# Patient Record
Sex: Male | Born: 1945 | Race: White | Hispanic: No | Marital: Married | Smoking: Former smoker
Health system: Southern US, Community
[De-identification: ages and names within clinical notes are randomized; demographics above are authoritative.]

## PROBLEM LIST (undated history)

## (undated) DIAGNOSIS — N433 Hydrocele, unspecified: Secondary | ICD-10-CM

## (undated) DIAGNOSIS — M1990 Unspecified osteoarthritis, unspecified site: Secondary | ICD-10-CM

## (undated) DIAGNOSIS — D469 Myelodysplastic syndrome, unspecified: Secondary | ICD-10-CM

## (undated) DIAGNOSIS — R011 Cardiac murmur, unspecified: Secondary | ICD-10-CM

## (undated) DIAGNOSIS — S3992XA Unspecified injury of lower back, initial encounter: Secondary | ICD-10-CM

## (undated) DIAGNOSIS — D649 Anemia, unspecified: Secondary | ICD-10-CM

## (undated) DIAGNOSIS — I872 Venous insufficiency (chronic) (peripheral): Secondary | ICD-10-CM

## (undated) HISTORY — PX: SINUS EXPLORATION: SHX5214

## (undated) HISTORY — DX: Venous insufficiency (chronic) (peripheral): I87.2

## (undated) HISTORY — DX: Hydrocele, unspecified: N43.3

## (undated) HISTORY — PX: HERNIA REPAIR: SHX51

---

## 2006-01-26 HISTORY — PX: SHOULDER ARTHROSCOPY: SHX128

## 2006-09-13 ENCOUNTER — Ambulatory Visit: Payer: Self-pay | Admitting: Family Medicine

## 2006-09-13 IMAGING — US US EXTREM LOW VENOUS*L*
1 series · 17 of 24 positions shown · non-contrast
Comparison: none

REASON FOR EXAM: Hard knot left calf, left leg swelling
                    Call report to: [[email or web address]]
COMMENTS:

[Series 1: us extrem low venous*left* · 17 of 35 slices shown]
[im 1/35]
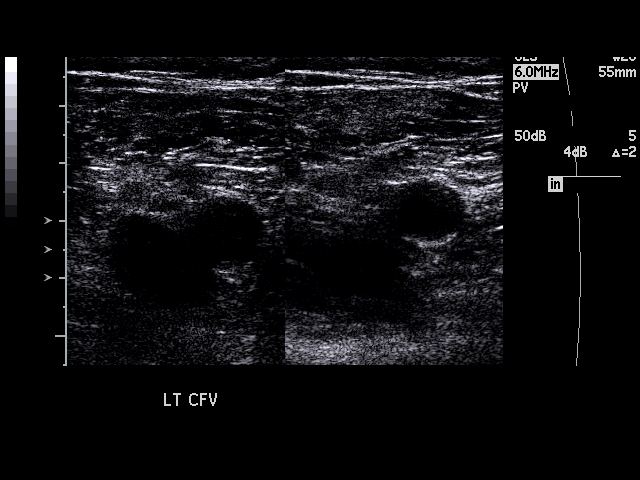
[im 3/35]
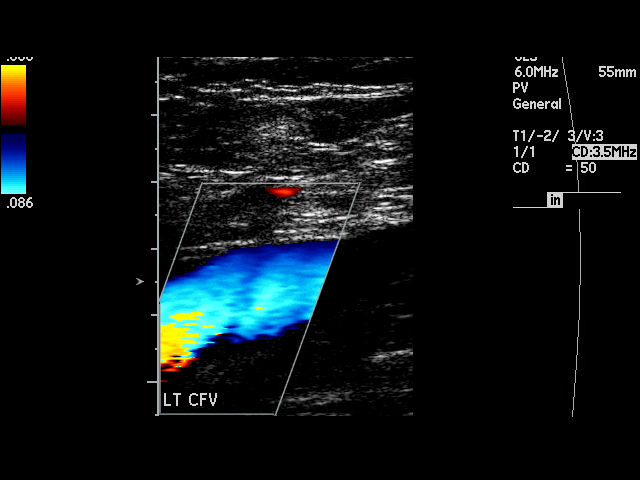
[im 5/35]
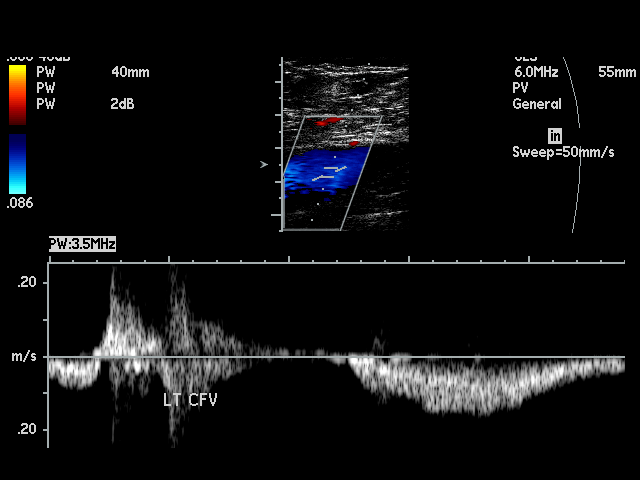
[im 6/35]
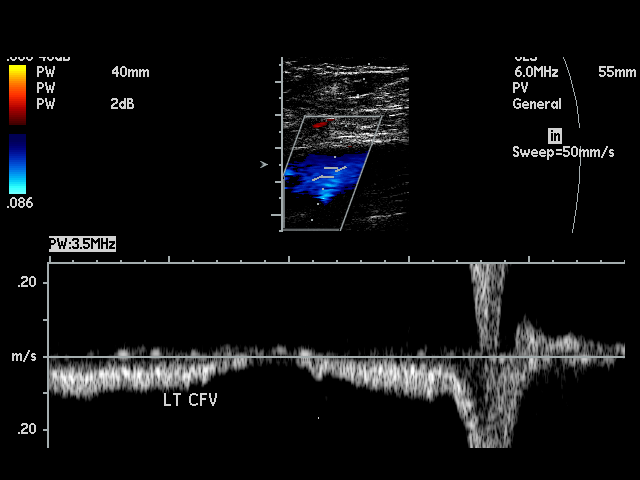
[im 9/35]
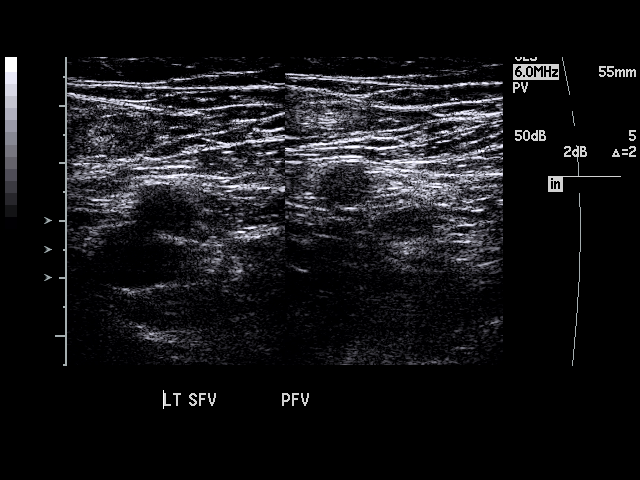
[im 11/35]
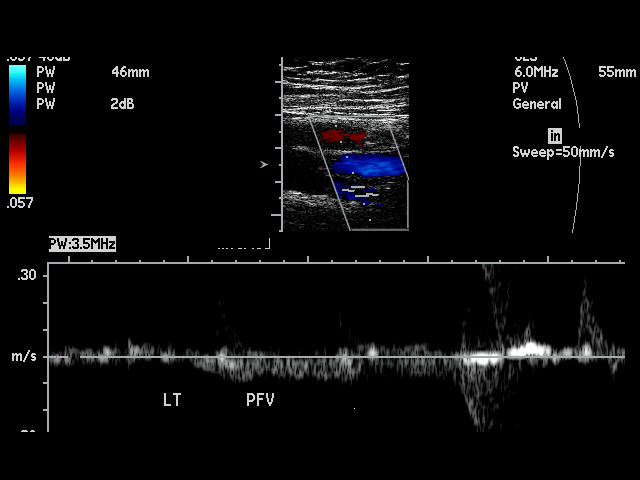
[im 14/35]
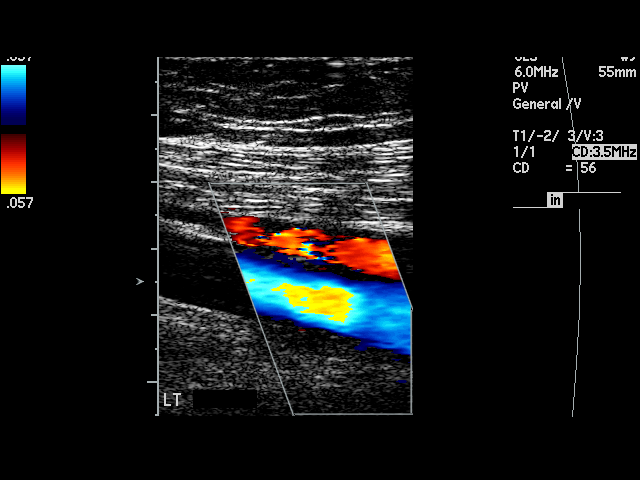
[im 15/35]
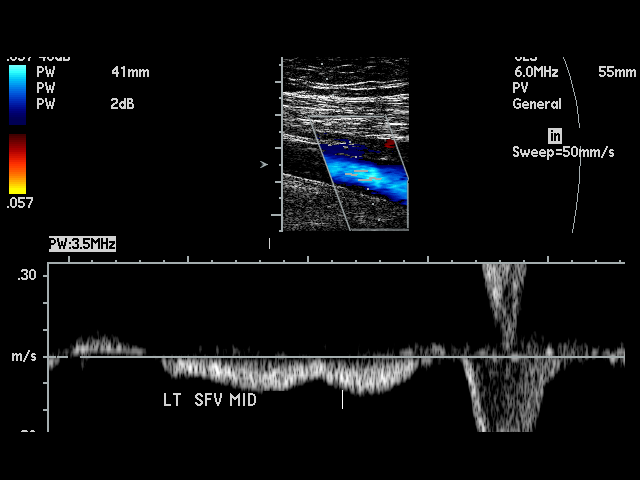
[im 18/35]
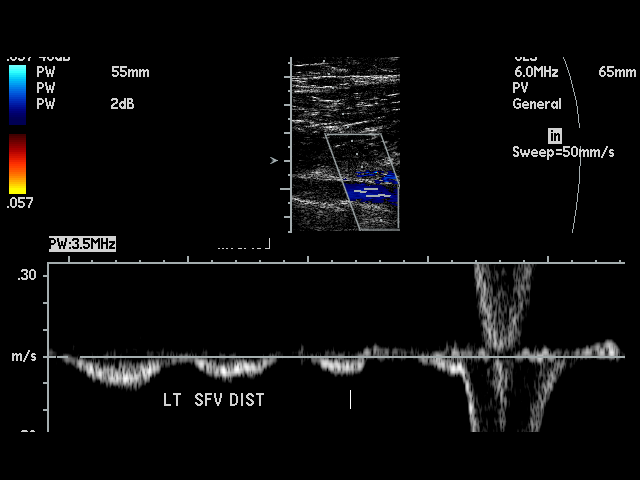
[im 20/35]
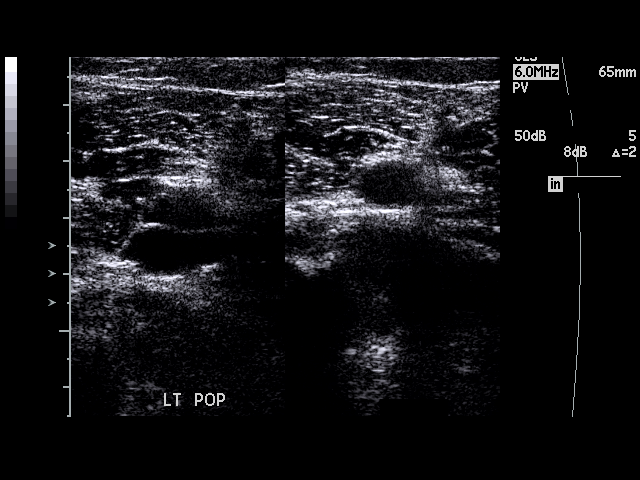
[im 21/35]
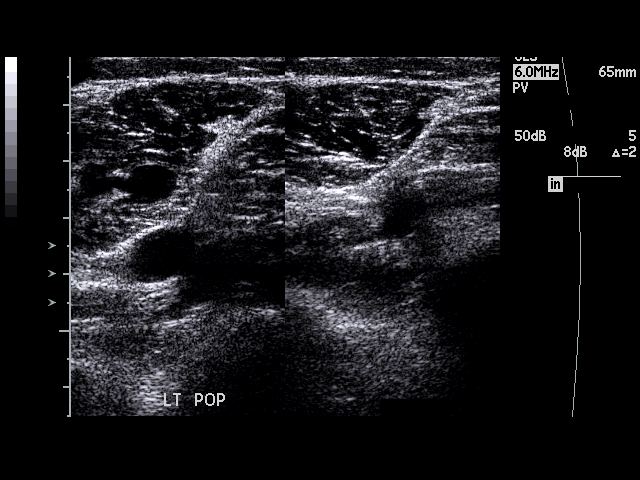
[im 24/35]
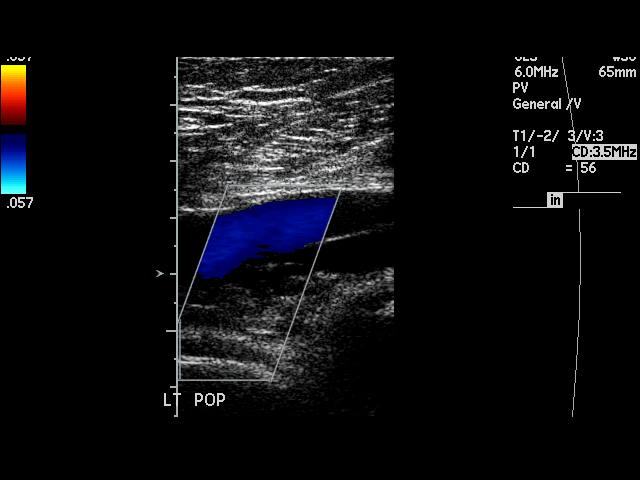
[im 26/35]
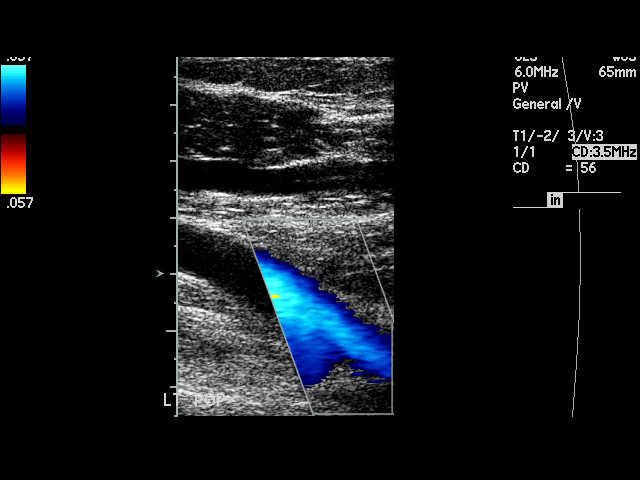
[im 29/35]
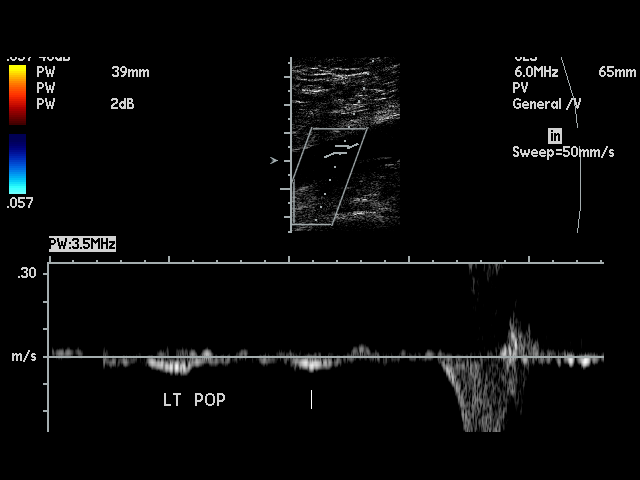
[im 30/35]
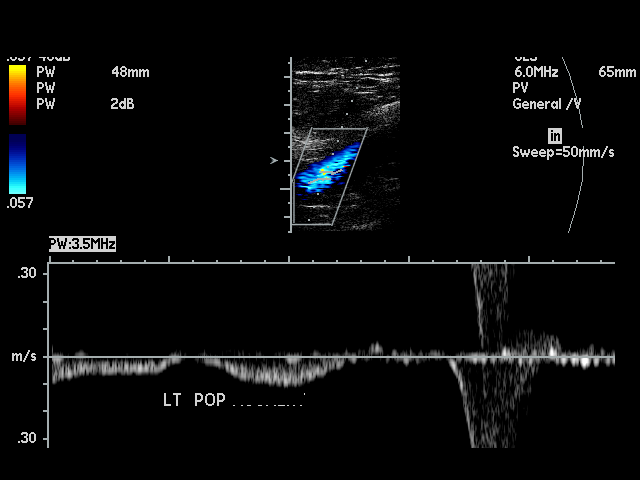
[im 32/35]
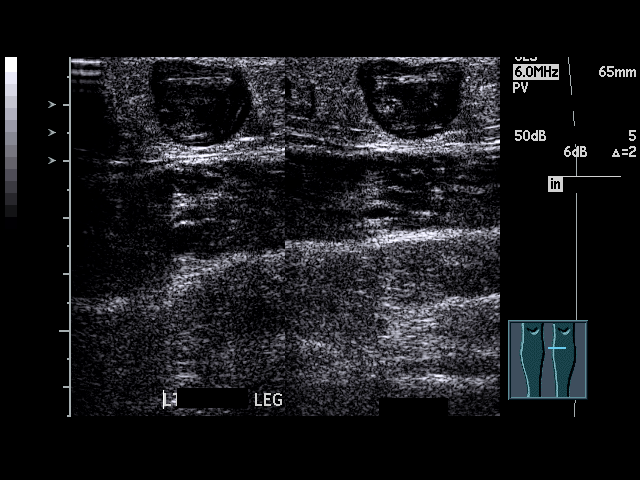
[im 35/35]
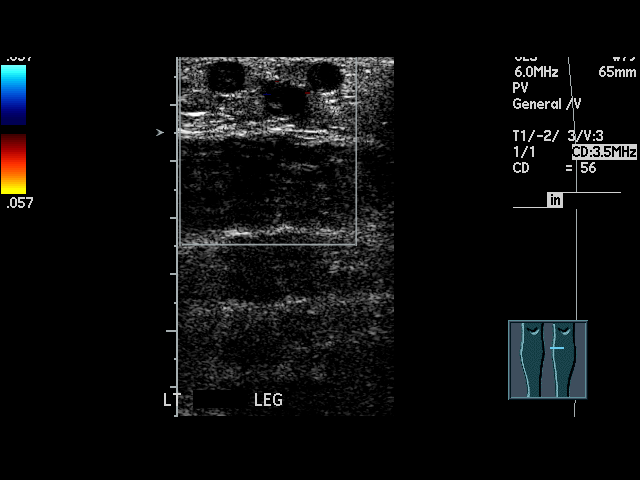

[17 of 24 positions shown; findings below may reference images not displayed]

PROCEDURE:     WILMER RICARDO - WILMER RICARDO DOPPLER LOW EXTR LEFT  - [DATE] [DATE]

RESULT:     Duplex Doppler interrogation of the deep venous system of the
LEFT leg, from the inguinal to the popliteal region, demonstrates the deep
venous structures are fully patent and compressible. The color and spectral
Doppler appearance is normal. There is increased flow with distal
augmentation.

Varicose veins are seen and contain clot in the area of the palpable knot
over the LEFT calf medially.
IMPRESSION: 1.  No evidence of DVT.
2.  Medial LEFT calf superficial varicose veins containing clot.

## 2007-07-18 ENCOUNTER — Ambulatory Visit: Payer: Self-pay | Admitting: Family Medicine

## 2007-07-22 ENCOUNTER — Ambulatory Visit: Payer: Self-pay | Admitting: Family Medicine

## 2007-08-15 ENCOUNTER — Ambulatory Visit: Payer: Self-pay | Admitting: Otolaryngology

## 2011-01-27 HISTORY — PX: COLONOSCOPY: SHX174

## 2014-01-24 DIAGNOSIS — J019 Acute sinusitis, unspecified: Secondary | ICD-10-CM | POA: Diagnosis not present

## 2014-06-18 DIAGNOSIS — K439 Ventral hernia without obstruction or gangrene: Secondary | ICD-10-CM | POA: Diagnosis not present

## 2014-06-27 DIAGNOSIS — M5134 Other intervertebral disc degeneration, thoracic region: Secondary | ICD-10-CM | POA: Diagnosis not present

## 2014-06-28 ENCOUNTER — Ambulatory Visit: Payer: Self-pay | Admitting: Family Medicine

## 2014-07-02 ENCOUNTER — Telehealth: Payer: Self-pay | Admitting: Surgery

## 2014-07-02 NOTE — Telephone Encounter (Signed)
Patient returned call - advised he began taking Diclofemac 75 mg twice daily - he hasn't taken it since 06/30/2014

## 2014-07-02 NOTE — Telephone Encounter (Signed)
Called patient back at this time. No answer. Left VM for return phone call to see what medication change he is speaking of prior to surgery date 07/06/14.

## 2014-07-02 NOTE — Telephone Encounter (Signed)
Pt is having surg and has a change of medication

## 2014-07-03 NOTE — Telephone Encounter (Signed)
Medication placed in patient's chart correctly at this time.

## 2014-07-05 ENCOUNTER — Encounter
Admission: RE | Admit: 2014-07-05 | Discharge: 2014-07-05 | Disposition: A | Discharge status: Home / Self Care | Payer: Medicare Other | Source: Ambulatory Visit | Attending: Surgery | Admitting: Surgery

## 2014-07-05 DIAGNOSIS — Z8249 Family history of ischemic heart disease and other diseases of the circulatory system: Secondary | ICD-10-CM | POA: Diagnosis not present

## 2014-07-05 DIAGNOSIS — K439 Ventral hernia without obstruction or gangrene: Secondary | ICD-10-CM | POA: Diagnosis not present

## 2014-07-05 DIAGNOSIS — Z880 Allergy status to penicillin: Secondary | ICD-10-CM | POA: Diagnosis not present

## 2014-07-05 DIAGNOSIS — Z9889 Other specified postprocedural states: Secondary | ICD-10-CM | POA: Diagnosis not present

## 2014-07-05 DIAGNOSIS — Z833 Family history of diabetes mellitus: Secondary | ICD-10-CM | POA: Diagnosis not present

## 2014-07-05 DIAGNOSIS — Z01818 Encounter for other preprocedural examination: Secondary | ICD-10-CM | POA: Diagnosis not present

## 2014-07-05 DIAGNOSIS — F17200 Nicotine dependence, unspecified, uncomplicated: Secondary | ICD-10-CM | POA: Diagnosis not present

## 2014-07-05 HISTORY — DX: Unspecified injury of lower back, initial encounter: S39.92XA

## 2014-07-05 NOTE — Patient Instructions (Addendum)
  Your procedure is scheduled on: 07/06/14 Report to Day Surgery. At 6:30am Remember: Instructions that are not followed completely may result in serious medical risk, up to and including death, or upon the discretion of your surgeon and anesthesiologist your surgery may need to be rescheduled.    __x__ 1. Do not eat food or drink liquids after midnight. No gum chewing or hard candies.     __x__ 2. No Alcohol for 24 hours before or after surgery.   ____ 3. Bring all medications with you on the day of surgery if instructed.    __x__ 4. Notify your doctor if there is any change in your medical condition     (cold, fever, infections).     Do not wear jewelry, make-up, hairpins, clips or nail polish.  Do not wear lotions, powders, or perfumes. You may wear deodorant.  Do not shave 48 hours prior to surgery. Men may shave face and neck.  Do not bring valuables to the hospital.    [facility] is not responsible for any belongings or valuables.               Contacts, dentures or bridgework may not be worn into surgery.  Leave your suitcase in the car. After surgery it may be brought to your room.  For patients admitted to the hospital, discharge time is determined by your                treatment team.   Patients discharged the day of surgery will not be allowed to drive home.    ____ Take these medicines the morning of surgery with A SIP OF WATER:    1.   2.   3.   4.  5.  6.  ____ Fleet Enema (as directed)   __x_ Use CHG Soap as directed  ____ Use inhalers on the day of surgery  ____ Stop metformin 2 days prior to surgery    ____ Take 1/2 of usual insulin dose the night before surgery and none on the morning of surgery.   ____ Stop Co*umadin/Plavix/aspirin on  _x___ Stop Anti-inflammatories on Voltaren   ____ Stop supplements until after surgery.    ____ Bring C-Pap to the hospital.

## 2014-07-06 ENCOUNTER — Ambulatory Visit: Payer: Medicare Other | Admitting: *Deleted

## 2014-07-06 ENCOUNTER — Ambulatory Visit
Admission: RE | Admit: 2014-07-06 | Discharge: 2014-07-06 | Disposition: A | Discharge status: Home / Self Care | Payer: Medicare Other | Source: Ambulatory Visit | Attending: Surgery | Admitting: Surgery

## 2014-07-06 ENCOUNTER — Encounter: Payer: Self-pay | Admitting: *Deleted

## 2014-07-06 ENCOUNTER — Encounter
Admission: RE | Disposition: A | Discharge status: Home / Self Care | Payer: Self-pay | Source: Ambulatory Visit | Attending: Surgery

## 2014-07-06 DIAGNOSIS — Z9889 Other specified postprocedural states: Secondary | ICD-10-CM | POA: Diagnosis not present

## 2014-07-06 DIAGNOSIS — Z880 Allergy status to penicillin: Secondary | ICD-10-CM | POA: Insufficient documentation

## 2014-07-06 DIAGNOSIS — Z8249 Family history of ischemic heart disease and other diseases of the circulatory system: Secondary | ICD-10-CM | POA: Diagnosis not present

## 2014-07-06 DIAGNOSIS — K439 Ventral hernia without obstruction or gangrene: Secondary | ICD-10-CM | POA: Insufficient documentation

## 2014-07-06 DIAGNOSIS — Z833 Family history of diabetes mellitus: Secondary | ICD-10-CM | POA: Diagnosis not present

## 2014-07-06 DIAGNOSIS — F17200 Nicotine dependence, unspecified, uncomplicated: Secondary | ICD-10-CM | POA: Diagnosis not present

## 2014-07-06 HISTORY — PX: INSERTION OF MESH: SHX5868

## 2014-07-06 HISTORY — PX: EPIGASTRIC HERNIA REPAIR: SHX404

## 2014-07-06 SURGERY — REPAIR, HERNIA, EPIGASTRIC, ADULT
Anesthesia: General | Wound class: Clean

## 2014-07-06 MED ORDER — HYDROCODONE-ACETAMINOPHEN 5-325 MG PO TABS
1.0000 | ORAL_TABLET | Freq: Four times a day (QID) | ORAL | Status: DC | PRN
Start: 2014-07-06 — End: 2014-07-18

## 2014-07-06 MED ORDER — MIDAZOLAM HCL 2 MG/2ML IJ SOLN
INTRAMUSCULAR | Status: DC | PRN
  Administered 2014-07-06: 2 mg via INTRAVENOUS

## 2014-07-06 MED ORDER — FENTANYL CITRATE (PF) 100 MCG/2ML IJ SOLN
INTRAMUSCULAR | Status: DC | PRN
  Administered 2014-07-06: 100 ug via INTRAVENOUS
  Administered 2014-07-06 (×3): 50 ug via INTRAVENOUS

## 2014-07-06 MED ORDER — HYDROCODONE-ACETAMINOPHEN 5-325 MG PO TABS
1.0000 | ORAL_TABLET | Freq: Four times a day (QID) | ORAL | Status: DC | PRN
  Administered 2014-07-06: 1 via ORAL

## 2014-07-06 MED ORDER — ALBUTEROL SULFATE HFA 108 (90 BASE) MCG/ACT IN AERS
INHALATION_SPRAY | RESPIRATORY_TRACT | Status: DC | PRN
  Administered 2014-07-06: 4 via RESPIRATORY_TRACT
  Administered 2014-07-06: 6 via RESPIRATORY_TRACT

## 2014-07-06 MED ORDER — LACTATED RINGERS IV SOLN
INTRAVENOUS | Status: DC
  Administered 2014-07-06 (×2): via INTRAVENOUS
  Administered 2014-07-06: 1000 mL via INTRAVENOUS

## 2014-07-06 MED ORDER — SUGAMMADEX SODIUM 200 MG/2ML IV SOLN
INTRAVENOUS | Status: DC | PRN
  Administered 2014-07-06: 226.8 mg via INTRAVENOUS

## 2014-07-06 MED ORDER — BUPIVACAINE HCL (PF) 0.5 % IJ SOLN
INTRAMUSCULAR | Status: AC
  Filled 2014-07-06: qty 30

## 2014-07-06 MED ORDER — SUCCINYLCHOLINE CHLORIDE 20 MG/ML IJ SOLN
INTRAMUSCULAR | Status: DC | PRN
  Administered 2014-07-06: 100 mg via INTRAVENOUS

## 2014-07-06 MED ORDER — PHENYLEPHRINE HCL 10 MG/ML IJ SOLN
INTRAMUSCULAR | Status: DC | PRN
  Administered 2014-07-06: 100 ug via INTRAVENOUS

## 2014-07-06 MED ORDER — DEXAMETHASONE SODIUM PHOSPHATE 4 MG/ML IJ SOLN
INTRAMUSCULAR | Status: DC | PRN
  Administered 2014-07-06: 10 mg via INTRAVENOUS

## 2014-07-06 MED ORDER — EPHEDRINE SULFATE 50 MG/ML IJ SOLN
INTRAMUSCULAR | Status: DC | PRN
  Administered 2014-07-06: 10 mg via INTRAVENOUS

## 2014-07-06 MED ORDER — HYDROMORPHONE HCL 1 MG/ML IJ SOLN
INTRAMUSCULAR | Status: AC
  Administered 2014-07-06: 0.25 mg via INTRAVENOUS
  Filled 2014-07-06: qty 1

## 2014-07-06 MED ORDER — FAMOTIDINE 20 MG PO TABS
ORAL_TABLET | ORAL | Status: AC
  Filled 2014-07-06: qty 1

## 2014-07-06 MED ORDER — FAMOTIDINE 20 MG PO TABS
20.0000 mg | ORAL_TABLET | Freq: Once | ORAL | Status: AC
  Administered 2014-07-06: 20 mg via ORAL

## 2014-07-06 MED ORDER — ONDANSETRON HCL 4 MG/2ML IJ SOLN
INTRAMUSCULAR | Status: DC | PRN
  Administered 2014-07-06: 4 mg via INTRAVENOUS

## 2014-07-06 MED ORDER — LIDOCAINE HCL (CARDIAC) 20 MG/ML IV SOLN
INTRAVENOUS | Status: DC | PRN
  Administered 2014-07-06: 100 mg via INTRAVENOUS

## 2014-07-06 MED ORDER — HYDROMORPHONE HCL 1 MG/ML IJ SOLN
0.2500 mg | INTRAMUSCULAR | Status: DC | PRN
  Administered 2014-07-06 (×2): 0.25 mg via INTRAVENOUS

## 2014-07-06 MED ORDER — ROCURONIUM BROMIDE 100 MG/10ML IV SOLN
INTRAVENOUS | Status: DC | PRN
  Administered 2014-07-06: 40 mg via INTRAVENOUS
  Administered 2014-07-06: 10 mg via INTRAVENOUS

## 2014-07-06 MED ORDER — HYDROCODONE-ACETAMINOPHEN 5-325 MG PO TABS
ORAL_TABLET | ORAL | Status: AC
  Filled 2014-07-06: qty 1

## 2014-07-06 MED ORDER — CIPROFLOXACIN IN D5W 400 MG/200ML IV SOLN
INTRAVENOUS | Status: AC
  Filled 2014-07-06: qty 200

## 2014-07-06 MED ORDER — DEXMEDETOMIDINE HCL IN NACL 200 MCG/50ML IV SOLN
INTRAVENOUS | Status: DC | PRN
  Administered 2014-07-06: 12 ug via INTRAVENOUS

## 2014-07-06 MED ORDER — LIDOCAINE HCL (PF) 1 % IJ SOLN
INTRAMUSCULAR | Status: AC
  Filled 2014-07-06: qty 30

## 2014-07-06 MED ORDER — PROPOFOL 10 MG/ML IV BOLUS
INTRAVENOUS | Status: DC | PRN
  Administered 2014-07-06: 150 mg via INTRAVENOUS

## 2014-07-06 MED ORDER — ONDANSETRON HCL 4 MG/2ML IJ SOLN: 4.0000 mg | Freq: Once | INTRAMUSCULAR | Status: DC | PRN

## 2014-07-06 MED ORDER — CIPROFLOXACIN IN D5W 400 MG/200ML IV SOLN
400.0000 mg | Freq: Once | INTRAVENOUS | Status: AC
  Administered 2014-07-06: 400 mg via INTRAVENOUS

## 2014-07-06 SURGICAL SUPPLY — 27 items
CANISTER SUCT 1200ML W/VALVE (MISCELLANEOUS) ×2 IMPLANT
CHLORAPREP W/TINT 26ML (MISCELLANEOUS) ×2 IMPLANT
DRAPE LAPAROTOMY 100X77 ABD (DRAPES) ×2 IMPLANT
ELECT CAUTERY BLADE 6.4 (BLADE) ×2 IMPLANT
GAUZE SPONGE NON-WVN 2X2 STRL (MISCELLANEOUS) ×1 IMPLANT
GLOVE BIO SURGEON STRL SZ7.5 (GLOVE) ×4 IMPLANT
GLOVE BIOGEL PI IND STRL 7.0 (GLOVE) ×1 IMPLANT
GLOVE BIOGEL PI INDICATOR 7.0 (GLOVE) ×1
GOWN STRL REUS W/ TWL LRG LVL3 (GOWN DISPOSABLE) ×2 IMPLANT
GOWN STRL REUS W/TWL LRG LVL3 (GOWN DISPOSABLE) ×2
LABEL OR SOLS (LABEL) IMPLANT
MESH VENTRALEX ST 2.5 CRC MED (Mesh General) ×2 IMPLANT
NDL SAFETY 25GX1.5 (NEEDLE) ×2 IMPLANT
NEEDLE HYPO 25X1 1.5 SAFETY (NEEDLE) IMPLANT
NS IRRIG 500ML POUR BTL (IV SOLUTION) ×2 IMPLANT
PACK BASIN MINOR ARMC (MISCELLANEOUS) ×2 IMPLANT
PAD GROUND ADULT SPLIT (MISCELLANEOUS) ×2 IMPLANT
SPONGE LAP 18X18 5 PK (GAUZE/BANDAGES/DRESSINGS) ×2 IMPLANT
SPONGE VERSALON 2X2 STRL (MISCELLANEOUS) ×1
STRAP SAFETY BODY (MISCELLANEOUS) ×2 IMPLANT
STRIP SUTURE WOUND CLOSURE 1/2 (SUTURE) IMPLANT
SUT MNCRL 3 0 RB1 (SUTURE) ×1 IMPLANT
SUT MON AB 5-0 P3 18 (SUTURE) ×2 IMPLANT
SUT MONOCRYL 3 0 RB1 (SUTURE) ×1
SUT PROLENE 0 CT 2 (SUTURE) ×2 IMPLANT
SUT STRIP PLUS 1X5 (SUTURE) ×2 IMPLANT
SYRINGE 10CC LL (SYRINGE) ×2 IMPLANT

## 2014-07-06 NOTE — Progress Notes (Signed)
H&P unchanged. Paper copy to be scanned into the system. The risks of surgery, including mesh, were again reviewed with the patient and family and they agree to proceed.

## 2014-07-06 NOTE — Transfer of Care (Signed)
Immediate Anesthesia Transfer of Care Note  Patient: Brett Boone  Procedure(s) Performed: Procedure(s): HERNIA REPAIR EPIGASTRIC ADULT (N/A) INSERTION OF MESH (N/A)  Patient Location: PACU  Anesthesia Type:General  Level of Consciousness: awake, alert  and oriented  Airway & Oxygen Therapy: Patient Spontanous Breathing and Patient connected to face mask oxygen  Post-op Assessment: Report given to RN and Post -op Vital signs reviewed and stable  Post vital signs: Reviewed and stable  Last Vitals:  Filed Vitals:   07/06/14 0611  BP: 138/86  Pulse: 74  Temp: 36.9 C  Resp: 74    Complications: No apparent anesthesia complications

## 2014-07-06 NOTE — Anesthesia Postprocedure Evaluation (Signed)
  Anesthesia Post-op Note  Patient: Brett Boone  Procedure(s) Performed: Procedure(s): HERNIA REPAIR EPIGASTRIC ADULT (N/A) INSERTION OF MESH (N/A)  Anesthesia type:General  Patient location: PACU  Post pain: Pain level controlled  Post assessment: Post-op Vital signs reviewed, Patient's Cardiovascular Status Stable, Respiratory Function Stable, Patent Airway and No signs of Nausea or vomiting  Post vital signs: Reviewed and stable  Last Vitals:  Filed Vitals:   07/06/14 0925  BP:   Pulse: 70  Temp: 36.4 C  Resp: 10    Level of consciousness: awake, alert  and patient cooperative  Complications: No apparent anesthesia complications

## 2014-07-06 NOTE — Op Note (Signed)
Operative Note  Preoperative Diagnosis: Epigastric hernia  Postoperative Diagnosis: Same (2)  Operation Performed: Epigastric hernia repair with mesh  Surgeon: Laverle Patter., M.D.   Assistant: None  Anesthesia: General Endotracheal  Date of Procedure: 07/06/2014   Procedure in Detail:  The risks (including the possibility of adjacent organ injury, the necessity of converting to an open procedure, and the risk of postoperative infection / abscess), potential benefits, non-surgical treatment options, and expected outcomes were reviewed with the patient. The patient concurred with the proposed plan and agreed to proceed, giving informed consent.   Prior to the induction of anesthesia, antibiotic prophylaxis was administered. The patient was placed supine on the OR table and prepped and draped in the usual sterile fashion.   A limited midline supraumbilical incision was made and carried down through the subcutaneous tissue to 2 separate hernia defects that were bridged with about 1-1/2 cm of fascia. Each hernia sac was dissected free of surrounding subcutaneous tissue and a preperitoneal dissection plane was obtained after they were dunked back within the abdominal wall. A medium ventral ex ST hernia patch was placed in the preperitoneal space and each hernia defect was closed over top of it taking care to take a few bites of the mesh to keep it from slipping. This was done with a #1 Prolene suture. The upper hernia defect was about 12 mm in diameter and the lower hernia defect was just a few millimeters in diameter. The mesh underlay both. The subcutaneous tissue was closed with a running 3-0 Monocryl suture interrupted 3-0 Monocryl suture placed in the subdermal area and the skin was reapproximated with a running subcuticular 5-0 Monocryl and suture strips.  Findings: 2 small epigastric hernia defects separated by 1.5 cm of fascia.          Specimens: None           Complications: None;  the patient tolerated the procedure well.   Consuela Mimes, MD 07/06/2014

## 2014-07-06 NOTE — Anesthesia Preprocedure Evaluation (Signed)
Anesthesia Evaluation  Patient identified by MRN, date of birth, ID band Patient awake    Reviewed: Allergy & Precautions, NPO status   Airway Mallampati: I  TM Distance: >3 FB Neck ROM: Full    Dental  (+) Teeth Intact   Pulmonary Current Smoker,  Pack per day smoker.  Exp. Wheezes.  + wheezing      Cardiovascular Exercise Tolerance: Good Rhythm:Regular     Neuro/Psych    GI/Hepatic   Endo/Other    Renal/GU      Musculoskeletal   Abdominal (+) + obese,   Peds  Hematology   Anesthesia Other Findings   Reproductive/Obstetrics                             Anesthesia Physical Anesthesia Plan  ASA: II  Anesthesia Plan: General   Post-op Pain Management:    Induction: Intravenous  Airway Management Planned: Oral ETT  Additional Equipment:   Intra-op Plan:   Post-operative Plan: Extubation in OR  Informed Consent: I have reviewed the patients History and Physical, chart, labs and discussed the procedure including the risks, benefits and alternatives for the proposed anesthesia with the patient or authorized representative who has indicated his/her understanding and acceptance.     Plan Discussed with: CRNA  Anesthesia Plan Comments:         Anesthesia Quick Evaluation

## 2014-07-06 NOTE — Discharge Instructions (Signed)
AMBULATORY SURGERY  DISCHARGE INSTRUCTIONS   1) The drugs that you were given will stay in your system until tomorrow so for the next 24 hours you should not:  A) Drive an automobile B) Make any legal decisions C) Drink any alcoholic beverage   2) You may resume regular meals tomorrow.  Today it is better to start with liquids and gradually work up to solid foods.  You may eat anything you prefer, but it is better to start with liquids, then soup and crackers, and gradually work up to solid foods.   3) Please notify your doctor immediately if you have any unusual bleeding, trouble breathing, redness and pain at the surgery site, drainage, fever, or pain not relieved by medication. 4)   5) Your post-operative visit with Dr.    George Ina                                 is: Date:                        Time:    Please call to schedule your post-operative visit.  6) Additional Instructions: AMBULATORY SURGERY  DISCHARGE INSTRUCTIONS   The drugs that you were given will stay in your system until tomorrow so for the next 24 hours you should not:  Drive an automobile Make any legal decisions Drink any alcoholic beverage   You may resume regular meals tomorrow.  Today it is better to start with liquids and gradually work up to solid foods.  You may eat anything you prefer, but it is better to start with liquids, then soup and crackers, and gradually work up to solid foods.   Please notify your doctor immediately if you have any unusual bleeding, trouble breathing, redness and pain at the surgery site, drainage, fever, or pain not relieved by medication.   Your post-operative visit with Dr.    George Ina                                 is: Date:                        Time:    Please call to schedule your post-operative visit.  Additional Instructions: Hernia Repair with Laparoscope A hernia occurs when an internal organ pushes out through a weak spot in the belly (abdominal)  wall muscles. Hernias most commonly occur in the groin and around the navel. Hernias can also occur through a cut by the surgeon (incision) after an abdominal operation. A hernia may be caused by: Lifting heavy objects. Prolonged coughing. Straining to move your bowels. Hernias can often be pushed back into place (reduced). Most hernias tend to get worse over time. Problems occur when abdominal contents get stuck in the opening and the blood supply is blocked or impaired (incarcerated hernia). Because of these risks, you require surgery to repair the hernia. Your hernia will be repaired using a laparoscope. Laparoscopic surgery is a type of minimally invasive surgery. It does not involve making a typical surgical cut (incision) in the skin. A laparoscope is a telescope-like rod and lens system. It is usually connected to a video camera and a light source so your caregiver can clearly see the operative area. The instruments are inserted through  to  inch (5 mm  or 10 mm) openings in the skin at specific locations. A working and viewing space is created by blowing a small amount of carbon dioxide gas into the abdominal cavity. The abdomen is essentially blown up like a balloon (insufflated). This elevates the abdominal wall above the internal organs like a dome. The carbon dioxide gas is common to the human body and can be absorbed by tissue and removed by the respiratory system. Once the repair is completed, the small incisions will be closed with either stitches (sutures) or staples (just like a paper stapler only this staple holds the skin together). LET YOUR CAREGIVERS KNOW ABOUT: Allergies. Medications taken including herbs, eye drops, over the counter medications, and creams. Use of steroids (by mouth or creams). Previous problems with anesthetics or Novocaine. Possibility of pregnancy, if this applies. History of blood clots (thrombophlebitis). History of bleeding or blood problems. Previous  surgery. Other health problems. BEFORE THE PROCEDURE  Laparoscopy can be done either in a hospital or out-patient clinic. You may be given a mild sedative to help you relax before the procedure. Once in the operating room, you will be given a general anesthesia to make you sleep (unless you and your caregiver choose a different anesthetic).  AFTER THE PROCEDURE  After the procedure you will be watched in a recovery area. Depending on what type of hernia was repaired, you might be admitted to the hospital or you might go home the same day. With this procedure you may have less pain and scarring. This usually results in a quicker recovery and less risk of infection. HOME CARE INSTRUCTIONS  Bed rest is not required. You may continue your normal activities but avoid heavy lifting (more than 10 pounds) or straining. Cough gently. If you are a smoker it is best to stop, as even the best hernia repair can break down with the continual strain of coughing. Avoid driving until given the OK by your surgeon. There are no dietary restrictions unless given otherwise. TAKE ALL MEDICATIONS AS DIRECTED. Only take over-the-counter or prescription medicines for pain, discomfort, or fever as directed by your caregiver. SEEK MEDICAL CARE IF:  There is increasing abdominal pain or pain in your incisions. There is more bleeding from incisions, other than minimal spotting. You feel light headed or faint. You develop an unexplained fever, chills, and/or an oral temperature above 102 F (38.9 C). You have redness, swelling, or increasing pain in the wound. Pus coming from wound. A foul smell coming from the wound or dressings. SEEK IMMEDIATE MEDICAL CARE IF:  You develop a rash. You have difficulty breathing. You have any allergic problems. MAKE SURE YOU:  Understand these instructions. Will watch your condition. Will get help right away if you are not doing well or get worse. Document Released: 01/12/2005  Document Revised: 04/06/2011 Document Reviewed: 12/12/2008 [facility] Patient Information 2015 [place], [place]. This information is not intended to replace advice given to you by your health care provider. Make sure you discuss any questions you have with your health care provider.

## 2014-07-18 ENCOUNTER — Ambulatory Visit (INDEPENDENT_AMBULATORY_CARE_PROVIDER_SITE_OTHER): Payer: Medicare Other | Admitting: Surgery

## 2014-07-18 ENCOUNTER — Encounter: Payer: Self-pay | Admitting: Surgery

## 2014-07-18 VITALS — BP 158/99 | HR 98 | Temp 97.8°F | Ht 71.0 in | Wt 258.6 lb

## 2014-07-18 DIAGNOSIS — K439 Ventral hernia without obstruction or gangrene: Secondary | ICD-10-CM | POA: Insufficient documentation

## 2014-07-18 NOTE — Progress Notes (Signed)
Subjective:     Patient ID: Brett Boone, male   DOB: 05-07-1945, 69 y.o.   MRN: [id number]  HPI 69 year old male status post epigastric hernia repair with mesh on June 10. He is doing well. His first postoperative visit. He is having no pain.  Review of Systems Negative    Objective:   Physical Exam    abdomen is soft and nontender incision above the umbilicus in the midline is well-healed. There is no signs of infection. Assessment:     Doing well nearly 2 weeks status post epigastric hernia repair.    Plan:     He may increase his activity and 1 more week. We will see him back in the office as needed.

## 2014-07-18 NOTE — Patient Instructions (Signed)
Follow-up as needed. Please call our office with any questions or concerns. 

## 2014-07-25 ENCOUNTER — Telehealth: Payer: Self-pay | Admitting: Surgery

## 2014-07-25 NOTE — Telephone Encounter (Signed)
Return patient call. Patient reports that a clear stitch is sticking out of his incision and is irritating the skin around the stitch. Patient also reported that the incision site was healing well with no pain or fever.  I informed patient that sometimes a suture will come to the surface and not to be concerned. Directed patient to cover with a band aid to reduce clothing irritation until suture breaks off by itself. Patient confirmed understanding of information.

## 2014-07-25 NOTE — Telephone Encounter (Signed)
Pt was a little concerned about a stitch that has no dissolved and becoming irritated please call pt

## 2014-08-29 ENCOUNTER — Encounter: Payer: Self-pay | Admitting: Family Medicine

## 2014-08-30 ENCOUNTER — Ambulatory Visit (INDEPENDENT_AMBULATORY_CARE_PROVIDER_SITE_OTHER): Payer: Medicare Other | Admitting: Family Medicine

## 2014-08-30 ENCOUNTER — Encounter: Payer: Self-pay | Admitting: Family Medicine

## 2014-08-30 VITALS — BP 130/80 | HR 80 | Ht 71.0 in | Wt 258.0 lb

## 2014-08-30 DIAGNOSIS — R739 Hyperglycemia, unspecified: Secondary | ICD-10-CM | POA: Diagnosis not present

## 2014-08-30 DIAGNOSIS — Z0000 Encounter for general adult medical examination without abnormal findings: Secondary | ICD-10-CM | POA: Diagnosis not present

## 2014-08-30 DIAGNOSIS — Z23 Encounter for immunization: Secondary | ICD-10-CM | POA: Diagnosis not present

## 2014-08-30 LAB — HEMOCCULT GUIAC POC 1CARD (OFFICE): Fecal Occult Blood, POC: NEGATIVE

## 2014-08-30 LAB — POCT URINALYSIS DIPSTICK
Bilirubin, UA: NEGATIVE
Blood, UA: NEGATIVE
Glucose, UA: NEGATIVE
Ketones, UA: NEGATIVE
Leukocytes, UA: NEGATIVE
Nitrite, UA: NEGATIVE
Protein, UA: NEGATIVE
Spec Grav, UA: 1.01
Urobilinogen, UA: 0.2
pH, UA: 5

## 2014-08-30 NOTE — Progress Notes (Signed)
Name: Brett Boone   MRN: [id number]    DOB: 04-Mar-1945   Date:08/30/2014       Progress Note  Subjective  Chief Complaint  Chief Complaint  Patient presents with  . Annual Exam  . Peripheral Neuropathy    a burning feeling in feet at night in bed    HPI Comments: Reviewed subjective concerns with no complaints.   No problem-specific assessment & plan notes found for this encounter.   Past Medical History  Diagnosis Date  . Back injury     Past Surgical History  Procedure Laterality Date  . Hernia repair Bilateral   . Sinus exploration    . Shoulder arthroscopy Left 2008  . Epigastric hernia repair N/A 07/06/2014    Procedure: HERNIA REPAIR EPIGASTRIC ADULT;  Surgeon: Molly Maduro, MD;  Location: [facility] ORS;  Service: General;  Laterality: N/A;  . Insertion of mesh N/A 07/06/2014    Procedure: INSERTION OF MESH;  Surgeon: Molly Maduro, MD;  Location: [facility] ORS;  Service: General;  Laterality: N/A;  . Colonoscopy  2013    normal- cleared for 5 yrs- Dr Beckey Downing    Family History  Problem Relation Age of Onset  . Diabetes Father   . Heart disease Father 72    History   Social History  . Marital Status: Married    Spouse Name: N/A  . Number of Children: N/A  . Years of Education: N/A   Occupational History  . Not on file.   Social History Main Topics  . Smoking status: Current Some Day Smoker -- 0.50 packs/day for 30 years  . Smokeless tobacco: Former Systems developer  . Alcohol Use: Yes     Comment: Occasional  . Drug Use: No  . Sexual Activity: Yes   Other Topics Concern  . Not on file   Social History Narrative    Allergies  Allergen Reactions  . Augmentin [Amoxicillin-Pot Clavulanate] Hives and Nausea And Vomiting     Review of Systems  Constitutional: Negative for fever, chills, weight loss and malaise/fatigue.  HENT: Negative for ear discharge, ear pain and sore throat.   Eyes: Negative for blurred vision.  Respiratory: Negative for  cough, sputum production, shortness of breath and wheezing.   Cardiovascular: Negative for chest pain, palpitations and leg swelling.  Gastrointestinal: Negative for heartburn, nausea, abdominal pain, diarrhea, constipation, blood in stool and melena.  Genitourinary: Negative for dysuria, urgency, frequency and hematuria.  Musculoskeletal: Negative for myalgias, back pain, joint pain and neck pain.  Skin: Negative for rash.  Neurological: Negative for dizziness, tingling, sensory change, focal weakness and headaches.  Endo/Heme/Allergies: Negative for environmental allergies and polydipsia. Does not bruise/bleed easily.  Psychiatric/Behavioral: Negative for depression and suicidal ideas. The patient is not nervous/anxious and does not have insomnia.      Objective  Filed Vitals:   08/30/14 0850  BP: 130/80  Pulse: 80  Height: 5\' 11"  (1.803 m)  Weight: 258 lb (117.028 kg)    Physical Exam  Constitutional: He is oriented to person, place, and time and well-developed, well-nourished, and in no distress.  HENT:  Head: Normocephalic.  Right Ear: External ear normal.  Left Ear: External ear normal.  Nose: Nose normal.  Mouth/Throat: Oropharynx is clear and moist.  Eyes: Conjunctivae and EOM are normal. Pupils are equal, round, and reactive to light. Right eye exhibits no discharge. Left eye exhibits no discharge. No scleral icterus.  Neck: Normal range of motion. Neck supple. No JVD present. No tracheal  deviation present. No thyromegaly present.  Cardiovascular: Normal rate, regular rhythm, S1 normal, S2 normal and intact distal pulses.  Exam reveals no gallop and no friction rub.   Murmur heard.  Systolic murmur is present with a grade of 1/6  Pulmonary/Chest: Breath sounds normal. No respiratory distress. He has no wheezes. He has no rales.  Abdominal: Soft. Bowel sounds are normal. He exhibits no mass. There is no hepatosplenomegaly. There is no tenderness. There is no rebound, no  guarding and no CVA tenderness.  Genitourinary: Rectum normal, prostate normal and penis normal. He exhibits abnormal scrotal mass.  Right hydrocoel  Musculoskeletal: Normal range of motion. He exhibits no edema or tenderness.  Lymphadenopathy:    He has no cervical adenopathy.  Neurological: He is alert and oriented to person, place, and time. He has normal sensation, normal strength, normal reflexes and intact cranial nerves. No cranial nerve deficit.  Skin: Skin is warm. No rash noted.  Psychiatric: Mood and affect normal.      Assessment & Plan  Problem List Items Addressed This Visit      Other   Annual physical exam - Primary        Dr. Otilio Miu [facility]  08/30/2014

## 2014-08-31 LAB — RENAL FUNCTION PANEL
Albumin: 4.5 g/dL (ref 3.6–4.8)
BUN/Creatinine Ratio: 14 (ref 10–22)
BUN: 14 mg/dL (ref 8–27)
CO2: 25 mmol/L (ref 18–29)
Calcium: 9.6 mg/dL (ref 8.6–10.2)
Chloride: 99 mmol/L (ref 97–108)
Creatinine, Ser: 1 mg/dL (ref 0.76–1.27)
GFR calc Af Amer: 90 mL/min/{1.73_m2} (ref >59)
GFR calc non Af Amer: 78 mL/min/{1.73_m2} (ref >59)
Glucose: 103 mg/dL — ABNORMAL HIGH (ref 65–99)
Phosphorus: 3.4 mg/dL (ref 2.5–4.5)
Potassium: 4.8 mmol/L (ref 3.5–5.2)
Sodium: 140 mmol/L (ref 134–144)

## 2014-08-31 LAB — LIPID PANEL
Chol/HDL Ratio: 1.9 ratio units (ref 0.0–5.0)
Cholesterol, Total: 81 mg/dL — ABNORMAL LOW (ref 100–199)
HDL: 42 mg/dL (ref >39)
LDL Calculated: 27 mg/dL (ref 0–99)
Triglycerides: 58 mg/dL (ref 0–149)
VLDL Cholesterol Cal: 12 mg/dL (ref 5–40)

## 2014-08-31 LAB — HEMOGLOBIN A1C
Est. average glucose Bld gHb Est-mCnc: 120 mg/dL
Hgb A1c MFr Bld: 5.8 % — ABNORMAL HIGH (ref 4.8–5.6)

## 2014-08-31 LAB — PSA: Prostate Specific Ag, Serum: 3.1 ng/mL (ref 0.0–4.0)

## 2014-09-03 DIAGNOSIS — R0602 Shortness of breath: Secondary | ICD-10-CM | POA: Diagnosis not present

## 2014-09-03 DIAGNOSIS — I350 Nonrheumatic aortic (valve) stenosis: Secondary | ICD-10-CM | POA: Diagnosis not present

## 2014-09-18 DIAGNOSIS — I119 Hypertensive heart disease without heart failure: Secondary | ICD-10-CM | POA: Diagnosis not present

## 2014-09-18 DIAGNOSIS — I38 Endocarditis, valve unspecified: Secondary | ICD-10-CM | POA: Diagnosis not present

## 2014-09-18 DIAGNOSIS — I350 Nonrheumatic aortic (valve) stenosis: Secondary | ICD-10-CM | POA: Diagnosis not present

## 2015-01-24 ENCOUNTER — Ambulatory Visit (INDEPENDENT_AMBULATORY_CARE_PROVIDER_SITE_OTHER): Payer: Medicare Other | Admitting: Family Medicine

## 2015-01-24 ENCOUNTER — Encounter: Payer: Self-pay | Admitting: Family Medicine

## 2015-01-24 VITALS — BP 120/80 | HR 64 | Ht 71.0 in | Wt 255.0 lb

## 2015-01-24 DIAGNOSIS — J0100 Acute maxillary sinusitis, unspecified: Secondary | ICD-10-CM | POA: Diagnosis not present

## 2015-01-24 DIAGNOSIS — J40 Bronchitis, not specified as acute or chronic: Secondary | ICD-10-CM | POA: Diagnosis not present

## 2015-01-24 MED ORDER — DOXYCYCLINE HYCLATE 100 MG PO TABS: 100.0000 mg | ORAL_TABLET | Freq: Two times a day (BID) | ORAL | Status: DC

## 2015-01-24 MED ORDER — GUAIFENESIN-CODEINE 100-10 MG/5ML PO SOLN: 5.0000 mL | Freq: Three times a day (TID) | ORAL | Status: DC | PRN

## 2015-01-24 NOTE — Patient Instructions (Signed)

## 2015-01-24 NOTE — Progress Notes (Signed)
Name: Brett Boone   MRN: [id number]    DOB: 02-21-1945   Date:01/24/2015       Progress Note  Subjective  Chief Complaint  Chief Complaint  Patient presents with  . Sinusitis    cough and cong- taking sudafed, no relief    Sinusitis This is a new problem. The current episode started in the past 7 days. The problem has been waxing and waning since onset. There has been no fever. The fever has been present for 1 to 2 days. The pain is mild. Associated symptoms include congestion, coughing, a hoarse voice, neck pain, sinus pressure and sneezing. Pertinent negatives include no chills, diaphoresis, ear pain, headaches, shortness of breath, sore throat or swollen glands. Past treatments include acetaminophen and oral decongestants. The treatment provided mild relief.  Cough This is a new problem. The current episode started in the past 7 days. The problem has been gradually worsening. The cough is productive of purulent sputum. Associated symptoms include nasal congestion. Pertinent negatives include no chest pain, chills, ear pain, fever, headaches, heartburn, myalgias, rash, sore throat, shortness of breath, weight loss or wheezing. He has tried nothing for the symptoms. There is no history of asthma, bronchitis, COPD or environmental allergies.    No problem-specific assessment & plan notes found for this encounter.   Past Medical History  Diagnosis Date  . Back injury     Past Surgical History  Procedure Laterality Date  . Hernia repair Bilateral   . Sinus exploration    . Shoulder arthroscopy Left 2008  . Epigastric hernia repair N/A 07/06/2014    Procedure: HERNIA REPAIR EPIGASTRIC ADULT;  Surgeon: Molly Maduro, MD;  Location: [facility] ORS;  Service: General;  Laterality: N/A;  . Insertion of mesh N/A 07/06/2014    Procedure: INSERTION OF MESH;  Surgeon: Molly Maduro, MD;  Location: [facility] ORS;  Service: General;  Laterality: N/A;  . Colonoscopy  2013    normal- cleared  for 5 yrs- Dr Beckey Downing    Family History  Problem Relation Age of Onset  . Diabetes Father   . Heart disease Father 53    Social History   Social History  . Marital Status: Married    Spouse Name: N/A  . Number of Children: N/A  . Years of Education: N/A   Occupational History  . Not on file.   Social History Main Topics  . Smoking status: Current Some Day Smoker -- 0.50 packs/day for 30 years  . Smokeless tobacco: Former Systems developer  . Alcohol Use: Yes     Comment: Occasional  . Drug Use: No  . Sexual Activity: Yes   Other Topics Concern  . Not on file   Social History Narrative    Allergies  Allergen Reactions  . Augmentin [Amoxicillin-Pot Clavulanate] Hives and Nausea And Vomiting     Review of Systems  Constitutional: Negative for fever, chills, weight loss, malaise/fatigue and diaphoresis.  HENT: Positive for congestion, hoarse voice, sinus pressure and sneezing. Negative for ear discharge, ear pain and sore throat.   Eyes: Negative for blurred vision.  Respiratory: Positive for cough. Negative for sputum production, shortness of breath and wheezing.   Cardiovascular: Negative for chest pain, palpitations and leg swelling.  Gastrointestinal: Negative for heartburn, nausea, abdominal pain, diarrhea, constipation, blood in stool and melena.  Genitourinary: Negative for dysuria, urgency, frequency and hematuria.  Musculoskeletal: Positive for neck pain. Negative for myalgias, back pain and joint pain.  Skin: Negative for rash.  Neurological:  Negative for dizziness, tingling, sensory change, focal weakness and headaches.  Endo/Heme/Allergies: Negative for environmental allergies and polydipsia. Does not bruise/bleed easily.  Psychiatric/Behavioral: Negative for depression and suicidal ideas. The patient is not nervous/anxious and does not have insomnia.      Objective  Filed Vitals:   01/24/15 0814  BP: 120/80  Pulse: 64  Height: 5\' 11"  (1.803 m)  Weight: 255  lb (115.667 kg)    Physical Exam  Constitutional: He is oriented to person, place, and time and well-developed, well-nourished, and in no distress.  HENT:  Head: Normocephalic.  Right Ear: External ear normal.  Left Ear: External ear normal.  Nose: Nose normal.  Mouth/Throat: Oropharynx is clear and moist.  Eyes: Conjunctivae and EOM are normal. Pupils are equal, round, and reactive to light. Right eye exhibits no discharge. Left eye exhibits no discharge. No scleral icterus.  Neck: Normal range of motion. Neck supple. No JVD present. No tracheal deviation present. No thyromegaly present.  Cardiovascular: Normal rate, regular rhythm, normal heart sounds and intact distal pulses.  Exam reveals no gallop and no friction rub.   No murmur heard. Pulmonary/Chest: Breath sounds normal. No respiratory distress. He has no wheezes. He has no rales.  Abdominal: Soft. Bowel sounds are normal. He exhibits no mass. There is no hepatosplenomegaly. There is no tenderness. There is no rebound, no guarding and no CVA tenderness.  Musculoskeletal: Normal range of motion. He exhibits no edema or tenderness.  Lymphadenopathy:    He has no cervical adenopathy.  Neurological: He is alert and oriented to person, place, and time. He has normal sensation, normal strength, normal reflexes and intact cranial nerves. No cranial nerve deficit.  Skin: Skin is warm. No rash noted.  Psychiatric: Mood and affect normal.      Assessment & Plan  Problem List Items Addressed This Visit    None    Visit Diagnoses    Acute maxillary sinusitis, recurrence not specified    -  Primary    Relevant Medications    doxycycline (VIBRA-TABS) 100 MG tablet    guaiFENesin-codeine 100-10 MG/5ML syrup    Bronchitis        Relevant Medications    doxycycline (VIBRA-TABS) 100 MG tablet    guaiFENesin-codeine 100-10 MG/5ML syrup         Dr. Macon [facility]  01/24/2015

## 2015-01-30 ENCOUNTER — Other Ambulatory Visit: Payer: Self-pay

## 2015-01-30 DIAGNOSIS — J40 Bronchitis, not specified as acute or chronic: Secondary | ICD-10-CM

## 2015-01-30 MED ORDER — LEVOFLOXACIN 500 MG PO TABS: 500.0000 mg | ORAL_TABLET | Freq: Every day | ORAL | Status: DC

## 2015-09-26 ENCOUNTER — Other Ambulatory Visit: Payer: Self-pay

## 2015-10-22 ENCOUNTER — Encounter: Payer: Self-pay | Admitting: Family Medicine

## 2015-10-22 ENCOUNTER — Ambulatory Visit (INDEPENDENT_AMBULATORY_CARE_PROVIDER_SITE_OTHER): Payer: Medicare Other | Admitting: Family Medicine

## 2015-10-22 VITALS — BP 120/68 | HR 72 | Ht 70.0 in | Wt 256.0 lb

## 2015-10-22 DIAGNOSIS — E669 Obesity, unspecified: Secondary | ICD-10-CM

## 2015-10-22 DIAGNOSIS — R351 Nocturia: Secondary | ICD-10-CM

## 2015-10-22 DIAGNOSIS — R3911 Hesitancy of micturition: Secondary | ICD-10-CM | POA: Diagnosis not present

## 2015-10-22 DIAGNOSIS — Z23 Encounter for immunization: Secondary | ICD-10-CM | POA: Diagnosis not present

## 2015-10-22 DIAGNOSIS — N433 Hydrocele, unspecified: Secondary | ICD-10-CM

## 2015-10-22 DIAGNOSIS — Z0000 Encounter for general adult medical examination without abnormal findings: Secondary | ICD-10-CM

## 2015-10-22 NOTE — Progress Notes (Signed)
Name: Brett Boone   MRN: [id number]    DOB: 19-Jun-1945   Date:10/22/2015       Progress Note  Subjective  Chief Complaint  Chief Complaint  Patient presents with  . Annual Exam    Dr Bernardo Heater needs a note on his hydrocele    Patient presents for annual physical exam.    No problem-specific Assessment & Plan notes found for this encounter.   Past Medical History:  Diagnosis Date  . Back injury     Past Surgical History:  Procedure Laterality Date  . COLONOSCOPY  2013   normal- cleared for 5 yrs- Dr Beckey Downing  . EPIGASTRIC HERNIA REPAIR N/A 07/06/2014   Procedure: HERNIA REPAIR EPIGASTRIC ADULT;  Surgeon: Molly Maduro, MD;  Location: [facility] ORS;  Service: General;  Laterality: N/A;  . HERNIA REPAIR Bilateral   . INSERTION OF MESH N/A 07/06/2014   Procedure: INSERTION OF MESH;  Surgeon: Molly Maduro, MD;  Location: [facility] ORS;  Service: General;  Laterality: N/A;  . SHOULDER ARTHROSCOPY Left 2008  . SINUS EXPLORATION      Family History  Problem Relation Age of Onset  . Diabetes Father   . Heart disease Father 74    Social History   Social History  . Marital status: Married    Spouse name: N/A  . Number of children: N/A  . Years of education: N/A   Occupational History  . Not on file.   Social History Main Topics  . Smoking status: Current Some Day Smoker    Packs/day: 0.50    Years: 30.00  . Smokeless tobacco: Former Systems developer  . Alcohol use Yes     Comment: Occasional  . Drug use: No  . Sexual activity: Yes   Other Topics Concern  . Not on file   Social History Narrative  . No narrative on file    Allergies  Allergen Reactions  . Augmentin [Amoxicillin-Pot Clavulanate] Hives and Nausea And Vomiting     Review of Systems  Constitutional: Negative for chills, fever, malaise/fatigue and weight loss.  HENT: Negative for ear discharge, ear pain and sore throat.   Eyes: Negative for blurred vision.  Respiratory: Negative for cough, sputum  production, shortness of breath and wheezing.   Cardiovascular: Negative for chest pain, palpitations and leg swelling.  Gastrointestinal: Negative for abdominal pain, blood in stool, constipation, diarrhea, heartburn, melena and nausea.  Genitourinary: Negative for dysuria, frequency, hematuria and urgency.       Patient has noted some hesitancy ,and has a history of hydrocoel.   Musculoskeletal: Negative for back pain, joint pain, myalgias and neck pain.  Skin: Negative for rash.  Neurological: Negative for dizziness, tingling, sensory change, focal weakness and headaches.  Endo/Heme/Allergies: Negative for environmental allergies and polydipsia. Does not bruise/bleed easily.  Psychiatric/Behavioral: Negative for depression and suicidal ideas. The patient is not nervous/anxious and does not have insomnia.      Objective  Vitals:   10/22/15 0833  BP: 120/68  Pulse: 72  Weight: 256 lb (116.1 kg)  Height: 5\' 10"  (1.778 m)    Physical Exam  Constitutional: He is oriented to person, place, and time and well-developed, well-nourished, and in no distress.  HENT:  Head: Normocephalic.  Right Ear: Tympanic membrane, external ear and ear canal normal.  Left Ear: Tympanic membrane, external ear and ear canal normal.  Nose: Nose normal.  Mouth/Throat: Oropharynx is clear and moist and mucous membranes are normal.  Eyes: Conjunctivae and EOM are normal.  Pupils are equal, round, and reactive to light. Right eye exhibits no discharge. Left eye exhibits no discharge. No scleral icterus.  Fundoscopic exam:      The right eye shows no arteriolar narrowing, no AV nicking and no papilledema.       The left eye shows no arteriolar narrowing, no AV nicking and no papilledema.  Neck: Trachea normal and normal range of motion. Neck supple. Normal carotid pulses, no hepatojugular reflux and no JVD present. Carotid bruit is not present. No tracheal deviation present. No thyromegaly present.   Cardiovascular: Normal rate, regular rhythm, S1 normal, S2 normal, normal heart sounds and intact distal pulses.  Exam reveals no gallop, no S3, no S4 and no friction rub.   No murmur heard. Pulmonary/Chest: Effort normal and breath sounds normal. No respiratory distress. He has no wheezes. He has no rales. Right breast exhibits no mass. Left breast exhibits no mass.  Abdominal: Soft. Normal aorta and bowel sounds are normal. He exhibits no mass. There is no hepatosplenomegaly. There is no tenderness. There is no rebound, no guarding and no CVA tenderness.  Genitourinary: Rectum normal, prostate normal and penis normal. He exhibits abnormal scrotal mass. He exhibits no abnormal testicular mass, no testicular tenderness and no scrotal tenderness.  Genitourinary Comments: Right hydrocoel/illuminates  Musculoskeletal: Normal range of motion. He exhibits no edema or tenderness.  Lymphadenopathy:       Head (right side): No submental and no submandibular adenopathy present.       Head (left side): No submental and no submandibular adenopathy present.    He has no cervical adenopathy.    He has no axillary adenopathy.  Neurological: He is alert and oriented to person, place, and time. He has normal sensation, normal strength, normal reflexes and intact cranial nerves. No cranial nerve deficit.  Skin: Skin is warm, dry and intact. No rash noted.  Psychiatric: Mood and affect normal.  Nursing note and vitals reviewed.     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 Otilio Miu [facility]  10/22/15

## 2015-10-23 LAB — LIPID PANEL
Chol/HDL Ratio: 2 ratio units (ref 0.0–5.0)
Cholesterol, Total: 74 mg/dL — ABNORMAL LOW (ref 100–199)
HDL: 37 mg/dL — ABNORMAL LOW (ref >39)
LDL Calculated: 25 mg/dL (ref 0–99)
Triglycerides: 58 mg/dL (ref 0–149)
VLDL Cholesterol Cal: 12 mg/dL (ref 5–40)

## 2015-10-23 LAB — RENAL FUNCTION PANEL
Albumin: 4 g/dL (ref 3.6–4.8)
BUN/Creatinine Ratio: 13 (ref 10–24)
BUN: 14 mg/dL (ref 8–27)
CO2: 26 mmol/L (ref 18–29)
Calcium: 8.8 mg/dL (ref 8.6–10.2)
Chloride: 101 mmol/L (ref 96–106)
Creatinine, Ser: 1.04 mg/dL (ref 0.76–1.27)
GFR calc Af Amer: 85 mL/min/{1.73_m2} (ref >59)
GFR calc non Af Amer: 73 mL/min/{1.73_m2} (ref >59)
Glucose: 97 mg/dL (ref 65–99)
Phosphorus: 3 mg/dL (ref 2.5–4.5)
Potassium: 4.7 mmol/L (ref 3.5–5.2)
Sodium: 140 mmol/L (ref 134–144)

## 2015-10-23 LAB — PSA: Prostate Specific Ag, Serum: 2.7 ng/mL (ref 0.0–4.0)

## 2016-12-14 ENCOUNTER — Ambulatory Visit (INDEPENDENT_AMBULATORY_CARE_PROVIDER_SITE_OTHER): Payer: Medicare Other

## 2016-12-14 VITALS — BP 120/60 | HR 72 | Temp 98.1°F | Resp 16 | Ht 70.0 in | Wt 258.4 lb

## 2016-12-14 DIAGNOSIS — Z1211 Encounter for screening for malignant neoplasm of colon: Secondary | ICD-10-CM

## 2016-12-14 DIAGNOSIS — Z0000 Encounter for general adult medical examination without abnormal findings: Secondary | ICD-10-CM | POA: Diagnosis not present

## 2016-12-14 DIAGNOSIS — Z1159 Encounter for screening for other viral diseases: Secondary | ICD-10-CM

## 2016-12-14 DIAGNOSIS — Z23 Encounter for immunization: Secondary | ICD-10-CM
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
## 2016-12-14 NOTE — Patient Instructions (Signed)
[name] , Thank you for taking time to come for your Medicare Wellness Visit. I appreciate your ongoing commitment to your health goals. Please review the following plan we discussed and let me know if I can assist you in the future.   Screening recommendations/referrals: Colonoscopy: Ordered today. Our office will call you with appt date/time Recommended yearly ophthalmology/optometry visit for glaucoma screening and checkup Recommended yearly dental visit for hygiene and checkup  Vaccinations: Influenza vaccine: Given today Pneumococcal vaccine: Given today Tdap vaccine: Up to date Shingles vaccine: Declined. Please call your insurance company to determine your out of pocket expense.  Advanced directives: Advance directive discussed with you today. I have provided a copy for you to complete at home and have notarized. Once this is complete please bring a copy in to our office so we can scan it into your chart.  Conditions/risks identified: Fall risk prevention discussed  Next appointment: Please schedule your annual wellness exam with your Nurse Health Advisor in one year.   Preventive Care 70 Years and Older, Male Preventive care refers to lifestyle choices and visits with your health care provider that can promote health and wellness. What does preventive care include?  A yearly physical exam. This is also called an annual well check.  Dental exams once or twice a year.  Routine eye exams. Ask your health care provider how often you should have your eyes checked.  Personal lifestyle choices, including:  Daily care of your teeth and gums.  Regular physical activity.  Eating a healthy diet.  Avoiding tobacco and drug use.  Limiting alcohol use.  Practicing safe sex.  Taking low doses of aspirin every day.  Taking vitamin and mineral supplements as recommended by your health care provider. What happens during an annual well check? The services and screenings done  by your health care provider during your annual well check will depend on your age, overall health, lifestyle risk factors, and family history of disease. Counseling  Your health care provider may ask you questions about your:  Alcohol use.  Tobacco use.  Drug use.  Emotional well-being.  Home and relationship well-being.  Sexual activity.  Eating habits.  History of falls.  Memory and ability to understand (cognition).  Work and work Statistician. Screening  You may have the following tests or measurements:  Height, weight, and BMI.  Blood pressure.  Lipid and cholesterol levels. These may be checked every 5 years, or more frequently if you are over 70 years old.  Skin check.  Lung cancer screening. You may have this screening every year starting at age 20 if you have a 30-pack-year history of smoking and currently smoke or have quit within the past 15 years.  Fecal occult blood test (FOBT) of the stool. You may have this test every year starting at age 70.  Flexible sigmoidoscopy or colonoscopy. You may have a sigmoidoscopy every 5 years or a colonoscopy every 10 years starting at age 70.  Prostate cancer screening. Recommendations will vary depending on your family history and other risks.  Hepatitis C blood test.  Hepatitis B blood test.  Sexually transmitted disease (STD) testing.  Diabetes screening. This is done by checking your blood sugar (glucose) after you have not eaten for a while (fasting). You may have this done every 1-3 years.  Abdominal aortic aneurysm (AAA) screening. You may need this if you are a current or former smoker.  Osteoporosis. You may be screened starting at age 62 if you are  at high risk. Talk with your health care provider about your test results, treatment options, and if necessary, the need for more tests. Vaccines  Your health care provider may recommend certain vaccines, such as:  Influenza vaccine. This is recommended every  year.  Tetanus, diphtheria, and acellular pertussis (Tdap, Td) vaccine. You may need a Td booster every 10 years.  Zoster vaccine. You may need this after age 70.  Pneumococcal 13-valent conjugate (PCV13) vaccine. One dose is recommended after age 70.  Pneumococcal polysaccharide (PPSV23) vaccine. One dose is recommended after age 70. Talk to your health care provider about which screenings and vaccines you need and how often you need them. This information is not intended to replace advice given to you by your health care provider. Make sure you discuss any questions you have with your health care provider. Document Released: 02/08/2015 Document Revised: 10/02/2015 Document Reviewed: 11/13/2014 Elsevier Interactive Patient Education  2017 [employer] Prevention in the Home Falls can cause injuries. They can happen to people of all ages. There are many things you can do to make your home safe and to help prevent falls. What can I do on the outside of my home?  Regularly fix the edges of walkways and driveways and fix any cracks.  Remove anything that might make you trip as you walk through a door, such as a raised step or threshold.  Trim any bushes or trees on the path to your home.  Use bright outdoor lighting.  Clear any walking paths of anything that might make someone trip, such as rocks or tools.  Regularly check to see if handrails are loose or broken. Make sure that both sides of any steps have handrails.  Any raised decks and porches should have guardrails on the edges.  Have any leaves, snow, or ice cleared regularly.  Use sand or salt on walking paths during winter.  Clean up any spills in your garage right away. This includes oil or grease spills. What can I do in the bathroom?  Use night lights.  Install grab bars by the toilet and in the tub and shower. Do not use towel bars as grab bars.  Use non-skid mats or decals in the tub or shower.  If you  need to sit down in the shower, use a plastic, non-slip stool.  Keep the floor dry. Clean up any water that spills on the floor as soon as it happens.  Remove soap buildup in the tub or shower regularly.  Attach bath mats securely with double-sided non-slip rug tape.  Do not have throw rugs and other things on the floor that can make you trip. What can I do in the bedroom?  Use night lights.  Make sure that you have a light by your bed that is easy to reach.  Do not use any sheets or blankets that are too big for your bed. They should not hang down onto the floor.  Have a firm chair that has side arms. You can use this for support while you get dressed.  Do not have throw rugs and other things on the floor that can make you trip. What can I do in the kitchen?  Clean up any spills right away.  Avoid walking on wet floors.  Keep items that you use a lot in easy-to-reach places.  If you need to reach something above you, use a strong step stool that has a grab bar.  Keep electrical cords out of  the way.  Do not use floor polish or wax that makes floors slippery. If you must use wax, use non-skid floor wax.  Do not have throw rugs and other things on the floor that can make you trip. What can I do with my stairs?  Do not leave any items on the stairs.  Make sure that there are handrails on both sides of the stairs and use them. Fix handrails that are broken or loose. Make sure that handrails are as long as the stairways.  Check any carpeting to make sure that it is firmly attached to the stairs. Fix any carpet that is loose or worn.  Avoid having throw rugs at the top or bottom of the stairs. If you do have throw rugs, attach them to the floor with carpet tape.  Make sure that you have a light switch at the top of the stairs and the bottom of the stairs. If you do not have them, ask someone to add them for you. What else can I do to help prevent falls?  Wear shoes  that:  Do not have high heels.  Have rubber bottoms.  Are comfortable and fit you well.  Are closed at the toe. Do not wear sandals.  If you use a stepladder:  Make sure that it is fully opened. Do not climb a closed stepladder.  Make sure that both sides of the stepladder are locked into place.  Ask someone to hold it for you, if possible.  Clearly mark and make sure that you can see:  Any grab bars or handrails.  First and last steps.  Where the edge of each step is.  Use tools that help you move around (mobility aids) if they are needed. These include:  Canes.  Walkers.  Scooters.  Crutches.  Turn on the lights when you go into a dark area. Replace any light bulbs as soon as they burn out.  Set up your furniture so you have a clear path. Avoid moving your furniture around.  If any of your floors are uneven, fix them.  If there are any pets around you, be aware of where they are.  Review your medicines with your doctor. Some medicines can make you feel dizzy. This can increase your chance of falling. Ask your doctor what other things that you can do to help prevent falls. This information is not intended to replace advice given to you by your health care provider. Make sure you discuss any questions you have with your health care provider. Document Released: 11/08/2008 Document Revised: 06/20/2015 Document Reviewed: 02/16/2014 Elsevier Interactive Patient Education  2017 [employer].

## 2016-12-31 ENCOUNTER — Ambulatory Visit
Admission: RE | Admit: 2016-12-31 | Discharge: 2016-12-31 | Disposition: A | Discharge status: Home / Self Care | Payer: Medicare Other | Source: Ambulatory Visit | Attending: Family Medicine | Admitting: Family Medicine

## 2016-12-31 ENCOUNTER — Ambulatory Visit (INDEPENDENT_AMBULATORY_CARE_PROVIDER_SITE_OTHER): Payer: Medicare Other | Admitting: Family Medicine

## 2016-12-31 ENCOUNTER — Encounter: Payer: Self-pay | Admitting: Family Medicine

## 2016-12-31 VITALS — BP 104/62 | HR 68 | Ht 70.0 in | Wt 264.0 lb

## 2016-12-31 DIAGNOSIS — R05 Cough: Secondary | ICD-10-CM

## 2016-12-31 DIAGNOSIS — J301 Allergic rhinitis due to pollen: Secondary | ICD-10-CM

## 2016-12-31 DIAGNOSIS — I872 Venous insufficiency (chronic) (peripheral): Secondary | ICD-10-CM

## 2016-12-31 DIAGNOSIS — F17210 Nicotine dependence, cigarettes, uncomplicated: Secondary | ICD-10-CM

## 2016-12-31 DIAGNOSIS — Z6837 Body mass index (BMI) 37.0-37.9, adult: Secondary | ICD-10-CM | POA: Diagnosis not present

## 2016-12-31 DIAGNOSIS — Z0000 Encounter for general adult medical examination without abnormal findings: Secondary | ICD-10-CM | POA: Diagnosis not present

## 2016-12-31 DIAGNOSIS — R351 Nocturia: Secondary | ICD-10-CM

## 2016-12-31 DIAGNOSIS — Z1211 Encounter for screening for malignant neoplasm of colon: Secondary | ICD-10-CM

## 2016-12-31 DIAGNOSIS — Z1159 Encounter for screening for other viral diseases: Secondary | ICD-10-CM

## 2016-12-31 DIAGNOSIS — E6609 Other obesity due to excess calories: Secondary | ICD-10-CM | POA: Diagnosis not present

## 2016-12-31 DIAGNOSIS — R053 Chronic cough: Secondary | ICD-10-CM

## 2016-12-31 LAB — HEMOCCULT GUIAC POC 1CARD (OFFICE): Fecal Occult Blood, POC: NEGATIVE

## 2016-12-31 IMAGING — CR DG CHEST 2V
4 series · 4 of 4 positions shown · non-contrast
Comparison: [DATE]

CLINICAL DATA: Cough.

EXAM:
CHEST  2 VIEW

[chest pa (1 of 2)]
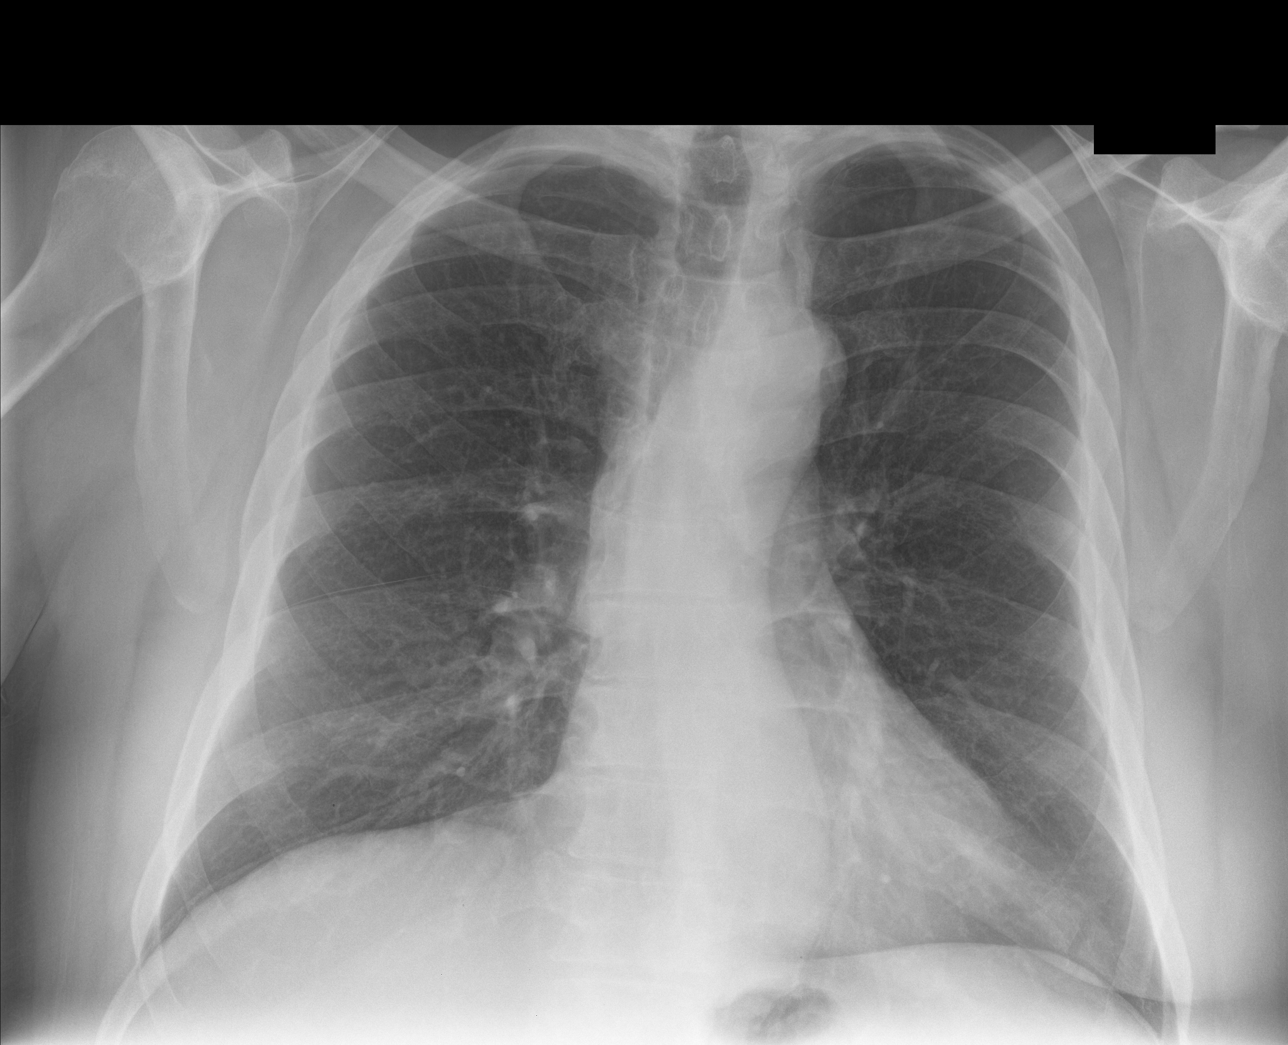

[chest lat (1 of 2)]
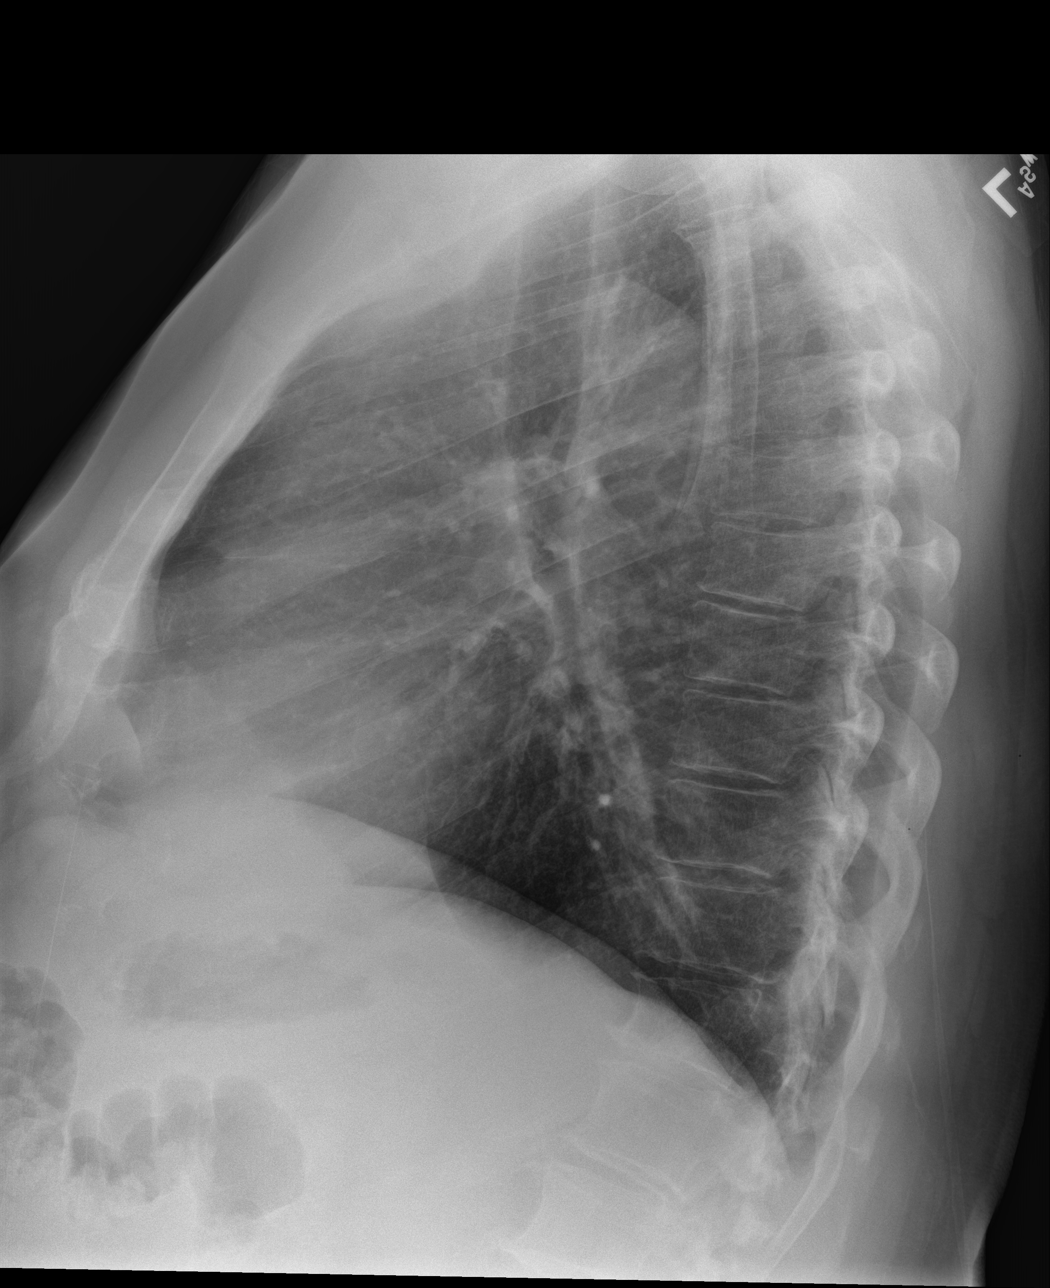

[chest pa (2 of 2)]
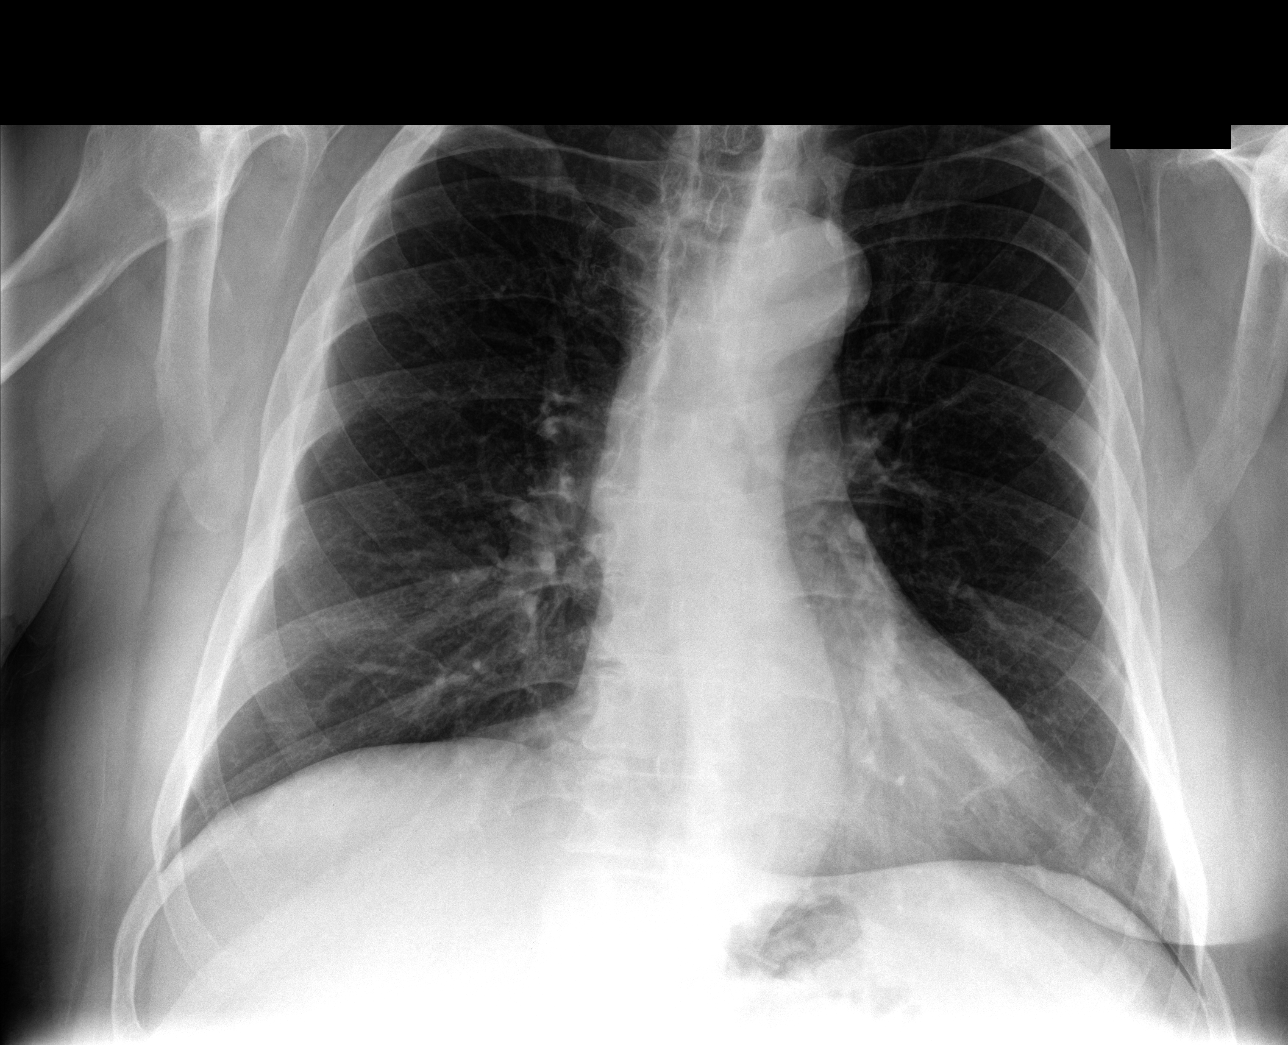

[chest lat (2 of 2)]
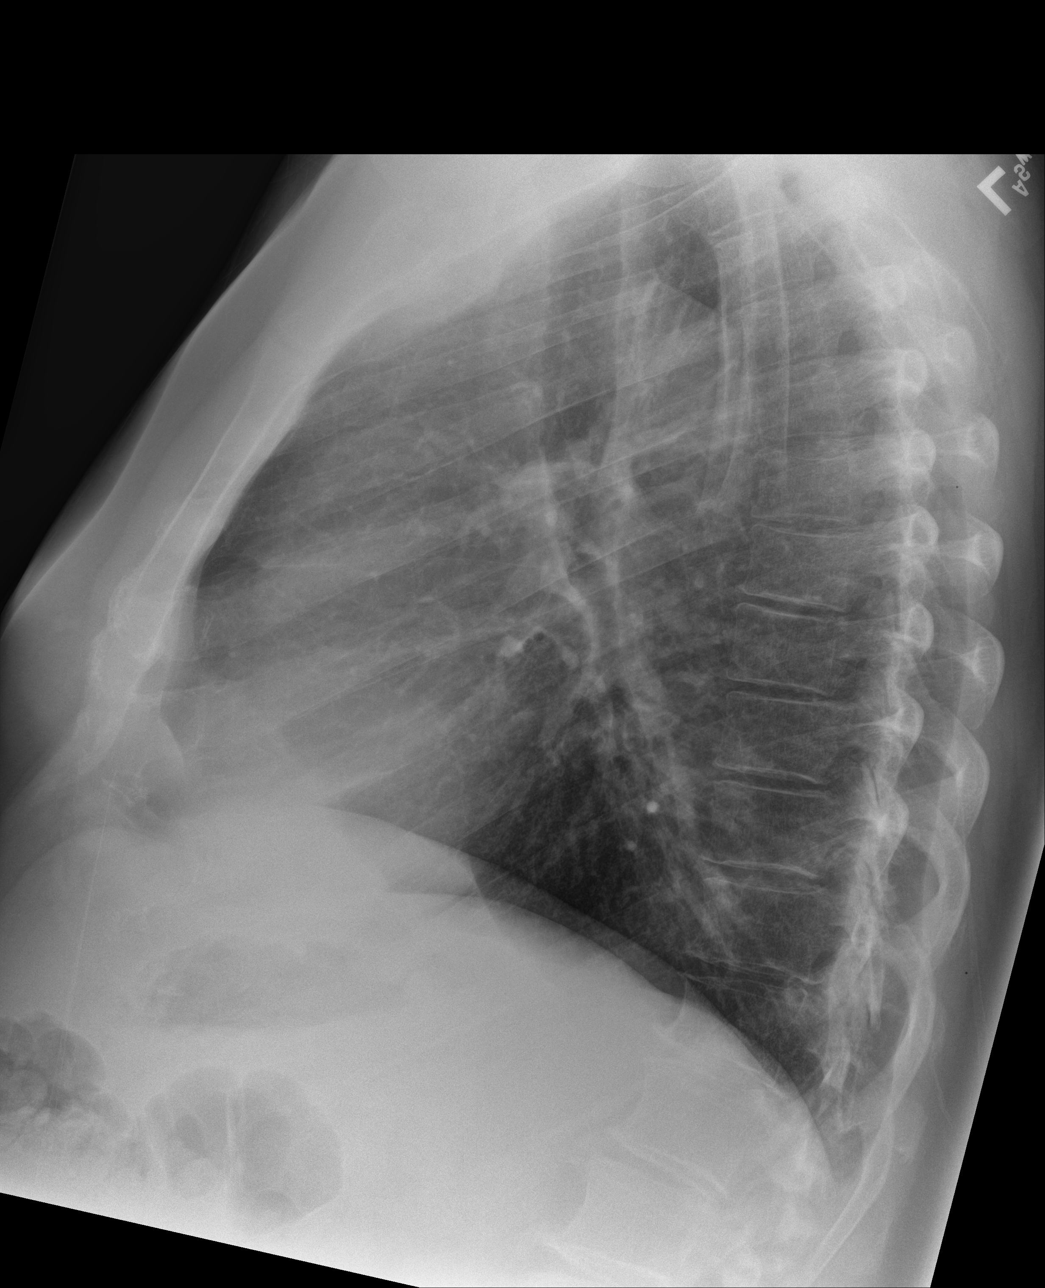

[4 of 4 positions shown; findings below may reference images not displayed]

FINDINGS: Mediastinum and hilar structures normal. Lungs are clear. Heart size
normal. No pleural effusion or pneumothorax. No acute bony
abnormality . Thoracic spine scoliosis.
IMPRESSION: No acute cardiopulmonary disease .

## 2016-12-31 NOTE — Progress Notes (Signed)
Name: Brett Boone   MRN: [id number]    DOB: [birth year]   Date:12/31/2016       Progress Note  Subjective  Chief Complaint  Chief Complaint  Patient presents with  . Annual Exam    discoloration is getting worse on lower extremities-/needs baseline chest film    Patient presents for annual physical exam.     Cough  This is a new problem. The current episode started more than 1 month ago (3 month). The problem has been waxing and waning. The cough is productive of sputum. Pertinent negatives include no chest pain, chills, ear pain, fever, headaches, heartburn, hemoptysis, myalgias, nasal congestion, postnasal drip, rash, rhinorrhea, sore throat, shortness of breath, sweats, weight loss or wheezing. Risk factors for lung disease include smoking/tobacco exposure. He has tried nothing for the symptoms. The treatment provided moderate relief. There is no history of environmental allergies.    No problem-specific Assessment & Plan notes found for this encounter.   Past Medical History:  Diagnosis Date  . Back injury     Past Surgical History:  Procedure Laterality Date  . COLONOSCOPY  2013   normal- cleared for 5 yrs- Dr Beckey Downing  . EPIGASTRIC HERNIA REPAIR N/A 07/06/2014   Procedure: HERNIA REPAIR EPIGASTRIC ADULT;  Surgeon: Molly Maduro, MD;  Location: [facility];  Service: General;  Laterality: N/A;  . HERNIA REPAIR Bilateral   . INSERTION OF MESH N/A 07/06/2014   Procedure: INSERTION OF MESH;  Surgeon: Molly Maduro, MD;  Location: [facility] OR[facility];  Service: General;  Laterality: N/A;  . SHOULDER ARTHROSCOPY Left 2008  . SINUS EXPLORATION      Family History  Problem Relation Age of Onset  . Diabetes Father   . Heart disease Father 51  . Stroke Mother     Social History   Socioeconomic History  . Marital status: Married    Spouse name: Not on file  . Number of children: 3  . Years of education: Not on file  . Highest education level: Not on file  Social Needs   . Financial resource strain: Not hard at all  . Food insecurity - worry: Not on file  . Food insecurity - inability: Not on file  . Transportation needs - medical: No  . Transportation needs - non-medical: No  Occupational History  . Occupation: Retired  Tobacco Use  . Smoking status: Current Some Day Smoker    Packs/day: 0.25    Years: 30.00    Pack years: 7.50    Types: Cigarettes  . Smokeless tobacco: Former Network engineer and Sexual Activity  . Alcohol use: Yes    Comment: Occasional  . Drug use: No  . Sexual activity: Yes  Other Topics Concern  . Not on file  Social History Narrative  . Not on file    Allergies  Allergen Reactions  . Augmentin [Amoxicillin-Pot Clavulanate] Hives and Nausea And Vomiting    Outpatient Medications Prior to Visit  Medication Sig Dispense Refill  . doxycycline (VIBRA-TABS) 100 MG tablet Take 1 tablet (100 mg total) by mouth 2 (two) times daily. (Patient not taking: Reported on 10/22/2015) 20 tablet 0  . guaiFENesin-codeine 100-10 MG/5ML syrup Take 5 mLs by mouth 3 (three) times daily as needed. (Patient not taking: Reported on 10/22/2015) 120 mL 0  . levofloxacin (LEVAQUIN) 500 MG tablet Take 1 tablet (500 mg total) by mouth daily. (Patient not taking: Reported on 10/22/2015) 7 tablet 0   No facility-administered medications prior to  visit.     Review of Systems  Constitutional: Negative for chills, fever, malaise/fatigue and weight loss.  HENT: Negative for ear discharge, ear pain, postnasal drip, rhinorrhea and sore throat.   Eyes: Negative for blurred vision.  Respiratory: Negative for cough, hemoptysis, sputum production, shortness of breath and wheezing.   Cardiovascular: Negative for chest pain, palpitations and leg swelling.  Gastrointestinal: Negative for abdominal pain, blood in stool, constipation, diarrhea, heartburn, melena and nausea.  Genitourinary: Negative for dysuria, frequency, hematuria and urgency.  Musculoskeletal:  Negative for back pain, joint pain, myalgias and neck pain.  Skin: Negative for rash.       Discoloration legs  Neurological: Negative for dizziness, tingling, sensory change, focal weakness and headaches.  Endo/Heme/Allergies: Negative for environmental allergies and polydipsia. Does not bruise/bleed easily.  Psychiatric/Behavioral: Negative for depression and suicidal ideas. The patient is not nervous/anxious and does not have insomnia.      Objective  Vitals:   12/31/16 0834  BP: 104/62  Pulse: 68  Weight: 264 lb (119.7 kg)  Height: 5\' 10"  (1.778 m)    Physical Exam  Constitutional: He is oriented to person, place, and time and well-developed, well-nourished, and in no distress.  HENT:  Head: Normocephalic.  Right Ear: Tympanic membrane, external ear and ear canal normal.  Left Ear: Tympanic membrane, external ear and ear canal normal.  Nose: Nose normal.  Mouth/Throat: Uvula is midline and oropharynx is clear and moist. No oropharyngeal exudate, posterior oropharyngeal edema or posterior oropharyngeal erythema.  Eyes: Conjunctivae, EOM and lids are normal. Pupils are equal, round, and reactive to light. Right eye exhibits no discharge. Left eye exhibits no discharge. No scleral icterus.  Fundoscopic exam:      The right eye shows no arteriolar narrowing.       The left eye shows no arteriolar narrowing.  Neck: Normal range of motion. Neck supple. Normal carotid pulses, no hepatojugular reflux and no JVD present. Carotid bruit is not present. No tracheal deviation present. No thyromegaly present.  Cardiovascular: Normal rate, regular rhythm, S1 normal, S2 normal, normal heart sounds, intact distal pulses and normal pulses. PMI is not displaced. Exam reveals no gallop, no S3, no S4 and no friction rub.  No murmur heard. Pulmonary/Chest: Breath sounds normal. No respiratory distress. He has no wheezes. He has no rales. Right breast exhibits no mass. Left breast exhibits no mass.   Abdominal: Soft. Bowel sounds are normal. He exhibits no mass. There is no hepatosplenomegaly. There is no tenderness. There is no rebound, no guarding and no CVA tenderness.  Genitourinary: Rectum normal and prostate normal. He exhibits abnormal scrotal mass. He exhibits no abnormal testicular mass, no testicular tenderness and no scrotal tenderness.  Genitourinary Comments: hydrocoel  Musculoskeletal: Normal range of motion. He exhibits no edema or tenderness.  Lymphadenopathy:       Head (right side): No submandibular adenopathy present.       Head (left side): No submandibular adenopathy present.    He has no cervical adenopathy.  Neurological: He is alert and oriented to person, place, and time. He has normal motor skills, normal sensation, normal strength, normal reflexes and intact cranial nerves. No cranial nerve deficit.  Skin: Skin is warm. No rash noted.  Psychiatric: Mood and affect normal.  Nursing note and vitals reviewed.     Assessment & Plan  Problem List Items Addressed This Visit      Other   Annual physical exam - Primary   Relevant Orders  Renal function panel   POCT occult blood stool (Completed)   Cigarette nicotine dependence without complication    Other Visit Diagnoses    Chronic cough       Relevant Orders   DG Chest 2 View (Completed)   Venous stasis dermatitis of both lower extremities       Allergic rhinitis due to pollen, unspecified seasonality       Nocturia       Relevant Orders   PSA   Class 2 obesity due to excess calories without serious comorbidity with body mass index (BMI) of 37.0 to 37.9 in adult       Relevant Orders   Lipid panel   Need for hepatitis C screening test       Relevant Orders   Hepatitis C antibody   Colon cancer screening       Relevant Orders   POCT occult blood stool (Completed)      No orders of the defined types were placed in this encounter.     Dr. [facility]  12/31/16

## 2016-12-31 NOTE — Patient Instructions (Signed)
Stasis Dermatitis Stasis dermatitis is a long-term (chronic) skin condition that happens when veins can no longer pump blood back to the heart (poor circulation). This condition causes a red or brown scaly rash or sores (ulcers) from the pooling of blood (stasis). This condition usually affects the lower legs. It may affect one leg or both legs. Without treatment, severe stasis dermatitis can lead to other skin conditions and infections. What are the causes? This condition is caused by poor circulation. What increases the risk? This condition is more likely to develop in people who:  Are not very active.  Stand for long periods of time.  Have veins that have become enlarged and twisted (varicose veins).  Have leg veins that are not strong enough to send blood back to the heart (venous insufficiency).  Have had a blood clot.  Have been pregnant many times.  Have had vein surgery.  Are obese.  Have heart or kidney failure.  Are 50 years of age or older.  What are the signs or symptoms? Common early symptoms of this condition include:  Swelling in your ankle or leg. This might get better overnight but be worse again in the day.  Skin that looks thin on your ankle and leg.  Brown marks that develop slowly.  Skin that is easily irritated or cracked.  Red, swollen skin.  An achy or heavy feeling after you walk or stand for long periods of time.  Pain.  Later and more severe symptoms of this condition include:  Skin that looks shiny.  Small, open sores (ulcers). These are often red or purple.  Dry, cracking skin.  Skin that feels hard.  Severe itching.  A change in the shape or color of your lower legs.  Severe pain.  Difficulty walking.  How is this diagnosed? Your health care provider may suspect this condition from your symptoms and medical history. Your health care provider will also do a physical exam. You may need to see a health care provider who  specializes in skin diseases (dermatologist). You may also have tests to confirm the diagnosis, including:  Blood tests.  Imaging studies to check blood flow (Doppler ultrasound).  Allergy tests.  How is this treated? Treatment for this condition may include medicine, such as:  Corticosteroid creams and ointments.  Non-corticosteroid medicines applied to the skin (topical).  Medicine to reduce swelling in the legs (diuretics).  Antibiotics.  Medicine to relieve itching (antihistamines).  You may also have to wear:  Compression stockings or an elastic wrap to improve circulation.  A bandage (dressing).  A wrap that contains zinc and gelatin (Unna boot).  Follow these instructions at home: Skin Care  Moisturize your skin as told by your health care provider. Do not use moisturizers with fragrance. This can irritate your skin.  Apply cool compresses to the affected areas.  Do not scratch your skin.  Do not rub your skin dry after a bath or shower. Gently pat your skin dry.  Do not use scented soaps, detergents, or perfumes. Medicines  Take or use over-the-counter and prescription medicines only as told by your health care provider.  If you were prescribed an antibiotic medicine, take or use it as told by your health care provider. Do not stop taking or using the antibiotic even if your condition starts to improve. Lifestyle  Do not stand or sit in one position for long periods of time.  Do not cross your legs when you sit.  Raise (elevate) your   legs above the level of your heart when you are sitting or lying down.  Walk as told by your health care provider. Walking increases blood flow.  Wear comfortable, loose-fitting clothing. Circulation in your legs will be worse if you wear tight pants, belts, and waistbands. General instructions  Change and remove any dressing as told by your health care provider, if this applies.  Wear compression stockings as told by  your health care provider, if this applies. These stockings help to prevent blood clots and reduce swelling in your legs.  Wear the Unna boot as told by your health care provider, if this applies.  Keep all follow-up visits as told by your health care provider. This is important. Contact a health care provider if:  Your condition does not improve with treatment.  Your condition gets worse.  You have signs of infection in the affected area. Watch for: ? Swelling. ? Tenderness. ? Redness. ? Soreness. ? Warmth.  You have a fever. Get help right away if:  You notice red streaks coming from the affected area.  Your bone or joint underneath the affected area becomes painful after the skin has healed.  The affected area turns darker.  You feel a deep pain in your leg or groin.  You are short of breath. This information is not intended to replace advice given to you by your health care provider. Make sure you discuss any questions you have with your health care provider. Document Released: 04/23/2005 Document Revised: 09/10/2015 Document Reviewed: 05/30/2014 Elsevier Interactive Patient Education  2018 [employer]  

## 2017-01-01 LAB — RENAL FUNCTION PANEL
Albumin: 4.4 g/dL (ref 3.5–4.8)
BUN/Creatinine Ratio: 14 (ref 10–24)
BUN: 15 mg/dL (ref 8–27)
CO2: 24 mmol/L (ref 20–29)
Calcium: 9.2 mg/dL (ref 8.6–10.2)
Chloride: 104 mmol/L (ref 96–106)
Creatinine, Ser: 1.1 mg/dL (ref 0.76–1.27)
GFR calc Af Amer: 78 mL/min/{1.73_m2} (ref >59)
GFR calc non Af Amer: 68 mL/min/{1.73_m2} (ref >59)
Glucose: 107 mg/dL — ABNORMAL HIGH (ref 65–99)
Phosphorus: 3.1 mg/dL (ref 2.5–4.5)
Potassium: 5.1 mmol/L (ref 3.5–5.2)
Sodium: 143 mmol/L (ref 134–144)

## 2017-01-01 LAB — PSA: Prostate Specific Ag, Serum: 3.3 ng/mL (ref 0.0–4.0)

## 2017-01-01 LAB — LIPID PANEL
Chol/HDL Ratio: 1.7 ratio (ref 0.0–5.0)
Cholesterol, Total: 74 mg/dL — ABNORMAL LOW (ref 100–199)
HDL: 43 mg/dL (ref >39)
LDL Calculated: 23 mg/dL (ref 0–99)
Triglycerides: 42 mg/dL (ref 0–149)
VLDL Cholesterol Cal: 8 mg/dL (ref 5–40)

## 2017-01-01 LAB — HEPATITIS C ANTIBODY: Hep C Virus Ab: <0.1 s/co ratio (ref 0.0–0.9)

## 2017-01-05 ENCOUNTER — Other Ambulatory Visit: Payer: Self-pay

## 2017-01-05 MED ORDER — AZITHROMYCIN 250 MG PO TABS: ORAL_TABLET | ORAL | 0 refills | Status: DC

## 2017-01-14 DIAGNOSIS — H2513 Age-related nuclear cataract, bilateral: Secondary | ICD-10-CM | POA: Diagnosis not present

## 2017-01-29 ENCOUNTER — Other Ambulatory Visit: Payer: Self-pay

## 2017-01-29 DIAGNOSIS — Z1211 Encounter for screening for malignant neoplasm of colon: Secondary | ICD-10-CM

## 2017-01-29 DIAGNOSIS — Z1212 Encounter for screening for malignant neoplasm of rectum: Principal | ICD-10-CM

## 2017-01-29 MED ORDER — NA SULFATE-K SULFATE-MG SULF 17.5-3.13-1.6 GM/177ML PO SOLN: 1.0000 | Freq: Once | ORAL | 0 refills | Status: AC

## 2017-01-29 NOTE — Progress Notes (Signed)
Gastroenterology Pre-Procedure Review  Request Date: 1/23 Requesting Physician: Dr. Marius Ditch  PATIENT REVIEW QUESTIONS: The patient responded to the following health history questions as indicated:    1. Are you having any GI issues? no 2. Do you have a personal history of Polyps? yes (2013) 3. Do you have a family history of Colon Cancer or Polyps? no 4. Diabetes Mellitus? no 5. Joint replacements in the past 12 months?no 6. Major health problems in the past 3 months?no 7. Any artificial heart valves, MVP, or defibrillator?no    MEDICATIONS & ALLERGIES:    Patient reports the following regarding taking any anticoagulation/antiplatelet therapy:   Plavix, Coumadin, Eliquis, Xarelto, Lovenox, Pradaxa, Brilinta, or Effient? no Aspirin? no  Patient confirms/reports the following medications:  Current Outpatient Medications  Medication Sig Dispense Refill  . sildenafil (REVATIO) 20 MG tablet 2-5 tablets as needed 1 hour prior to intercourse    . azithromycin (ZITHROMAX) 250 MG tablet Use as directed 6 tablet 0   No current facility-administered medications for this visit.     Patient confirms/reports the following allergies:  Allergies  Allergen Reactions  . Augmentin [Amoxicillin-Pot Clavulanate] Hives and Nausea And Vomiting    No orders of the defined types were placed in this encounter.   AUTHORIZATION INFORMATION Primary Insurance: 1D#: Group #:  Secondary Insurance: 1D#: Group #:  SCHEDULE INFORMATION: Date: 02/17/17 Time: Location: [place]

## 2017-01-29 NOTE — Addendum Note (Signed)
Addended by: Peggye Ley on: 01/29/2017 03:17 PM   Modules accepted: Orders

## 2017-02-01 ENCOUNTER — Telehealth: Payer: Self-pay | Admitting: Gastroenterology

## 2017-02-01 NOTE — Telephone Encounter (Signed)
Gastroenterology Pre-Procedure Review  Request Date Requesting Physician: Dr.    PATIENT REVIEW QUESTIONS: The patient responded to the following health history questions as indicated:    1. Are you having any GI issues? no 2. Do you have a personal history of Polyps? yes (5 years ago) 3. Do you have a family history of Colon Cancer or Polyps? no 4. Diabetes Mellitus? no 5. Joint replacements in the past 12 months?no 6. Major health problems in the past 3 months?no  7. Any artificial heart valves, MVP, or defibrillator?no    MEDICATIONS & ALLERGIES:    Patient reports the following regarding taking any anticoagulation/antiplatelet therapy:   Plavix, Coumadin, Eliquis, Xarelto, Lovenox, Pradaxa, Brilinta, or Effient? no Aspirin? no  Patient confirms/reports the following medications:  Current Outpatient Medications  Medication Sig Dispense Refill   azithromycin (ZITHROMAX) 250 MG tablet Use as directed 6 tablet 0   sildenafil (REVATIO) 20 MG tablet 2-5 tablets as needed 1 hour prior to intercourse     No current facility-administered medications for this visit.     Patient confirms/reports the following allergies:  Allergies  Allergen Reactions   Augmentin [Amoxicillin-Pot Clavulanate] Hives and Nausea And Vomiting    No orders of the defined types were placed in this encounter.   AUTHORIZATION INFORMATION Primary Insurance: 1D#: Group #:  Secondary Insurance: 1D#: Group #:  SCHEDULE INFORMATION: Date:  Time: Location:

## 2017-02-10 ENCOUNTER — Encounter: Payer: Self-pay | Admitting: *Deleted

## 2017-02-10 ENCOUNTER — Other Ambulatory Visit: Payer: Self-pay

## 2017-02-16 NOTE — Discharge Instructions (Signed)
General Anesthesia, Adult, Care After °These instructions provide you with information about caring for yourself after your procedure. Your health care provider may also give you more specific instructions. Your treatment has been planned according to current medical practices, but problems sometimes occur. Call your health care provider if you have any problems or questions after your procedure. °What can I expect after the procedure? °After the procedure, it is common to have: °· Vomiting. °· A sore throat. °· Mental slowness. ° °It is common to feel: °· Nauseous. °· Cold or shivery. °· Sleepy. °· Tired. °· Sore or achy, even in parts of your body where you did not have surgery. ° °Follow these instructions at home: °For at least 24 hours after the procedure: °· Do not: °? Participate in activities where you could fall or become injured. °? Drive. °? Use heavy machinery. °? Drink alcohol. °? Take sleeping pills or medicines that cause drowsiness. °? Make important decisions or sign legal documents. °? Take care of children on your own. °· Rest. °Eating and drinking °· If you vomit, drink water, juice, or soup when you can drink without vomiting. °· Drink enough fluid to keep your urine clear or pale yellow. °· Make sure you have little or no nausea before eating solid foods. °· Follow the diet recommended by your health care provider. °General instructions °· Have a responsible adult stay with you until you are awake and alert. °· Return to your normal activities as told by your health care provider. Ask your health care provider what activities are safe for you. °· Take over-the-counter and prescription medicines only as told by your health care provider. °· If you smoke, do not smoke without supervision. °· Keep all follow-up visits as told by your health care provider. This is important. °Contact a health care provider if: °· You continue to have nausea or vomiting at home, and medicines are not helpful. °· You  cannot drink fluids or start eating again. °· You cannot urinate after 8-12 hours. °· You develop a skin rash. °· You have fever. °· You have increasing redness at the site of your procedure. °Get help right away if: °· You have difficulty breathing. °· You have chest pain. °· You have unexpected bleeding. °· You feel that you are having a life-threatening or urgent problem. °This information is not intended to replace advice given to you by your health care provider. Make sure you discuss any questions you have with your health care provider. °Document Released: 04/20/2000 Document Revised: 06/17/2015 Document Reviewed: 12/27/2014 °Elsevier Interactive Patient Education © 2018 [employer] ° °

## 2017-02-17 ENCOUNTER — Ambulatory Visit
Admission: RE | Admit: 2017-02-17 | Discharge: 2017-02-17 | Disposition: A | Discharge status: Home / Self Care | Payer: Medicare Other | Source: Ambulatory Visit | Attending: Gastroenterology | Admitting: Gastroenterology

## 2017-02-17 ENCOUNTER — Encounter
Admission: RE | Disposition: A | Discharge status: Home / Self Care | Payer: Self-pay | Source: Ambulatory Visit | Attending: Gastroenterology

## 2017-02-17 ENCOUNTER — Ambulatory Visit: Payer: Medicare Other | Admitting: Anesthesiology

## 2017-02-17 DIAGNOSIS — K635 Polyp of colon: Secondary | ICD-10-CM | POA: Diagnosis not present

## 2017-02-17 DIAGNOSIS — Z823 Family history of stroke: Secondary | ICD-10-CM | POA: Diagnosis not present

## 2017-02-17 DIAGNOSIS — Z833 Family history of diabetes mellitus: Secondary | ICD-10-CM | POA: Diagnosis not present

## 2017-02-17 DIAGNOSIS — Z1211 Encounter for screening for malignant neoplasm of colon: Secondary | ICD-10-CM | POA: Diagnosis not present

## 2017-02-17 DIAGNOSIS — D120 Benign neoplasm of cecum: Secondary | ICD-10-CM | POA: Insufficient documentation

## 2017-02-17 DIAGNOSIS — Z86010 Personal history of colonic polyps: Secondary | ICD-10-CM | POA: Insufficient documentation

## 2017-02-17 DIAGNOSIS — K621 Rectal polyp: Secondary | ICD-10-CM | POA: Diagnosis not present

## 2017-02-17 DIAGNOSIS — D124 Benign neoplasm of descending colon: Secondary | ICD-10-CM | POA: Diagnosis not present

## 2017-02-17 DIAGNOSIS — Z881 Allergy status to other antibiotic agents status: Secondary | ICD-10-CM | POA: Insufficient documentation

## 2017-02-17 DIAGNOSIS — D123 Benign neoplasm of transverse colon: Secondary | ICD-10-CM | POA: Diagnosis not present

## 2017-02-17 DIAGNOSIS — F17210 Nicotine dependence, cigarettes, uncomplicated: Secondary | ICD-10-CM | POA: Diagnosis not present

## 2017-02-17 DIAGNOSIS — Z860101 Personal history of adenomatous and serrated colon polyps: Secondary | ICD-10-CM

## 2017-02-17 HISTORY — PX: POLYPECTOMY: SHX5525

## 2017-02-17 HISTORY — PX: COLONOSCOPY WITH PROPOFOL: SHX5780

## 2017-02-17 HISTORY — DX: Cardiac murmur, unspecified: R01.1

## 2017-02-17 LAB — HM COLONOSCOPY

## 2017-02-17 SURGERY — COLONOSCOPY WITH PROPOFOL
Anesthesia: General | Site: Rectum | Wound class: Contaminated

## 2017-02-17 MED ORDER — STERILE WATER FOR IRRIGATION IR SOLN
Status: DC | PRN
  Administered 2017-02-17: 08:00:00

## 2017-02-17 MED ORDER — PROPOFOL 10 MG/ML IV BOLUS
INTRAVENOUS | Status: DC | PRN
  Administered 2017-02-17: 20 mg via INTRAVENOUS
  Administered 2017-02-17: 100 mg via INTRAVENOUS
  Administered 2017-02-17 (×3): 30 mg via INTRAVENOUS
  Administered 2017-02-17: 50 mg via INTRAVENOUS
  Administered 2017-02-17 (×2): 30 mg via INTRAVENOUS
  Administered 2017-02-17: 40 mg via INTRAVENOUS
  Administered 2017-02-17: 20 mg via INTRAVENOUS
  Administered 2017-02-17 (×2): 50 mg via INTRAVENOUS
  Administered 2017-02-17: 20 mg via INTRAVENOUS

## 2017-02-17 MED ORDER — LACTATED RINGERS IV SOLN
10.0000 mL/h | INTRAVENOUS | Status: DC
  Administered 2017-02-17: 08:00:00 via INTRAVENOUS

## 2017-02-17 MED ORDER — LIDOCAINE HCL (CARDIAC) 20 MG/ML IV SOLN
INTRAVENOUS | Status: DC | PRN
  Administered 2017-02-17: 50 mg via INTRAVENOUS

## 2017-02-17 SURGICAL SUPPLY — 24 items
CANISTER SUCT 1200ML W/VALVE (MISCELLANEOUS) ×3 IMPLANT
CLIP HMST 235XBRD CATH ROT (MISCELLANEOUS) IMPLANT
CLIP RESOLUTION 360 11X235 (MISCELLANEOUS)
ELECT REM PT RETURN 9FT ADLT (ELECTROSURGICAL) ×3
ELECTRODE REM PT RTRN 9FT ADLT (ELECTROSURGICAL) ×2 IMPLANT
FCP ESCP3.2XJMB 240X2.8X (MISCELLANEOUS)
FORCEPS BIOP RAD 4 LRG CAP 4 (CUTTING FORCEPS) ×3 IMPLANT
FORCEPS BIOP RJ4 240 W/NDL (MISCELLANEOUS)
FORCEPS ESCP3.2XJMB 240X2.8X (MISCELLANEOUS) IMPLANT
GOWN CVR UNV OPN BCK APRN NK (MISCELLANEOUS) ×4 IMPLANT
GOWN ISOL THUMB LOOP REG UNIV (MISCELLANEOUS) ×2
INJECTOR VARIJECT VIN23 (MISCELLANEOUS) IMPLANT
KIT DEFENDO VALVE AND CONN (KITS) IMPLANT
KIT ENDO PROCEDURE OLY (KITS) ×3 IMPLANT
MARKER SPOT ENDO TATTOO 5ML (MISCELLANEOUS) IMPLANT
PROBE APC STR FIRE (PROBE) IMPLANT
RETRIEVER NET ROTH 2.5X230 LF (MISCELLANEOUS) IMPLANT
SNARE SHORT THROW 13M SML OVAL (MISCELLANEOUS) ×3 IMPLANT
SNARE SHORT THROW 30M LRG OVAL (MISCELLANEOUS) IMPLANT
SNARE SNG USE RND 15MM (INSTRUMENTS) IMPLANT
SPOT EX ENDOSCOPIC TATTOO (MISCELLANEOUS)
TRAP ETRAP POLY (MISCELLANEOUS) ×3 IMPLANT
VARIJECT INJECTOR VIN23 (MISCELLANEOUS)
WATER STERILE IRR 250ML POUR (IV SOLUTION) ×3 IMPLANT

## 2017-02-17 NOTE — Op Note (Signed)
[facility] Gastroenterology Patient Name: Brett Boone Procedure Date: 02/17/2017 7:32 AM MRN: [id number] Account #: [id number] Date of Birth: 19-Apr-1945 Admit Type: Outpatient Age: 72 Room: [facility] OR ROOM 01 Gender: Male Note Status: Finalized Procedure:            Colonoscopy Indications:          Surveillance: Personal history of colonic polyps                        (unknown histology) on last colonoscopy more than 5                        years ago, Surveillance: Personal history of                        adenomatous polyps on last colonoscopy > 5 years ago Providers:            Rohini Raeanne Gathers MD, MD Referring MD:         [name], MD (Referring MD) Medicines:            Monitored Anesthesia Care Complications:        No immediate complications. Estimated blood loss: None. Procedure:            Pre-Anesthesia Assessment:                       - Prior to the procedure, a History and Physical was                        performed, and patient medications and allergies were                        reviewed. The patient is competent. The risks and                        benefits of the procedure and the sedation options and                        risks were discussed with the patient. All questions                        were answered and informed consent was obtained.                        Patient identification and proposed procedure were                        verified by the physician, the nurse, the                        anesthesiologist, the anesthetist and the technician in                        the pre-procedure area in the procedure room. Mental                        Status Examination: alert and oriented. Airway                        Examination: normal oropharyngeal airway and neck  mobility. Respiratory Examination: clear to                        auscultation. CV Examination: normal. Prophylactic         Antibiotics: The patient does not require prophylactic                        antibiotics. Prior Anticoagulants: The patient has                        taken no previous anticoagulant or antiplatelet agents.                        ASA Grade Assessment: II - A patient with mild systemic                        disease. After reviewing the risks and benefits, the                        patient was deemed in satisfactory condition to undergo                        the procedure. The anesthesia plan was to use monitored                        anesthesia care (MAC). Immediately prior to                        administration of medications, the patient was                        re-assessed for adequacy to receive sedatives. The                        heart rate, respiratory rate, oxygen saturations, blood                        pressure, adequacy of pulmonary ventilation, and                        response to care were monitored throughout the                        procedure. The physical status of the patient was                        re-assessed after the procedure.                       After obtaining informed consent, the colonoscope was                        passed under direct vision. Throughout the procedure,                        the patient's blood pressure, pulse, and oxygen                        saturations were monitored continuously. The Olympus  CF-HQ190L Colonoscope (S#. [id number]) was introduced                        through the anus and advanced to the the cecum,                        identified by appendiceal orifice and ileocecal valve.                        The colonoscopy was performed without difficulty. The                        patient tolerated the procedure well. The quality of                        the bowel preparation was evaluated using the BBPS                        [facility] Bowel Preparation Scale) with scores of: Right                         Colon = 2 (minor amount of residual staining, small                        fragments of stool and/or opaque liquid, but mucosa                        seen well), Transverse Colon = 2 (minor amount of                        residual staining, small fragments of stool and/or                        opaque liquid, but mucosa seen well) and Left Colon = 2                        (minor amount of residual staining, small fragments of                        stool and/or opaque liquid, but mucosa seen well). The                        total BBPS score equals 6. The quality of the bowel                        preparation was fair. Findings:      The perianal and digital rectal examinations were normal. Pertinent       negatives include normal sphincter tone and no palpable rectal lesions.      A 4 mm polyp was found in the cecum. The polyp was sessile. The polyp       was removed with a cold biopsy forceps. Resection and retrieval were       complete.      A 12 mm polyp was found in the cecum. The polyp was semi-pedunculated.       The polyp was removed with a hot snare. Resection and retrieval were       complete.      A 5 mm polyp  was found in the cecum. The polyp was sessile. The polyp       was removed with a cold snare. Resection and retrieval were complete.      Two sessile polyps were found in the transverse colon. The polyps were 8       to 10 mm in size. These polyps were removed with a hot snare. Resection       and retrieval were complete.      A 5 mm polyp was found in the descending colon. The polyp was sessile.       The polyp was removed with a cold snare. Resection and retrieval were       complete.      A 8 mm polyp was found in the rectum. The polyp was semi-pedunculated.       The polyp was removed with a hot snare. Resection and retrieval were       complete.      The retroflexed view of the distal rectum and anal verge was normal and       showed no anal or  rectal abnormalities.      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 Impression:           - Preparation of the colon was fair.                       - One 4 mm polyp in the cecum, removed with a cold                        biopsy forceps. Resected and retrieved.                       - One 12 mm polyp in the cecum, removed with a hot                        snare. Resected and retrieved.                       - One 5 mm polyp in the cecum, removed with a cold                        snare. Resected and retrieved.                       - Two 8 to 10 mm polyps in the transverse colon,                        removed with a hot snare. Resected and retrieved.                       - One 5 mm polyp in the descending colon, removed with                        a cold snare. Resected and retrieved.                       - One 8 mm polyp in the rectum, removed with a hot  snare. Resected and retrieved.                       - The distal rectum and anal verge are normal on                        retroflexion view. Recommendation:       - Discharge patient to home.                       - Resume previous diet today.                       - Continue present medications.                       - Await pathology results.                       - Repeat colonoscopy in 1 year for surveillance. Procedure Code(s):    --- Professional ---                       [phone], Colonoscopy, flexible; with removal of tumor(s),                        polyp(s), or other lesion(s) by snare technique                       45380, 84, Colonoscopy, flexible; with biopsy, single                        or multiple Diagnosis Code(s):    --- Professional ---                       D12.0, Benign neoplasm of cecum                       D12.4, Benign neoplasm of descending colon                       K62.1, Rectal polyp                       D12.3, Benign neoplasm of  transverse colon (hepatic                        flexure or splenic flexure)                       Z86.010, Personal history of colonic polyps CPT copyright 2016 American Medical Association. All rights reserved. The codes documented in this report are preliminary and upon coder review may  be revised to meet current compliance requirements. Dr. Ulyess Mort Lin Landsman MD, MD 02/17/2017 8:59:14 AM This report has been signed electronically. Number of Addenda: 0 Note Initiated On: 02/17/2017 7:32 AM Scope Withdrawal Time: 0 hours 31 minutes 33 seconds  Total Procedure Duration: 0 hours 36 minutes 44 seconds       [facility]

## 2017-02-17 NOTE — H&P (Signed)
  Brett Darby, MD [address]  [address]  [place], [place] [address]  Main: [phone]  Fax: [phone] Pager: [phone]  Primary Care Physician:  [name], MD Primary Gastroenterologist:  Dr. Cephas Boone  Pre-Procedure History & Physical: HPI:  Brett Boone is a 72 y.o. male is here for an colonoscopy.   Past Medical History:  Diagnosis Date  . Back injury   . Heart murmur     Past Surgical History:  Procedure Laterality Date  . COLONOSCOPY  2013   normal- cleared for 5 yrs- Dr Beckey Downing  . EPIGASTRIC HERNIA REPAIR N/A 07/06/2014   Procedure: HERNIA REPAIR EPIGASTRIC ADULT;  Surgeon: Molly Maduro, MD;  Location: [facility] ORS;  Service: General;  Laterality: N/A;  . HERNIA REPAIR Bilateral   . INSERTION OF MESH N/A 07/06/2014   Procedure: INSERTION OF MESH;  Surgeon: Molly Maduro, MD;  Location: [facility] ORS;  Service: General;  Laterality: N/A;  . SHOULDER ARTHROSCOPY Left 2008  . SINUS EXPLORATION      Prior to Admission medications   Not on File    Allergies as of 01/29/2017 - Review Complete 12/31/2016  Allergen Reaction Noted  . Augmentin [amoxicillin-pot clavulanate] Hives and Nausea And Vomiting 06/21/2014    Family History  Problem Relation Age of Onset  . Diabetes Father   . Heart disease Father 40  . Stroke Mother     Social History   Socioeconomic History  . Marital status: Married    Spouse name: Not on file  . Number of children: 3  . Years of education: Not on file  . Highest education level: Not on file  Social Needs  . Financial resource strain: Not hard at all  . Food insecurity - worry: Not on file  . Food insecurity - inability: Not on file  . Transportation needs - medical: No  . Transportation needs - non-medical: No  Occupational History  . Occupation: Retired  Tobacco Use  . Smoking status: Current Some Day Smoker    Packs/day: 0.25    Years: 30.00    Pack years: 7.50    Types: Cigarettes  .  Smokeless tobacco: Former Network engineer and Sexual Activity  . Alcohol use: Yes    Comment: Occasional -1x/month  . Drug use: No  . Sexual activity: Yes  Other Topics Concern  . Not on file  Social History Narrative  . Not on file    Review of Systems: See HPI, otherwise negative ROS  Physical Exam: BP 117/69   Pulse 74   Temp 97.9 F (36.6 C) (Temporal)   Ht 5\' 10"  (1.778 m)   Wt 253 lb (114.8 kg)   SpO2 97%   BMI 36.30 kg/m  General:   Alert,  pleasant and cooperative in NAD Head:  Normocephalic and atraumatic. Neck:  Supple; no masses or thyromegaly. Lungs:  Clear throughout to auscultation.    Heart:  Regular rate and rhythm. Abdomen:  Soft, nontender and nondistended. Normal bowel sounds, without guarding, and without rebound.   Neurologic:  Alert and  oriented x4;  grossly normal neurologically.  Impression/Plan: Brett Boone is here for an colonoscopy to be performed for colon cancer screening, surveillance of polyps  Risks, benefits, limitations, and alternatives regarding  colonoscopy have been reviewed with the patient.  Questions have been answered.  All parties agreeable.   [name], MD  02/17/2017, 7:58 AM

## 2017-02-17 NOTE — Anesthesia Postprocedure Evaluation (Signed)
Anesthesia Post Note  Patient: Brett Boone  Procedure(s) Performed: COLONOSCOPY WITH PROPOFOL (N/A Rectum) POLYPECTOMY (Rectum)  Patient location during evaluation: PACU Anesthesia Type: General Level of consciousness: awake and alert Pain management: pain level controlled Vital Signs Assessment: post-procedure vital signs reviewed and stable Respiratory status: spontaneous breathing, nonlabored ventilation and respiratory function stable Cardiovascular status: blood pressure returned to baseline and stable Postop Assessment: no apparent nausea or vomiting Anesthetic complications: no    Mckensi Redinger D Vercie Pokorny

## 2017-02-17 NOTE — Anesthesia Preprocedure Evaluation (Addendum)
Anesthesia Evaluation  Patient identified by MRN, date of birth, ID band Patient awake    Reviewed: Allergy & Precautions, H&P , NPO status , Patient's Chart, lab work & pertinent test results, reviewed documented beta blocker date and time   Airway Mallampati: II  TM Distance: >3 FB Neck ROM: full    Dental no notable dental hx.    Pulmonary shortness of breath, Current Smoker,    Pulmonary exam normal breath sounds clear to auscultation       Cardiovascular Exercise Tolerance: Good +CHF  + Valvular Problems/Murmurs AI  Rhythm:regular Rate:Normal     Neuro/Psych negative neurological ROS  negative psych ROS   GI/Hepatic negative GI ROS, Neg liver ROS,   Endo/Other  negative endocrine ROS  Renal/GU negative Renal ROS  negative genitourinary   Musculoskeletal   Abdominal   Peds  Hematology negative hematology ROS (+)   Anesthesia Other Findings   Reproductive/Obstetrics negative OB ROS                            Anesthesia Physical Anesthesia Plan  ASA: II  Anesthesia Plan: General   Post-op Pain Management:    Induction:   PONV Risk Score and Plan: 1 and Propofol infusion  Airway Management Planned:   Additional Equipment:   Intra-op Plan:   Post-operative Plan:   Informed Consent: I have reviewed the patients History and Physical, chart, labs and discussed the procedure including the risks, benefits and alternatives for the proposed anesthesia with the patient or authorized representative who has indicated his/her understanding and acceptance.     Plan Discussed with: CRNA  Anesthesia Plan Comments:         Anesthesia Quick Evaluation

## 2017-02-17 NOTE — Transfer of Care (Signed)
Immediate Anesthesia Transfer of Care Note  Patient: Rollene Fare III  Procedure(s) Performed: COLONOSCOPY WITH PROPOFOL (N/A Rectum) POLYPECTOMY (Rectum)  Patient Location: PACU  Anesthesia Type: General  Level of Consciousness: awake, alert  and patient cooperative  Airway and Oxygen Therapy: Patient Spontanous Breathing and Patient connected to supplemental oxygen  Post-op Assessment: Post-op Vital signs reviewed, Patient's Cardiovascular Status Stable, Respiratory Function Stable, Patent Airway and No signs of Nausea or vomiting  Post-op Vital Signs: Reviewed and stable  Complications: No apparent anesthesia complications

## 2017-02-17 NOTE — Addendum Note (Signed)
Addendum  created 02/17/17 0906 by Esmeralda Links, MD   Review and Sign - Ready for Procedure

## 2017-02-17 NOTE — Anesthesia Procedure Notes (Signed)
Procedure Name: MAC Date/Time: 02/17/2017 8:06 AM Performed by: Janna Arch, CRNA Pre-anesthesia Checklist: Patient identified, Emergency Drugs available, Suction available and Patient being monitored Patient Re-evaluated:Patient Re-evaluated prior to induction Oxygen Delivery Method: Nasal cannula

## 2017-02-18 ENCOUNTER — Encounter: Payer: Self-pay | Admitting: Gastroenterology

## 2017-02-19 ENCOUNTER — Encounter: Payer: Self-pay | Admitting: Gastroenterology

## 2017-02-19 DIAGNOSIS — H2513 Age-related nuclear cataract, bilateral: Secondary | ICD-10-CM | POA: Diagnosis not present

## 2017-02-23 ENCOUNTER — Other Ambulatory Visit: Payer: Self-pay

## 2017-02-26 ENCOUNTER — Telehealth: Payer: Self-pay | Admitting: Gastroenterology

## 2017-02-26 NOTE — Telephone Encounter (Signed)
Has questions regarding when to come back for colonocopy. 1 yr or 3 yr? Please Call

## 2017-03-01 DIAGNOSIS — H2511 Age-related nuclear cataract, right eye: Secondary | ICD-10-CM | POA: Diagnosis not present

## 2017-03-02 NOTE — Telephone Encounter (Signed)
Left message on patients machine that he needs to have repeat colonoscopy in 3 years (2020)

## 2017-03-08 NOTE — Discharge Instructions (Signed)

## 2017-03-09 ENCOUNTER — Ambulatory Visit: Payer: Medicare Other | Admitting: Anesthesiology

## 2017-03-09 ENCOUNTER — Ambulatory Visit
Admission: RE | Admit: 2017-03-09 | Discharge: 2017-03-09 | Disposition: A | Discharge status: Home / Self Care | Payer: Medicare Other | Source: Ambulatory Visit | Attending: Ophthalmology | Admitting: Ophthalmology

## 2017-03-09 ENCOUNTER — Encounter
Admission: RE | Disposition: A | Discharge status: Home / Self Care | Payer: Self-pay | Source: Ambulatory Visit | Attending: Ophthalmology

## 2017-03-09 DIAGNOSIS — R0602 Shortness of breath: Secondary | ICD-10-CM | POA: Diagnosis not present

## 2017-03-09 DIAGNOSIS — Z8719 Personal history of other diseases of the digestive system: Secondary | ICD-10-CM | POA: Diagnosis not present

## 2017-03-09 DIAGNOSIS — H2511 Age-related nuclear cataract, right eye: Secondary | ICD-10-CM | POA: Diagnosis not present

## 2017-03-09 DIAGNOSIS — Z880 Allergy status to penicillin: Secondary | ICD-10-CM | POA: Insufficient documentation

## 2017-03-09 DIAGNOSIS — F17200 Nicotine dependence, unspecified, uncomplicated: Secondary | ICD-10-CM | POA: Diagnosis not present

## 2017-03-09 DIAGNOSIS — I509 Heart failure, unspecified: Secondary | ICD-10-CM | POA: Insufficient documentation

## 2017-03-09 HISTORY — PX: CATARACT EXTRACTION W/PHACO: SHX586

## 2017-03-09 SURGERY — PHACOEMULSIFICATION, CATARACT, WITH IOL INSERTION
Anesthesia: Monitor Anesthesia Care | Site: Eye | Laterality: Right | Wound class: Clean

## 2017-03-09 MED ORDER — MIDAZOLAM HCL 2 MG/2ML IJ SOLN
INTRAMUSCULAR | Status: DC | PRN
  Administered 2017-03-09 (×2): 1 mg via INTRAVENOUS

## 2017-03-09 MED ORDER — OXYCODONE HCL 5 MG PO TABS: 5.0000 mg | ORAL_TABLET | Freq: Once | ORAL | Status: DC | PRN

## 2017-03-09 MED ORDER — OXYCODONE HCL 5 MG/5ML PO SOLN: 5.0000 mg | Freq: Once | ORAL | Status: DC | PRN

## 2017-03-09 MED ORDER — MEPERIDINE HCL 25 MG/ML IJ SOLN: 6.2500 mg | INTRAMUSCULAR | Status: DC | PRN

## 2017-03-09 MED ORDER — EPINEPHRINE PF 1 MG/ML IJ SOLN
INTRAOCULAR | Status: DC | PRN
  Administered 2017-03-09: 95 mL via OPHTHALMIC

## 2017-03-09 MED ORDER — SODIUM HYALURONATE 23 MG/ML IO SOLN
INTRAOCULAR | Status: DC | PRN
  Administered 2017-03-09: 0.6 mL via INTRAOCULAR

## 2017-03-09 MED ORDER — LACTATED RINGERS IV SOLN: 1000.0000 mL | INTRAVENOUS | Status: DC

## 2017-03-09 MED ORDER — ARMC OPHTHALMIC DILATING DROPS
1.0000 "application " | OPHTHALMIC | Status: DC | PRN
  Administered 2017-03-09 (×3): 1 via OPHTHALMIC

## 2017-03-09 MED ORDER — PROMETHAZINE HCL 25 MG/ML IJ SOLN: 6.2500 mg | INTRAMUSCULAR | Status: DC | PRN

## 2017-03-09 MED ORDER — FENTANYL CITRATE (PF) 100 MCG/2ML IJ SOLN: 25.0000 ug | INTRAMUSCULAR | Status: DC | PRN

## 2017-03-09 MED ORDER — SODIUM HYALURONATE 10 MG/ML IO SOLN
INTRAOCULAR | Status: DC | PRN
  Administered 2017-03-09: 0.55 mL via INTRAOCULAR

## 2017-03-09 MED ORDER — FENTANYL CITRATE (PF) 100 MCG/2ML IJ SOLN
INTRAMUSCULAR | Status: DC | PRN
  Administered 2017-03-09 (×2): 50 ug via INTRAVENOUS

## 2017-03-09 MED ORDER — LIDOCAINE HCL (PF) 2 % IJ SOLN
INTRAOCULAR | Status: DC | PRN
  Administered 2017-03-09: 1 mL via INTRAOCULAR

## 2017-03-09 MED ORDER — MOXIFLOXACIN HCL 0.5 % OP SOLN
OPHTHALMIC | Status: DC | PRN
  Administered 2017-03-09: 0.2 mL via OPHTHALMIC

## 2017-03-09 SURGICAL SUPPLY — 16 items

## 2017-03-09 NOTE — Op Note (Signed)
OPERATIVE NOTE  BRAD LIEURANCE [id number] 03/09/2017   PREOPERATIVE DIAGNOSIS:  Nuclear sclerotic cataract right eye.  H25.11   POSTOPERATIVE DIAGNOSIS:    Nuclear sclerotic cataract right eye.     PROCEDURE:  Phacoemusification with posterior chamber intraocular lens placement of the right eye   LENS:   Implant Name Type Inv. Item Serial No. Manufacturer Lot No. LRB No. Used  LENS IOL DIOP 22.5 - [id number] Intraocular Lens LENS IOL DIOP 22.5 [id number]  Right 1       PCB00 +22.5   ULTRASOUND TIME: 0 minutes 49.6 seconds.  CDE 11.14   SURGEON:  Benay Pillow, MD, MPH  ANESTHESIOLOGIST: Anesthesiologist: Marice Potter, MD CRNA: Cameron Ali, CRNA   ANESTHESIA:  Topical with tetracaine drops augmented with 1% preservative-free intracameral lidocaine.  ESTIMATED BLOOD LOSS: less than 1 mL.   COMPLICATIONS:  None.   DESCRIPTION OF PROCEDURE:  The patient was identified in the holding room and transported to the operating room and placed in the supine position under the operating microscope.  The right eye was identified as the operative eye and it was prepped and draped in the usual sterile ophthalmic fashion.   A 1.0 millimeter clear-corneal paracentesis was made at the 10:30 position. 0.5 ml of preservative-free 1% lidocaine with epinephrine was injected into the anterior chamber.  The anterior chamber was filled with Healon 5 viscoelastic.  A 2.4 millimeter keratome was used to make a near-clear corneal incision at the 8:00 position.  A curvilinear capsulorrhexis was made with a cystotome and capsulorrhexis forceps.  Balanced salt solution was used to hydrodissect and hydrodelineate the nucleus.   Phacoemulsification was then used in stop and chop fashion to remove the lens nucleus and epinucleus.  The remaining cortex was then removed using the irrigation and aspiration handpiece. Healon was then placed into the capsular bag to distend it for lens placement.   A lens was then injected into the capsular bag.  The remaining viscoelastic was aspirated.   Wounds were hydrated with balanced salt solution.  The anterior chamber was inflated to a physiologic pressure with balanced salt solution.   Intracameral vigamox 0.1 mL undiluted was injected into the eye and a drop placed onto the ocular surface.  No wound leaks were noted.  The patient was taken to the recovery room in stable condition without complications of anesthesia or surgery  Benay Pillow 03/09/2017, 9:26 AM

## 2017-03-09 NOTE — Anesthesia Procedure Notes (Signed)
Procedure Name: MAC Date/Time: 03/09/2017 9:01 AM Performed by: Cameron Ali, CRNA Pre-anesthesia Checklist: Patient identified, Emergency Drugs available, Suction available, Timeout performed and Patient being monitored Patient Re-evaluated:Patient Re-evaluated prior to induction Oxygen Delivery Method: Nasal cannula Placement Confirmation: positive ETCO2

## 2017-03-09 NOTE — H&P (Signed)
The History and Physical notes are on paper, have been signed, and are to be scanned.   I have examined the patient and there are no changes to the H&P.   Brett Boone 03/09/2017 8:50 AM

## 2017-03-09 NOTE — Transfer of Care (Signed)
Immediate Anesthesia Transfer of Care Note  Patient: Brett Boone  Procedure(s) Performed: CATARACT EXTRACTION PHACO AND INTRAOCULAR LENS PLACEMENT (IOC) RIGHT (Right Eye)  Patient Location: PACU  Anesthesia Type: MAC  Level of Consciousness: awake, alert  and patient cooperative  Airway and Oxygen Therapy: Patient Spontanous Breathing and Patient connected to supplemental oxygen  Post-op Assessment: Post-op Vital signs reviewed, Patient's Cardiovascular Status Stable, Respiratory Function Stable, Patent Airway and No signs of Nausea or vomiting  Post-op Vital Signs: Reviewed and stable  Complications: No apparent anesthesia complications

## 2017-03-09 NOTE — Anesthesia Preprocedure Evaluation (Signed)
Anesthesia Evaluation  Patient identified by MRN, date of birth, ID band Patient awake    Reviewed: Allergy & Precautions, H&P , NPO status , Patient's Chart, lab work & pertinent test results, reviewed documented beta blocker date and time   Airway Mallampati: II  TM Distance: >3 FB Neck ROM: full    Dental no notable dental hx.    Pulmonary shortness of breath, Current Smoker,    Pulmonary exam normal breath sounds clear to auscultation       Cardiovascular Exercise Tolerance: Good +CHF  + Valvular Problems/Murmurs AI  Rhythm:regular Rate:Normal     Neuro/Psych negative neurological ROS  negative psych ROS   GI/Hepatic negative GI ROS, Neg liver ROS,   Endo/Other  negative endocrine ROS  Renal/GU negative Renal ROS  negative genitourinary   Musculoskeletal   Abdominal   Peds  Hematology negative hematology ROS (+)   Anesthesia Other Findings   Reproductive/Obstetrics negative OB ROS                             Anesthesia Physical  Anesthesia Plan  ASA: II  Anesthesia Plan: MAC   Post-op Pain Management:    Induction:   PONV Risk Score and Plan: 1 and Propofol infusion  Airway Management Planned:   Additional Equipment:   Intra-op Plan:   Post-operative Plan:   Informed Consent: I have reviewed the patients History and Physical, chart, labs and discussed the procedure including the risks, benefits and alternatives for the proposed anesthesia with the patient or authorized representative who has indicated his/her understanding and acceptance.     Plan Discussed with: CRNA  Anesthesia Plan Comments:         Anesthesia Quick Evaluation

## 2017-03-09 NOTE — Anesthesia Postprocedure Evaluation (Signed)
Anesthesia Post Note  Patient: Brett Boone  Procedure(s) Performed: CATARACT EXTRACTION PHACO AND INTRAOCULAR LENS PLACEMENT (IOC) RIGHT (Right Eye)  Patient location during evaluation: PACU Anesthesia Type: MAC Level of consciousness: awake and alert Pain management: pain level controlled Vital Signs Assessment: post-procedure vital signs reviewed and stable Respiratory status: spontaneous breathing, nonlabored ventilation, respiratory function stable and patient connected to nasal cannula oxygen Cardiovascular status: blood pressure returned to baseline and stable Postop Assessment: no apparent nausea or vomiting Anesthetic complications: no    Lequita Meadowcroft ELAINE

## 2017-04-21 ENCOUNTER — Telehealth: Payer: Self-pay | Admitting: *Deleted

## 2017-04-21 ENCOUNTER — Other Ambulatory Visit: Payer: Self-pay

## 2017-04-21 DIAGNOSIS — Z122 Encounter for screening for malignant neoplasm of respiratory organs: Secondary | ICD-10-CM

## 2017-04-21 DIAGNOSIS — Z87891 Personal history of nicotine dependence: Secondary | ICD-10-CM

## 2017-04-21 NOTE — Telephone Encounter (Signed)
Received referral for initial lung cancer screening scan. Contacted patient and obtained smoking history,(current, 30 pack year) as well as answering questions related to screening process. Patient denies signs of lung cancer such as weight loss or hemoptysis. Patient denies comorbidity that would prevent curative treatment if lung cancer were found. Patient is scheduled for shared decision making visit and CT scan on 05/04/17.

## 2017-05-04 ENCOUNTER — Inpatient Hospital Stay: Payer: Medicare Other | Attending: Oncology | Admitting: Oncology

## 2017-05-04 ENCOUNTER — Ambulatory Visit
Admission: RE | Admit: 2017-05-04 | Discharge: 2017-05-04 | Disposition: A | Discharge status: Home / Self Care | Payer: Medicare Other | Source: Ambulatory Visit | Attending: Oncology | Admitting: Oncology

## 2017-05-04 ENCOUNTER — Encounter: Payer: Self-pay | Admitting: Oncology

## 2017-05-04 DIAGNOSIS — I712 Thoracic aortic aneurysm, without rupture: Secondary | ICD-10-CM | POA: Insufficient documentation

## 2017-05-04 DIAGNOSIS — J432 Centrilobular emphysema: Secondary | ICD-10-CM | POA: Insufficient documentation

## 2017-05-04 DIAGNOSIS — J438 Other emphysema: Secondary | ICD-10-CM | POA: Diagnosis not present

## 2017-05-04 DIAGNOSIS — Z122 Encounter for screening for malignant neoplasm of respiratory organs: Secondary | ICD-10-CM

## 2017-05-04 DIAGNOSIS — I700 Atherosclerosis of aorta: Secondary | ICD-10-CM | POA: Diagnosis not present

## 2017-05-04 DIAGNOSIS — Z87891 Personal history of nicotine dependence: Secondary | ICD-10-CM

## 2017-05-04 IMAGING — CT CT CHEST LUNG CANCER SCREENING LOW DOSE W/O CM
2 of 5 series · 15 of 40 positions shown, 18 images · non-contrast
Comparison: No priors.

CLINICAL DATA: 71-year-old male current smoker with 30 pack-year
history of smoking. Lung cancer screening examination.

EXAM:
CT CHEST WITHOUT CONTRAST LOW-DOSE FOR LUNG CANCER SCREENING
TECHNIQUE: Multidetector CT imaging of the chest was performed following the
standard protocol without IV contrast.

[Series 3: lung · axial · 0.75mm/px · z∈[-1189,-901]mm · 12 of 321 slices shown, 15 images (1 of 2)]
[im 17/321  mediastinal]
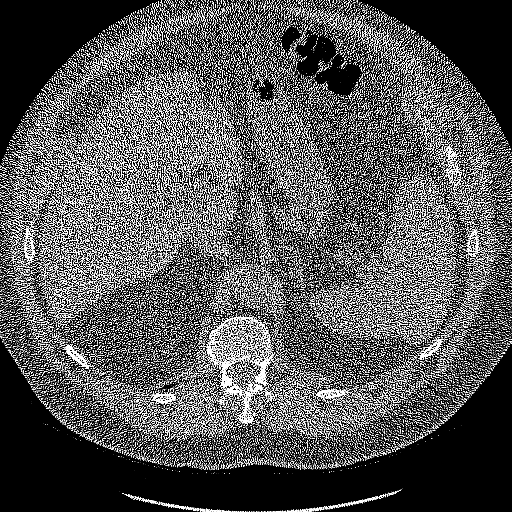
[im 17/321  lung]
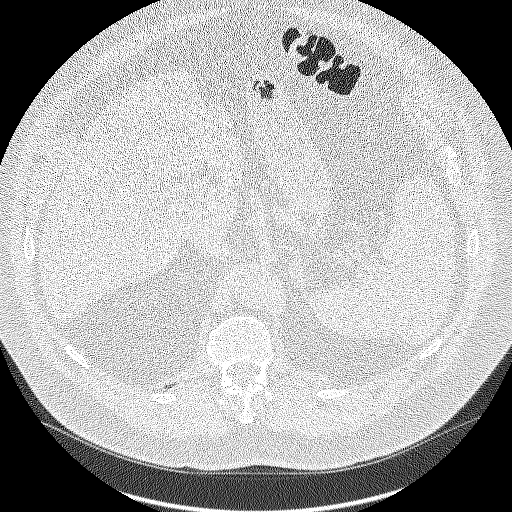
[im 49/321  lung]
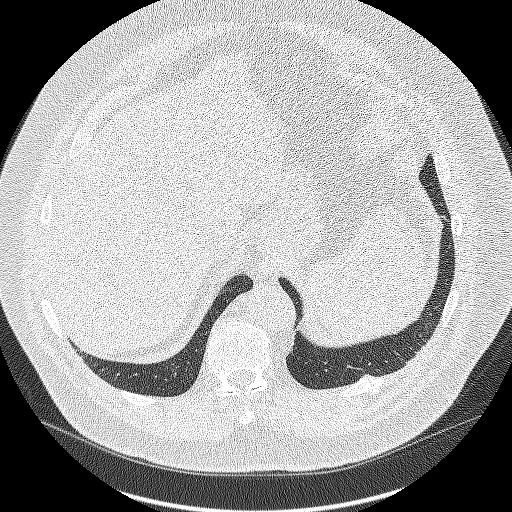
[im 65/321  lung]
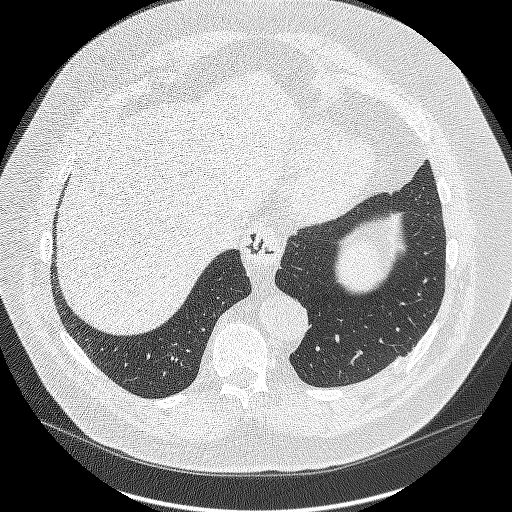
[im 97/321  lung]
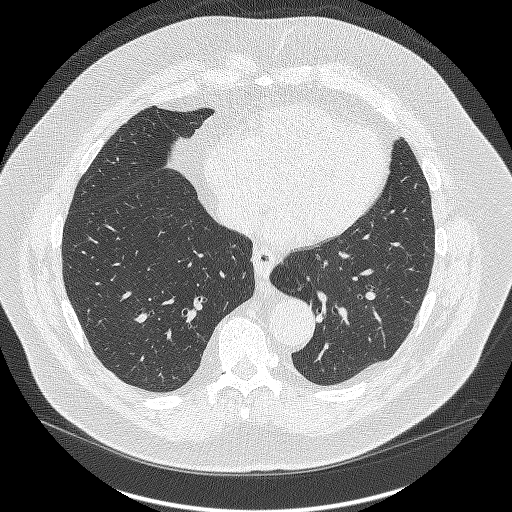
[im 129/321  mediastinal]
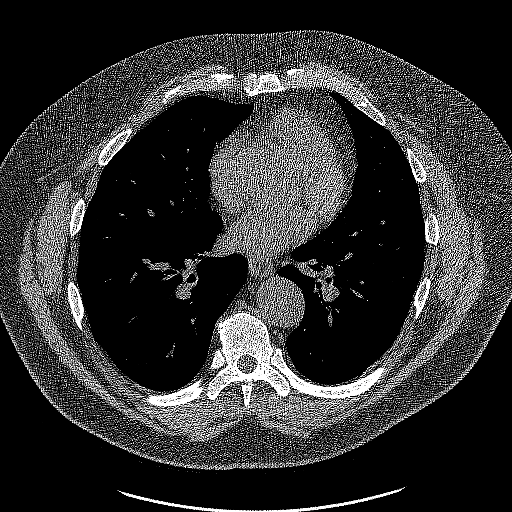
[im 129/321  lung]
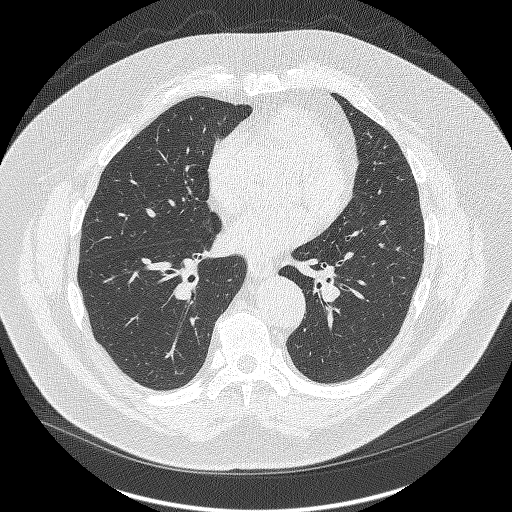
[im 145/321  lung]
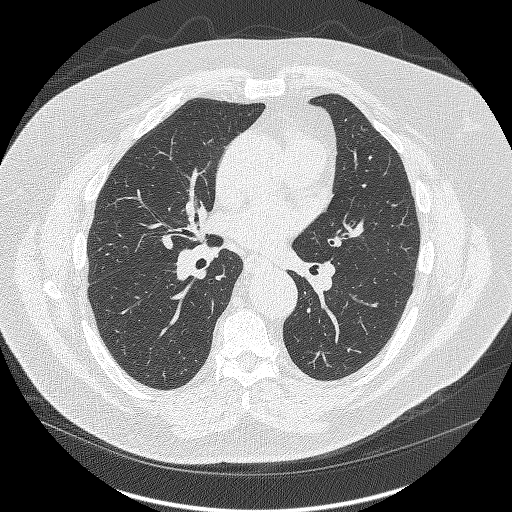
[im 177/321  lung]
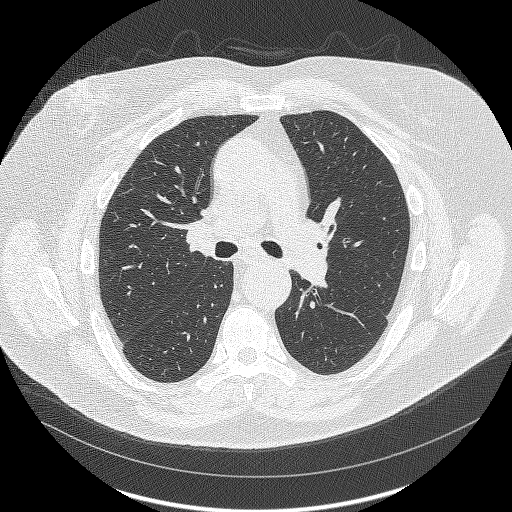
[im 193/321  lung]
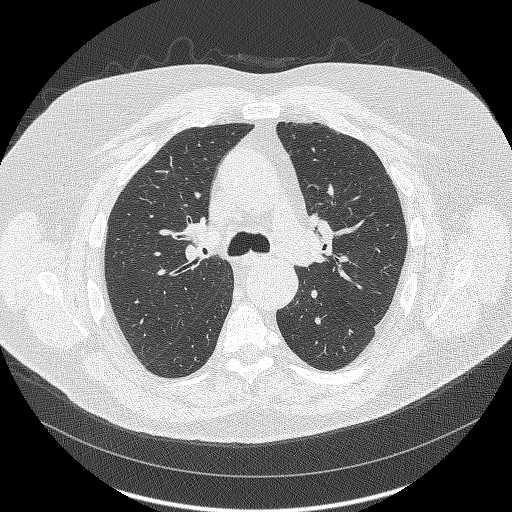
[im 225/321  mediastinal]
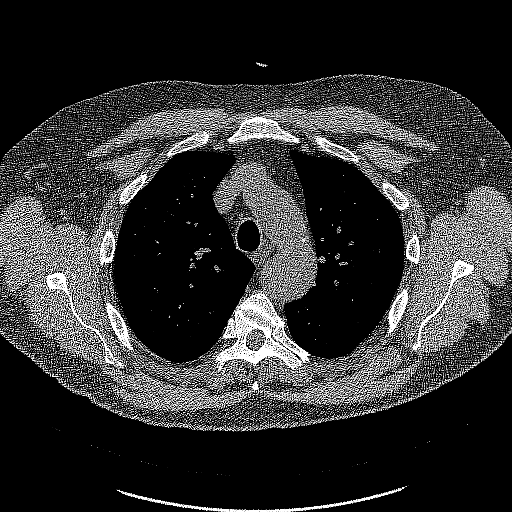
[im 225/321  lung]
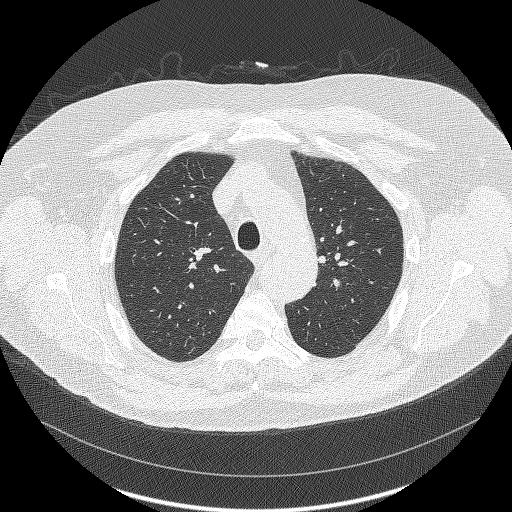
[im 257/321  lung]
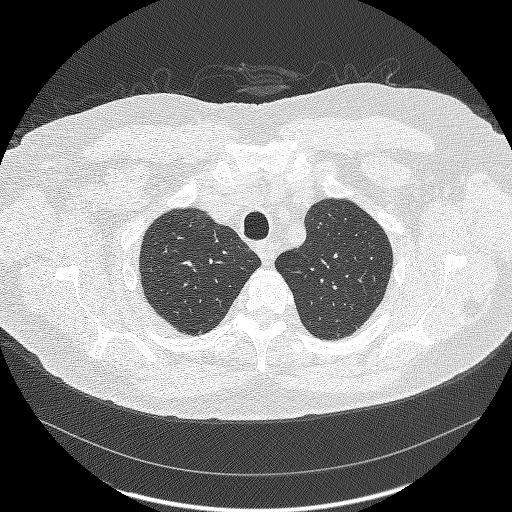
[im 273/321  lung]
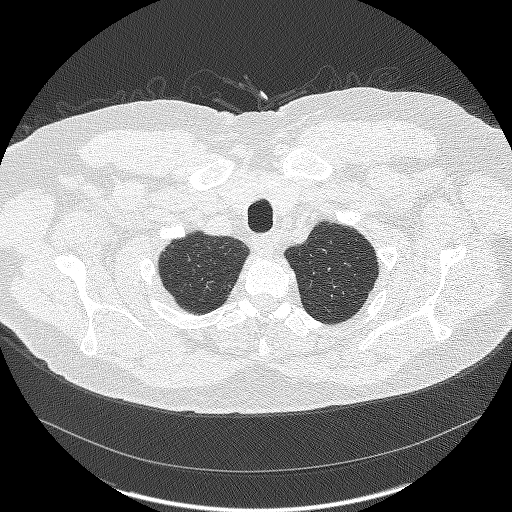
[im 305/321  lung]
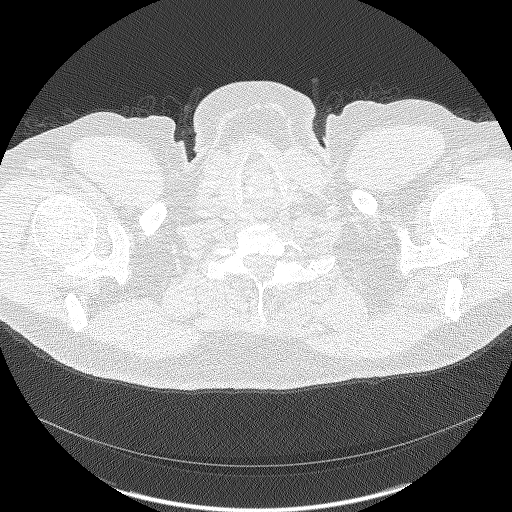

[Series 4: lung · coronal · 0.71mm/px · 3 of 364 slices shown (2 of 2)]
[im 73/364  lung]
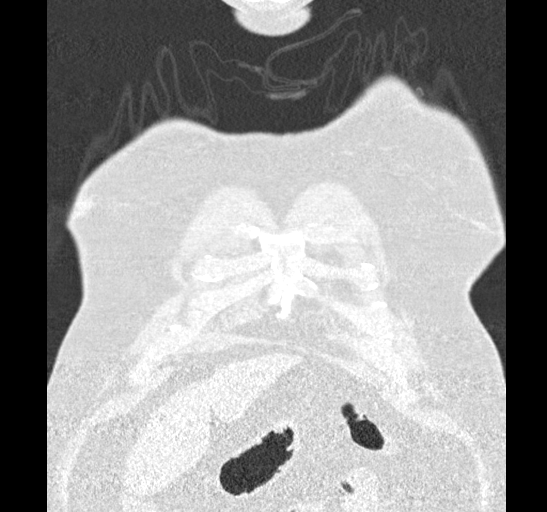
[im 146/364  lung]
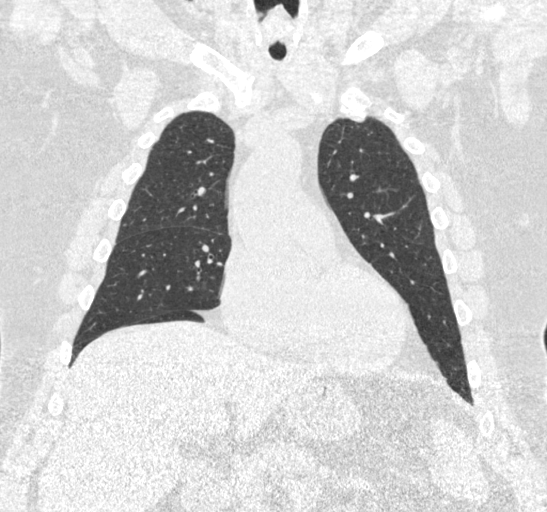
[im 218/364  lung]
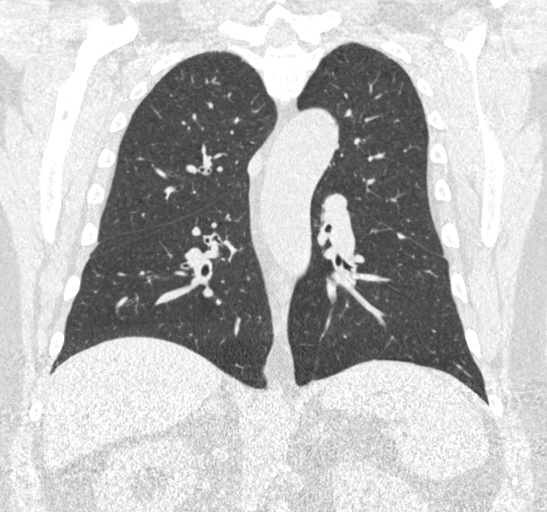

[15 of 40 positions shown; findings below may reference images not displayed]

FINDINGS: Cardiovascular: Heart size is normal. There is no significant
pericardial fluid, thickening or pericardial calcification. Aortic
atherosclerosis with mild aneurysmal dilatation of the ascending
thoracic aorta which measures up to 4.6 cm in diameter. Proximal
descending thoracic aorta is also ectatic measuring 4 cm in
diameter. No coronary artery calcifications.

Mediastinum/Nodes: No pathologically enlarged mediastinal or hilar
lymph nodes. Please note that accurate exclusion of hilar adenopathy
is limited on noncontrast CT scans. Small hiatal hernia. No axillary
lymphadenopathy.

Lungs/Pleura: Multiple small pulmonary nodules are scattered
throughout the lungs bilaterally, the largest of which is in the
anterior aspect of the right lower lobe (axial image 193 of series
3) with a volume derived mean diameter of 5.3 mm. No larger more
suspicious appearing pulmonary nodules or masses are noted. No acute
consolidative airspace disease. No pleural effusions. Mild diffuse
bronchial wall thickening with mild centrilobular and paraseptal
emphysema.

Upper Abdomen: Aortic atherosclerosis.

Musculoskeletal: Posterior rib fractures of the left tenth and
eleventh ribs, with what appears to be chronic nonunion of the left
tenth rib, and fibrous union of the left eleventh rib. There are no
aggressive appearing lytic or blastic lesions noted in the
visualized portions of the skeleton.
IMPRESSION: 1. Lung-RADS 2S, benign appearance or behavior. Continue annual
screening with low-dose chest CT without contrast in 12 months.
2. The "S" modifier above refers to potentially clinically
significant non lung cancer related findings. Specifically, aortic
atherosclerosis with aneurysmal dilatation of ascending thoracic
aorta which measures up to 4.6 cm in diameter. Ascending thoracic
aortic aneurysm. Recommend semi-annual imaging followup by CTA or
MRA and referral to cardiothoracic surgery if not already obtained.
This recommendation follows [EX]
ACCF/AHA/AATS/ACR/ASA/SCA/ROBERTO/ROBERTO/ROBERTO/ROBERTO Guidelines for the
Diagnosis and Management of Patients With Thoracic Aortic Disease.
Circulation. [EX]; 121: [id number].
3. Mild diffuse bronchial wall thickening with mild centrilobular
and paraseptal emphysema; imaging findings suggestive of underlying
COPD.
4. Additional incidental findings, as above.

Aortic Atherosclerosis ([EX]-[EX]) and Emphysema ([EX]-[EX]).

## 2017-05-04 NOTE — Progress Notes (Signed)
In accordance with CMS guidelines, patient has met eligibility criteria including age, absence of signs or symptoms of lung cancer.  Social History   Tobacco Use  . Smoking status: Current Every Day Smoker    Packs/day: 0.60    Years: 50.00    Pack years: 30.00    Types: Cigarettes  . Smokeless tobacco: Former Network engineer Use Topics  . Alcohol use: Yes    Comment: Occasional -1x/month  . Drug use: No     A shared decision-making session was conducted prior to the performance of CT scan. This includes one or more decision aids, includes benefits and harms of screening, follow-up diagnostic testing, over-diagnosis, false positive rate, and total radiation exposure.  Counseling on the importance of adherence to annual lung cancer LDCT screening, impact of co-morbidities, and ability or willingness to undergo diagnosis and treatment is imperative for compliance of the program.  Counseling on the importance of continued smoking cessation for former smokers; the importance of smoking cessation for current smokers, and information about tobacco cessation interventions have been given to patient including [place] and [facility] programs.  Written order for lung cancer screening with LDCT has been given to the patient and any and all questions have been answered to the best of my abilities.   Yearly follow up will be coordinated by Burgess Estelle, Thoracic Navigator.  Faythe Casa, NP 05/04/2017 10:38 AM

## 2017-05-07 ENCOUNTER — Telehealth: Payer: Self-pay | Admitting: *Deleted

## 2017-05-07 NOTE — Telephone Encounter (Signed)
Notified patient of LDCT lung cancer screening program results with recommendation for 12 month follow up imaging. Also notified of incidental findings noted below and is encouraged to discuss further with PCP who will receive a copy of this note and/or the CT report. Patient verbalizes understanding.   IMPRESSION: 1. Lung-RADS 2S, benign appearance or behavior. Continue annual screening with low-dose chest CT without contrast in 12 months. 2. The "S" modifier above refers to potentially clinically significant non lung cancer related findings. Specifically, aortic atherosclerosis with aneurysmal dilatation of ascending thoracic aorta which measures up to 4.6 cm in diameter. Ascending thoracic aortic aneurysm. Recommend semi-annual imaging followup by CTA or MRA and referral to cardiothoracic surgery if not already obtained. This recommendation follows 2010 ACCF/AHA/AATS/ACR/ASA/SCA/SCAI/SIR/STS/SVM Guidelines for the Diagnosis and Management of Patients With Thoracic Aortic Disease. Circulation. 2010; 121: [id number]. 3. Mild diffuse bronchial wall thickening with mild centrilobular and paraseptal emphysema; imaging findings suggestive of underlying COPD. 4. Additional incidental findings, as above.  Aortic Atherosclerosis (ICD10-I70.0) and Emphysema (ICD10-J43.9).

## 2017-10-22 DIAGNOSIS — H903 Sensorineural hearing loss, bilateral: Secondary | ICD-10-CM | POA: Diagnosis not present

## 2017-12-15 ENCOUNTER — Ambulatory Visit (INDEPENDENT_AMBULATORY_CARE_PROVIDER_SITE_OTHER): Payer: Medicare Other

## 2017-12-15 VITALS — BP 122/82 | HR 72 | Temp 98.1°F | Resp 16 | Ht 70.0 in | Wt 256.8 lb

## 2017-12-15 DIAGNOSIS — Z23 Encounter for immunization: Secondary | ICD-10-CM

## 2017-12-15 DIAGNOSIS — Z0000 Encounter for general adult medical examination without abnormal findings: Secondary | ICD-10-CM | POA: Diagnosis not present

## 2017-12-15 NOTE — Patient Instructions (Signed)
Brett Boone , Thank you for taking time to come for your Medicare Wellness Visit. I appreciate your ongoing commitment to your health goals. Please review the following plan we discussed and let me know if I can assist you in the future.   Screening recommendations/referrals: Colonoscopy: done 02/17/17  Recommended yearly ophthalmology/optometry visit for glaucoma screening and checkup Recommended yearly dental visit for hygiene and checkup  Vaccinations: Influenza vaccine: done today Pneumococcal vaccine: done 12/14/16 Tdap vaccine: done 08/30/14 Shingles vaccine: Shingrix discussed. Please contact your insurance company for coverage information.   Advanced directives: Please bring a copy of your health care power of attorney and living will to the office at your convenience.  Conditions/risks identified: Recommend healthy eating habits and increase physical activity.  Next appointment: 01/04/18 10:40 Dr. Ronnald Ramp  Preventive Care 71 Years and Older, Male Preventive care refers to lifestyle choices and visits with your health care provider that can promote health and wellness. What does preventive care include?  A yearly physical exam. This is also called an annual well check.  Dental exams once or twice a year.  Routine eye exams. Ask your health care provider how often you should have your eyes checked.  Personal lifestyle choices, including:  Daily care of your teeth and gums.  Regular physical activity.  Eating a healthy diet.  Avoiding tobacco and drug use.  Limiting alcohol use.  Practicing safe sex.  Taking low doses of aspirin every day.  Taking vitamin and mineral supplements as recommended by your health care provider. What happens during an annual well check? The services and screenings done by your health care provider during your annual well check will depend on your age, overall health, lifestyle risk factors, and family history of disease. Counseling  Your  health care provider may ask you questions about your:  Alcohol use.  Tobacco use.  Drug use.  Emotional well-being.  Home and relationship well-being.  Sexual activity.  Eating habits.  History of falls.  Memory and ability to understand (cognition).  Work and work Statistician. Screening  You may have the following tests or measurements:  Height, weight, and BMI.  Blood pressure.  Lipid and cholesterol levels. These may be checked every 5 years, or more frequently if you are over 33 years old.  Skin check.  Lung cancer screening. You may have this screening every year starting at age 71 if you have a 30-pack-year history of smoking and currently smoke or have quit within the past 15 years.  Fecal occult blood test (FOBT) of the stool. You may have this test every year starting at age 71.  Flexible sigmoidoscopy or colonoscopy. You may have a sigmoidoscopy every 5 years or a colonoscopy every 10 years starting at age 71.  Prostate cancer screening. Recommendations will vary depending on your family history and other risks.  Hepatitis C blood test.  Hepatitis B blood test.  Sexually transmitted disease (STD) testing.  Diabetes screening. This is done by checking your blood sugar (glucose) after you have not eaten for a while (fasting). You may have this done every 1-3 years.  Abdominal aortic aneurysm (AAA) screening. You may need this if you are a current or former smoker.  Osteoporosis. You may be screened starting at age 71 if you are at high risk. Talk with your health care provider about your test results, treatment options, and if necessary, the need for more tests. Vaccines  Your health care provider may recommend certain vaccines, such as:  Influenza  vaccine. This is recommended every year.  Tetanus, diphtheria, and acellular pertussis (Tdap, Td) vaccine. You may need a Td booster every 10 years.  Zoster vaccine. You may need this after age  23.  Pneumococcal 13-valent conjugate (PCV13) vaccine. One dose is recommended after age 71.  Pneumococcal polysaccharide (PPSV23) vaccine. One dose is recommended after age 71. Talk to your health care provider about which screenings and vaccines you need and how often you need them. This information is not intended to replace advice given to you by your health care provider. Make sure you discuss any questions you have with your health care provider. Document Released: 02/08/2015 Document Revised: 10/02/2015 Document Reviewed: 11/13/2014 Elsevier Interactive Patient Education  2017 [place] Prevention in the Home Falls can cause injuries. They can happen to people of all ages. There are many things you can do to make your home safe and to help prevent falls. What can I do on the outside of my home?  Regularly fix the edges of walkways and driveways and fix any cracks.  Remove anything that might make you trip as you walk through a door, such as a raised step or threshold.  Trim any bushes or trees on the path to your home.  Use bright outdoor lighting.  Clear any walking paths of anything that might make someone trip, such as rocks or tools.  Regularly check to see if handrails are loose or broken. Make sure that both sides of any steps have handrails.  Any raised decks and porches should have guardrails on the edges.  Have any leaves, snow, or ice cleared regularly.  Use sand or salt on walking paths during winter.  Clean up any spills in your garage right away. This includes oil or grease spills. What can I do in the bathroom?  Use night lights.  Install grab bars by the toilet and in the tub and shower. Do not use towel bars as grab bars.  Use non-skid mats or decals in the tub or shower.  If you need to sit down in the shower, use a plastic, non-slip stool.  Keep the floor dry. Clean up any water that spills on the floor as soon as it happens.  Remove  soap buildup in the tub or shower regularly.  Attach bath mats securely with double-sided non-slip rug tape.  Do not have throw rugs and other things on the floor that can make you trip. What can I do in the bedroom?  Use night lights.  Make sure that you have a light by your bed that is easy to reach.  Do not use any sheets or blankets that are too big for your bed. They should not hang down onto the floor.  Have a firm chair that has side arms. You can use this for support while you get dressed.  Do not have throw rugs and other things on the floor that can make you trip. What can I do in the kitchen?  Clean up any spills right away.  Avoid walking on wet floors.  Keep items that you use a lot in easy-to-reach places.  If you need to reach something above you, use a strong step stool that has a grab bar.  Keep electrical cords out of the way.  Do not use floor polish or wax that makes floors slippery. If you must use wax, use non-skid floor wax.  Do not have throw rugs and other things on the floor that can  make you trip. What can I do with my stairs?  Do not leave any items on the stairs.  Make sure that there are handrails on both sides of the stairs and use them. Fix handrails that are broken or loose. Make sure that handrails are as long as the stairways.  Check any carpeting to make sure that it is firmly attached to the stairs. Fix any carpet that is loose or worn.  Avoid having throw rugs at the top or bottom of the stairs. If you do have throw rugs, attach them to the floor with carpet tape.  Make sure that you have a light switch at the top of the stairs and the bottom of the stairs. If you do not have them, ask someone to add them for you. What else can I do to help prevent falls?  Wear shoes that:  Do not have high heels.  Have rubber bottoms.  Are comfortable and fit you well.  Are closed at the toe. Do not wear sandals.  If you use a  stepladder:  Make sure that it is fully opened. Do not climb a closed stepladder.  Make sure that both sides of the stepladder are locked into place.  Ask someone to hold it for you, if possible.  Clearly mark and make sure that you can see:  Any grab bars or handrails.  First and last steps.  Where the edge of each step is.  Use tools that help you move around (mobility aids) if they are needed. These include:  Canes.  Walkers.  Scooters.  Crutches.  Turn on the lights when you go into a dark area. Replace any light bulbs as soon as they burn out.  Set up your furniture so you have a clear path. Avoid moving your furniture around.  If any of your floors are uneven, fix them.  If there are any pets around you, be aware of where they are.  Review your medicines with your doctor. Some medicines can make you feel dizzy. This can increase your chance of falling. Ask your doctor what other things that you can do to help prevent falls. This information is not intended to replace advice given to you by your health care provider. Make sure you discuss any questions you have with your health care provider. Document Released: 11/08/2008 Document Revised: 06/20/2015 Document Reviewed: 02/16/2014 Elsevier Interactive Patient Education  2017 [employer]Influenza (Flu) Vaccine (Inactivated or Recombinant): What You Need to Know 1. Why get vaccinated? Influenza ("flu") is a contagious disease that spreads around the [place] every year, usually between October and May. Flu is caused by influenza viruses, and is spread mainly by coughing, sneezing, and close contact. Anyone can get flu. Flu strikes suddenly and can last several days. Symptoms vary by age, but can include:  fever/chills  sore throat  muscle aches  fatigue  cough  headache  runny or stuffy nose  Flu can also lead to pneumonia and blood infections, and cause diarrhea and seizures in children. If you  have a medical condition, such as heart or lung disease, flu can make it worse. Flu is more dangerous for some people. Infants and young children, people 65 years of age and older, pregnant women, and people with certain health conditions or a weakened immune system are at greatest risk. Each year thousands of people in the [place] die from flu, and many more are hospitalized. Flu vaccine can:  keep you from getting flu,  make flu less severe if you do get it, and  keep you from spreading flu to your family and other people. 2. Inactivated and recombinant flu vaccines A dose of flu vaccine is recommended every flu season. Children 6 months through 6 years of age may need two doses during the same flu season. Everyone else needs only one dose each flu season. Some inactivated flu vaccines contain a very small amount of a mercury-based preservative called thimerosal. Studies have not shown thimerosal in vaccines to be harmful, but flu vaccines that do not contain thimerosal are available. There is no live flu virus in flu shots. They cannot cause the flu. There are many flu viruses, and they are always changing. Each year a new flu vaccine is made to protect against three or four viruses that are likely to cause disease in the upcoming flu season. But even when the vaccine doesn't exactly match these viruses, it may still provide some protection. Flu vaccine cannot prevent:  flu that is caused by a virus not covered by the vaccine, or  illnesses that look like flu but are not.  It takes about 2 weeks for protection to develop after vaccination, and protection lasts through the flu season. 3. Some people should not get this vaccine Tell the person who is giving you the vaccine:  If you have any severe, life-threatening allergies. If you ever had a life-threatening allergic reaction after a dose of flu vaccine, or have a severe allergy to any part of this vaccine, you may be advised not to  get vaccinated. Most, but not all, types of flu vaccine contain a small amount of egg protein.  If you ever had Guillain-Barr Syndrome (also called GBS). Some people with a history of GBS should not get this vaccine. This should be discussed with your doctor.  If you are not feeling well. It is usually okay to get flu vaccine when you have a mild illness, but you might be asked to come back when you feel better.  4. Risks of a vaccine reaction With any medicine, including vaccines, there is a chance of reactions. These are usually mild and go away on their own, but serious reactions are also possible. Most people who get a flu shot do not have any problems with it. Minor problems following a flu shot include:  soreness, redness, or swelling where the shot was given  hoarseness  sore, red or itchy eyes  cough  fever  aches  headache  itching  fatigue  If these problems occur, they usually begin soon after the shot and last 1 or 2 days. More serious problems following a flu shot can include the following:  There may be a small increased risk of Guillain-Barre Syndrome (GBS) after inactivated flu vaccine. This risk has been estimated at 1 or 2 additional cases per million people vaccinated. This is much lower than the risk of severe complications from flu, which can be prevented by flu vaccine.  Young children who get the flu shot along with pneumococcal vaccine (PCV13) and/or DTaP vaccine at the same time might be slightly more likely to have a seizure caused by fever. Ask your doctor for more information. Tell your doctor if a child who is getting flu vaccine has ever had a seizure.  Problems that could happen after any injected vaccine:  People sometimes faint after a medical procedure, including vaccination. Sitting or lying down for about 15 minutes can help prevent fainting, and injuries caused by  a fall. Tell your doctor if you feel dizzy, or have vision changes or ringing  in the ears.  Some people get severe pain in the shoulder and have difficulty moving the arm where a shot was given. This happens very rarely.  Any medication can cause a severe allergic reaction. Such reactions from a vaccine are very rare, estimated at about 1 in a million doses, and would happen within a few minutes to a few hours after the vaccination. As with any medicine, there is a very remote chance of a vaccine causing a serious injury or death. The safety of vaccines is always being monitored. For more information, visit: http://www.aguilar.org/ 5. What if there is a serious reaction? What should I look for? Look for anything that concerns you, such as signs of a severe allergic reaction, very high fever, or unusual behavior. Signs of a severe allergic reaction can include hives, swelling of the face and throat, difficulty breathing, a fast heartbeat, dizziness, and weakness. These would start a few minutes to a few hours after the vaccination. What should I do?  If you think it is a severe allergic reaction or other emergency that can't wait, call 9-1-1 and get the person to the nearest hospital. Otherwise, call your doctor.  Reactions should be reported to the Vaccine Adverse Event Reporting System (VAERS). Your doctor should file this report, or you can do it yourself through the VAERS web site at www.vaers.SamedayNews.es, or by calling 303 045 1855. ? VAERS does not give medical advice. 6. The National Vaccine Injury Compensation Program The [employer] Vaccine Injury Compensation Program (VICP) is a federal program that was created to compensate people who may have been injured by certain vaccines. Persons who believe they may have been injured by a vaccine can learn about the program and about filing a claim by calling (870) 322-8156 or visiting the [place] [employer] website at GoldCloset.com.ee. There is a time limit to file a claim for compensation. 7. How can I learn more?  Ask  your healthcare provider. He or she can give you the vaccine package insert or suggest other sources of information.  Call your local or state health department.  Contact the Centers for Disease Control and Prevention (CDC): ? Call 201-801-9192 (1-800-CDC-INFO) or ? Visit CDC's website at https://gibson.com/ Vaccine Information Statement, Inactivated Influenza Vaccine (09/01/2013) This information is not intended to replace advice given to you by your health care provider. Make sure you discuss any questions you have with your health care provider. Document Released: 11/06/2005 Document Revised: 10/03/2015 Document Reviewed: 10/03/2015 Elsevier Interactive Patient Education  2017 [employer].
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
## 2017-12-16 ENCOUNTER — Ambulatory Visit: Payer: Medicare Other

## 2018-01-03 ENCOUNTER — Encounter: Payer: Medicare Other | Admitting: Family Medicine

## 2018-01-04 ENCOUNTER — Encounter: Payer: Self-pay | Admitting: Family Medicine

## 2018-01-04 ENCOUNTER — Ambulatory Visit (INDEPENDENT_AMBULATORY_CARE_PROVIDER_SITE_OTHER): Payer: Medicare Other | Admitting: Family Medicine

## 2018-01-04 VITALS — BP 118/60 | HR 80 | Ht 70.0 in | Wt 257.0 lb

## 2018-01-04 DIAGNOSIS — F17200 Nicotine dependence, unspecified, uncomplicated: Secondary | ICD-10-CM | POA: Diagnosis not present

## 2018-01-04 DIAGNOSIS — E785 Hyperlipidemia, unspecified: Secondary | ICD-10-CM | POA: Diagnosis not present

## 2018-01-04 DIAGNOSIS — Z0000 Encounter for general adult medical examination without abnormal findings: Secondary | ICD-10-CM

## 2018-01-04 DIAGNOSIS — Z6836 Body mass index (BMI) 36.0-36.9, adult: Secondary | ICD-10-CM

## 2018-01-04 DIAGNOSIS — R351 Nocturia: Secondary | ICD-10-CM | POA: Diagnosis not present

## 2018-01-04 DIAGNOSIS — I119 Hypertensive heart disease without heart failure: Secondary | ICD-10-CM

## 2018-01-04 NOTE — Progress Notes (Signed)
Date:  01/04/2018   Name:  Brett Boone   DOB:  20-Nov-1945   MRN:  [id number]   Chief Complaint: Annual Exam (discoloration of feet and legs) Patient is a 72 year old male who presents for a comprehensive physical exam. The patient reports the following problems: none. Health maintenance has been reviewed up to date.    Review of Systems  Constitutional: Negative for appetite change, chills, fatigue, fever and unexpected weight change.  HENT: Negative for drooling, ear discharge, ear pain, facial swelling, hearing loss, nosebleeds, sneezing, sore throat and trouble swallowing.   Eyes: Negative for photophobia, pain, discharge, redness, itching and visual disturbance.  Respiratory: Negative for cough, choking, chest tightness, shortness of breath and wheezing.   Cardiovascular: Negative for chest pain, palpitations and leg swelling.  Gastrointestinal: Negative for abdominal pain, blood in stool, constipation, diarrhea, nausea, rectal pain and vomiting.  Endocrine: Negative for cold intolerance, heat intolerance, polydipsia, polyphagia and polyuria.  Genitourinary: Negative for decreased urine volume, difficulty urinating, discharge, dysuria, flank pain, frequency, hematuria, penile pain, penile swelling, scrotal swelling, testicular pain and urgency.  Musculoskeletal: Negative for back pain, joint swelling, myalgias, neck pain and neck stiffness.  Skin: Negative for color change and rash.  Allergic/Immunologic: Negative for environmental allergies and immunocompromised state.  Neurological: Negative for dizziness, tremors, seizures, syncope, speech difficulty, weakness, light-headedness, numbness and headaches.  Hematological: Does not bruise/bleed easily.  Psychiatric/Behavioral: Negative for agitation, behavioral problems, confusion, dysphoric mood, hallucinations, self-injury and suicidal ideas. The patient is not nervous/anxious.     Patient Active Problem List   Diagnosis  Date Noted  . History of adenomatous polyp of colon   . Cigarette nicotine dependence without complication 24/09/7351  . 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 LVH (left ventricular hypertrophy) due to hypertensive disease, without heart failure 09/18/2014  . Shortness of breath 09/03/2014  . Annual physical exam 08/30/2014  . 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 Amoxicillin-Pot Clavulanate Hives and Nausea And Vomiting    Past Surgical History:  Procedure Laterality Date  . CATARACT EXTRACTION W/PHACO Right 03/09/2017   Procedure: CATARACT EXTRACTION PHACO AND INTRAOCULAR LENS PLACEMENT ([place]) RIGHT;  Surgeon: Eulogio Bear, MD;  Location: [place];  Service: Ophthalmology;  Laterality: Right;  . COLONOSCOPY  2013   normal- cleared for 5 yrs- Dr Beckey Downing  . COLONOSCOPY WITH PROPOFOL N/A 02/17/2017   Procedure: COLONOSCOPY WITH PROPOFOL;  Surgeon: Lin Landsman, MD;  Location: [place];  Service: Endoscopy;  Laterality: N/A;  . EPIGASTRIC HERNIA REPAIR N/A 07/06/2014   Procedure: HERNIA REPAIR EPIGASTRIC ADULT;  Surgeon: Molly Maduro, MD;  Location: [facility];  Service: General;  Laterality: N/A;  . HERNIA REPAIR Bilateral   . INSERTION OF MESH N/A 07/06/2014   Procedure: INSERTION OF MESH;  Surgeon: Molly Maduro, MD;  Location: [facility];  Service: General;  Laterality: N/A;  . POLYPECTOMY  02/17/2017   Procedure: POLYPECTOMY;  Surgeon: Lin Landsman, MD;  Location: [place];  Service: Endoscopy;;  . SHOULDER ARTHROSCOPY Left 2008  . SINUS EXPLORATION      Social History   Tobacco Use  . Smoking status: Current Every Day Smoker    Packs/day: 0.25    Years: 50.00    Pack years: 12.50    Types: Cigarettes  . Smokeless tobacco: Former Network engineer Use Topics  . Alcohol use: Yes    Comment: Occasional -1x/month  . Drug use: No  Medication list has been reviewed and updated.  No outpatient  medications have been marked as taking for the 01/04/18 encounter (Office Visit) with [name], MD.    [facility] 2/9 Scores 12/15/2017 12/14/2016 01/24/2015 08/30/2014  PHQ - 2 Score 0 0 0 0  PHQ- 9 Score - 0 - -    Physical Exam  Constitutional: He is oriented to person, place, and time.  HENT:  Head: Normocephalic.  Right Ear: External ear normal.  Left Ear: External ear normal.  Nose: Nose normal.  Mouth/Throat: Oropharynx is clear and moist.  Eyes: Pupils are equal, round, and reactive to light. Conjunctivae and EOM are normal. Right eye exhibits no discharge. Left eye exhibits no discharge. No scleral icterus.  Neck: Normal range of motion. Neck supple. No JVD present. No tracheal deviation present. No thyromegaly present.  Cardiovascular: Normal rate, regular rhythm, normal heart sounds and intact distal pulses. Exam reveals no gallop and no friction rub.  No murmur heard. Pulmonary/Chest: Breath sounds normal. No respiratory distress. He has no wheezes. He has no rales.  Abdominal: Soft. Bowel sounds are normal. He exhibits no mass. There is no hepatosplenomegaly. There is no tenderness. There is no rebound, no guarding and no CVA tenderness.  Musculoskeletal: Normal range of motion. He exhibits no edema or tenderness.  Lymphadenopathy:    He has no cervical adenopathy.  Neurological: He is alert and oriented to person, place, and time. He has normal strength and normal reflexes. No cranial nerve deficit.  Skin: Skin is warm. No rash noted.  Nursing note and vitals reviewed.   BP 118/60   Pulse 80   Ht 5\' 10"  (1.778 m)   Wt 257 lb (116.6 kg)   BMI 36.88 kg/m   Assessment and Plan:  1. Annual physical exam No subjective/objective concerns noted during history and physical exam will check renal functional panel.Brett Boone is a 72 y.o. male who presents today for his Complete Annual Exam. He feels well. He reports exercising . He reports he is sleeping well.  -  Renal Function Panel  2. Tobacco dependence Patient has been advised of the health risks of smoking and counseled concerning cessation of tobacco products. I spent over 3 minutes for discussion and to answer questions.  3. Class 2 severe obesity due to excess calories with serious comorbidity and body mass index (BMI) of 36.0 to 36.9 in adult [facility]) Health risks of being over weight were discussed and patient was counseled on weight loss options and exercise.  4. Nocturia Patient has frequent nocturia.  Will check a PSA. - PSA  5. Mild hyperlipidemia Chronic.  Distant.  Check lipid panel treat accordingly. - Lipid panel  6. LVH (left ventricular hypertrophy) due to hypertensive disease, without heart failure Patient is noted to have left ventricular hypertrophy and is followed in conjunction with Dr. Nehemiah Massed.  Low-sodium diet encouraged as well as smoking cessation and exercise.  Check renal panel recurrent renal function. - Renal Function Panel    Dr. Otilio Miu [facility]  01/04/2018

## 2018-01-05 LAB — RENAL FUNCTION PANEL
Albumin: 4.2 g/dL (ref 3.5–4.8)
BUN/Creatinine Ratio: 12 (ref 10–24)
BUN: 13 mg/dL (ref 8–27)
CO2: 24 mmol/L (ref 20–29)
Calcium: 8.8 mg/dL (ref 8.6–10.2)
Chloride: 102 mmol/L (ref 96–106)
Creatinine, Ser: 1.07 mg/dL (ref 0.76–1.27)
GFR calc Af Amer: 80 mL/min/{1.73_m2} (ref >59)
GFR calc non Af Amer: 69 mL/min/{1.73_m2} (ref >59)
Glucose: 101 mg/dL — ABNORMAL HIGH (ref 65–99)
Phosphorus: 2.7 mg/dL (ref 2.5–4.5)
Potassium: 4.9 mmol/L (ref 3.5–5.2)
Sodium: 139 mmol/L (ref 134–144)

## 2018-01-05 LAB — LIPID PANEL
Chol/HDL Ratio: 1.4 ratio (ref 0.0–5.0)
Cholesterol, Total: 67 mg/dL — ABNORMAL LOW (ref 100–199)
HDL: 47 mg/dL (ref >39)
LDL Calculated: 13 mg/dL (ref 0–99)
Triglycerides: 36 mg/dL (ref 0–149)
VLDL Cholesterol Cal: 7 mg/dL (ref 5–40)

## 2018-01-05 LAB — PSA: Prostate Specific Ag, Serum: 3.1 ng/mL (ref 0.0–4.0)

## 2018-04-14 ENCOUNTER — Encounter: Payer: Self-pay | Admitting: *Deleted

## 2018-04-16 ENCOUNTER — Encounter: Payer: Self-pay | Admitting: *Deleted

## 2018-04-18 ENCOUNTER — Ambulatory Visit
Admission: EM | Admit: 2018-04-18 | Discharge: 2018-04-18 | Disposition: A | Discharge status: Home / Self Care | Payer: Medicare Other | Attending: Family Medicine | Admitting: Family Medicine

## 2018-04-18 ENCOUNTER — Other Ambulatory Visit: Payer: Self-pay

## 2018-04-18 DIAGNOSIS — S61412A Laceration without foreign body of left hand, initial encounter: Secondary | ICD-10-CM | POA: Diagnosis not present

## 2018-04-18 DIAGNOSIS — W228XXA Striking against or struck by other objects, initial encounter: Secondary | ICD-10-CM | POA: Diagnosis not present

## 2018-04-18 NOTE — ED Triage Notes (Signed)
Patient states that around 130am this morning he hit an open dresser drawer with his hand and cut his left hand.

## 2018-04-18 NOTE — Discharge Instructions (Signed)
Sutures out in 7 days.  Keep clean.  Take care  Dr. Mimi Debellis  

## 2018-04-18 NOTE — ED Provider Notes (Signed)
[facility]    CSN: [id number] Arrival date & time: 04/18/18  1035  History   Chief Complaint Chief Complaint  Patient presents with  . Laceration   HPI  73 year old male presents with left hand laceration.  Occurred at approximately 130 this morning.  He accidentally hit his left hand (dorsum) on a open dresser drawer.  Suffered a laceration. He states that he cleaned the wound with water.  No soap.  His wound was dressed by his wife with Steri-Strips.  He is unsure of his last tetanus.  Per the chart it was in 2016.  Bleeding is well controlled.  Pain is currently 1/10 in severity.  No other associated symptoms.  No other complaints.  PMH, Surgical Hx, Family Hx, Social History reviewed and updated as below.  Past Medical History:  Diagnosis Date  . Back injury   . Heart murmur   . Venous stasis dermatitis of both lower extremities     Patient Active Problem List   Diagnosis Date Noted  . History of adenomatous polyp of colon   . Cigarette nicotine dependence without complication 48/88/9169  . 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 LVH (left ventricular hypertrophy) due to hypertensive disease, without heart failure 09/18/2014  . Shortness of breath 09/03/2014  . Annual physical exam 08/30/2014  . Epigastric hernia 07/18/2014    Past Surgical History:  Procedure Laterality Date  . CATARACT EXTRACTION W/PHACO Right 03/09/2017   Procedure: CATARACT EXTRACTION PHACO AND INTRAOCULAR LENS PLACEMENT ([place]) RIGHT;  Surgeon: Eulogio Bear, MD;  Location: [place];  Service: Ophthalmology;  Laterality: Right;  . COLONOSCOPY  2013   normal- cleared for 5 yrs- Dr Beckey Downing  . COLONOSCOPY WITH PROPOFOL N/A 02/17/2017   Procedure: COLONOSCOPY WITH PROPOFOL;  Surgeon: Lin Landsman, MD;  Location: [place];  Service: Endoscopy;  Laterality: N/A;  . EPIGASTRIC HERNIA REPAIR N/A 07/06/2014   Procedure: HERNIA REPAIR EPIGASTRIC ADULT;  Surgeon:  Molly Maduro, MD;  Location: [facility];  Service: General;  Laterality: N/A;  . HERNIA REPAIR Bilateral   . INSERTION OF MESH N/A 07/06/2014   Procedure: INSERTION OF MESH;  Surgeon: Molly Maduro, MD;  Location: [facility];  Service: General;  Laterality: N/A;  . POLYPECTOMY  02/17/2017   Procedure: POLYPECTOMY;  Surgeon: Lin Landsman, MD;  Location: [place];  Service: Endoscopy;;  . SHOULDER ARTHROSCOPY Left 2008  . SINUS EXPLORATION         Home Medications    Prior to Admission medications   Not on File    Family History Family History  Problem Relation Age of Onset  . Diabetes Father   . Heart disease Father 40  . Stroke Mother     Social History Social History   Tobacco Use  . Smoking status: Current Every Day Smoker    Packs/day: 0.25    Years: 50.00    Pack years: 12.50    Types: Cigarettes  . Smokeless tobacco: Former Network engineer Use Topics  . Alcohol use: Yes    Comment: Occasional -1x/month  . Drug use: No     Allergies   Amoxicillin-pot clavulanate   Review of Systems Review of Systems  Constitutional: Negative.   Skin: Positive for wound.   Physical Exam Triage Vital Signs ED Triage Vitals  Enc Vitals Group     BP 04/18/18 1052 118/79     Pulse Rate 04/18/18 1052 63     Resp 04/18/18 1052 16  Temp 04/18/18 1052 98.4 F (36.9 C)     Temp Source 04/18/18 1052 Oral     SpO2 04/18/18 1052 100 %     Weight 04/18/18 1047 250 lb (113.4 kg)     Height 04/18/18 1047 5\' 11"  (1.803 m)     Head Circumference --      Peak Flow --      Pain Score 04/18/18 1047 1     Pain Loc --      Pain Edu? --      Excl. in [place]? --    Updated Vital Signs BP 118/79 (BP Location: Left Arm)   Pulse 63   Temp 98.4 F (36.9 C) (Oral)   Resp 16   Ht 5\' 11"  (1.803 m)   Wt 113.4 kg   SpO2 100%   BMI 34.87 kg/m   Visual Acuity Right Eye Distance:   Left Eye Distance:   Bilateral Distance:    Right Eye Near:   Left Eye Near:     Bilateral Near:     Physical Exam Constitutional:      General: He is not in acute distress.    Appearance: Normal appearance.  HENT:     Head: Normocephalic and atraumatic.  Eyes:     General:        Right eye: No discharge.        Left eye: No discharge.     Conjunctiva/sclera: Conjunctivae normal.  Cardiovascular:     Rate and Rhythm: Normal rate and regular rhythm.  Pulmonary:     Effort: Pulmonary effort is normal.     Breath sounds: Normal breath sounds.  Skin:    Comments: Laceration noted to the dorsum of the left hand.  4 cm.  Portion of the laceration is very superficial and appears to be like a skin tear with the top layers of skin removed.  See picture.  Neurological:     Mental Status: He is alert.  Psychiatric:        Mood and Affect: Mood normal.        Behavior: Behavior normal.        UC Treatments / Results  Labs (all labs ordered are listed, but only abnormal results are displayed) Labs Reviewed - No data to display  EKG None  Radiology No results found.  Procedures Laceration Repair Date/Time: 04/18/2018 11:28 AM Performed by: Coral Spikes, DO Authorized by: Coral Spikes, DO   Consent:    Consent obtained:  Verbal   Consent given by:  Patient Anesthesia (see MAR for exact dosages):    Anesthesia method:  Local infiltration   Local anesthetic:  Lidocaine 1% WITH epi Laceration details:    Location:  Hand   Hand location:  L hand, dorsum   Length (cm):  4 Repair type:    Repair type:  Simple Pre-procedure details:    Preparation:  Patient was prepped and draped in usual sterile fashion Exploration:    Hemostasis achieved with:  Epinephrine and direct pressure   Contaminated: no   Treatment:    Area cleansed with:  Soap and water   Amount of cleaning:  Standard Skin repair:    Repair method:  Sutures   Suture size:  4-0   Suture material:  Nylon   Suture technique:  Simple interrupted   Number of sutures:  5 Approximation:     Approximation:  Close Post-procedure details:    Dressing:  Antibiotic ointment and bulky dressing  Patient tolerance of procedure:  Tolerated well, no immediate complications   (including critical care time)  Medications Ordered in UC Medications - No data to display  Initial Impression / Assessment and Plan / UC Course  I have reviewed the triage vital signs and the nursing notes.  Pertinent labs & imaging results that were available during my care of the patient were reviewed by me and considered in my medical decision making (see chart for details).    73 year old male presents with a laceration.  Repaired as above.  Wound was dressed.  Sutures out in 7 days.  Tetanus up-to-date  Final Clinical Impressions(s) / UC Diagnoses   Final diagnoses:  Laceration of left hand without foreign body, initial encounter     Discharge Instructions     Sutures out in 7 days.  Keep clean.  Take care  Dr. Lacinda Axon    ED Prescriptions    None     Controlled Substance Prescriptions [place] Controlled Substance Registry consulted? N/A   Coral Spikes, DO 04/18/18 1132

## 2018-06-14 ENCOUNTER — Telehealth: Payer: Self-pay | Admitting: *Deleted

## 2018-06-14 NOTE — Telephone Encounter (Signed)
Left message for patient to notify them that it is time to schedule annual low dose lung cancer screening CT scan. Instructed patient to call back to verify information prior to the scan being scheduled.  

## 2018-07-15 ENCOUNTER — Telehealth: Payer: Self-pay | Admitting: *Deleted

## 2018-07-15 ENCOUNTER — Telehealth: Payer: Self-pay

## 2018-07-15 DIAGNOSIS — Z87891 Personal history of nicotine dependence: Secondary | ICD-10-CM

## 2018-07-15 DIAGNOSIS — Z122 Encounter for screening for malignant neoplasm of respiratory organs: Secondary | ICD-10-CM

## 2018-07-15 NOTE — Telephone Encounter (Signed)
Call pt regarding lung screening. Pt is a former smoker. Scan can be anytime. Pt denies any new health issues at this time.

## 2018-07-15 NOTE — Telephone Encounter (Signed)
Patient has been notified that annual lung cancer screening low dose CT scan is due currently or will be in near future. Confirmed that patient is within the age range of 55-77, and asymptomatic, (no signs or symptoms of lung cancer). Patient denies illness that would prevent curative treatment for lung cancer if found. Verified smoking history, (former, quit 2019, 30 pack year). The shared decision making visit was done 05/04/17. Patient is agreeable for CT scan being scheduled.

## 2018-07-20 ENCOUNTER — Ambulatory Visit
Admission: RE | Admit: 2018-07-20 | Discharge: 2018-07-20 | Disposition: A | Discharge status: Home / Self Care | Payer: Medicare Other | Source: Ambulatory Visit | Attending: Nurse Practitioner | Admitting: Nurse Practitioner

## 2018-07-20 ENCOUNTER — Other Ambulatory Visit: Payer: Self-pay

## 2018-07-20 DIAGNOSIS — Z87891 Personal history of nicotine dependence: Secondary | ICD-10-CM | POA: Diagnosis not present

## 2018-07-20 DIAGNOSIS — Z122 Encounter for screening for malignant neoplasm of respiratory organs: Secondary | ICD-10-CM | POA: Insufficient documentation

## 2018-07-20 IMAGING — CT CT CHEST LUNG CANCER SCREENING LOW DOSE
2 of 5 series · 15 of 40 positions shown, 18 images · non-contrast
Comparison: [DATE]

CLINICAL DATA: Former smoker. Asymptomatic. Thirty pack-year
history.

EXAM:
CT CHEST WITHOUT CONTRAST LOW-DOSE FOR LUNG CANCER SCREENING
TECHNIQUE: Multidetector CT imaging of the chest was performed following the
standard protocol without IV contrast.

[Series 3: lung · axial · 0.77mm/px · z∈[-1204,-880]mm · 12 of 361 slices shown, 15 images]
[im 19/361  mediastinal]
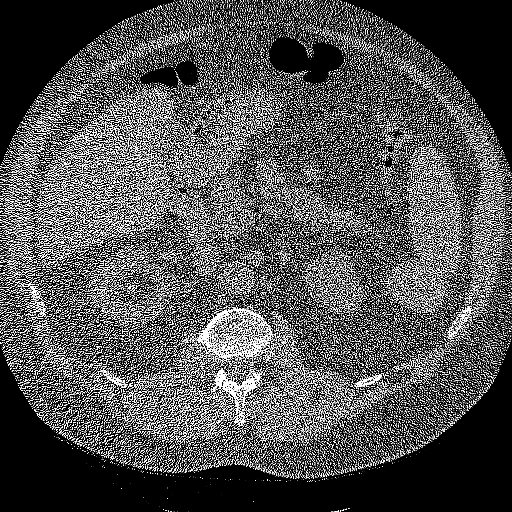
[im 19/361  lung]
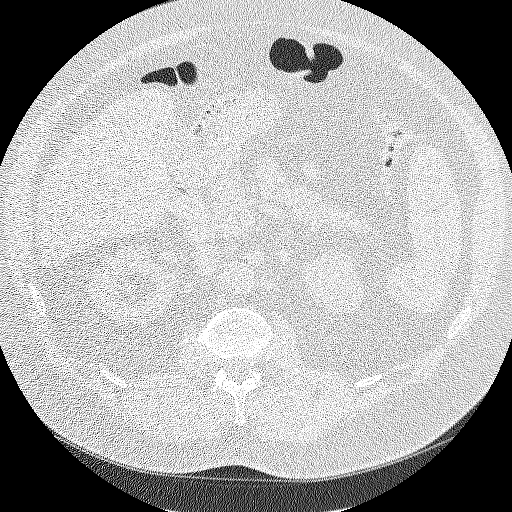
[im 55/361  lung]
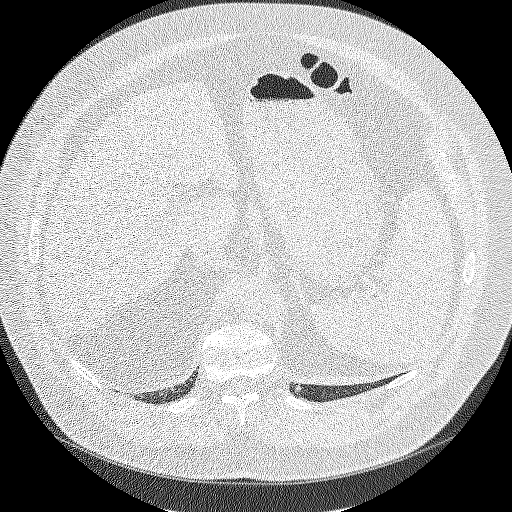
[im 73/361  lung]
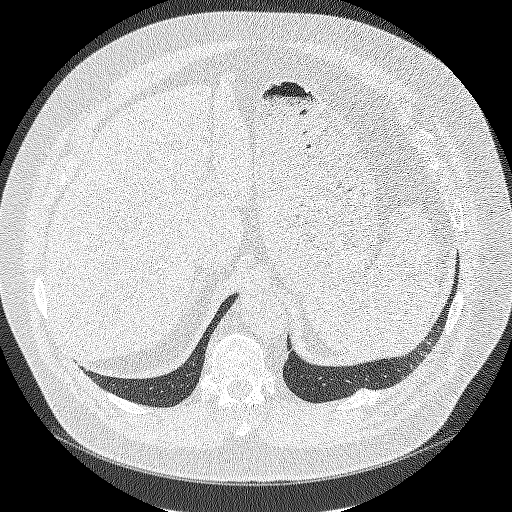
[im 109/361  lung]
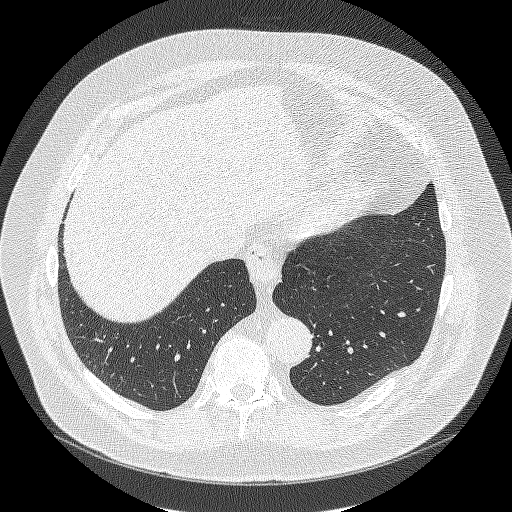
[im 145/361  mediastinal]
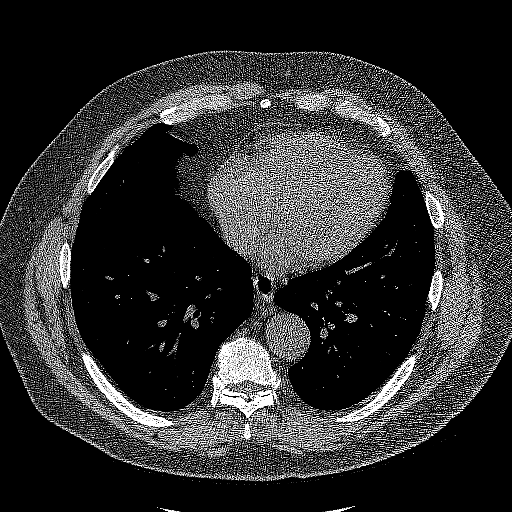
[im 145/361  lung]
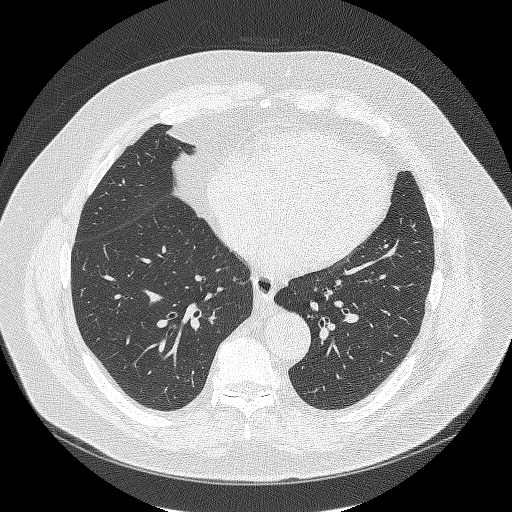
[im 163/361  lung]
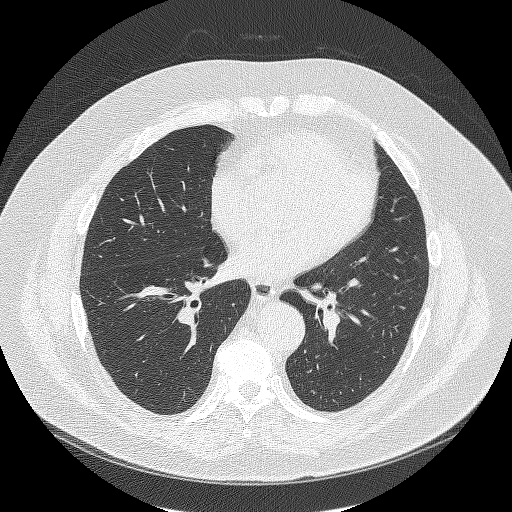
[im 199/361  lung]
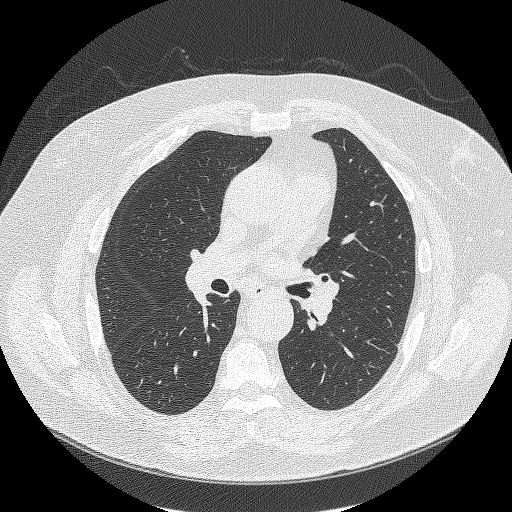
[im 217/361  lung]
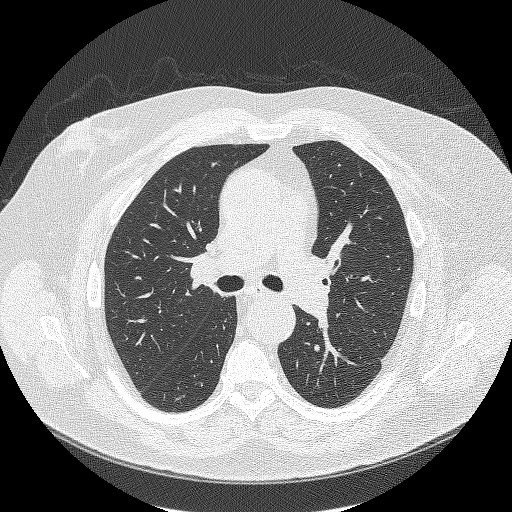
[im 253/361  mediastinal]
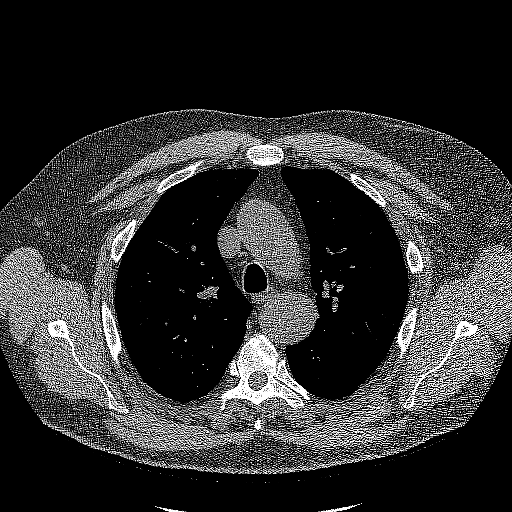
[im 253/361  lung]
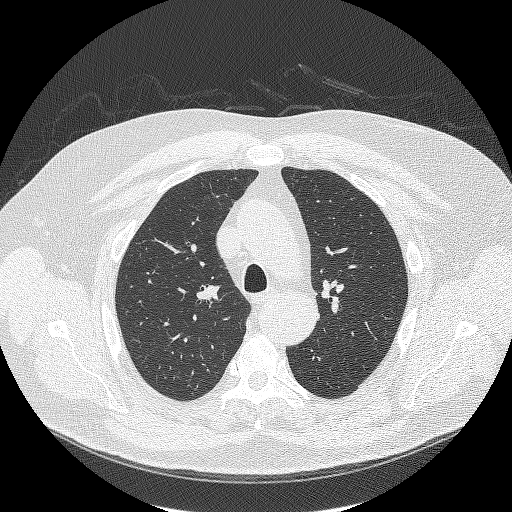
[im 289/361  lung]
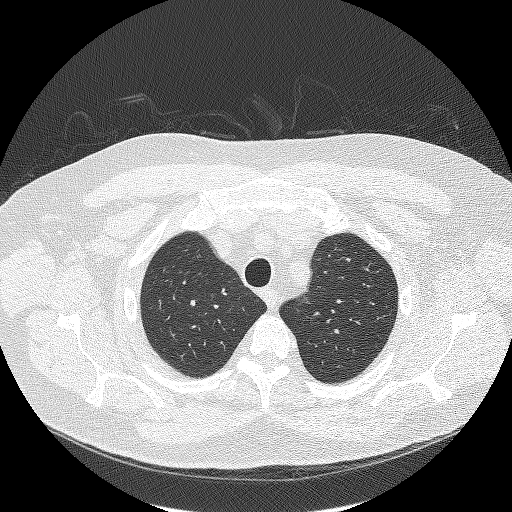
[im 307/361  lung]
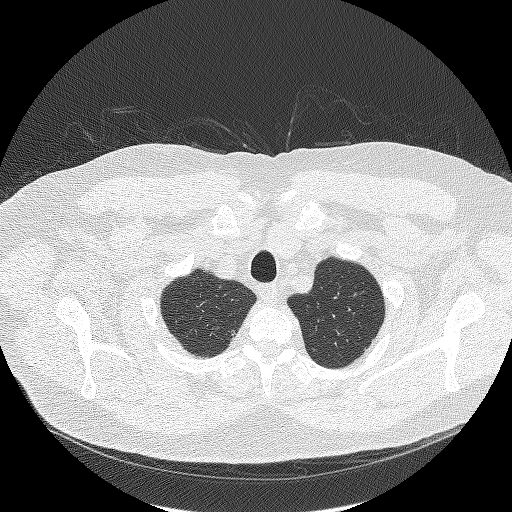
[im 343/361  lung]
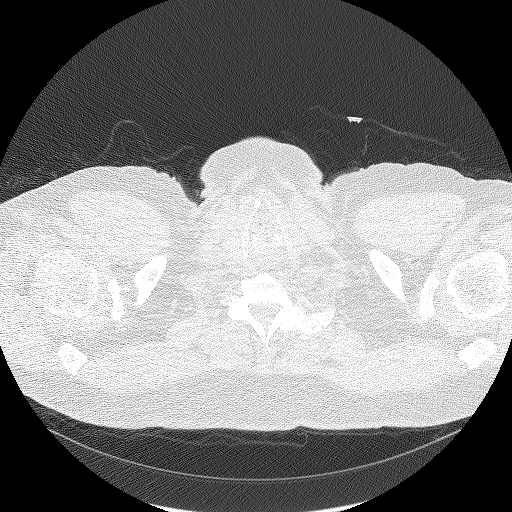

[Series 4: coronal lung · coronal · 0.71mm/px · 3 of 350 slices shown]
[im 70/350  lung]
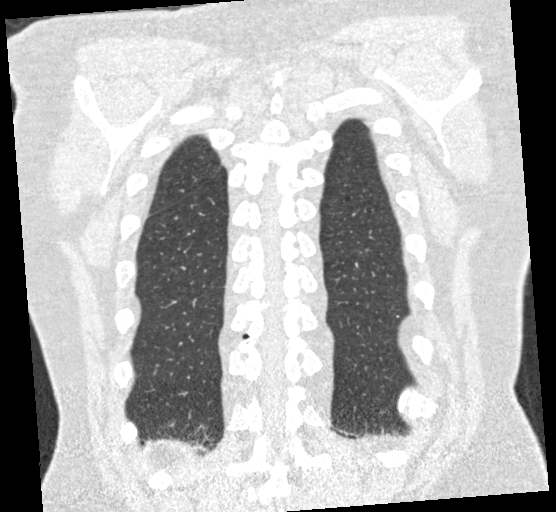
[im 140/350  lung]
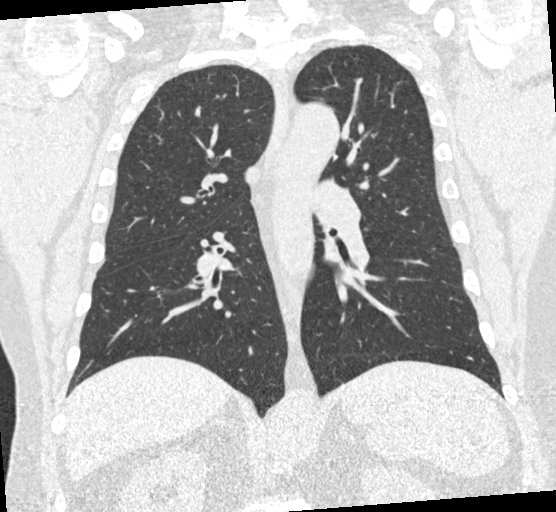
[im 210/350  lung]
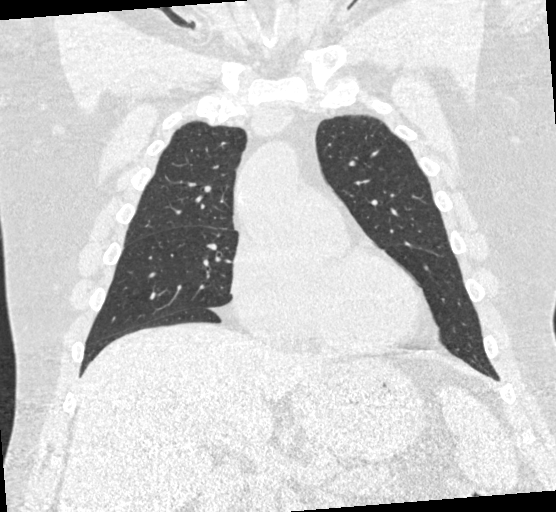

[15 of 40 positions shown; findings below may reference images not displayed]

FINDINGS: Cardiovascular: Normal heart size. No pericardial effusion. Aortic
atherosclerosis. The ascending thoracic aorta measures 4.5 cm, image
34/2. Unchanged.

Mediastinum/Nodes: Normal appearance of the thyroid gland. The
trachea appears patent and is midline. Small hiatal hernia. No
mediastinal or hilar adenopathy.

Lungs/Pleura: No pleural effusion. Mild changes of emphysema. No
airspace consolidation or atelectasis. Scattered small noncalcified
pulmonary nodules are noted throughout both lungs and appear
unchanged from previous exam. The largest is in the anterior right
lower lobe with an equivalent diameter 5.3 mm. No change from prior
exam.

Upper Abdomen: No acute abnormality.

Musculoskeletal: No chest wall mass or suspicious bone lesions
identified.
IMPRESSION: Lung-RADS 2, benign appearance or behavior. Continue annual
screening with low-dose chest CT without contrast in 12 months.

Aortic Atherosclerosis ([0T]-[0T]) and Emphysema ([0T]-[0T]).

Ascending thoracic aortic aneurysm. Recommend semi-annual imaging
followup by CTA or MRA and referral to cardiothoracic surgery if not
already obtained. This recommendation follows [0T]
ACCF/AHA/AATS/ACR/ASA/SCA/ASSALAMUALAIKUM/ASSALAMUALAIKUM/ASSALAMUALAIKUM/ASSALAMUALAIKUM Guidelines for the
Diagnosis and Management of Patients With Thoracic Aortic Disease.
Circulation. [0T]; 121: E266-e369. Aortic aneurysm NOS ([0T]-[0T])

## 2018-07-21 ENCOUNTER — Telehealth: Payer: Self-pay | Admitting: *Deleted

## 2018-07-21 ENCOUNTER — Encounter: Payer: Self-pay | Admitting: *Deleted

## 2018-07-21 NOTE — Telephone Encounter (Signed)
Notified patient of LDCT lung cancer screening program results with recommendation for 12 month follow up imaging. Also notified of incidental findings noted below and is encouraged to discuss further with PCP who will receive a copy of this note and/or the CT report. Especially discussed the aneurysm which is unchanged from last year's imaging.  Patient verbalizes understanding.   IMPRESSION: Lung-RADS 2, benign appearance or behavior. Continue annual screening with low-dose chest CT without contrast in 12 months.  Aortic Atherosclerosis (ICD10-I70.0) and Emphysema (ICD10-J43.9).  Ascending thoracic aortic aneurysm. Recommend semi-annual imaging followup by CTA or MRA and referral to cardiothoracic surgery if not already obtained. This recommendation follows 2010 ACCF/AHA/AATS/ACR/ASA/SCA/SCAI/SIR/STS/SVM Guidelines for the Diagnosis and Management of Patients With Thoracic Aortic Disease. Circulation. 2010; 121: D643-C381. Aortic aneurysm NOS (ICD10-I71.9)

## 2018-11-14 ENCOUNTER — Other Ambulatory Visit: Payer: Self-pay

## 2018-11-14 ENCOUNTER — Ambulatory Visit (INDEPENDENT_AMBULATORY_CARE_PROVIDER_SITE_OTHER): Payer: Medicare Other

## 2018-11-14 DIAGNOSIS — Z23 Encounter for immunization: Secondary | ICD-10-CM | POA: Diagnosis not present

## 2019-01-11 ENCOUNTER — Ambulatory Visit: Payer: Medicare Other

## 2019-02-06 ENCOUNTER — Ambulatory Visit (INDEPENDENT_AMBULATORY_CARE_PROVIDER_SITE_OTHER): Payer: Medicare Other

## 2019-02-06 DIAGNOSIS — Z0000 Encounter for general adult medical examination without abnormal findings: Secondary | ICD-10-CM
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
## 2019-02-06 NOTE — Patient Instructions (Signed)
[name] , Thank you for taking time to come for your Medicare Wellness Visit. I appreciate your ongoing commitment to your health goals. Please review the following plan we discussed and let me know if I can assist you in the future.   Screening recommendations/referrals: Colonoscopy: done 02/17/17. Repeat in 2022. Recommended yearly ophthalmology/optometry visit for glaucoma screening and checkup Recommended yearly dental visit for hygiene and checkup  Vaccinations: Influenza vaccine: done 11/14/18 Pneumococcal vaccine: done 12/14/16 Tdap vaccine: done 08/30/14 Shingles vaccine: Shingrix discussed. Please contact your pharmacy for coverage information.   Advanced directives: Advance directive discussed with you today. I have provided a copy for you to complete at home and have notarized. Once this is complete please bring a copy in to our office so we can scan it into your chart.  Conditions/risks identified: Recommend increasing physical activity and healthy eating   Next appointment: Please follow up in one year for your Medicare Annual Wellness visit.    Preventive Care 26 Years and Older, Male Preventive care refers to lifestyle choices and visits with your health care provider that can promote health and wellness. What does preventive care include?  A yearly physical exam. This is also called an annual well check.  Dental exams once or twice a year.  Routine eye exams. Ask your health care provider how often you should have your eyes checked.  Personal lifestyle choices, including:  Daily care of your teeth and gums.  Regular physical activity.  Eating a healthy diet.  Avoiding tobacco and drug use.  Limiting alcohol use.  Practicing safe sex.  Taking low doses of aspirin every day.  Taking vitamin and mineral supplements as recommended by your health care provider. What happens during an annual well check? The services and screenings done by your health care  provider during your annual well check will depend on your age, overall health, lifestyle risk factors, and family history of disease. Counseling  Your health care provider may ask you questions about your:  Alcohol use.  Tobacco use.  Drug use.  Emotional well-being.  Home and relationship well-being.  Sexual activity.  Eating habits.  History of falls.  Memory and ability to understand (cognition).  Work and work Statistician. Screening  You may have the following tests or measurements:  Height, weight, and BMI.  Blood pressure.  Lipid and cholesterol levels. These may be checked every 5 years, or more frequently if you are over 62 years old.  Skin check.  Lung cancer screening. You may have this screening every year starting at age 45 if you have a 30-pack-year history of smoking and currently smoke or have quit within the past 15 years.  Fecal occult blood test (FOBT) of the stool. You may have this test every year starting at age 19.  Flexible sigmoidoscopy or colonoscopy. You may have a sigmoidoscopy every 5 years or a colonoscopy every 10 years starting at age 34.  Prostate cancer screening. Recommendations will vary depending on your family history and other risks.  Hepatitis C blood test.  Hepatitis B blood test.  Sexually transmitted disease (STD) testing.  Diabetes screening. This is done by checking your blood sugar (glucose) after you have not eaten for a while (fasting). You may have this done every 1-3 years.  Abdominal aortic aneurysm (AAA) screening. You may need this if you are a current or former smoker.  Osteoporosis. You may be screened starting at age 52 if you are at high risk. Talk with your  health care provider about your test results, treatment options, and if necessary, the need for more tests. Vaccines  Your health care provider may recommend certain vaccines, such as:  Influenza vaccine. This is recommended every year.  Tetanus,  diphtheria, and acellular pertussis (Tdap, Td) vaccine. You may need a Td booster every 10 years.  Zoster vaccine. You may need this after age 58.  Pneumococcal 13-valent conjugate (PCV13) vaccine. One dose is recommended after age 55.  Pneumococcal polysaccharide (PPSV23) vaccine. One dose is recommended after age 17. Talk to your health care provider about which screenings and vaccines you need and how often you need them. This information is not intended to replace advice given to you by your health care provider. Make sure you discuss any questions you have with your health care provider. Document Released: 02/08/2015 Document Revised: 10/02/2015 Document Reviewed: 11/13/2014 Elsevier Interactive Patient Education  2017 [employer] Prevention in the Home Falls can cause injuries. They can happen to people of all ages. There are many things you can do to make your home safe and to help prevent falls. What can I do on the outside of my home?  Regularly fix the edges of walkways and driveways and fix any cracks.  Remove anything that might make you trip as you walk through a door, such as a raised step or threshold.  Trim any bushes or trees on the path to your home.  Use bright outdoor lighting.  Clear any walking paths of anything that might make someone trip, such as rocks or tools.  Regularly check to see if handrails are loose or broken. Make sure that both sides of any steps have handrails.  Any raised decks and porches should have guardrails on the edges.  Have any leaves, snow, or ice cleared regularly.  Use sand or salt on walking paths during winter.  Clean up any spills in your garage right away. This includes oil or grease spills. What can I do in the bathroom?  Use night lights.  Install grab bars by the toilet and in the tub and shower. Do not use towel bars as grab bars.  Use non-skid mats or decals in the tub or shower.  If you need to sit down in  the shower, use a plastic, non-slip stool.  Keep the floor dry. Clean up any water that spills on the floor as soon as it happens.  Remove soap buildup in the tub or shower regularly.  Attach bath mats securely with double-sided non-slip rug tape.  Do not have throw rugs and other things on the floor that can make you trip. What can I do in the bedroom?  Use night lights.  Make sure that you have a light by your bed that is easy to reach.  Do not use any sheets or blankets that are too big for your bed. They should not hang down onto the floor.  Have a firm chair that has side arms. You can use this for support while you get dressed.  Do not have throw rugs and other things on the floor that can make you trip. What can I do in the kitchen?  Clean up any spills right away.  Avoid walking on wet floors.  Keep items that you use a lot in easy-to-reach places.  If you need to reach something above you, use a strong step stool that has a grab bar.  Keep electrical cords out of the way.  Do not use  floor polish or wax that makes floors slippery. If you must use wax, use non-skid floor wax.  Do not have throw rugs and other things on the floor that can make you trip. What can I do with my stairs?  Do not leave any items on the stairs.  Make sure that there are handrails on both sides of the stairs and use them. Fix handrails that are broken or loose. Make sure that handrails are as long as the stairways.  Check any carpeting to make sure that it is firmly attached to the stairs. Fix any carpet that is loose or worn.  Avoid having throw rugs at the top or bottom of the stairs. If you do have throw rugs, attach them to the floor with carpet tape.  Make sure that you have a light switch at the top of the stairs and the bottom of the stairs. If you do not have them, ask someone to add them for you. What else can I do to help prevent falls?  Wear shoes that:  Do not have high  heels.  Have rubber bottoms.  Are comfortable and fit you well.  Are closed at the toe. Do not wear sandals.  If you use a stepladder:  Make sure that it is fully opened. Do not climb a closed stepladder.  Make sure that both sides of the stepladder are locked into place.  Ask someone to hold it for you, if possible.  Clearly mark and make sure that you can see:  Any grab bars or handrails.  First and last steps.  Where the edge of each step is.  Use tools that help you move around (mobility aids) if they are needed. These include:  Canes.  Walkers.  Scooters.  Crutches.  Turn on the lights when you go into a dark area. Replace any light bulbs as soon as they burn out.  Set up your furniture so you have a clear path. Avoid moving your furniture around.  If any of your floors are uneven, fix them.  If there are any pets around you, be aware of where they are.  Review your medicines with your doctor. Some medicines can make you feel dizzy. This can increase your chance of falling. Ask your doctor what other things that you can do to help prevent falls. This information is not intended to replace advice given to you by your health care provider. Make sure you discuss any questions you have with your health care provider. Document Released: 11/08/2008 Document Revised: 06/20/2015 Document Reviewed: 02/16/2014 Elsevier Interactive Patient Education  2017 [employer].

## 2019-06-05 ENCOUNTER — Telehealth: Payer: Self-pay | Admitting: Family Medicine

## 2019-06-05 NOTE — Telephone Encounter (Unsigned)
Copied from [place] [phone]. Topic: Referral - Request for Referral >> Jun 05, 2019  1:24 PM Scherrie Gerlach wrote: Has patient seen PCP for this complaint? no *If NO, is insurance requiring patient see PCP for this issue before PCP can refer them? Pt declined appt at this time. Referral for which specialty: Urologist Preferred provider/office:  [facility] Reason for referral:  Pt states he is having a severe erection issue and wants to see a urologist asap- any one is fine with him. Pt states first available

## 2019-06-05 NOTE — Telephone Encounter (Signed)
Have not seen since Dec 2019- he needs to sched appt to be seen for the referral

## 2019-06-07 ENCOUNTER — Other Ambulatory Visit: Payer: Self-pay

## 2019-06-07 ENCOUNTER — Telehealth: Payer: Self-pay | Admitting: Family Medicine

## 2019-06-07 ENCOUNTER — Ambulatory Visit (INDEPENDENT_AMBULATORY_CARE_PROVIDER_SITE_OTHER): Payer: Medicare Other | Admitting: Family Medicine

## 2019-06-07 ENCOUNTER — Encounter: Payer: Self-pay | Admitting: Family Medicine

## 2019-06-07 VITALS — BP 134/66 | HR 60 | Ht 71.0 in | Wt 269.0 lb

## 2019-06-07 DIAGNOSIS — N401 Enlarged prostate with lower urinary tract symptoms: Secondary | ICD-10-CM

## 2019-06-07 DIAGNOSIS — N433 Hydrocele, unspecified: Secondary | ICD-10-CM | POA: Diagnosis not present

## 2019-06-07 DIAGNOSIS — N5201 Erectile dysfunction due to arterial insufficiency: Secondary | ICD-10-CM | POA: Diagnosis not present

## 2019-06-07 DIAGNOSIS — R351 Nocturia: Secondary | ICD-10-CM | POA: Diagnosis not present

## 2019-06-07 NOTE — Telephone Encounter (Unsigned)
Copied from [place] [id number]. Topic: General - Inquiry >> Jun 07, 2019 11:08 AM Brett Boone wrote: Reason for CRM: Patient called stating that he needed to let [employer] Flattery know the dates if his covid vaccine.  Covid 1st dose 2/10 2nd dose 3/3

## 2019-06-07 NOTE — Patient Instructions (Addendum)
Hydrocelectomy, Adult  A hydrocelectomy is a surgical procedure to remove a collection of fluid (hydrocele) from the scrotum, which is the pouch that holds the testicles. You may need to have this procedure if a hydrocele is causing painful swelling in your scrotum. Tell a health care provider about:  Any allergies you have.  All medicines you are taking, including vitamins, herbs, eye drops, creams, and over-the-counter medicines.  Any problems you or family members have had with anesthetic medicines.  Any blood disorders you have.  Any surgeries you have had.  Any medical conditions you have. What are the risks? Generally, this is a safe procedure. However, problems may occur, including:  Bleeding into the scrotum (scrotal hematoma).  Damage to nearby structures or organs, including to the testicle or the tube that carries sperm out of the testicle (vas deferens).  Infection.  Allergic reactions to medicines. What happens before the procedure? Staying hydrated Follow instructions from your health care provider about hydration, which may include:  Up to 2 hours before the procedure - you may continue to drink clear liquids, such as water, clear fruit juice, black coffee, and plain tea. Eating and drinking restrictions Follow instructions from your health care provider about eating and drinking, which may include:  8 hours before the procedure - stop eating heavy meals or foods, such as meat, fried foods, or fatty foods.  6 hours before the procedure - stop eating light meals or foods, such as toast or cereal.  6 hours before the procedure - stop drinking milk or drinks that contain milk.  2 hours before the procedure - stop drinking clear liquids. Medicines Ask your health care provider about:  Changing or stopping your regular medicines. This is especially important if you are taking diabetes medicines or blood thinners.  Taking medicines such as aspirin and ibuprofen.  These medicines can thin your blood. Do not take these medicines unless your health care provider tells you to take them.  Taking over-the-counter medicines, vitamins, herbs, and supplements. General instructions  Do not use any products that contain nicotine or tobacco for at least 4 weeks before the procedure. These products include cigarettes, e-cigarettes, and chewing tobacco. If you need help quitting, ask your health care provider.  Plan to have someone take you home from the hospital or clinic.  Plan to have a responsible adult care for you for at least 24 hours after you leave the hospital or clinic. This is important.  Ask your health care provider: ? How your surgery site will be marked. ? What steps will be taken to help prevent infection. These may include:  Removing hair at the surgery site.  Washing skin with a germ-killing soap.  Taking antibiotic medicine. What happens during the procedure?  An IV will be inserted into one of your veins.  You will be given one or more of the following: ? A medicine to make you relax (sedative). ? A medicine to make you fall asleep (general anesthetic).  A small incision will be made through the skin of your scrotum.  Your testicle and the hydrocele will be located, and the hydrocele sac will be opened with an incision.  The fluid will be drained from the hydrocele. Part of the hydrocele sac may be removed.  The hydrocele will be closed with stitches that dissolve (absorbable sutures). This prevents fluid from building up again.  If your hydrocele is large, you may have a thin, rubber drain placed to allow fluid to drain  after the procedure.  The incision in your scrotum will be closed with absorbable sutures, skin glue, or adhesives.  A bandage (dressing) will be placed over the incision. The dressing may be held in place with an athletic support strap (scrotal support). The procedure may vary among health care providers and  hospitals. What happens after the procedure?   Your blood pressure, heart rate, breathing rate, and blood oxygen level will be monitored until you leave the hospital or clinic.  You will be given pain medicine as needed.  Your IV will be removed, and your insertion site will be checked for bleeding.  Do not drive for 24 hours if you were given a sedative during your procedure.  You may need to wear a scrotal support. This holds the dressing in place and supports your scrotum. Summary  A hydrocelectomy is a surgical procedure to remove a collection of fluid (hydrocele) from the scrotum, which is the pouch that holds the testicles. You may need to have this procedure if a hydrocele is causing painful swelling in your scrotum.  During the procedure, the hydrocele will be drained and then closed with stitches that dissolve (absorbable sutures). This prevents fluid from building up again.  If your hydrocele is large, you may have a thin, rubber drain placed to allow fluid to drain after the procedure.  You may need to wear a scrotal support after your procedure. This holds the dressing in place and supports your scrotum. This information is not intended to replace advice given to you by your health care provider. Make sure you discuss any questions you have with your health care provider. Document Revised: 06/07/2018 Document Reviewed: 06/07/2018 Elsevier Patient Education  2020 [employer]   Hydrocele, Adult A hydrocele is a collection of fluid in the loose pouch of skin that holds the testicles (scrotum). This may happen because:  The amount of fluid produced in the scrotum is not absorbed by the rest of the body.  Fluid from the abdomen fills the scrotum. Normally, the testicles develop in the abdomen then move (drop) into to the scrotum before birth. The tube that the testicles travel through usually closes after the testicles drop. If the tube does not close, fluid from the abdomen  can fill the scrotum. This is less common in adults. What are the causes? The cause of a hydrocele in adults is usually not known. However, it may be caused by:  An injury to the scrotum.  An infection (epididymitis).  Decreased blood flow to the scrotum.  Twisting of a testicle (testicular torsion).  A birth defect.  A tumor or cancer of the testicle. What are the signs or symptoms? A hydrocele feels like a water-filled balloon. It may also feel heavy. Other symptoms include:  Swelling of the scrotum. The swelling may decrease when you lie down. You may also notice more swelling at night than in the morning.  Swelling of the groin.  Mild discomfort in the scrotum.  Pain. This can develop if the hydrocele was caused by infection or twisting. The larger the hydrocele, the more likely you are to have pain. How is this diagnosed? This condition may be diagnosed based on:  Physical exam.  Medical history. You may also have other tests, including:  Imaging tests, such as ultrasound.  Blood or urine tests. How is this treated? Most hydroceles go away on their own. If you have no discomfort or pain, your health care provider may suggest close monitoring of your   watch and wait or watchful waiting) until the condition goes away or symptoms develop. If treatment is needed, it may include:  Treating an underlying condition. This may include using an antibiotic medicine to treat an infection.  Surgery to stop fluid from collecting in the scrotum.  Surgery to drain the fluid. Options include: ? Needle aspiration. A needle is used to drain fluid. However, the fluid buildup will come back quickly. ? Hydrocelectomy. For this procedure, an incision is made in the scrotum to remove the fluid sac. Follow these instructions at home:  Watch the hydrocele for any changes.  Take over-the-counter and prescription medicines only as told by your health care provider.  If you  were prescribed an antibiotic medicine, use it as told by your health care provider. Do not stop taking the antibiotic even if you start to feel better.  Keep all follow-up visits as told by your health care provider. This is important. Contact a health care provider if:  You notice any changes in the hydrocele.  The swelling in your scrotum or groin gets worse.  The hydrocele becomes red, firm, painful, or tender to the touch.  You have a fever. Get help right away if you:  Develop a lot of pain, or your pain becomes worse. Summary  A hydrocele is a collection of fluid in the loose pouch of skin that holds the testicles (scrotum).  Hydroceles can cause swelling, discomfort, and sometimes pain.  In adults, the cause of a hydrocele usually is not known. However, it is sometimes caused by an infection or a rotation and twisting of the scrotum.  Treatment is usually not needed. Hydroceles often go away on their own. If a hydrocele causes pain, treatment may be given to ease the pain. This information is not intended to replace advice given to you by your health care provider. Make sure you discuss any questions you have with your health care provider. Document Revised: 01/23/2017 Document Reviewed: 01/23/2017 Elsevier Patient Education  2020 [employer].  Benign Prostatic Hyperplasia  Benign prostatic hyperplasia (BPH) is an enlarged prostate gland that is caused by the normal aging process and not by cancer. The prostate is a walnut-sized gland that is involved in the production of semen. It is located in front of the rectum and below the bladder. The bladder stores urine and the urethra is the tube that carries the urine out of the body. The prostate may get bigger as a man gets older. An enlarged prostate can press on the urethra. This can make it harder to pass urine. The build-up of urine in the bladder can cause infection. Back pressure and infection may progress to bladder damage  and kidney (renal) failure. What are the causes? This condition is part of a normal aging process. However, not all men develop problems from this condition. If the prostate enlarges away from the urethra, urine flow will not be blocked. If it enlarges toward the urethra and compresses it, there will be problems passing urine. What increases the risk? This condition is more likely to develop in men over the age of 7 years. What are the signs or symptoms? Symptoms of this condition include: Getting up often during the night to urinate. Needing to urinate frequently during the day. Difficulty starting urine flow. Decrease in size and strength of your urine stream. Leaking (dribbling) after urinating. Inability to pass urine. This needs immediate treatment. Inability to completely empty your bladder. Pain when you pass urine. This is more  common if there is also an infection. Urinary tract infection (UTI). How is this diagnosed? This condition is diagnosed based on your medical history, a physical exam, and your symptoms. Tests will also be done, such as: A post-void bladder scan. This measures any amount of urine that may remain in your bladder after you finish urinating. A digital rectal exam. In a rectal exam, your health care provider checks your prostate by putting a lubricated, gloved finger into your rectum to feel the back of your prostate gland. This exam detects the size of your gland and any abnormal lumps or growths. An exam of your urine (urinalysis). A prostate specific antigen (PSA) screening. This is a blood test used to screen for prostate cancer. An ultrasound. This test uses sound waves to electronically produce a picture of your prostate gland. Your health care provider may refer you to a specialist in kidney and prostate diseases (urologist). How is this treated? Once symptoms begin, your health care provider will monitor your condition (active surveillance or watchful  waiting). Treatment for this condition will depend on the severity of your condition. Treatment may include: Observation and yearly exams. This may be the only treatment needed if your condition and symptoms are mild. Medicines to relieve your symptoms, including: Medicines to shrink the prostate. Medicines to relax the muscle of the prostate. Surgery in severe cases. Surgery may include: Prostatectomy. In this procedure, the prostate tissue is removed completely through an open incision or with a laparoscope or robotics. Transurethral resection of the prostate (TURP). In this procedure, a tool is inserted through the opening at the tip of the penis (urethra). It is used to cut away tissue of the inner core of the prostate. The pieces are removed through the same opening of the penis. This removes the blockage. Transurethral incision (TUIP). In this procedure, small cuts are made in the prostate. This lessens the prostate's pressure on the urethra. Transurethral microwave thermotherapy (TUMT). This procedure uses microwaves to create heat. The heat destroys and removes a small amount of prostate tissue. Transurethral needle ablation (TUNA). This procedure uses radio frequencies to destroy and remove a small amount of prostate tissue. Interstitial laser coagulation ([place]). This procedure uses a laser to destroy and remove a small amount of prostate tissue. Transurethral electrovaporization (TUVP). This procedure uses electrodes to destroy and remove a small amount of prostate tissue. Prostatic urethral lift. This procedure inserts an implant to push the lobes of the prostate away from the urethra. Follow these instructions at home: Take over-the-counter and prescription medicines only as told by your health care provider. Monitor your symptoms for any changes. Contact your health care provider with any changes. Avoid drinking large amounts of liquid before going to bed or out in public. Avoid or  reduce how much caffeine or alcohol you drink. Give yourself time when you urinate. Keep all follow-up visits as told by your health care provider. This is important. Contact a health care provider if: You have unexplained back pain. Your symptoms do not get better with treatment. You develop side effects from the medicine you are taking. Your urine becomes very dark or has a bad smell. Your lower abdomen becomes distended and you have trouble passing your urine. Get help right away if: You have a fever or chills. You suddenly cannot urinate. You feel lightheaded, or very dizzy, or you faint. There are large amounts of blood or clots in the urine. Your urinary problems become hard to manage. You develop moderate  to severe low back or flank pain. The flank is the side of your body between the ribs and the hip. These symptoms may represent a serious problem that is an emergency. Do not wait to see if the symptoms will go away. Get medical help right away. Call your local emergency services (911 in the [place] Do not drive yourself to the hospital. Summary Benign prostatic hyperplasia (BPH) is an enlarged prostate that is caused by the normal aging process and not by cancer. An enlarged prostate can press on the urethra. This can make it hard to pass urine. This condition is part of a normal aging process and is more likely to develop in men over the age of 43 years. Get help right away if you suddenly cannot urinate. This information is not intended to replace advice given to you by your health care provider. Make sure you discuss any questions you have with your health care provider. Document Revised: 12/07/2017 Document Reviewed: 02/17/2016 Elsevier Patient Education  2020 [employer].

## 2019-06-07 NOTE — Progress Notes (Signed)
Date:  06/07/2019   Name:  Brett Boone   DOB:  1945/06/11   MRN:  [id number]   Chief Complaint: Erectile Dysfunction (started after hydrocele 3 years ago- was taking sildenafil after diagnosis, but was giving him a "throbbing headache and palpitations". Stopped med. Hydrocele has gotten bigger within the past month and "gets in the way" of sexual activity. combination of both ED and hydrocele- can't have sex with wife anymore. Wants referral to urology for both issues)  Erectile Dysfunction This is a chronic problem. The current episode started more than 1 year ago. The problem has been gradually worsening since onset. The nature of his difficulty is achieving erection and maintaining erection. He reports no anxiety or decreased libido. Irritative symptoms include frequency. Irritative symptoms do not include nocturia or urgency. Obstructive symptoms include an intermittent stream, a slower stream and a weak stream. Obstructive symptoms do not include dribbling or incomplete emptying. Associated symptoms include an inability to urinate. Pertinent negatives include no chills, dysuria, hematuria or hesitancy. Past treatments include sildenafil. The treatment provided moderate (unable to take 80 mg due to headache/palpatations) relief. He has had no adverse reactions caused by medications. Risk factors: hydrocoel.  Male GU Problem The patient's primary symptoms include scrotal swelling. The patient's pertinent negatives include no genital injury, genital itching, genital lesions, pelvic pain, penile discharge, penile pain or testicular pain. The current episode started more than 1 month ago. Associated symptoms include frequency. Pertinent negatives include no abdominal pain, chest pain, chills, constipation, coughing, diarrhea, dysuria, fever, flank pain, headaches, hesitancy, nausea, rash, shortness of breath, sore throat or urgency. His past medical history is significant for erectile  dysfunction.    Lab Results  Component Value Date   CREATININE 1.07 01/04/2018   BUN 13 01/04/2018   NA 139 01/04/2018   K 4.9 01/04/2018   CL 102 01/04/2018   CO2 24 01/04/2018   Lab Results  Component Value Date   CHOL 67 (L) 01/04/2018   HDL 47 01/04/2018   LDLCALC 13 01/04/2018   TRIG 36 01/04/2018   CHOLHDL 1.4 01/04/2018   No results found for: TSH Lab Results  Component Value Date   HGBA1C 5.8 (H) 08/30/2014   No results found for: WBC, HGB, HCT, MCV, PLT No results found for: ALT, AST, GGT, ALKPHOS, BILITOT   Review of Systems  Constitutional: Negative for chills and fever.  HENT: Negative for drooling, ear discharge, ear pain and sore throat.   Respiratory: Negative for cough, shortness of breath and wheezing.   Cardiovascular: Negative for chest pain, palpitations and leg swelling.  Gastrointestinal: Negative for abdominal pain, blood in stool, constipation, diarrhea and nausea.  Endocrine: Negative for polydipsia.  Genitourinary: Positive for difficulty urinating, frequency and scrotal swelling. Negative for decreased libido, discharge, dysuria, enuresis, flank pain, hematuria, hesitancy, incomplete emptying, nocturia, pelvic pain, penile pain, testicular pain and urgency.  Musculoskeletal: Negative for back pain, myalgias and neck pain.  Skin: Negative for rash.  Allergic/Immunologic: Negative for environmental allergies.  Neurological: Negative for dizziness and headaches.  Hematological: Does not bruise/bleed easily.  Psychiatric/Behavioral: Negative for suicidal ideas. The patient is not nervous/anxious.     Patient Active Problem List   Diagnosis Date Noted  . History of adenomatous polyp of colon   . 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 Shortness of breath 09/03/2014  . Annual physical exam 08/30/2014  . 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 Amoxicillin-Pot Clavulanate Hives  and Nausea And Vomiting    Past Surgical  History:  Procedure Laterality Date  . CATARACT EXTRACTION W/PHACO Right 03/09/2017   Procedure: CATARACT EXTRACTION PHACO AND INTRAOCULAR LENS PLACEMENT ([place]) RIGHT;  Surgeon: Eulogio Bear, MD;  Location: [place];  Service: Ophthalmology;  Laterality: Right;  . COLONOSCOPY  2013   normal- cleared for 5 yrs- Dr Beckey Downing  . COLONOSCOPY WITH PROPOFOL N/A 02/17/2017   Procedure: COLONOSCOPY WITH PROPOFOL;  Surgeon: Lin Landsman, MD;  Location: [place];  Service: Endoscopy;  Laterality: N/A;  . EPIGASTRIC HERNIA REPAIR N/A 07/06/2014   Procedure: HERNIA REPAIR EPIGASTRIC ADULT;  Surgeon: Molly Maduro, MD;  Location: [facility];  Service: General;  Laterality: N/A;  . HERNIA REPAIR Bilateral   . INSERTION OF MESH N/A 07/06/2014   Procedure: INSERTION OF MESH;  Surgeon: Molly Maduro, MD;  Location: [facility];  Service: General;  Laterality: N/A;  . POLYPECTOMY  02/17/2017   Procedure: POLYPECTOMY;  Surgeon: Lin Landsman, MD;  Location: [place];  Service: Endoscopy;;  . SHOULDER ARTHROSCOPY Left 2008  . SINUS EXPLORATION      Social History   Tobacco Use  . Smoking status: Former Smoker    Packs/day: 0.25    Years: 50.00    Pack years: 12.50    Types: Cigarettes    Quit date: 05/03/2018    Years since quitting: 1.0  . Smokeless tobacco: Former Systems developer  . Tobacco comment: pt quit smoking  Substance Use Topics  . Alcohol use: Yes    Comment: Occasional -1x/month  . Drug use: No     Medication list has been reviewed and updated.  Current Meds  Medication Sig  . [DISCONTINUED] Ascorbic Acid (VITAMIN C) 1000 MG tablet Take 1,000 mg by mouth daily.    PHQ 2/9 Scores 06/07/2019 02/06/2019 12/15/2017 12/14/2016  PHQ - 2 Score 0 0 0 0  PHQ- 9 Score 0 - - 0    BP Readings from Last 3 Encounters:  06/07/19 134/66  04/18/18 118/79  01/04/18 118/60    Physical Exam Vitals and nursing note reviewed.  Constitutional:       Appearance: He is obese.  HENT:     Head: Normocephalic.     Right Ear: Tympanic membrane, ear canal and external ear normal.     Left Ear: Tympanic membrane, ear canal and external ear normal.     Nose: Nose normal.  Eyes:     General: No scleral icterus.       Right eye: No discharge.        Left eye: No discharge.     Conjunctiva/sclera: Conjunctivae normal.     Pupils: Pupils are equal, round, and reactive to light.  Neck:     Thyroid: No thyromegaly.     Vascular: No carotid bruit or JVD.     Trachea: No tracheal deviation.  Cardiovascular:     Rate and Rhythm: Normal rate and regular rhythm.     Heart sounds: Normal heart sounds. No murmur. No friction rub. No gallop.   Pulmonary:     Effort: No respiratory distress.     Breath sounds: Normal breath sounds. No wheezing, rhonchi or rales.  Abdominal:     General: Bowel sounds are normal.     Palpations: Abdomen is soft. There is no mass.     Tenderness: There is no abdominal tenderness. There is no guarding or rebound.  Genitourinary:    Prostate: Normal.  Rectum: Normal. Guaiac result negative.  Musculoskeletal:        General: No tenderness. Normal range of motion.     Cervical back: Normal range of motion and neck supple.  Lymphadenopathy:     Cervical: No cervical adenopathy.  Skin:    General: Skin is warm.     Findings: No rash.  Neurological:     Mental Status: He is alert and oriented to person, place, and time.     Cranial Nerves: No cranial nerve deficit.     Deep Tendon Reflexes: Reflexes are normal and symmetric.     Wt Readings from Last 3 Encounters:  06/07/19 269 lb (122 kg)  07/20/18 250 lb (113.4 kg)  04/18/18 250 lb (113.4 kg)    BP 134/66   Pulse 60   Ht 5\' 11"  (1.803 m)   Wt 269 lb (122 kg)   BMI 37.52 kg/m   Assessment and Plan:  1. 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  Persistent.  Gradually worsening.  There is difficulty taking sildenafil at 80 mg due  to headache and palpitations of the heart.  Patient would like to know what his options are including the situations below.  Referral to urology in [facility] location place per patient's preference. - Ambulatory referral to Urology  2. Hydrocele, unspecified hydrocele type Chronic.  Persistent.  Gradually increasing in size.  Patient feels that this is become more of a hindrance to erectile dysfunction and performance.  Patient was told that removal by aspiration is likely to have reaccumulation.  Patient given information on hydrocelectomy.  Referral has been made to urology for evaluation. - Ambulatory referral to Urology  3. Benign prostatic hyperplasia with nocturia New problem.  Patient is having more issues with hesitancy, decreased stream, frequency, and nocturia upon review of previous PSAs they are upper limits of normal which most recently has been 3.4-3.1.  Referral to urology for evaluation and treatment. - Ambulatory referral to Urology

## 2019-06-20 ENCOUNTER — Ambulatory Visit: Payer: Medicare Other | Admitting: Urology

## 2019-06-23 ENCOUNTER — Other Ambulatory Visit: Payer: Self-pay | Admitting: *Deleted

## 2019-06-23 DIAGNOSIS — R351 Nocturia: Secondary | ICD-10-CM

## 2019-06-23 DIAGNOSIS — N401 Enlarged prostate with lower urinary tract symptoms: Secondary | ICD-10-CM

## 2019-06-27 ENCOUNTER — Ambulatory Visit: Payer: Medicare Other | Admitting: Urology

## 2019-06-27 ENCOUNTER — Other Ambulatory Visit
Admission: RE | Admit: 2019-06-27 | Discharge: 2019-06-27 | Disposition: A | Discharge status: Home / Self Care | Payer: Medicare Other | Attending: Urology | Admitting: Urology

## 2019-06-27 ENCOUNTER — Telehealth: Payer: Self-pay

## 2019-06-27 ENCOUNTER — Other Ambulatory Visit: Payer: Self-pay

## 2019-06-27 ENCOUNTER — Encounter: Payer: Self-pay | Admitting: Urology

## 2019-06-27 VITALS — BP 145/83 | HR 65 | Ht 70.0 in | Wt 259.0 lb

## 2019-06-27 DIAGNOSIS — N401 Enlarged prostate with lower urinary tract symptoms: Secondary | ICD-10-CM | POA: Diagnosis not present

## 2019-06-27 DIAGNOSIS — R351 Nocturia: Secondary | ICD-10-CM

## 2019-06-27 DIAGNOSIS — N529 Male erectile dysfunction, unspecified: Secondary | ICD-10-CM | POA: Diagnosis not present

## 2019-06-27 DIAGNOSIS — N433 Hydrocele, unspecified: Secondary | ICD-10-CM | POA: Diagnosis not present

## 2019-06-27 LAB — URINALYSIS, COMPLETE (UACMP) WITH MICROSCOPIC
Bilirubin Urine: NEGATIVE
Glucose, UA: NEGATIVE mg/dL
Hgb urine dipstick: NEGATIVE
Ketones, ur: NEGATIVE mg/dL
Leukocytes,Ua: NEGATIVE
Nitrite: NEGATIVE
Protein, ur: NEGATIVE mg/dL
RBC / HPF: NONE SEEN RBC/hpf (ref 0–5)
Specific Gravity, Urine: 1.025 (ref 1.005–1.030)
pH: 5.5 (ref 5.0–8.0)

## 2019-06-27 MED ORDER — TADALAFIL 5 MG PO TABS: 5.0000 mg | ORAL_TABLET | Freq: Every day | ORAL | 11 refills | Status: DC | PRN

## 2019-06-27 NOTE — Telephone Encounter (Signed)
-----   Message from Billey Co, MD sent at 06/27/2019 11:10 AM EDT ----- UA completely negative, follow up as scheduled  Nickolas Madrid, MD 06/27/2019

## 2019-06-27 NOTE — Progress Notes (Signed)
06/27/19 9:10 AM   Brett Boone 1945-12-03 [id number]  CC: ED, BPH, Right hydrocele  HPI: I saw Brett Boone in urology clinic today for evaluation of the above issues.  He is a 74 year old male with obesity but no other medical problems who reports worsening ED over the last few months, worsening urinary symptoms over the last few months, as well as a bulky right hydrocele that seems to have enlarged over the last year making sexual intercourse more challenging.  He previously was seen by Dr. Bernardo Heater at [facility] in 2017 and trialed on sildenafil which did help with his erections.  The sildenafil has decreased in effectiveness over the last few months, and he is also had an occasional headache from the sildenafil.  He opted for observation in terms of his hydrocele at that point.  He has never tried any medications for his urination.  IPSS score today is 13, with quality of life mixed.  He has been unable to void for urine sample yet.  He drinks primarily artificially sweetened tea during the day secondary to his significant weight gain during Covid and wanting to avoid sugar.  He has nocturia 1-2 times per night.  He denies any history of UTI or urinary retention, or gross hematuria.  He does report an episode of trauma when he was younger when he was hit in the right testicle with a baseball.  PSA has always been within the normal range.  PMH: Past Medical History:  Diagnosis Date  . Back injury   . Heart murmur   . Venous stasis dermatitis of both lower extremities     Surgical History: Past Surgical History:  Procedure Laterality Date  . CATARACT EXTRACTION W/PHACO Right 03/09/2017   Procedure: CATARACT EXTRACTION PHACO AND INTRAOCULAR LENS PLACEMENT ([place]) RIGHT;  Surgeon: Eulogio Bear, MD;  Location: [place];  Service: Ophthalmology;  Laterality: Right;  . COLONOSCOPY  2013   normal- cleared for 5 yrs- Dr Beckey Downing  . COLONOSCOPY WITH PROPOFOL N/A 02/17/2017   Procedure: COLONOSCOPY WITH PROPOFOL;  Surgeon: Lin Landsman, MD;  Location: [place];  Service: Endoscopy;  Laterality: N/A;  . EPIGASTRIC HERNIA REPAIR N/A 07/06/2014   Procedure: HERNIA REPAIR EPIGASTRIC ADULT;  Surgeon: Molly Maduro, MD;  Location: [facility];  Service: General;  Laterality: N/A;  . HERNIA REPAIR Bilateral   . INSERTION OF MESH N/A 07/06/2014   Procedure: INSERTION OF MESH;  Surgeon: Molly Maduro, MD;  Location: [facility];  Service: General;  Laterality: N/A;  . POLYPECTOMY  02/17/2017   Procedure: POLYPECTOMY;  Surgeon: Lin Landsman, MD;  Location: [place];  Service: Endoscopy;;  . SHOULDER ARTHROSCOPY Left 2008  . SINUS EXPLORATION     Family History: Family History  Problem Relation Age of Onset  . Diabetes Father   . Heart disease Father 4  . Stroke Mother     Social History:  reports that he quit smoking about 13 months ago. His smoking use included cigarettes. He has a 12.50 pack-year smoking history. He has quit using smokeless tobacco. He reports current alcohol use. He reports that he does not use drugs.  Physical Exam: BP (!) 145/83   Pulse 65   Ht 5\' 10"  (1.778 m)   Wt 259 lb (117.5 kg)   BMI 37.16 kg/m    Constitutional:  Alert and oriented, No acute distress.  Obese Cardiovascular: No clubbing, cyanosis, or edema. Respiratory: Normal respiratory effort, no increased work of breathing.  GU: No CVA tenderness, phallus without lesions, widely patent meatus, moderate size right hydrocele making palpation of right testicle challenging but no obvious masses, small left hydrocele, no testicular masses  Laboratory Data: Reviewed  Pertinent Imaging: None to review  Assessment & Plan:   In summary, he is a obese 74 year old male who is otherwise relatively healthy who presents with worsening erectile dysfunction, BPH and urinary symptoms, and increasing bother from a moderate size right hydrocele.  Regarding his  erectile dysfunction, we reviewed the AUA guidelines regarding behavioral and lifestyle strategies, as well as recommending a testosterone level.  We discussed the PDE 5 inhibitors at length, and I recommended a trial of Cialis 5 mg either daily or on demand.  Regarding his urinary symptoms, we discussed behavioral strategies at length, avoiding artificial sweeteners, double voiding prior to bed, minimizing fluids in the evening.  We also discussed that Cialis daily or every other day can help with BPH symptoms.  Finally, regarding his hydrocele we discussed treatment options including observation, aspiration, or definitive management with hydrocelectomy.  I recommended a scrotal ultrasound with his history of testicular trauma in the past as well as the swelling on the left side.  AM testosterone level, call with results Trial of Cialis 5 mg daily for ED and BPH, side effects discussed Scrotal ultrasound to evaluate scrotal swelling RTC 4 to 6 weeks for symptom check, and review above results  Brett Madrid, MD 06/27/2019  [facility] [address], [place], [place] [address] [phone]

## 2019-06-27 NOTE — Telephone Encounter (Signed)
Patient notified

## 2019-06-27 NOTE — Patient Instructions (Signed)
1. Avoiding artifical sweeteners 2. Avoid drinking high volumes of fluid before bed, urinate twice before bed  Hydrocelectomy, Adult  A hydrocelectomy is a surgical procedure to remove a collection of fluid (hydrocele) from the scrotum, which is the pouch that holds the testicles. You may need to have this procedure if a hydrocele is causing painful swelling in your scrotum. Tell a health care provider about:  Any allergies you have.  All medicines you are taking, including vitamins, herbs, eye drops, creams, and over-the-counter medicines.  Any problems you or family members have had with anesthetic medicines.  Any blood disorders you have.  Any surgeries you have had.  Any medical conditions you have. What are the risks? Generally, this is a safe procedure. However, problems may occur, including:  Bleeding into the scrotum (scrotal hematoma).  Damage to nearby structures or organs, including to the testicle or the tube that carries sperm out of the testicle (vas deferens).  Infection.  Allergic reactions to medicines. What happens before the procedure? Staying hydrated Follow instructions from your health care provider about hydration, which may include:  Up to 2 hours before the procedure - you may continue to drink clear liquids, such as water, clear fruit juice, black coffee, and plain tea. Eating and drinking restrictions Follow instructions from your health care provider about eating and drinking, which may include:  8 hours before the procedure - stop eating heavy meals or foods, such as meat, fried foods, or fatty foods.  6 hours before the procedure - stop eating light meals or foods, such as toast or cereal.  6 hours before the procedure - stop drinking milk or drinks that contain milk.  2 hours before the procedure - stop drinking clear liquids. Medicines Ask your health care provider about:  Changing or stopping your regular medicines. This is especially  important if you are taking diabetes medicines or blood thinners.  Taking medicines such as aspirin and ibuprofen. These medicines can thin your blood. Do not take these medicines unless your health care provider tells you to take them.  Taking over-the-counter medicines, vitamins, herbs, and supplements. General instructions  Do not use any products that contain nicotine or tobacco for at least 4 weeks before the procedure. These products include cigarettes, e-cigarettes, and chewing tobacco. If you need help quitting, ask your health care provider.  Plan to have someone take you home from the hospital or clinic.  Plan to have a responsible adult care for you for at least 24 hours after you leave the hospital or clinic. This is important.  Ask your health care provider: ? How your surgery site will be marked. ? What steps will be taken to help prevent infection. These may include:  Removing hair at the surgery site.  Washing skin with a germ-killing soap.  Taking antibiotic medicine. What happens during the procedure?  An IV will be inserted into one of your veins.  You will be given one or more of the following: ? A medicine to make you relax (sedative). ? A medicine to make you fall asleep (general anesthetic).  A small incision will be made through the skin of your scrotum.  Your testicle and the hydrocele will be located, and the hydrocele sac will be opened with an incision.  The fluid will be drained from the hydrocele. Part of the hydrocele sac may be removed.  The hydrocele will be closed with stitches that dissolve (absorbable sutures). This prevents fluid from building up again.  If your hydrocele is large, you may have a thin, rubber drain placed to allow fluid to drain after the procedure.  The incision in your scrotum will be closed with absorbable sutures, skin glue, or adhesives.  A bandage (dressing) will be placed over the incision. The dressing may be held  in place with an athletic support strap (scrotal support). The procedure may vary among health care providers and hospitals. What happens after the procedure?   Your blood pressure, heart rate, breathing rate, and blood oxygen level will be monitored until you leave the hospital or clinic.  You will be given pain medicine as needed.  Your IV will be removed, and your insertion site will be checked for bleeding.  Do not drive for 24 hours if you were given a sedative during your procedure.  You may need to wear a scrotal support. This holds the dressing in place and supports your scrotum. Summary  A hydrocelectomy is a surgical procedure to remove a collection of fluid (hydrocele) from the scrotum, which is the pouch that holds the testicles. You may need to have this procedure if a hydrocele is causing painful swelling in your scrotum.  During the procedure, the hydrocele will be drained and then closed with stitches that dissolve (absorbable sutures). This prevents fluid from building up again.  If your hydrocele is large, you may have a thin, rubber drain placed to allow fluid to drain after the procedure.  You may need to wear a scrotal support after your procedure. This holds the dressing in place and supports your scrotum. This information is not intended to replace advice given to you by your health care provider. Make sure you discuss any questions you have with your health care provider. Document Revised: 06/07/2018 Document Reviewed: 06/07/2018 Elsevier Patient Education  [place].   Erectile Dysfunction Erectile dysfunction (ED) is the inability to get or keep an erection in order to have sexual intercourse. Erectile dysfunction may include:  Inability to get an erection.  Lack of enough hardness of the erection to allow penetration.  Loss of the erection before sex is finished. What are the causes? This condition may be caused by:  Certain medicines, such  as: ? Pain relievers. ? Antihistamines. ? Antidepressants. ? Blood pressure medicines. ? Water pills (diuretics). ? Ulcer medicines. ? Muscle relaxants. ? Drugs.  Excessive drinking.  Psychological causes, such as: ? Anxiety. ? Depression. ? Sadness. ? Exhaustion. ? Performance fear. ? Stress.  Physical causes, such as: ? Artery problems. This may include diabetes, smoking, liver disease, or atherosclerosis. ? High blood pressure. ? Hormonal problems, such as low testosterone. ? Obesity. ? Nerve problems. This may include back or pelvic injuries, diabetes mellitus, multiple sclerosis, or Parkinson disease. What are the signs or symptoms? Symptoms of this condition include:  Inability to get an erection.  Lack of enough hardness of the erection to allow penetration.  Loss of the erection before sex is finished.  Normal erections at some times, but with frequent unsatisfactory episodes.  Low sexual satisfaction in either partner due to erection problems.  A curved penis occurring with erection. The curve may cause pain or the penis may be too curved to allow for intercourse.  Never having nighttime erections. How is this diagnosed? This condition is often diagnosed by:  Performing a physical exam to find other diseases or specific problems with the penis.  Asking you detailed questions about the problem.  Performing blood tests to check for diabetes  mellitus or to measure hormone levels.  Performing other tests to check for underlying health conditions.  Performing an ultrasound exam to check for scarring.  Performing a test to check blood flow to the penis.  Doing a sleep study at home to measure nighttime erections. How is this treated? This condition may be treated by:  Medicine taken by mouth to help you achieve an erection (oral medicine).  Hormone replacement therapy to replace low testosterone levels.  Medicine that is injected into the penis.  Your health care provider may instruct you how to give yourself these injections at home.  Vacuum pump. This is a pump with a ring on it. The pump and ring are placed on the penis and used to create pressure that helps the penis become erect.  Penile implant surgery. In this procedure, you may receive: ? An inflatable implant. This consists of cylinders, a pump, and a reservoir. The cylinders can be inflated with a fluid that helps to create an erection, and they can be deflated after intercourse. ? A semi-rigid implant. This consists of two silicone rubber rods. The rods provide some rigidity. They are also flexible, so the penis can both curve downward in its normal position and become straight for sexual intercourse.  Blood vessel surgery, to improve blood flow to the penis. During this procedure, a blood vessel from a different part of the body is placed into the penis to allow blood to flow around (bypass) damaged or blocked blood vessels.  Lifestyle changes, such as exercising more, losing weight, and quitting smoking. Follow these instructions at home: Medicines   Take over-the-counter and prescription medicines only as told by your health care provider. Do not increase the dosage without first discussing it with your health care provider.  If you are using self-injections, perform injections as directed by your health care provider. Make sure to avoid any veins that are on the surface of the penis. After giving an injection, apply pressure to the injection site for 5 minutes. General instructions  Exercise regularly, as directed by your health care provider. Work with your health care provider to lose weight, if needed.  Do not use any products that contain nicotine or tobacco, such as cigarettes and e-cigarettes. If you need help quitting, ask your health care provider.  Before using a vacuum pump, read the instructions that come with the pump and discuss any questions with your  health care provider.  Keep all follow-up visits as told by your health care provider. This is important. Contact a health care provider if:  You feel nauseous.  You vomit. Get help right away if:  You are taking oral or injectable medicines and you have an erection that lasts longer than 4 hours. If your health care provider is unavailable, go to the nearest emergency room for evaluation. An erection that lasts much longer than 4 hours can result in permanent damage to your penis.  You have severe pain in your groin or abdomen.  You develop redness or severe swelling of your penis.  You have redness spreading up into your groin or lower abdomen.  You are unable to urinate.  You experience chest pain or a rapid heart beat (palpitations) after taking oral medicines. Summary  Erectile dysfunction (ED) is the inability to get or keep an erection during sexual intercourse. This problem can usually be treated successfully.  This condition is diagnosed based on a physical exam, your symptoms, and tests to determine the cause. Treatment varies  depending on the cause, and may include medicines, hormone therapy, surgery, or vacuum pump.  You may need follow-up visits to make sure that you are using your medicines or devices correctly.  Get help right away if you are taking or injecting medicines and you have an erection that lasts longer than 4 hours. This information is not intended to replace advice given to you by your health care provider. Make sure you discuss any questions you have with your health care provider. Document Revised: 12/25/2016 Document Reviewed: 01/29/2016 Elsevier Patient Education  [place].  Tadalafil tablets (Cialis) What is this medicine? TADALAFIL (tah DA la fil) is used to treat erection problems in men. It is also used for enlargement of the prostate gland in men, a condition called benign prostatic hyperplasia or BPH. This medicine improves urine  flow and reduces BPH symptoms. This medicine can also treat both erection problems and BPH when they occur together. This medicine may be used for other purposes; ask your health care provider or pharmacist if you have questions. COMMON BRAND NAME(S): Kathaleen Bury, Cialis What should I tell my health care provider before I take this medicine? They need to know if you have any of these conditions:  bleeding disorders  eye or vision problems, including a rare inherited eye disease called retinitis pigmentosa  anatomical deformation of the penis, Peyronie's disease, or history of priapism (painful and prolonged erection)  heart disease, angina, a history of heart attack, irregular heart beats, or other heart problems  high or low blood pressure  history of blood diseases, like sickle cell anemia or leukemia  history of stomach bleeding  kidney disease  liver disease  stroke  an unusual or allergic reaction to tadalafil, other medicines, foods, dyes, or preservatives  pregnant or trying to get pregnant  breast-feeding How should I use this medicine? Take this medicine by mouth with a glass of water. Follow the directions on the prescription label. You may take this medicine with or without meals. When this medicine is used for erection problems, your doctor may prescribe it to be taken once daily or as needed. If you are taking the medicine as needed, you may be able to have sexual activity 30 minutes after taking it and for up to 36 hours after taking it. Whether you are taking the medicine as needed or once daily, you should not take more than one dose per day. If you are taking this medicine for symptoms of benign prostatic hyperplasia (BPH) or to treat both BPH and an erection problem, take the dose once daily at about the same time each day. Do not take your medicine more often than directed. Talk to your pediatrician regarding the use of this medicine in children. Special care may  be needed. Overdosage: If you think you have taken too much of this medicine contact a poison control center or emergency room at once. NOTE: This medicine is only for you. Do not share this medicine with others. What if I miss a dose? If you are taking this medicine as needed for erection problems, this does not apply. If you miss a dose while taking this medicine once daily for an erection problem, benign prostatic hyperplasia, or both, take it as soon as you remember, but do not take more than one dose per day. What may interact with this medicine? Do not take this medicine with any of the following medications:  nitrates like amyl nitrite, isosorbide dinitrate, isosorbide mononitrate, nitroglycerin  other medicines for erectile dysfunction like avanafil, sildenafil, vardenafil  other tadalafil products ([employer]circa)  riociguat This medicine may also interact with the following medications:  certain drugs for high blood pressure  certain drugs for the treatment of HIV infection or AIDS  certain drugs used for fungal or yeast infections, like fluconazole, itraconazole, ketoconazole, and voriconazole  certain drugs used for seizures like carbamazepine, phenytoin, and phenobarbital  grapefruit juice  macrolide antibiotics like clarithromycin, erythromycin, troleandomycin  medicines for prostate problems  rifabutin, rifampin or rifapentine This list may not describe all possible interactions. Give your health care provider a list of all the medicines, herbs, non-prescription drugs, or dietary supplements you use. Also tell them if you smoke, drink alcohol, or use illegal drugs. Some items may interact with your medicine. What should I watch for while using this medicine? If you notice any changes in your vision while taking this drug, call your doctor or health care professional as soon as possible. Stop using this medicine and call your health care provider right away if you have a loss  of sight in one or both eyes. Contact your doctor or health care professional right away if the erection lasts longer than 4 hours or if it becomes painful. This may be a sign of serious problem and must be treated right away to prevent permanent damage. If you experience symptoms of nausea, dizziness, chest pain or arm pain upon initiation of sexual activity after taking this medicine, you should refrain from further activity and call your doctor or health care professional as soon as possible. Do not drink alcohol to excess (examples, 5 glasses of wine or 5 shots of whiskey) when taking this medicine. When taken in excess, alcohol can increase your chances of getting a headache or getting dizzy, increasing your heart rate or lowering your blood pressure. Using this medicine does not protect you or your partner against HIV infection (the virus that causes AIDS) or other sexually transmitted diseases. What side effects may I notice from receiving this medicine? Side effects that you should report to your doctor or health care professional as soon as possible:  allergic reactions like skin rash, itching or hives, swelling of the face, lips, or tongue  breathing problems  changes in hearing  changes in vision  chest pain  fast, irregular heartbeat  prolonged or painful erection  seizures Side effects that usually do not require medical attention (report to your doctor or health care professional if they continue or are bothersome):  back pain  dizziness  flushing  headache  indigestion  muscle aches  nausea  stuffy or runny nose This list may not describe all possible side effects. Call your doctor for medical advice about side effects. You may report side effects to FDA at 1-800-FDA-1088. Where should I keep my medicine? Keep out of the reach of children. Store at room temperature between 15 and 30 degrees C (59 and 86 degrees F). Throw away any unused medicine after the  expiration date. NOTE: This sheet is a summary. It may not cover all possible information. If you have questions about this medicine, talk to your doctor, pharmacist, or health care provider.  2020 Elsevier/Gold Standard (2013-06-02 13:15:49)

## 2019-07-03 ENCOUNTER — Other Ambulatory Visit: Payer: Self-pay

## 2019-07-03 ENCOUNTER — Other Ambulatory Visit
Admission: RE | Admit: 2019-07-03 | Discharge: 2019-07-03 | Disposition: A | Discharge status: Home / Self Care | Payer: Medicare Other | Attending: Urology | Admitting: Urology

## 2019-07-03 DIAGNOSIS — R351 Nocturia: Secondary | ICD-10-CM | POA: Insufficient documentation

## 2019-07-03 DIAGNOSIS — N401 Enlarged prostate with lower urinary tract symptoms: Secondary | ICD-10-CM | POA: Diagnosis present

## 2019-07-04 ENCOUNTER — Telehealth: Payer: Self-pay

## 2019-07-04 LAB — TESTOSTERONE: Testosterone: 531 ng/dL (ref 264–916)

## 2019-07-04 NOTE — Telephone Encounter (Signed)
-----   Message from Billey Co, MD sent at 07/04/2019 12:30 PM EDT ----- Testosterone level normal, keep follow up as scheduled  Nickolas Madrid, MD 07/04/2019

## 2019-07-04 NOTE — Telephone Encounter (Signed)
Called pt no answer. LM for pt informing him of the information below per DPR. Advised to call back for questions or concerns.

## 2019-07-14 ENCOUNTER — Telehealth: Payer: Self-pay

## 2019-07-14 NOTE — Telephone Encounter (Signed)
Message left notifying patient that it is time to schedule the low dose lung cancer screening CT scan.  Instructed patient to return call to Shawn Perkins at [phone] to verify information prior to CT scan being scheduled.    

## 2019-07-17 ENCOUNTER — Other Ambulatory Visit: Payer: Self-pay

## 2019-07-17 ENCOUNTER — Ambulatory Visit
Admission: RE | Admit: 2019-07-17 | Discharge: 2019-07-17 | Disposition: A | Discharge status: Home / Self Care | Payer: Medicare Other | Source: Ambulatory Visit | Attending: Urology | Admitting: Urology

## 2019-07-17 DIAGNOSIS — N433 Hydrocele, unspecified: Secondary | ICD-10-CM | POA: Diagnosis not present

## 2019-07-17 IMAGING — US US SCROTUM W/ DOPPLER COMPLETE
1 series · 13 of 25 positions shown · non-contrast
Comparison: None

CLINICAL DATA: Hydrocele swelling for many years increased in size
over the past year

EXAM:
SCROTAL ULTRASOUND
DOPPLER ULTRASOUND OF THE TESTICLES
TECHNIQUE: Complete ultrasound examination of the testicles, epididymis, and
other scrotal structures was performed. Color and spectral Doppler
ultrasound were also utilized to evaluate blood flow to the
testicles.

[Series 1: us scrotum w/ doppler complete · 0.09mm/px · 13 of 72 slices shown]
[im 1/72]
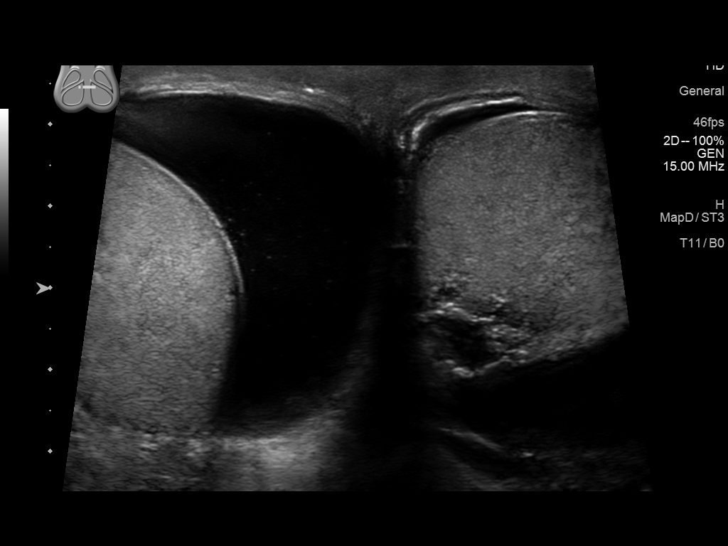
[im 6/72]
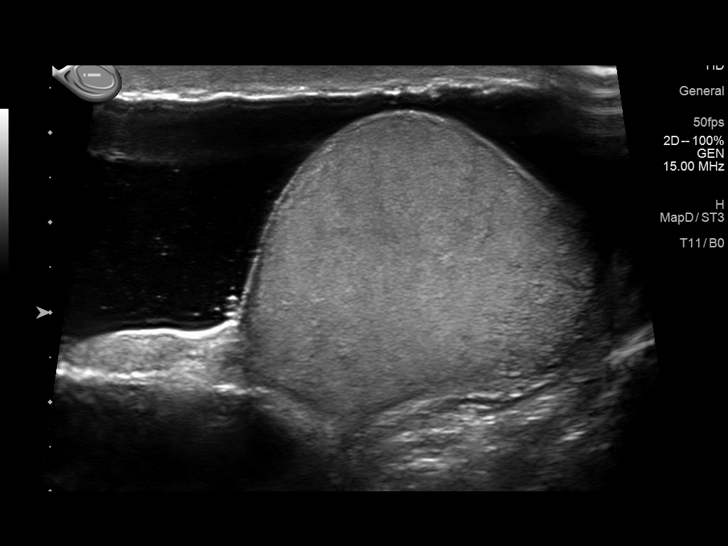
[im 12/72]
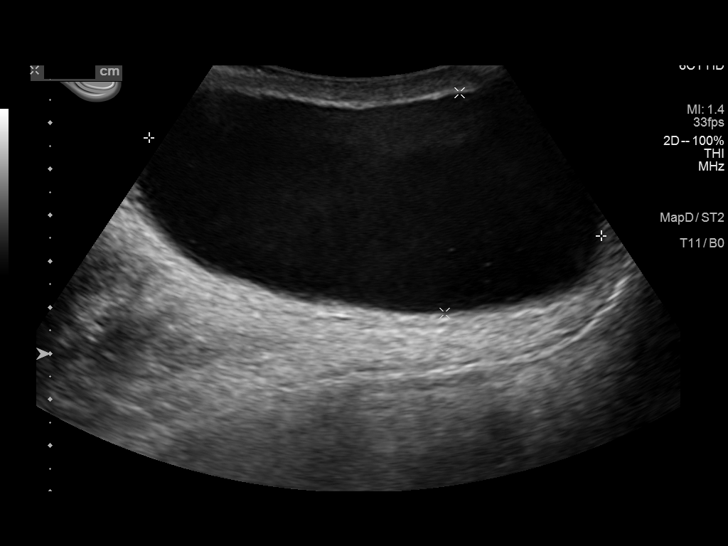
[im 18/72]
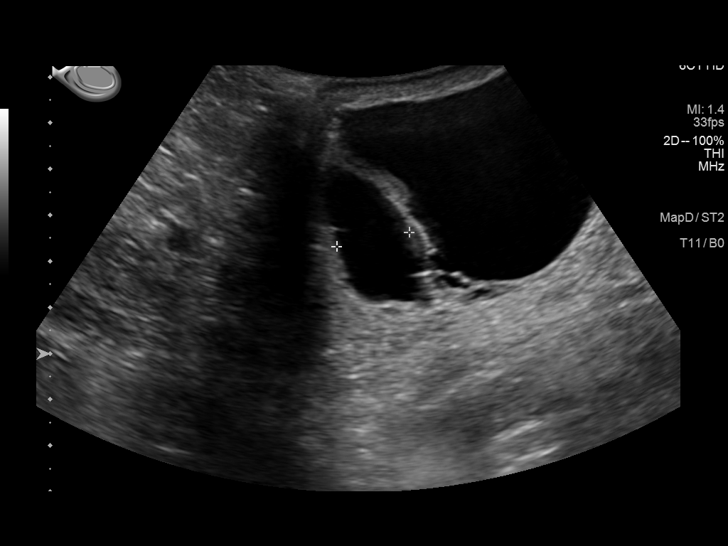
[im 24/72]
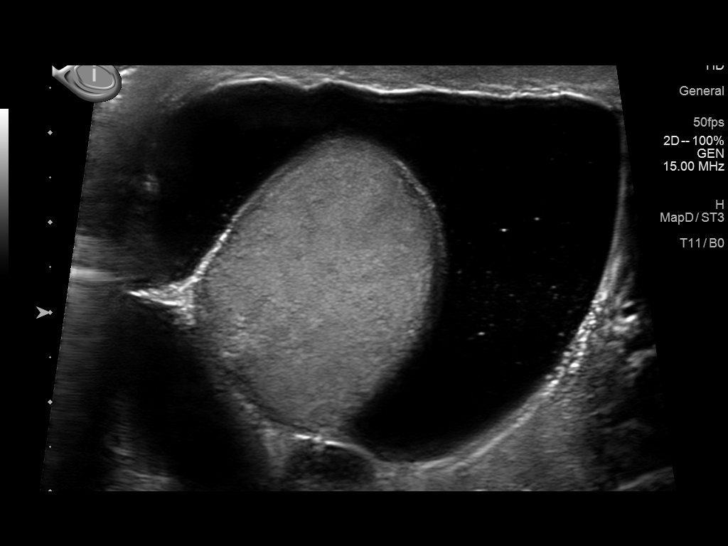
[im 30/72]
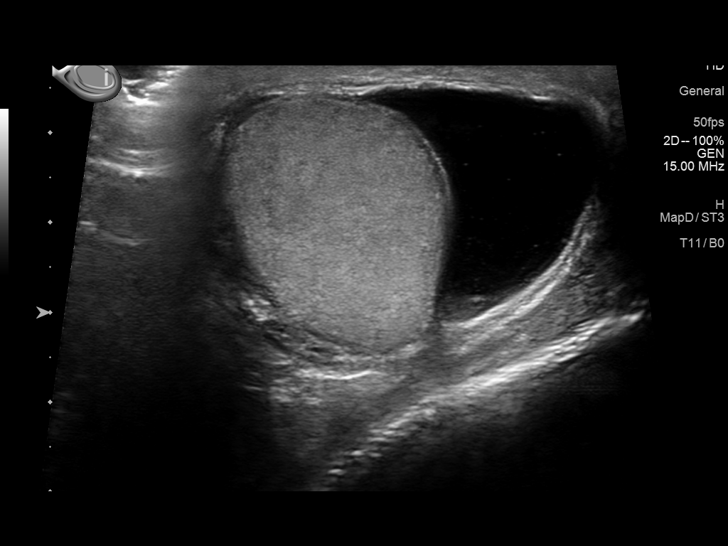
[im 36/72]
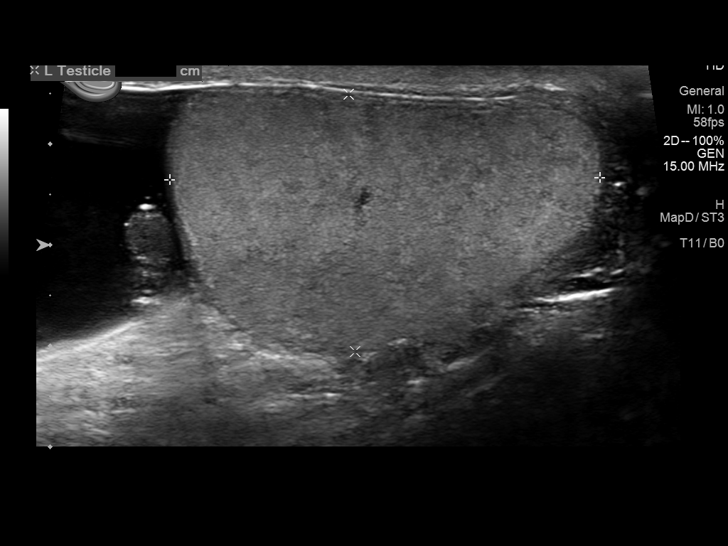
[im 42/72]
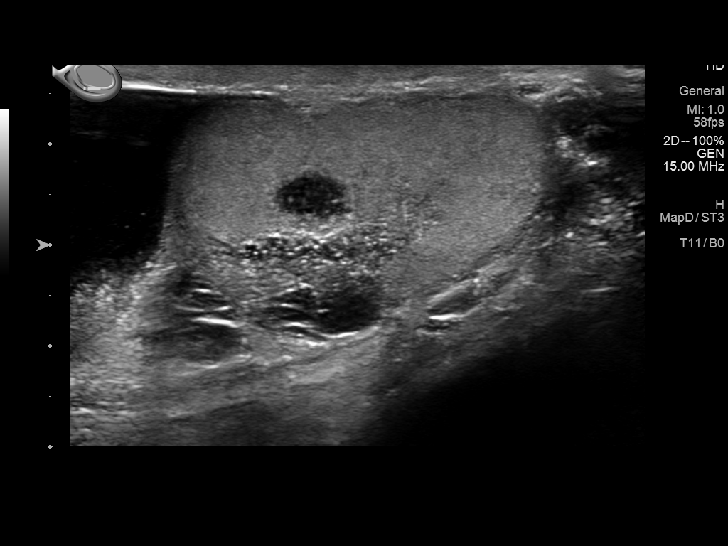
[im 48/72]
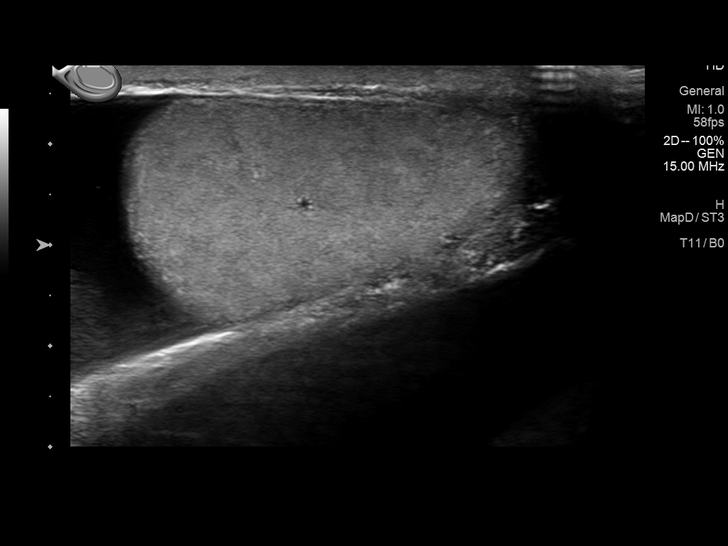
[im 54/72]
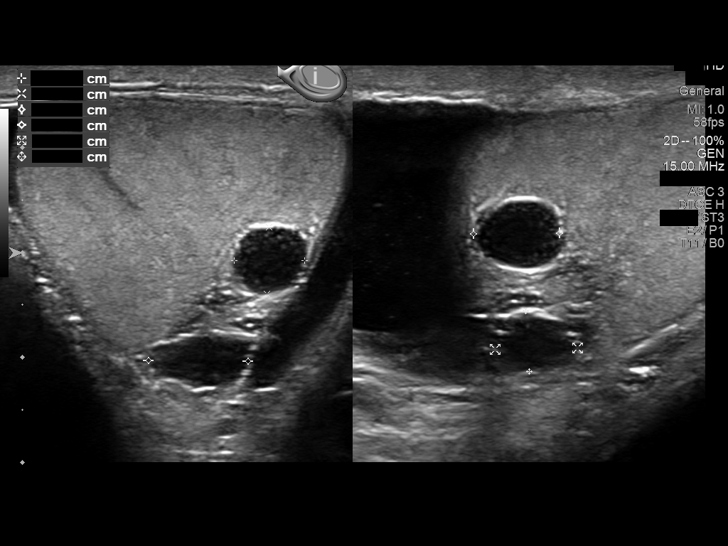
[im 60/72]
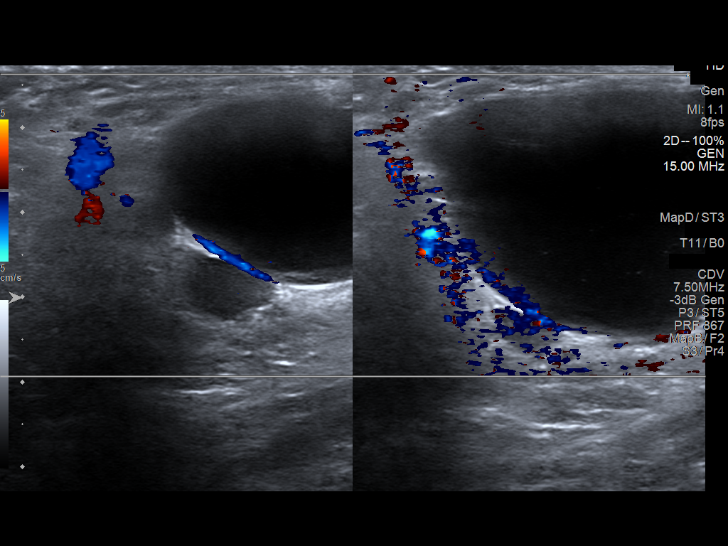
[im 66/72]
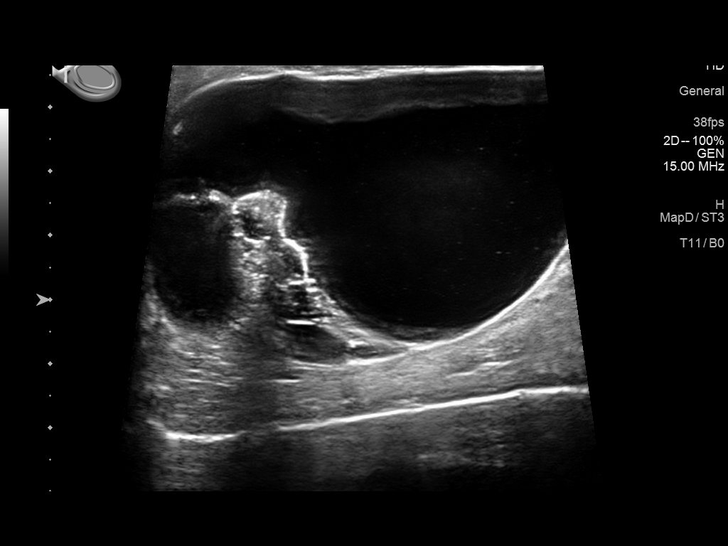
[im 72/72]
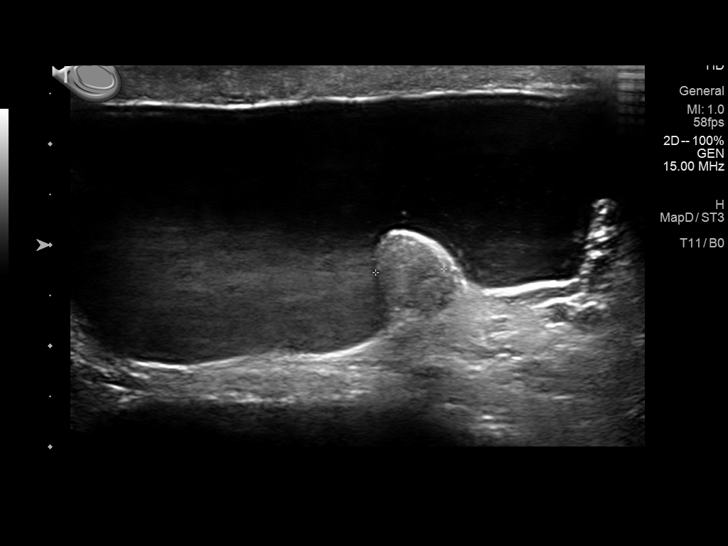

[13 of 25 positions shown; findings below may reference images not displayed]

FINDINGS: Right testicle

Measurements: 4.1 x 3.0 x 3.0 cm. Normal morphology. Minimal tubular
ectasia of the rete testis. No focal mass or calcification. Internal
blood flow present on color Doppler imaging.

Left testicle

Measurements: 4.3 x 2.6 x 3.0 cm. Normal echogenicity. Tubular
ectasia of the rete testis. Two cysts are additionally seen within
the LEFT testis, 7 x 6 x 8 mm and 10 x 8 x 6 mm. No additional mass.
Internal blood flow present on color Doppler imaging.

Right epididymis:  Normal in size and appearance.

Left epididymis:  Normal in size and appearance.

Hydrocele: Large partially loculated RIGHT hydrocele. Small LEFT
hydrocele. Debris within the hydroceles bilaterally.

Varicocele:  None visualized.

Pulsed Doppler interrogation of both testes demonstrates normal low
resistance arterial and venous waveforms bilaterally.
IMPRESSION: BILATERAL hydroceles, larger and mildly complicated on RIGHT.

Two intra testicular cysts of the LEFT testis, 10 mm and 8 mm in
greatest sizes.

## 2019-07-19 ENCOUNTER — Ambulatory Visit (INDEPENDENT_AMBULATORY_CARE_PROVIDER_SITE_OTHER): Payer: Medicare Other | Admitting: Family Medicine

## 2019-07-19 ENCOUNTER — Other Ambulatory Visit: Payer: Self-pay

## 2019-07-19 ENCOUNTER — Encounter: Payer: Self-pay | Admitting: Family Medicine

## 2019-07-19 VITALS — BP 118/66 | HR 70 | Ht 71.0 in | Wt 252.0 lb

## 2019-07-19 DIAGNOSIS — Z0000 Encounter for general adult medical examination without abnormal findings: Secondary | ICD-10-CM

## 2019-07-19 DIAGNOSIS — D692 Other nonthrombocytopenic purpura: Secondary | ICD-10-CM | POA: Diagnosis not present

## 2019-07-19 DIAGNOSIS — E785 Hyperlipidemia, unspecified: Secondary | ICD-10-CM | POA: Diagnosis not present

## 2019-07-19 DIAGNOSIS — I350 Nonrheumatic aortic (valve) stenosis: Secondary | ICD-10-CM | POA: Diagnosis not present

## 2019-07-19 DIAGNOSIS — N401 Enlarged prostate with lower urinary tract symptoms: Secondary | ICD-10-CM

## 2019-07-19 DIAGNOSIS — D539 Nutritional anemia, unspecified: Secondary | ICD-10-CM | POA: Diagnosis not present

## 2019-07-19 DIAGNOSIS — I38 Endocarditis, valve unspecified: Secondary | ICD-10-CM

## 2019-07-19 DIAGNOSIS — R351 Nocturia: Secondary | ICD-10-CM

## 2019-07-19 DIAGNOSIS — I700 Atherosclerosis of aorta: Secondary | ICD-10-CM

## 2019-07-19 DIAGNOSIS — Z6836 Body mass index (BMI) 36.0-36.9, adult: Secondary | ICD-10-CM

## 2019-07-19 DIAGNOSIS — D649 Anemia, unspecified: Secondary | ICD-10-CM | POA: Diagnosis not present

## 2019-07-19 NOTE — Progress Notes (Signed)
Date:  07/19/2019   Name:  Brett Boone   DOB:  May 05, 1945   MRN:  [id number]   Chief Complaint: Annual Exam  Patient is a 74 year old male who presents for a comprehensive physical exam. The patient reports the following problems: to be eval urology. Health maintenance has been reviewed up to date.   Lab Results  Component Value Date   CREATININE 1.07 01/04/2018   BUN 13 01/04/2018   NA 139 01/04/2018   K 4.9 01/04/2018   CL 102 01/04/2018   CO2 24 01/04/2018   Lab Results  Component Value Date   CHOL 67 (L) 01/04/2018   HDL 47 01/04/2018   LDLCALC 13 01/04/2018   TRIG 36 01/04/2018   CHOLHDL 1.4 01/04/2018   No results found for: TSH Lab Results  Component Value Date   HGBA1C 5.8 (H) 08/30/2014   No results found for: WBC, HGB, HCT, MCV, PLT No results found for: ALT, AST, GGT, ALKPHOS, BILITOT   Review of Systems  Constitutional: Negative for chills and fever.  HENT: Negative for drooling, ear discharge, ear pain, postnasal drip, rhinorrhea, sinus pain and sore throat.   Respiratory: Negative for cough, shortness of breath and wheezing.   Cardiovascular: Negative for chest pain, palpitations and leg swelling.  Gastrointestinal: Negative for abdominal pain, blood in stool, constipation, diarrhea and nausea.  Endocrine: Negative for polydipsia.  Genitourinary: Negative for dysuria, frequency, hematuria and urgency.  Musculoskeletal: Negative for back pain, myalgias and neck pain.  Skin: Negative for rash.  Allergic/Immunologic: Negative for environmental allergies.  Neurological: Negative for dizziness and headaches.  Hematological: Does not bruise/bleed easily.  Psychiatric/Behavioral: Negative for suicidal ideas. The patient is not nervous/anxious.     Patient Active Problem List   Diagnosis Date Noted  . History of adenomatous polyp of colon   . 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 Shortness of breath 09/03/2014  . Annual physical exam 08/30/2014    . 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 Amoxicillin-Pot Clavulanate Hives and Nausea And Vomiting    Past Surgical History:  Procedure Laterality Date  . CATARACT EXTRACTION W/PHACO Right 03/09/2017   Procedure: CATARACT EXTRACTION PHACO AND INTRAOCULAR LENS PLACEMENT ([place]) RIGHT;  Surgeon: Eulogio Bear, MD;  Location: [facility];  Service: Ophthalmology;  Laterality: Right;  . COLONOSCOPY  2013   normal- cleared for 5 yrs- Dr Beckey Downing  . COLONOSCOPY WITH PROPOFOL N/A 02/17/2017   Procedure: COLONOSCOPY WITH PROPOFOL;  Surgeon: Lin Landsman, MD;  Location: [facility];  Service: Endoscopy;  Laterality: N/A;  . EPIGASTRIC HERNIA REPAIR N/A 07/06/2014   Procedure: HERNIA REPAIR EPIGASTRIC ADULT;  Surgeon: Molly Maduro, MD;  Location: [facility];  Service: General;  Laterality: N/A;  . HERNIA REPAIR Bilateral   . INSERTION OF MESH N/A 07/06/2014   Procedure: INSERTION OF MESH;  Surgeon: Molly Maduro, MD;  Location: [facility];  Service: General;  Laterality: N/A;  . POLYPECTOMY  02/17/2017   Procedure: POLYPECTOMY;  Surgeon: Lin Landsman, MD;  Location: [facility];  Service: Endoscopy;;  . SHOULDER ARTHROSCOPY Left 2008  . SINUS EXPLORATION      Social History   Tobacco Use  . Smoking status: Former Smoker    Packs/day: 0.25    Years: 50.00    Pack years: 12.50    Types: Cigarettes    Quit date: 05/03/2018    Years since quitting: 1.2  . Smokeless tobacco:  Former Systems developer  . Tobacco comment: pt quit smoking  Vaping Use  . Vaping Use: Never used  Substance Use Topics  . Alcohol use: Yes    Comment: Occasional -1x/month  . Drug use: No     Medication list has been reviewed and updated.  Current Meds  Medication Sig  . tadalafil (CIALIS) 5 MG tablet Take 5 mg by mouth daily as needed for erectile dysfunction.   . [DISCONTINUED] tadalafil (CIALIS) 5 MG tablet Take 1 tablet (5 mg total) by mouth daily as  needed for erectile dysfunction (ok to take daily or only as needed for sexual activity).    PHQ 2/9 Scores 07/19/2019 06/07/2019 02/06/2019 12/15/2017  PHQ - 2 Score 0 0 0 0  PHQ- 9 Score 0 0 - -    GAD 7 : Generalized Anxiety Score 07/19/2019 06/07/2019  Nervous, Anxious, on Edge 0 1  Control/stop worrying 0 0  Worry too much - different things 0 0  Trouble relaxing 0 0  Restless 0 0  Easily annoyed or irritable 0 0  Afraid - awful might happen 0 0  Total GAD 7 Score 0 1  Anxiety Difficulty Not difficult at all Not difficult at all    BP Readings from Last 3 Encounters:  07/19/19 118/66  06/27/19 (!) 145/83  06/07/19 134/66    Physical Exam Vitals and nursing note reviewed.  Constitutional:      Appearance: Normal appearance. He is well-developed and well-groomed. He is obese.  HENT:     Head: Normocephalic.     Jaw: There is normal jaw occlusion.     Right Ear: Hearing, tympanic membrane, ear canal and external ear normal.     Left Ear: Hearing, tympanic membrane, ear canal and external ear normal.     Nose: Nose normal. No nasal deformity, congestion or rhinorrhea.     Mouth/Throat:     Lips: Pink.     Mouth: Mucous membranes are moist. Mucous membranes are pale.     Dentition: Normal dentition.     Palate: No mass.  Eyes:     General: Lids are normal. Vision grossly intact. Gaze aligned appropriately. No scleral icterus.       Right eye: No discharge.        Left eye: No discharge.     Conjunctiva/sclera: Conjunctivae normal.     Pupils: Pupils are equal, round, and reactive to light.     Funduscopic exam:    Right eye: Red reflex present.        Left eye: Red reflex present. Neck:     Thyroid: No thyroid mass, thyromegaly or thyroid tenderness.     Vascular: Normal carotid pulses. No carotid bruit, hepatojugular reflux or JVD.     Trachea: No tracheal deviation.  Cardiovascular:     Rate and Rhythm: Normal rate and regular rhythm.     Pulses: Normal pulses.           Carotid pulses are 2+ on the right side and 2+ on the left side.      Radial pulses are 2+ on the right side and 2+ on the left side.       Femoral pulses are 2+ on the right side and 2+ on the left side.      Popliteal pulses are 2+ on the right side and 2+ on the left side.       Dorsalis pedis pulses are 2+ on the right side and 2+ on the left  side.       Posterior tibial pulses are 2+ on the right side and 2+ on the left side.     Heart sounds: S1 normal and S2 normal. Murmur heard.  Systolic murmur is present with a grade of 1/6.  No diastolic murmur is present.  No friction rub. No gallop. No S3 or S4 sounds.   Pulmonary:     Effort: Pulmonary effort is normal. No respiratory distress.     Breath sounds: Normal breath sounds. No decreased breath sounds, wheezing, rhonchi or rales.  Chest:     Chest wall: No mass.     Breasts:        Right: Normal.        Left: Normal.  Abdominal:     General: Abdomen is flat. Bowel sounds are normal.     Palpations: Abdomen is soft. There is no hepatomegaly, splenomegaly or mass.     Tenderness: There is no abdominal tenderness. There is no right CVA tenderness, left CVA tenderness, guarding or rebound.     Hernia: No hernia is present.  Genitourinary:    Penis: Normal.      Testes:        Right: Testicular hydrocele present. Mass not present.        Left: Testicular hydrocele present. Mass not present.  Musculoskeletal:        General: No tenderness. Normal range of motion.     Cervical back: Full passive range of motion without pain, normal range of motion and neck supple.     Right lower leg: No edema.     Left lower leg: No edema.  Lymphadenopathy:     Head:     Right side of head: No submental or submandibular adenopathy.     Left side of head: No submental or submandibular adenopathy.     Cervical: No cervical adenopathy.     Right cervical: No superficial, deep or posterior cervical adenopathy.    Left cervical: No  superficial, deep or posterior cervical adenopathy.  Skin:    General: Skin is warm.     Coloration: Skin is not pale.     Findings: Ecchymosis present. No erythema or rash.  Neurological:     General: No focal deficit present.     Mental Status: He is alert and oriented to person, place, and time.     Cranial Nerves: Cranial nerves are intact. No cranial nerve deficit.     Sensory: Sensation is intact.     Motor: Motor function is intact.     Deep Tendon Reflexes: Reflexes are normal and symmetric.     Reflex Scores:      Tricep reflexes are 2+ on the right side and 2+ on the left side.      Bicep reflexes are 2+ on the right side and 2+ on the left side.      Brachioradialis reflexes are 2+ on the right side and 2+ on the left side.      Patellar reflexes are 2+ on the right side and 2+ on the left side.      Achilles reflexes are 2+ on the right side and 2+ on the left side. Psychiatric:        Behavior: Behavior is cooperative.     Wt Readings from Last 3 Encounters:  07/19/19 252 lb (114.3 kg)  06/27/19 259 lb (117.5 kg)  06/07/19 269 lb (122 kg)    BP 118/66   Pulse 70  Ht 5\' 11"  (1.803 m)   Wt 252 lb (114.3 kg)   SpO2 99%   BMI 35.15 kg/m   Assessment and Plan:   1. Annual physical exam No subjective/objective concerns noted on history and physical exam.  Patient's chart was reviewed for previous encounters and most recent labs, most recent x-rays, and most recent care everywhere.  Will check lipid panel and CMP. - Lipid Panel With LDL/HDL Ratio - Comprehensive metabolic panel  2. Class 2 severe obesity due to excess calories with serious comorbidity and body mass index (BMI) of 36.0 to 36.9 in adult [facility]) Health risks of being over weight were discussed and patient was counseled on weight loss options and exercise.  3. Mild hyperlipidemia Patient has normal to mildly elevated LDL in the past.  We will check a lipid panel for evaluation of lipid status at this  time. - Lipid Panel With LDL/HDL Ratio  4. Benign prostatic hyperplasia with nocturia Patient is followed by urology for BPH  5. Nonrheumatic aortic valve stenosis Review of previous cardiac evaluation noted that there was mild aortic valve disease.  Patient does have a murmur that is consistent with regurgitation and stenosis.  6. Heart valve disease As noted above  7. Aortic atherosclerosis (HCC) Review of CT scan notes aortic atherosclerosis.  We will further control this by controlling his 1 blood pressure and he has hyperlipidemia and encouraging weight loss and congratulated him on his cessation of smoking.  8. Senile purpura ([place]) Multiple areas of ecchymosis noted.  We will check a CBC to rule out any platelet concerns in the meantime most likely secondary to senile purpura. - CBC with Differential/Platelet

## 2019-07-20 ENCOUNTER — Telehealth: Payer: Self-pay | Admitting: *Deleted

## 2019-07-20 DIAGNOSIS — Z122 Encounter for screening for malignant neoplasm of respiratory organs: Secondary | ICD-10-CM

## 2019-07-20 DIAGNOSIS — Z87891 Personal history of nicotine dependence: Secondary | ICD-10-CM

## 2019-07-20 LAB — COMPREHENSIVE METABOLIC PANEL
ALT: 14 IU/L (ref 0–44)
AST: 17 IU/L (ref 0–40)
Albumin/Globulin Ratio: 1.8 (ref 1.2–2.2)
Albumin: 4.1 g/dL (ref 3.7–4.7)
Alkaline Phosphatase: 50 IU/L (ref 48–121)
BUN/Creatinine Ratio: 19 (ref 10–24)
BUN: 20 mg/dL (ref 8–27)
Bilirubin Total: 1 mg/dL (ref 0.0–1.2)
CO2: 24 mmol/L (ref 20–29)
Calcium: 9.1 mg/dL (ref 8.6–10.2)
Chloride: 105 mmol/L (ref 96–106)
Creatinine, Ser: 1.03 mg/dL (ref 0.76–1.27)
GFR calc Af Amer: 84 mL/min/{1.73_m2} (ref >59)
GFR calc non Af Amer: 72 mL/min/{1.73_m2} (ref >59)
Globulin, Total: 2.3 g/dL (ref 1.5–4.5)
Glucose: 100 mg/dL — ABNORMAL HIGH (ref 65–99)
Potassium: 4.7 mmol/L (ref 3.5–5.2)
Sodium: 141 mmol/L (ref 134–144)
Total Protein: 6.4 g/dL (ref 6.0–8.5)

## 2019-07-20 LAB — CBC WITH DIFFERENTIAL/PLATELET
Basophils Absolute: 0 10*3/uL (ref 0.0–0.2)
Basos: 1 %
EOS (ABSOLUTE): 0.2 10*3/uL (ref 0.0–0.4)
Eos: 3 %
Hematocrit: 27.3 % — ABNORMAL LOW (ref 37.5–51.0)
Hemoglobin: 9.5 g/dL — ABNORMAL LOW (ref 13.0–17.7)
Immature Grans (Abs): 0 10*3/uL (ref 0.0–0.1)
Immature Granulocytes: 0 %
Lymphocytes Absolute: 1.1 10*3/uL (ref 0.7–3.1)
Lymphs: 22 %
MCH: 35.3 pg — ABNORMAL HIGH (ref 26.6–33.0)
MCHC: 34.8 g/dL (ref 31.5–35.7)
MCV: 102 fL — ABNORMAL HIGH (ref 79–97)
Monocytes Absolute: 0.5 10*3/uL (ref 0.1–0.9)
Monocytes: 9 %
NRBC: 1 % — ABNORMAL HIGH (ref 0–0)
Neutrophils Absolute: 3.1 10*3/uL (ref 1.4–7.0)
Neutrophils: 65 %
Platelets: 297 10*3/uL (ref 150–450)
RBC: 2.69 x10E6/uL — CL (ref 4.14–5.80)
RDW: 15.4 % (ref 11.6–15.4)
WBC: 4.9 10*3/uL (ref 3.4–10.8)

## 2019-07-20 LAB — LIPID PANEL WITH LDL/HDL RATIO
Cholesterol, Total: 70 mg/dL — ABNORMAL LOW (ref 100–199)
HDL: 45 mg/dL (ref >39)
LDL Chol Calc (NIH): 13 mg/dL (ref 0–99)
LDL/HDL Ratio: 0.3 ratio (ref 0.0–3.6)
Triglycerides: 40 mg/dL (ref 0–149)
VLDL Cholesterol Cal: 12 mg/dL (ref 5–40)

## 2019-07-20 NOTE — Telephone Encounter (Signed)
Patient has been notified that annual lung cancer screening low dose CT scan is due currently or will be in near future. Confirmed that patient is within the age range of 55-77, and asymptomatic, (no signs or symptoms of lung cancer). Patient denies illness that would prevent curative treatment for lung cancer if found. Verified smoking history, (former, quit 2020, 30 pack year). The shared decision making visit was done 05/04/17. Patient is agreeable for CT scan being scheduled.

## 2019-07-24 ENCOUNTER — Other Ambulatory Visit: Payer: Self-pay | Admitting: *Deleted

## 2019-07-24 DIAGNOSIS — N401 Enlarged prostate with lower urinary tract symptoms: Secondary | ICD-10-CM

## 2019-07-24 NOTE — Addendum Note (Signed)
Addended by: Despina Hidden on: 07/24/2019 03:23 PM   Modules accepted: Orders

## 2019-07-25 ENCOUNTER — Encounter: Payer: Self-pay | Admitting: Urology

## 2019-07-25 ENCOUNTER — Other Ambulatory Visit: Payer: Self-pay

## 2019-07-25 ENCOUNTER — Ambulatory Visit: Payer: Medicare Other | Admitting: Urology

## 2019-07-25 ENCOUNTER — Other Ambulatory Visit
Admission: RE | Admit: 2019-07-25 | Discharge: 2019-07-25 | Disposition: A | Discharge status: Home / Self Care | Payer: Medicare Other | Attending: Urology | Admitting: Urology

## 2019-07-25 ENCOUNTER — Telehealth: Payer: Self-pay | Admitting: Family Medicine

## 2019-07-25 VITALS — BP 138/75 | HR 65 | Ht 71.0 in | Wt 250.0 lb

## 2019-07-25 DIAGNOSIS — N432 Other hydrocele: Secondary | ICD-10-CM | POA: Diagnosis not present

## 2019-07-25 DIAGNOSIS — R351 Nocturia: Secondary | ICD-10-CM | POA: Diagnosis not present

## 2019-07-25 DIAGNOSIS — N529 Male erectile dysfunction, unspecified: Secondary | ICD-10-CM | POA: Diagnosis not present

## 2019-07-25 DIAGNOSIS — N401 Enlarged prostate with lower urinary tract symptoms: Secondary | ICD-10-CM

## 2019-07-25 LAB — URINALYSIS, COMPLETE (UACMP) WITH MICROSCOPIC
Bacteria, UA: NONE SEEN
Bilirubin Urine: NEGATIVE
Glucose, UA: NEGATIVE mg/dL
Hgb urine dipstick: NEGATIVE
Ketones, ur: NEGATIVE mg/dL
Leukocytes,Ua: NEGATIVE
Nitrite: NEGATIVE
Protein, ur: 30 mg/dL — AB
RBC / HPF: NONE SEEN RBC/hpf (ref 0–5)
Specific Gravity, Urine: 1.025 (ref 1.005–1.030)
Squamous Epithelial / LPF: NONE SEEN (ref 0–5)
pH: 5.5 (ref 5.0–8.0)

## 2019-07-25 LAB — B12 AND FOLATE PANEL
Folate: 6.9 ng/mL (ref >3.0)
Vitamin B-12: 1083 pg/mL (ref 232–1245)

## 2019-07-25 LAB — SPECIMEN STATUS REPORT

## 2019-07-25 LAB — BLADDER SCAN AMB NON-IMAGING: Scan Result: 0

## 2019-07-25 LAB — FERRITIN: Ferritin: 1020 ng/mL — ABNORMAL HIGH (ref 30–400)

## 2019-07-25 NOTE — Telephone Encounter (Signed)
Pt to come in tomorrow to discuss labs

## 2019-07-25 NOTE — Telephone Encounter (Unsigned)
Copied from [place] [id number]. Topic: General - Other >> Jul 25, 2019 11:36 AM Celene Kras wrote: Reason for CRM: Pts wife called stating that she is needing to have someone give a call regarding the pts lab work. She states that they did have a few questions regarding pts hemoglobin and hematocrit. Please advise.

## 2019-07-25 NOTE — Progress Notes (Signed)
° °  07/25/2019 10:02 AM   Brett Boone 1945/11/24 [id number]  Reason for visit: Follow up ED, BPH, right hydrocele  HPI: I saw [name] back in urology clinic for the above issues.  I originally met him in early June for primarily ED and bothersome hydroceles.  We started him on Cialis 5 mg daily.  He reports significant improvement in his erections and is very satisfied at this time.  He is also had 25 pounds of weight loss which he feels has helped some of his urinary symptoms as well as his erections.  Testosterone was normal at 531.  A scrotal ultrasound was also performed with his distant history of scrotal trauma, and showed bilateral hydroceles R>L, and benign-appearing left testicular cysts.  He denies any urinary complaints today and PVR 0 mL with IPSS score of 3 and QOL mostly satisfied.  Physical Exam: BP 138/75    Pulse 65    Ht '5\' 11"'$  (1.803 m)    Wt 250 lb (113.4 kg)    BMI 34.87 kg/m    Constitutional:  Alert and oriented, No acute distress.  GU: Moderate hydroceles R>L  We discussed options at length for his R>L hydroceles.  We discussed observation versus hydrocelectomy at length.  We discussed the risks and benefits of surgery including bleeding, infection, and recurrence.  We discussed this is typically a 30 to 45-minute procedure that is performed under general anesthesia and would require 3 to 4 weeks of healing time.  He could expect some soreness and bruising for the first 1 to 2 weeks, and would need to wear snug fitting underwear and ice daily postop.  He feels with his weight loss things have improved significantly already, and he would like to hold off on surgery at this time which is very reasonable.  Continue Cialis 5 mg daily, risks and benefits discussed RTC 1 year for symptom check re: ED, BPH, hydroceles  [place], [place] [address], [place], [place] [address] [phone]

## 2019-07-25 NOTE — Patient Instructions (Signed)
Hydrocelectomy, Adult  A hydrocelectomy is a surgical procedure to remove a collection of fluid (hydrocele) from the scrotum, which is the pouch that holds the testicles. You may need to have this procedure if a hydrocele is causing painful swelling in your scrotum. Tell a health care provider about:  Any allergies you have.  All medicines you are taking, including vitamins, herbs, eye drops, creams, and over-the-counter medicines.  Any problems you or family members have had with anesthetic medicines.  Any blood disorders you have.  Any surgeries you have had.  Any medical conditions you have. What are the risks? Generally, this is a safe procedure. However, problems may occur, including:  Bleeding into the scrotum (scrotal hematoma).  Damage to nearby structures or organs, including to the testicle or the tube that carries sperm out of the testicle (vas deferens).  Infection.  Allergic reactions to medicines. What happens before the procedure? Staying hydrated Follow instructions from your health care provider about hydration, which may include:  Up to 2 hours before the procedure - you may continue to drink clear liquids, such as water, clear fruit juice, black coffee, and plain tea. Eating and drinking restrictions Follow instructions from your health care provider about eating and drinking, which may include:  8 hours before the procedure - stop eating heavy meals or foods, such as meat, fried foods, or fatty foods.  6 hours before the procedure - stop eating light meals or foods, such as toast or cereal.  6 hours before the procedure - stop drinking milk or drinks that contain milk.  2 hours before the procedure - stop drinking clear liquids. Medicines Ask your health care provider about:  Changing or stopping your regular medicines. This is especially important if you are taking diabetes medicines or blood thinners.  Taking medicines such as aspirin and ibuprofen.  These medicines can thin your blood. Do not take these medicines unless your health care provider tells you to take them.  Taking over-the-counter medicines, vitamins, herbs, and supplements. General instructions  Do not use any products that contain nicotine or tobacco for at least 4 weeks before the procedure. These products include cigarettes, e-cigarettes, and chewing tobacco. If you need help quitting, ask your health care provider.  Plan to have someone take you home from the hospital or clinic.  Plan to have a responsible adult care for you for at least 24 hours after you leave the hospital or clinic. This is important.  Ask your health care provider: ? How your surgery site will be marked. ? What steps will be taken to help prevent infection. These may include:  Removing hair at the surgery site.  Washing skin with a germ-killing soap.  Taking antibiotic medicine. What happens during the procedure?  An IV will be inserted into one of your veins.  You will be given one or more of the following: ? A medicine to make you relax (sedative). ? A medicine to make you fall asleep (general anesthetic).  A small incision will be made through the skin of your scrotum.  Your testicle and the hydrocele will be located, and the hydrocele sac will be opened with an incision.  The fluid will be drained from the hydrocele. Part of the hydrocele sac may be removed.  The hydrocele will be closed with stitches that dissolve (absorbable sutures). This prevents fluid from building up again.  If your hydrocele is large, you may have a thin, rubber drain placed to allow fluid to drain  after the procedure.  The incision in your scrotum will be closed with absorbable sutures, skin glue, or adhesives.  A bandage (dressing) will be placed over the incision. The dressing may be held in place with an athletic support strap (scrotal support). The procedure may vary among health care providers and  hospitals. What happens after the procedure?   Your blood pressure, heart rate, breathing rate, and blood oxygen level will be monitored until you leave the hospital or clinic.  You will be given pain medicine as needed.  Your IV will be removed, and your insertion site will be checked for bleeding.  Do not drive for 24 hours if you were given a sedative during your procedure.  You may need to wear a scrotal support. This holds the dressing in place and supports your scrotum. Summary  A hydrocelectomy is a surgical procedure to remove a collection of fluid (hydrocele) from the scrotum, which is the pouch that holds the testicles. You may need to have this procedure if a hydrocele is causing painful swelling in your scrotum.  During the procedure, the hydrocele will be drained and then closed with stitches that dissolve (absorbable sutures). This prevents fluid from building up again.  If your hydrocele is large, you may have a thin, rubber drain placed to allow fluid to drain after the procedure.  You may need to wear a scrotal support after your procedure. This holds the dressing in place and supports your scrotum. This information is not intended to replace advice given to you by your health care provider. Make sure you discuss any questions you have with your health care provider. Document Revised: 06/07/2018 Document Reviewed: 06/07/2018 Elsevier Patient Education  2020 Elsevier Inc.   Current Urology, 10(1), 1-14. [email or web address]">  Hydrocelectomy, Adult, Care After This sheet gives you information about how to care for yourself after your procedure. Your health care provider may also give you more specific instructions. If you have problems or questions, contact your health care provider. What can I expect after the procedure? After your procedure, it is common to have:  Mild discomfort and swelling in the pouch that holds your testicles  (scrotum).  Bruising of the scrotum. Follow these instructions at home: Medicines  Take over-the-counter and prescription medicines only as told by your health care provider.  Ask your health care provider if the medicine prescribed to you: ? Requires you to avoid driving or using heavy machinery. ? Can cause constipation. You may need to take these actions to prevent or treat constipation:  Drink enough fluid to keep your urine pale yellow.  Take over-the-counter or prescription medicines.  Eat foods that are high in fiber, such as beans, whole grains, and fresh fruits and vegetables.  Limit foods that are high in fat and processed sugars, such as fried or sweet foods. Bathing  Do not take baths, swim, or use a hot tub until your health care provider approves. Ask your health care provider if you may take showers. You may only be allowed to take sponge baths.  If you were told to wear an athletic support strap (scrotal support), keep it dry. Take it off when you shower or bathe. Incision care   Follow instructions from your health care provider about how to take care of your incision. Make sure you: ? Wash your hands with soap and water before and after you change your bandage (dressing). If soap and water are not available, use hand sanitizer. ? Change your dressing as   told by your health care provider. ? Leave stitches (sutures), skin glue, or adhesive strips in place. These skin closures may need to stay in place for 2 weeks or longer. If adhesive strip edges start to loosen and curl up, you may trim the loose edges. Do not remove adhesive strips completely unless your health care provider tells you to do that.  Check your incision and scrotum every day for signs of infection. Check for: ? More redness, swelling, or pain. ? Fluid or blood. ? Warmth. ? Pus or a bad smell. Managing pain and swelling If directed, put ice on the affected area. To do this:  Put ice in a plastic  bag.  Place a towel between your skin and the bag.  Leave the ice on for 20 minutes, 2-3 times per day.  Activity  Do not do any high-energy activities for as long as told by your health care provider.  Do not lift anything that is heavier than 10 lb (4.5 kg), or the limit that you are told, until your health care provider says that it is safe.  Return to your normal activities as told by your health care provider. Ask your health care provider what activities are safe for you.  Do not drive for 24 hours if you were given a sedative during your procedure.  Ask your health care provider when it is safe to drive. General instructions  Do not use any products that contain nicotine or tobacco, such as cigarettes, e-cigarettes, and chewing tobacco. These can delay incision healing after surgery. If you need help quitting, ask your health care provider.  If you were given a scrotal support, wear it as told by your health care provider.  If you had a drain put in during the procedure, you will need to have it removed at a follow-up visit.  Keep all follow-up visits as told by your health care provider. This is important. Contact a health care provider if:  Your pain is not controlled with medicine.  You have more redness, swelling, or pain around your scrotum.  You have fluid or blood coming from your incision.  Your incision feels warm to the touch.  You have pus or a bad smell coming from your scrotum.  You have a fever. Get help right away if:  You develop shaking, chills, and a fever that is higher than 101.8F (38.8C).  You have redness or swelling that starts at your scrotum and spreads outward to cover your whole groin.  You develop swelling of the legs or difficulty breathing. Summary  After a hydrocelectomy, it is common to have mild discomfort, swelling, and bruising.  Do not take baths, swim, or use a hot tub until your health care provider approves. Ask your  health care provider if you may take showers.  If directed, put ice on the affected area to help with pain and swelling.  Do not do any high-energy activities or lift anything heavier than 10 lb (4.5 kg) for as long as told by your health care provider.  If you were given a scrotal support, keep it dry. Wear the scrotal support as told by your health care provider. This information is not intended to replace advice given to you by your health care provider. Make sure you discuss any questions you have with your health care provider. Document Revised: 06/07/2018 Document Reviewed: 06/07/2018 Elsevier Patient Education  2020 Elsevier Inc.  

## 2019-07-26 ENCOUNTER — Ambulatory Visit (INDEPENDENT_AMBULATORY_CARE_PROVIDER_SITE_OTHER): Payer: Medicare Other | Admitting: Family Medicine

## 2019-07-26 ENCOUNTER — Encounter: Payer: Self-pay | Admitting: Family Medicine

## 2019-07-26 VITALS — BP 120/80 | HR 72 | Ht 71.0 in | Wt 250.0 lb

## 2019-07-26 DIAGNOSIS — D539 Nutritional anemia, unspecified: Secondary | ICD-10-CM

## 2019-07-26 NOTE — Progress Notes (Signed)
Date:  07/26/2019   Name:  Brett Boone   DOB:  [birth year]   MRN:  [id number]   Chief Complaint: lab consultation (discuss lab results)  Anemia Presents for initial visit. Symptoms include weight loss. There has been no abdominal pain, anorexia, bruising/bleeding easily, confusion, fever, leg swelling, light-headedness, malaise/fatigue, pallor, palpitations, paresthesias or pica. (By design) Signs of blood loss that are not present include hematemesis, hematochezia and melena. Past treatments include nothing. There is no history of alcohol abuse, cancer, chronic renal disease, inflammatory bowel disease or recent trauma.    Lab Results  Component Value Date   CREATININE 1.03 07/19/2019   BUN 20 07/19/2019   NA 141 07/19/2019   K 4.7 07/19/2019   CL 105 07/19/2019   CO2 24 07/19/2019   Lab Results  Component Value Date   CHOL 70 (L) 07/19/2019   HDL 45 07/19/2019   LDLCALC 13 07/19/2019   TRIG 40 07/19/2019   CHOLHDL 1.4 01/04/2018   No results found for: TSH Lab Results  Component Value Date   HGBA1C 5.8 (H) 08/30/2014   Lab Results  Component Value Date   WBC 4.9 07/19/2019   HGB 9.5 (L) 07/19/2019   HCT 27.3 (L) 07/19/2019   MCV 102 (H) 07/19/2019   PLT 297 07/19/2019   Lab Results  Component Value Date   ALT 14 07/19/2019   AST 17 07/19/2019   ALKPHOS 50 07/19/2019   BILITOT 1.0 07/19/2019     Review of Systems  Constitutional: Positive for weight loss. Negative for chills, fever and malaise/fatigue.  HENT: Negative for drooling, ear discharge, ear pain and sore throat.   Respiratory: Negative for cough, shortness of breath and wheezing.   Cardiovascular: Negative for chest pain, palpitations and leg swelling.  Gastrointestinal: Negative for abdominal pain, anorexia, blood in stool, constipation, diarrhea, hematemesis, hematochezia, melena and nausea.  Endocrine: Negative for polydipsia.  Genitourinary: Negative for dysuria, frequency, hematuria  and urgency.  Musculoskeletal: Negative for back pain, myalgias and neck pain.  Skin: Negative for pallor and rash.  Allergic/Immunologic: Negative for environmental allergies.  Neurological: Negative for dizziness, light-headedness, headaches and paresthesias.  Hematological: Does not bruise/bleed easily.  Psychiatric/Behavioral: Negative for confusion and suicidal ideas. The patient is not nervous/anxious.     Patient Active Problem List   Diagnosis Date Noted  . Erectile dysfunction 07/25/2019  . Class 2 severe obesity due to excess calories with serious comorbidity and body mass index (BMI) of 36.0 to 36.9 in adult [facility]) 07/19/2019  . History of adenomatous polyp of colon   . 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 Shortness of breath 09/03/2014  . Annual physical exam 08/30/2014  . 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 Amoxicillin-Pot Clavulanate Hives and Nausea And Vomiting    Past Surgical History:  Procedure Laterality Date  . CATARACT EXTRACTION W/PHACO Right 03/09/2017   Procedure: CATARACT EXTRACTION PHACO AND INTRAOCULAR LENS PLACEMENT ([place]) RIGHT;  Surgeon: Eulogio Bear, MD;  Location: [place];  Service: Ophthalmology;  Laterality: Right;  . COLONOSCOPY  2013   normal- cleared for 5 yrs- Dr Beckey Downing  . COLONOSCOPY WITH PROPOFOL N/A 02/17/2017   Procedure: COLONOSCOPY WITH PROPOFOL;  Surgeon: Lin Landsman, MD;  Location: [place];  Service: Endoscopy;  Laterality: N/A;  . EPIGASTRIC HERNIA REPAIR N/A 07/06/2014   Procedure: HERNIA REPAIR EPIGASTRIC ADULT;  Surgeon: Molly Maduro, MD;  Location: [facility];  Service: General;  Laterality:  N/A;  . HERNIA REPAIR Bilateral   . INSERTION OF MESH N/A 07/06/2014   Procedure: INSERTION OF MESH;  Surgeon: Molly Maduro, MD;  Location: [facility];  Service: General;  Laterality: N/A;  . POLYPECTOMY  02/17/2017   Procedure: POLYPECTOMY;  Surgeon: Lin Landsman, MD;   Location: [place];  Service: Endoscopy;;  . SHOULDER ARTHROSCOPY Left 2008  . SINUS EXPLORATION      Social History   Tobacco Use  . Smoking status: Former Smoker    Packs/day: 0.25    Years: 50.00    Pack years: 12.50    Types: Cigarettes    Quit date: 05/03/2018    Years since quitting: 1.2  . Smokeless tobacco: Former Systems developer  . Tobacco comment: pt quit smoking  Vaping Use  . Vaping Use: Never used  Substance Use Topics  . Alcohol use: Yes    Comment: Occasional -1x/month  . Drug use: No     Medication list has been reviewed and updated.  Current Meds  Medication Sig  . tadalafil (CIALIS) 5 MG tablet Take 5 mg by mouth daily as needed for erectile dysfunction.     PHQ 2/9 Scores 07/26/2019 07/19/2019 06/07/2019 02/06/2019  PHQ - 2 Score 0 0 0 0  PHQ- 9 Score 0 0 0 -    GAD 7 : Generalized Anxiety Score 07/26/2019 07/19/2019 06/07/2019  Nervous, Anxious, on Edge 0 0 1  Control/stop worrying 0 0 0  Worry too much - different things 0 0 0  Trouble relaxing 0 0 0  Restless 0 0 0  Easily annoyed or irritable 0 0 0  Afraid - awful might happen 0 0 0  Total GAD 7 Score 0 0 1  Anxiety Difficulty - Not difficult at all Not difficult at all    BP Readings from Last 3 Encounters:  07/26/19 120/80  07/25/19 138/75  07/19/19 118/66    Physical Exam Vitals and nursing note reviewed.  HENT:     Head: Normocephalic.     Right Ear: Tympanic membrane, ear canal and external ear normal.     Left Ear: Tympanic membrane, ear canal and external ear normal.     Nose: Nose normal. No congestion or rhinorrhea.  Eyes:     General: No scleral icterus.       Right eye: No discharge.        Left eye: No discharge.     Conjunctiva/sclera: Conjunctivae normal.     Pupils: Pupils are equal, round, and reactive to light.  Neck:     Thyroid: No thyromegaly.     Vascular: No JVD.     Trachea: No tracheal deviation.  Cardiovascular:     Rate and Rhythm: Normal rate and  regular rhythm.     Heart sounds: Normal heart sounds. No murmur heard.  No friction rub. No gallop.   Pulmonary:     Effort: No respiratory distress.     Breath sounds: Normal breath sounds. No wheezing, rhonchi or rales.  Abdominal:     General: Bowel sounds are normal.     Palpations: Abdomen is soft. There is no hepatomegaly, splenomegaly or mass.     Tenderness: There is no abdominal tenderness. There is no guarding or rebound.  Musculoskeletal:        General: No tenderness. Normal range of motion.     Cervical back: Normal range of motion and neck supple.  Lymphadenopathy:     Cervical: No cervical adenopathy.  Skin:  General: Skin is warm.     Findings: No rash.  Neurological:     Mental Status: He is alert and oriented to person, place, and time.     Cranial Nerves: No cranial nerve deficit.     Deep Tendon Reflexes: Reflexes are normal and symmetric.     Wt Readings from Last 3 Encounters:  07/26/19 250 lb (113.4 kg)  07/25/19 250 lb (113.4 kg)  07/19/19 252 lb (114.3 kg)    BP 120/80   Pulse 72   Ht 5\' 11"  (1.803 m)   Wt 250 lb (113.4 kg)   BMI 34.87 kg/m   Assessment and Plan: 1. Macrocytic anemia Patient was noted to be anemic with hemoglobin and hematocrit decreased with indices that were macrocytic.  We did ferritin because of the possibility of a mixed reason and it was noted to be in normal range.  Patient was concerned whether or not this this is a real and we will repeat the CBC and we reviewed the levels of B74 and folic acid lab values.  Although this does not fit an acute loss of blood or chronic loss of blood we will check Hemoccults x3.  Likely we will as our next step consult hematology for evaluation of the anemia with regards also approach to alleviating the decreased hemoglobin. - CBC with Differential/Platelet

## 2019-07-27 ENCOUNTER — Other Ambulatory Visit: Payer: Self-pay

## 2019-07-27 DIAGNOSIS — D539 Nutritional anemia, unspecified: Secondary | ICD-10-CM

## 2019-07-27 LAB — CBC WITH DIFFERENTIAL/PLATELET
Basophils Absolute: 0 10*3/uL (ref 0.0–0.2)
Basos: 1 %
EOS (ABSOLUTE): 0.2 10*3/uL (ref 0.0–0.4)
Eos: 3 %
Hematocrit: 28.5 % — ABNORMAL LOW (ref 37.5–51.0)
Hemoglobin: 9.8 g/dL — ABNORMAL LOW (ref 13.0–17.7)
Immature Grans (Abs): 0 10*3/uL (ref 0.0–0.1)
Immature Granulocytes: 0 %
Lymphocytes Absolute: 1.4 10*3/uL (ref 0.7–3.1)
Lymphs: 20 %
MCH: 35.1 pg — ABNORMAL HIGH (ref 26.6–33.0)
MCHC: 34.4 g/dL (ref 31.5–35.7)
MCV: 102 fL — ABNORMAL HIGH (ref 79–97)
Monocytes Absolute: 0.7 10*3/uL (ref 0.1–0.9)
Monocytes: 9 %
Neutrophils Absolute: 4.8 10*3/uL (ref 1.4–7.0)
Neutrophils: 67 %
Platelets: 324 10*3/uL (ref 150–450)
RBC: 2.79 x10E6/uL — ABNORMAL LOW (ref 4.14–5.80)
RDW: 15.5 % — ABNORMAL HIGH (ref 11.6–15.4)
WBC: 7.2 10*3/uL (ref 3.4–10.8)

## 2019-07-27 NOTE — Progress Notes (Unsigned)
Ref to hematology placed

## 2019-07-28 ENCOUNTER — Telehealth: Payer: Self-pay | Admitting: Family Medicine

## 2019-07-28 NOTE — Telephone Encounter (Signed)
Noted  

## 2019-07-28 NOTE — Telephone Encounter (Signed)
Patient returned call and he was read Dr Ronnald Ramp lab note written 07/27/19. He will call back if he has not heard from referral office by Tuesday next week. He verbalized understanding of all information.

## 2019-08-01 ENCOUNTER — Other Ambulatory Visit (INDEPENDENT_AMBULATORY_CARE_PROVIDER_SITE_OTHER): Payer: Medicare Other

## 2019-08-01 ENCOUNTER — Telehealth: Payer: Self-pay | Admitting: *Deleted

## 2019-08-01 ENCOUNTER — Ambulatory Visit
Admission: RE | Admit: 2019-08-01 | Discharge: 2019-08-01 | Disposition: A | Discharge status: Home / Self Care | Payer: Medicare Other | Source: Ambulatory Visit | Attending: Oncology | Admitting: Oncology

## 2019-08-01 ENCOUNTER — Other Ambulatory Visit: Payer: Self-pay

## 2019-08-01 DIAGNOSIS — Z122 Encounter for screening for malignant neoplasm of respiratory organs: Secondary | ICD-10-CM | POA: Insufficient documentation

## 2019-08-01 DIAGNOSIS — Z87891 Personal history of nicotine dependence: Secondary | ICD-10-CM | POA: Insufficient documentation

## 2019-08-01 DIAGNOSIS — D539 Nutritional anemia, unspecified: Secondary | ICD-10-CM | POA: Diagnosis not present

## 2019-08-01 DIAGNOSIS — Z1211 Encounter for screening for malignant neoplasm of colon: Secondary | ICD-10-CM

## 2019-08-01 LAB — HEMOCCULT GUIAC POC 1CARD (OFFICE)
Card #2 Fecal Occult Blod, POC: POSITIVE
Card #3 Fecal Occult Blood, POC: NEGATIVE
Fecal Occult Blood, POC: POSITIVE — AB

## 2019-08-01 IMAGING — CT CT CHEST LUNG CANCER SCREENING LOW DOSE W/O CM
2 of 5 series · 15 of 40 positions shown, 18 images · non-contrast
Comparison: [DATE] screening chest CT.

CLINICAL DATA: 73-year-old asymptomatic male former smoker with 30
pack-year smoking history, quit smoking [DATE].

EXAM:
CT CHEST WITHOUT CONTRAST LOW-DOSE FOR LUNG CANCER SCREENING
TECHNIQUE: Multidetector CT imaging of the chest was performed following the
standard protocol without IV contrast.

[Series 3: lung 1.00 · axial · 0.81mm/px · z∈[-1265,-945]mm · 12 of 356 slices shown, 15 images]
[im 18/356  mediastinal]
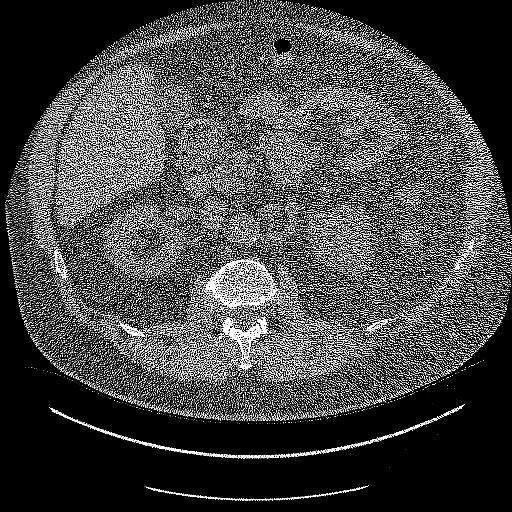
[im 18/356  lung]
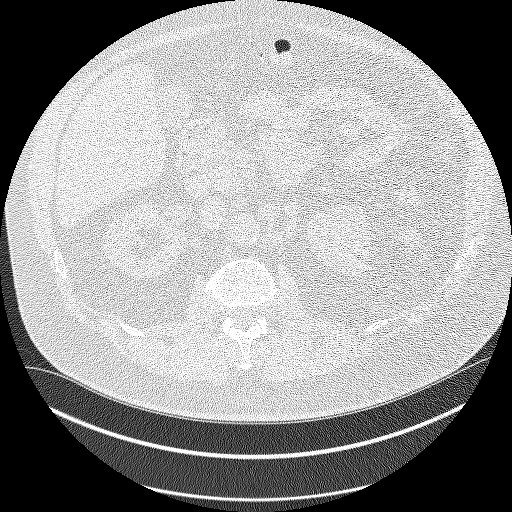
[im 54/356  lung]
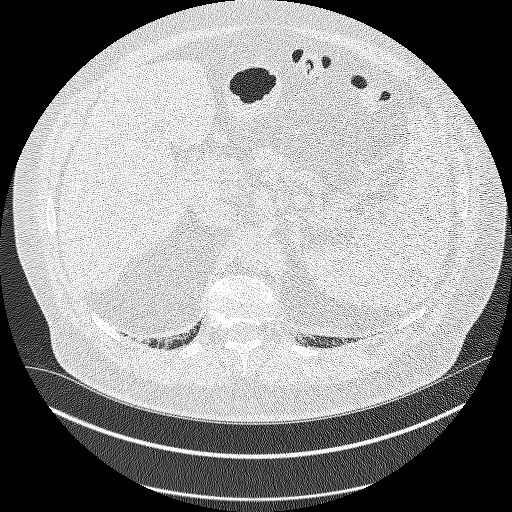
[im 72/356  lung]
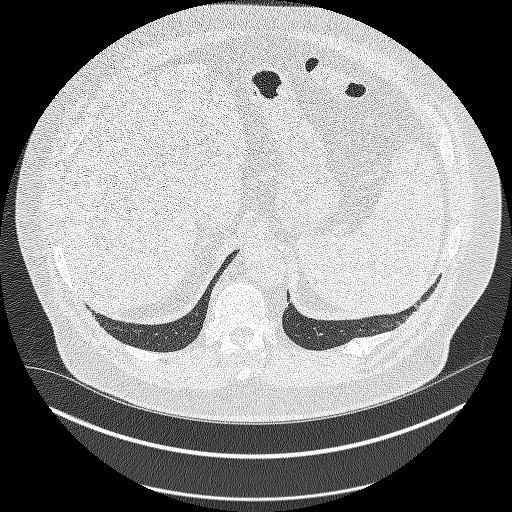
[im 107/356  lung]
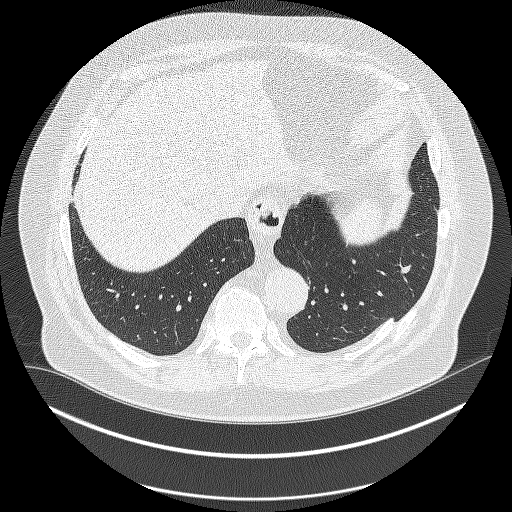
[im 143/356  mediastinal]
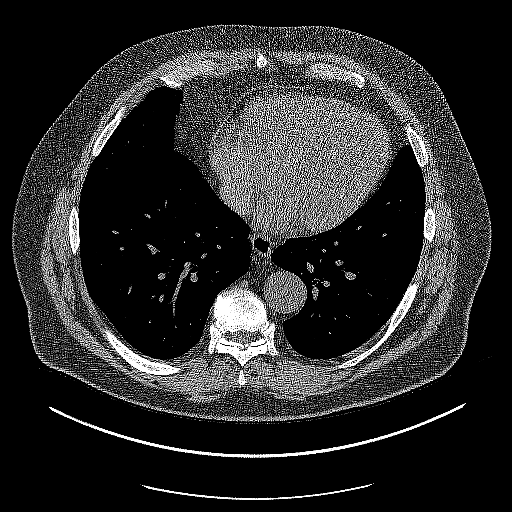
[im 143/356  lung]
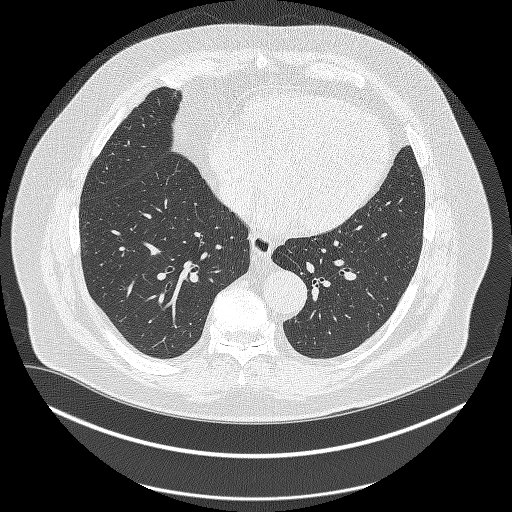
[im 160/356  lung]
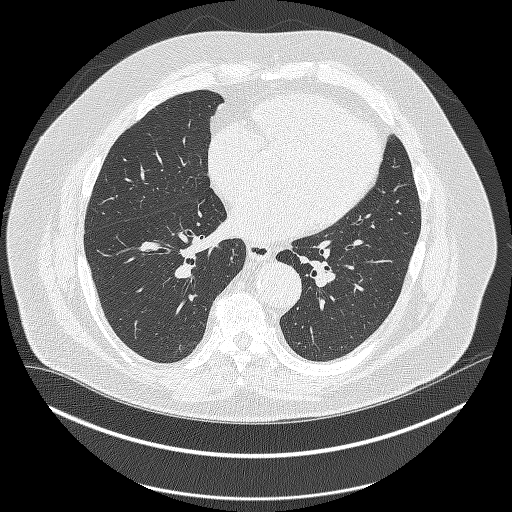
[im 196/356  lung]
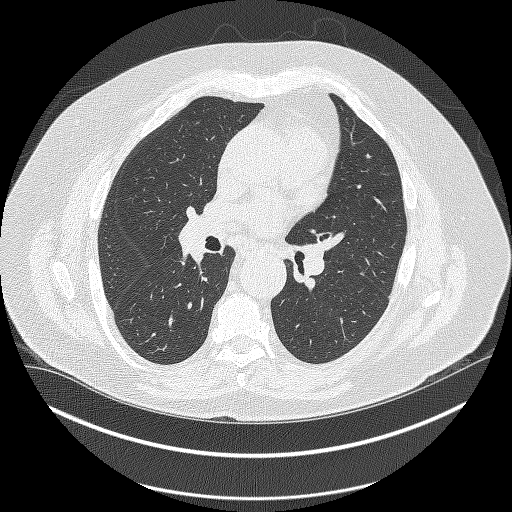
[im 214/356  lung]
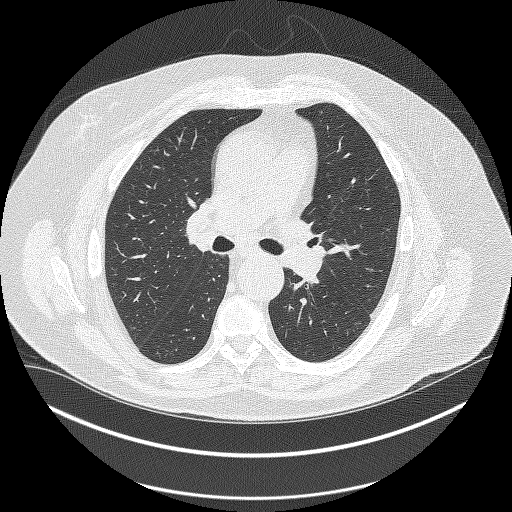
[im 249/356  mediastinal]
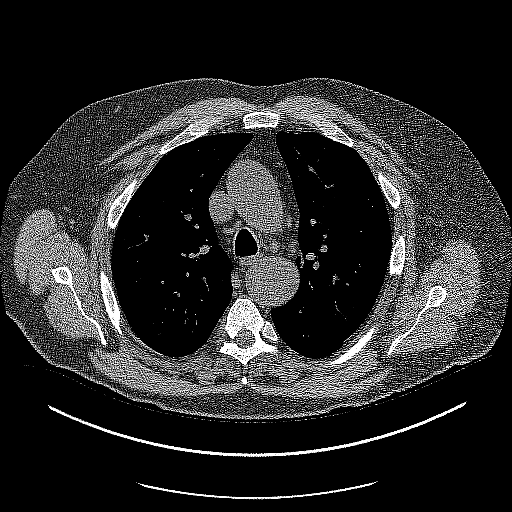
[im 249/356  lung]
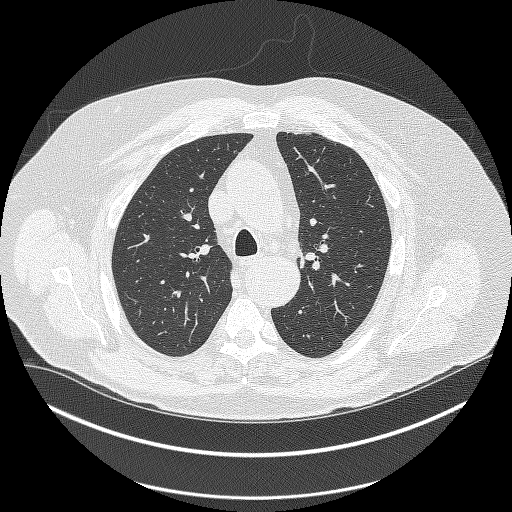
[im 285/356  lung]
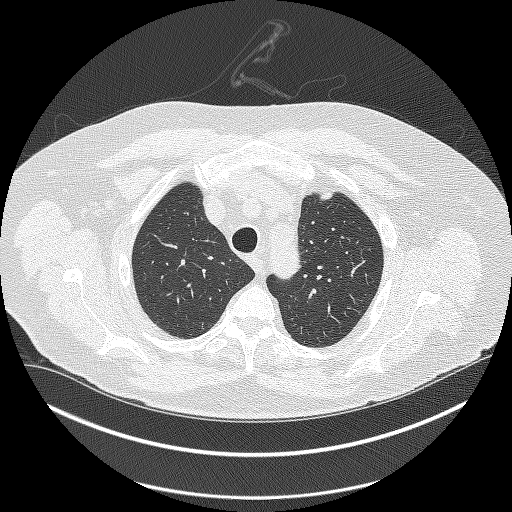
[im 302/356  lung]
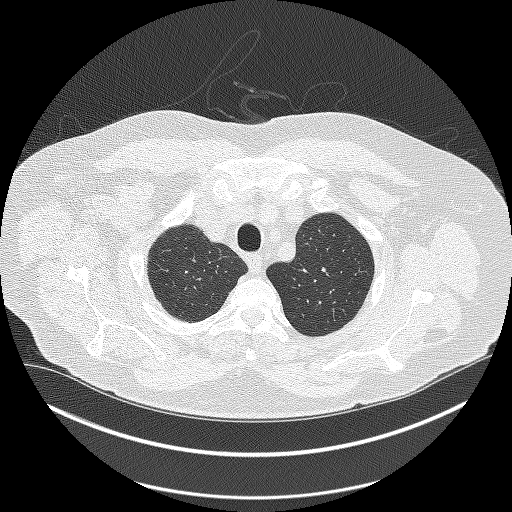
[im 338/356  lung]
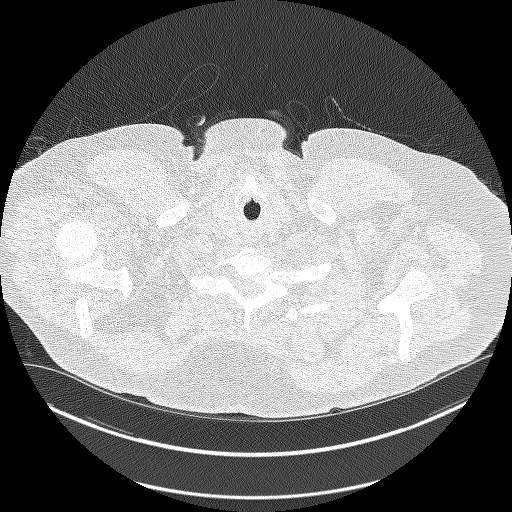

[Series 4: coronals lung 1.00 cor · coronal · 0.70mm/px · 3 of 378 slices shown]
[im 76/378  lung]
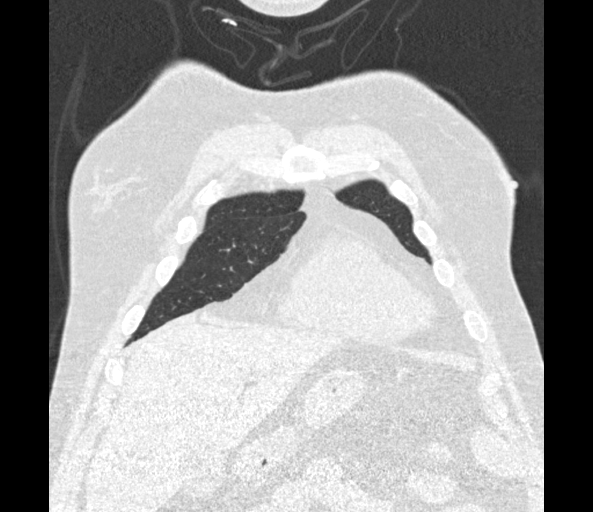
[im 151/378  lung]
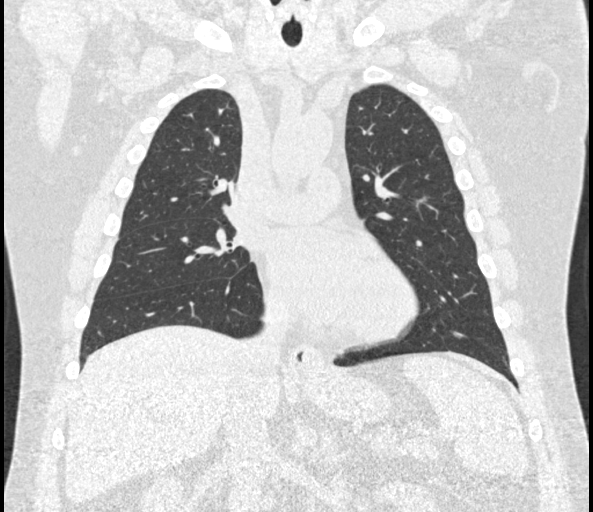
[im 227/378  lung]
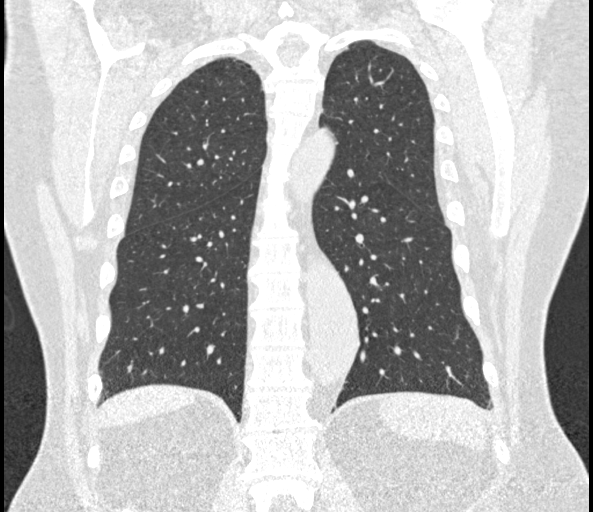

[15 of 40 positions shown; findings below may reference images not displayed]

FINDINGS: Cardiovascular: Normal heart size. No significant pericardial
effusion/thickening. Atherosclerotic thoracic aorta with stable
ectatic 4.1 cm ascending thoracic aorta. Normal caliber pulmonary
arteries.

Mediastinum/Nodes: No discrete thyroid nodules. Unremarkable
esophagus. No pathologically enlarged axillary, mediastinal or hilar
lymph nodes, noting limited sensitivity for the detection of hilar
adenopathy on this noncontrast study.

Lungs/Pleura: No pneumothorax. No pleural effusion. Mild
centrilobular emphysema. No acute consolidative airspace disease or
lung masses. No significant growth of previously visualized
scattered pulmonary nodules. New basilar left lower lobe pulmonary
nodule measuring 4.5 mm in volume derived mean diameter (series
3/image 274). No additional new significant pulmonary nodules.

Upper abdomen: Small hiatal hernia.

Musculoskeletal: No aggressive appearing focal osseous lesions.
Chronic nonunion of posterior left tenth and eleventh rib fractures.
Mild thoracic spondylosis. Moderate bilateral gynecomastia, slightly
asymmetric to the right, unchanged.
IMPRESSION: 1. Lung-RADS 3, probably benign findings. New 4.5 mm basilar left
lower lobe pulmonary nodule. Short-term follow-up in 6 months is
recommended with repeat low-dose chest CT without contrast (please
use the following order, "CT CHEST LCS NODULE FOLLOW-UP W/O CM").
2. Stable ectatic 4.1 cm ascending thoracic aorta, which can be
reassessed on follow-up screening chest CT in 12 months.
3. Small hiatal hernia.
4. Aortic Atherosclerosis ([Q7]-[Q7]) and Emphysema ([Q7]-[Q7]).

These results will be called to the ordering clinician or
representative by the Radiologist Assistant, and communication
documented in the PACS or [REDACTED].

## 2019-08-01 NOTE — Progress Notes (Signed)
2 out of 3 positive guaiac and referral placed to [facility] for endoscopy

## 2019-08-01 NOTE — Telephone Encounter (Signed)
Called report  IMPRESSION: 1. Lung-RADS 3, probably benign findings. New 4.5 mm basilar left lower lobe pulmonary nodule. Short-term follow-up in 6 months is recommended with repeat low-dose chest CT without contrast (please use the following order, "CT CHEST LCS NODULE FOLLOW-UP W/O CM"). 2. 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 3. Small hiatal hernia. 4. Aortic Atherosclerosis (ICD10-I70.0) and Emphysema (ICD10-J43.9).  These results will be called to the ordering clinician or representative by the Radiologist Assistant, and communication documented in the PACS or [employer].   Electronically Signed   By: Ilona Sorrel M.D.   On: 08/01/2019 15:21

## 2019-08-02 ENCOUNTER — Telehealth: Payer: Self-pay | Admitting: Family Medicine

## 2019-08-02 NOTE — Telephone Encounter (Signed)
Patient states that in order for colonoscopy needs to be marked urgent in order for him to have it before September. He is requesting call back from [place].

## 2019-08-03 ENCOUNTER — Telehealth: Payer: Self-pay | Admitting: *Deleted

## 2019-08-03 NOTE — Telephone Encounter (Signed)
Notified patient of LDCT lung cancer screening program results with recommendation for 6 month follow up imaging. Also notified of incidental findings noted below and is encouraged to discuss further with PCP who will receive a copy of this note and/or the CT report. Patient verbalizes understanding.   IMPRESSION: 1. Lung-RADS 3, probably benign findings. New 4.5 mm basilar left lower lobe pulmonary nodule. Short-term follow-up in 6 months is recommended with repeat low-dose chest CT without contrast (please use the following order, "CT CHEST LCS NODULE FOLLOW-UP W/O CM"). 2. 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 3. Small hiatal hernia. 4. Aortic Atherosclerosis (ICD10-I70.0) and Emphysema (ICD10-J43.9).  These results will be called to the ordering clinician or representative by the Radiologist Assistant, and communication documented in the PACS or [employer].

## 2019-08-03 NOTE — Telephone Encounter (Signed)
Pt is aware of tara message

## 2019-08-03 NOTE — Telephone Encounter (Signed)
That is the first available

## 2019-08-14 NOTE — Progress Notes (Signed)
[facility]  [address] [place], [place] [address] Phone: [phone]  Fax: [phone]   Clinic Day:  08/15/2019  Referring physician: [name], MD  Chief Complaint: Brett Boone is a 74 y.o. male with a macrocytic anemia who is referred in consultation by Dr. Otilio Miu for assessment and management.   HPI: The patient was diagnosed with macrocytic anemia by Dr. Ronnald Ramp. He complained of weight loss.  He denied any abdominal pain, anorexia, bruising/bleeding easily, confusion, fever, leg swelling, light-headedness, malaise/fatigue, pallor, palpitations, paresthesias or pica.  Creatinine was 1.03, calcium 9.1, albumin 4.1 with normal liver function tests.  Ferritin was 1020. B12 was 1083 and folate 6.9.  CBC has been followed: 07/19/2019:  Hematocrit 27.3, hemoglobin 9.5, MCV 102, platelets 297,000, WBC 4900 with an ANC 3100. 07/26/2019:  Hematocrit 28.5, hemoglobin 9.8, MCV 102, platelets 324,000, WBC 7200 with an ANC 4800.  Fecal occult blood was positive on 08/01/2019.   Colonoscopy  on 02/17/2017 with Dr. Marius Ditch revealing one 4 mm polyp in the cecum, one 12 mm polyp in the cecum, one 5 mm polyp in the cecum, two 8 to 10 mm polyps in the transverse colon, one 5 mm polyp in the descending colon, and one 8 mm polyp in the rectum. The distal rectum and anal verge are normal on retroflexion view. Repeat colonoscopy was recommended in 3 years.   He has a smoking history.  He quit smoking on 05/03/2018.  Lung Cancer Screening chest CT on 08/01/2019 revealed Lung-RADS 3, probably benign findings. 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  Symptomatically, he feels well overall. He notes that he does not regularly get blood counts with his doctor. He had some weight loss noted, but he states that he had gained about 20 lbs, which he attributed to COVID-19. He started  to work out some and eat more healthy, which he subsequently lost about 20 lbs.    Past Medical History:  Diagnosis Date  . Back injury   . Heart murmur   . Venous stasis dermatitis of both lower extremities     Past Surgical History:  Procedure Laterality Date  . CATARACT EXTRACTION W/PHACO Right 03/09/2017   Procedure: CATARACT EXTRACTION PHACO AND INTRAOCULAR LENS PLACEMENT ([place]) RIGHT;  Surgeon: Eulogio Bear, MD;  Location: [place];  Service: Ophthalmology;  Laterality: Right;  . COLONOSCOPY  2013   normal- cleared for 5 yrs- Dr Beckey Downing  . COLONOSCOPY WITH PROPOFOL N/A 02/17/2017   Procedure: COLONOSCOPY WITH PROPOFOL;  Surgeon: Lin Landsman, MD;  Location: [place];  Service: Endoscopy;  Laterality: N/A;  . EPIGASTRIC HERNIA REPAIR N/A 07/06/2014   Procedure: HERNIA REPAIR EPIGASTRIC ADULT;  Surgeon: Molly Maduro, MD;  Location: [facility];  Service: General;  Laterality: N/A;  . HERNIA REPAIR Bilateral   . INSERTION OF MESH N/A 07/06/2014   Procedure: INSERTION OF MESH;  Surgeon: Molly Maduro, MD;  Location: [facility];  Service: General;  Laterality: N/A;  . POLYPECTOMY  02/17/2017   Procedure: POLYPECTOMY;  Surgeon: Lin Landsman, MD;  Location: [place];  Service: Endoscopy;;  . SHOULDER ARTHROSCOPY Left 2008  . SINUS EXPLORATION      Family History  Problem Relation Age of Onset  . Diabetes Father   . Heart disease Father 93  . Stroke Mother     Social History:  reports that  he quit smoking about 15 months ago. His smoking use included cigarettes. He has a 12.50 pack-year smoking history. He has quit using smokeless tobacco. He reports current alcohol use. He reports that he does not use drugs. 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 The patient is alone today.  Allergies:  Allergies  Allergen Reactions  . Amoxicillin-Pot Clavulanate Hives and Nausea And Vomiting     Current Medications: Current Outpatient Medications  Medication Sig Dispense Refill  . tadalafil (CIALIS) 5 MG tablet Take 5 mg by mouth daily as needed for erectile dysfunction.      No current facility-administered medications for this visit.    Review of Systems  Constitutional: Positive for weight loss (intentional diet induced). Negative for chills, diaphoresis, fever and malaise/fatigue.  HENT: Positive for congestion. Negative for sore throat.   Eyes: Negative.  Negative for blurred vision and double vision.  Respiratory: Negative.  Negative for cough and shortness of breath.   Cardiovascular: Negative.  Negative for chest pain, palpitations and leg swelling.  Gastrointestinal: Negative.  Negative for blood in stool, constipation, diarrhea, heartburn, melena, nausea and vomiting.  Genitourinary: Negative.  Negative for dysuria, frequency, hematuria and urgency.  Musculoskeletal: Negative.  Negative for back pain, falls and myalgias.       Joint stiffness.  Skin: Negative.  Negative for rash.  Neurological: Negative.  Negative for dizziness, sensory change, weakness and headaches.  Endo/Heme/Allergies: Negative.  Does not bruise/bleed easily.  Psychiatric/Behavioral: Negative.  Negative for depression. The patient is not nervous/anxious.    Performance status (ECOG): 0  Vitals Blood pressure 131/68, pulse 66, temperature 97.8 F (36.6 C), temperature source Tympanic, resp. rate 18, height 5\' 11"  (1.803 m), weight 257 lb 6.2 oz (116.7 kg), SpO2 100 %.   Physical Exam Vitals and nursing note reviewed.  Constitutional:      General: He is not in acute distress.    Appearance: Normal appearance. He is not ill-appearing or diaphoretic.  HENT:     Head: Normocephalic and atraumatic.     Comments: Gray hair.    Nose: Nose normal.     Mouth/Throat:     Mouth: Mucous membranes are dry.     Pharynx: Oropharynx is clear.  Eyes:     General: No scleral icterus.     Conjunctiva/sclera: Conjunctivae normal.     Pupils: Pupils are equal, round, and reactive to light.  Cardiovascular:     Rate and Rhythm: Normal rate and regular rhythm.     Pulses: Normal pulses.     Heart sounds: Normal heart sounds. No murmur heard.  No friction rub. No gallop.   Pulmonary:     Effort: No respiratory distress.     Breath sounds: Normal breath sounds. No stridor. No wheezing or rhonchi.  Abdominal:     General: Bowel sounds are normal.     Palpations: Abdomen is soft. There is no hepatomegaly or splenomegaly.  Musculoskeletal:        General: No swelling. Normal range of motion.     Cervical back: Normal range of motion and neck supple.     Right lower leg: No edema.     Left lower leg: No edema.  Lymphadenopathy:     Head:     Right side of head: No preauricular, posterior auricular or occipital adenopathy.     Left side of head: No preauricular, posterior auricular or occipital adenopathy.     Cervical:  No cervical adenopathy.     Upper Body:     Right upper body: No supraclavicular or axillary adenopathy.     Left upper body: No supraclavicular or axillary adenopathy.     Lower Body: No right inguinal adenopathy. No left inguinal adenopathy.  Skin:    General: Skin is warm and dry.     Coloration: Skin is not jaundiced or pale.     Findings: No bruising or erythema.     Comments: Brawny lower extremities.  Neurological:     General: No focal deficit present.     Mental Status: He is alert and oriented to person, place, and time. Mental status is at baseline.  Psychiatric:        Mood and Affect: Mood normal.        Behavior: Behavior normal.        Thought Content: Thought content normal.        Judgment: Judgment normal.    No visits with results within 3 Day(s) from this visit.  Latest known visit with results is:  Lab on 08/01/2019  Component Date Value Ref Range Status  . Fecal Occult Blood, POC 08/01/2019 Positive* Negative Final  . Card #2  Fecal Occult Blod, POC 08/01/2019 Positive   Final  . Card #3 Fecal Occult Blood, POC 08/01/2019 Negative   Final    Assessment:  Brett Boone is a 74 y.o. male a macrocytic anemia.  He denies any underlying liver disease.  He drinks socially.  CBC on 07/26/2019 revealed a hematocrit 28.5, hemoglobin 9.8, MCV 102, platelets 324,000, WBC 7200 with an ANC 4800.  Labs on 07/19/2019 included a ferritin of 1020, B12 of 1083 and folate 6.9.  Fecal occult blood was positive on 08/01/2019.   He has a smoking history.  Lung cancer screening chest CT on 08/01/2019 revealed Lung-RADS 3, probably benign findings. There was a new 4.5 mm basilar left lower lobe pulmonary nodule and a sable ectatic 4.1 cm ascending thoracic  Symptomatically, he denies any fevers, sweats or weight loss.  He denies any excess bruising or bleeding.  Exam is unremarkable.  Plan: 1.   Labs today:  CBC with diff, sed rate, CRP, TSH, free T4, retic. 2.   Peripheral smear for pathologic review.   3.   Macrocytic anemia  Discuss differential diagnosis of macrocytosis: B12 deficiency, folate deficiency, liver disease, hypothyroidism, myelodysplastic syndrome, medications.  No medications implicated.  B12 and folate are normal.  He has no known liver disease.  Additional labs drawn today.  Discuss possible bone marrow to r/o a myelodysplastic syndrome (MDS). 4.   Elevated ferritin  Ferritin was 1021 on 07/19/2019.  Repeat ferritin and check iron saturation.   Discuss consideration of hemochromatosis testing if elevated iron saturation. 5.   RTC in 1 week for MD assessment, review of work-up and discussion regarding direction of therapy.  I discussed the assessment and treatment plan with the patient.  The patient was provided an opportunity to ask questions and all were answered.  The patient agreed with the plan and demonstrated an understanding of the instructions.  The patient was advised to call back if the symptoms  worsen or if the condition fails to improve as anticipated.   Brett Boone C. Mike Gip, MD, PhD    08/15/2019, 2:32 PM  I, Jacqualyn Posey, am acting as Education administrator for [employer]. Mike Gip, MD, PhD.  I, Isaac Lacson C. Mike Gip, MD, have reviewed the above documentation for accuracy and completeness, and  I agree with the above.

## 2019-08-15 ENCOUNTER — Inpatient Hospital Stay: Payer: Medicare Other

## 2019-08-15 ENCOUNTER — Other Ambulatory Visit: Payer: Self-pay

## 2019-08-15 ENCOUNTER — Inpatient Hospital Stay: Payer: Medicare Other | Attending: Hematology and Oncology | Admitting: Hematology and Oncology

## 2019-08-15 ENCOUNTER — Encounter: Payer: Self-pay | Admitting: Hematology and Oncology

## 2019-08-15 VITALS — BP 131/68 | HR 66 | Temp 97.8°F | Resp 18 | Ht 71.0 in | Wt 257.4 lb

## 2019-08-15 DIAGNOSIS — R7989 Other specified abnormal findings of blood chemistry: Secondary | ICD-10-CM

## 2019-08-15 DIAGNOSIS — R911 Solitary pulmonary nodule: Secondary | ICD-10-CM

## 2019-08-15 DIAGNOSIS — F17210 Nicotine dependence, cigarettes, uncomplicated: Secondary | ICD-10-CM

## 2019-08-15 DIAGNOSIS — Z7289 Other problems related to lifestyle: Secondary | ICD-10-CM | POA: Diagnosis not present

## 2019-08-15 DIAGNOSIS — D539 Nutritional anemia, unspecified: Secondary | ICD-10-CM

## 2019-08-15 DIAGNOSIS — R634 Abnormal weight loss: Secondary | ICD-10-CM | POA: Insufficient documentation

## 2019-08-15 LAB — RETICULOCYTES
Immature Retic Fract: 21 % — ABNORMAL HIGH (ref 2.3–15.9)
RBC.: 2.63 MIL/uL — ABNORMAL LOW (ref 4.22–5.81)
Retic Count, Absolute: 83.9 10*3/uL (ref 19.0–186.0)
Retic Ct Pct: 3.2 % — ABNORMAL HIGH (ref 0.4–3.1)

## 2019-08-15 LAB — CBC WITH DIFFERENTIAL/PLATELET
Abs Immature Granulocytes: 0.04 10*3/uL (ref 0.00–0.07)
Basophils Absolute: 0.1 10*3/uL (ref 0.0–0.1)
Basophils Relative: 1 %
Eosinophils Absolute: 0.2 10*3/uL (ref 0.0–0.5)
Eosinophils Relative: 2 %
HCT: 29 % — ABNORMAL LOW (ref 39.0–52.0)
Hemoglobin: 9.3 g/dL — ABNORMAL LOW (ref 13.0–17.0)
Immature Granulocytes: 1 %
Lymphocytes Relative: 23 %
Lymphs Abs: 1.4 10*3/uL (ref 0.7–4.0)
MCH: 35 pg — ABNORMAL HIGH (ref 26.0–34.0)
MCHC: 32.1 g/dL (ref 30.0–36.0)
MCV: 109 fL — ABNORMAL HIGH (ref 80.0–100.0)
Monocytes Absolute: 0.5 10*3/uL (ref 0.1–1.0)
Monocytes Relative: 8 %
Neutro Abs: 4 10*3/uL (ref 1.7–7.7)
Neutrophils Relative %: 65 %
Platelets: 309 10*3/uL (ref 150–400)
RBC: 2.66 MIL/uL — ABNORMAL LOW (ref 4.22–5.81)
RDW: 17.4 % — ABNORMAL HIGH (ref 11.5–15.5)
WBC: 6.2 10*3/uL (ref 4.0–10.5)
nRBC: 0.6 % — ABNORMAL HIGH (ref 0.0–0.2)

## 2019-08-15 LAB — TSH: TSH: 1.275 u[IU]/mL (ref 0.350–4.500)

## 2019-08-15 LAB — IRON AND TIBC
Iron: 97 ug/dL (ref 45–182)
Saturation Ratios: 40 % — ABNORMAL HIGH (ref 17.9–39.5)
TIBC: 241 ug/dL — ABNORMAL LOW (ref 250–450)
UIBC: 144 ug/dL

## 2019-08-15 LAB — T4, FREE: Free T4: 0.85 ng/dL (ref 0.61–1.12)

## 2019-08-15 LAB — FERRITIN: Ferritin: 641 ng/mL — ABNORMAL HIGH (ref 24–336)

## 2019-08-15 LAB — C-REACTIVE PROTEIN: CRP: 0.6 mg/dL (ref <1.0)

## 2019-08-15 LAB — SEDIMENTATION RATE: Sed Rate: 24 mm/hr — ABNORMAL HIGH (ref 0–20)

## 2019-08-15 NOTE — Patient Instructions (Addendum)
He has macrocytosis. We are going to do some simple studies to rule out hemachromatosis. If we don't get an answer as to why he has macrocytosis, we will do a bone marrow biopsy to evaluate for myelodysplastic syndromes.

## 2019-08-16 LAB — PATHOLOGIST SMEAR REVIEW

## 2019-08-21 NOTE — Progress Notes (Signed)
[facility]  [address] [place], [place] [address] Phone: [phone]  Fax: [phone]   Clinic Day:  08/22/2019  Referring physician: [name], MD  Chief Complaint: [name] is a 74 y.o. male with a macrocytic anemia who is seen for review of work-up and discussion regarding direction of therapy.  HPI: The patient was last seen in the hematology clinic on 08/15/2019 for initial consultation. Symptomatically, he denied any fevers, sweats or weight loss.  He denied any excess bruising or bleeding.  Exam revealed no adenopathy or hepatosplenomegaly.  Work-up included a  hematocrit of 29.0, hemoglobin 9.3, MCV 109.0, platelets 309,000, WBC 6,200. Ferritin was 641 with an iron saturation of 40% and a TIBC of 241. Reticulocyte count was 3.2%. TSH was 1.275 and free T4 was 0.85. CRP was 0.6 and sed rate 24.  Pathologist smear revealed macrocytic anemia. The morphology of WBCs and platelets were within normal limits. There was no evidence of circulating blasts or schistocytes.   During the interim, the patient has been doing well. He declines any symptoms or complaints at this time. He notes that he is going to be out of town from 08/29/2019 - 09/03/2019.   Past Medical History:  Diagnosis Date  . Back injury   . Heart murmur   . Venous stasis dermatitis of both lower extremities     Past Surgical History:  Procedure Laterality Date  . CATARACT EXTRACTION W/PHACO Right 03/09/2017   Procedure: CATARACT EXTRACTION PHACO AND INTRAOCULAR LENS PLACEMENT ([place]) RIGHT;  Surgeon: Eulogio Bear, MD;  Location: [facility];  Service: Ophthalmology;  Laterality: Right;  . COLONOSCOPY  2013   normal- cleared for 5 yrs- Dr Beckey Downing  . COLONOSCOPY WITH PROPOFOL N/A 02/17/2017   Procedure: COLONOSCOPY WITH PROPOFOL;  Surgeon: Lin Landsman, MD;  Location: [facility];  Service: Endoscopy;  Laterality: N/A;  .  EPIGASTRIC HERNIA REPAIR N/A 07/06/2014   Procedure: HERNIA REPAIR EPIGASTRIC ADULT;  Surgeon: Molly Maduro, MD;  Location: [facility];  Service: General;  Laterality: N/A;  . HERNIA REPAIR Bilateral   . INSERTION OF MESH N/A 07/06/2014   Procedure: INSERTION OF MESH;  Surgeon: Molly Maduro, MD;  Location: [facility];  Service: General;  Laterality: N/A;  . POLYPECTOMY  02/17/2017   Procedure: POLYPECTOMY;  Surgeon: Lin Landsman, MD;  Location: [place];  Service: Endoscopy;;  . SHOULDER ARTHROSCOPY Left 2008  . SINUS EXPLORATION      Family History  Problem Relation Age of Onset  . Diabetes Father   . Heart disease Father 71  . Stroke Mother     Social History:  reports that he quit smoking about 15 months ago. His smoking use included cigarettes. He has a 12.50 pack-year smoking history. He has quit using smokeless tobacco. He reports current alcohol use. He reports that he does not use drugs. 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 The patient is accompanied by his wife, Jackelyn Poling, via iPad today.  Allergies:  Allergies  Allergen Reactions  . Amoxicillin-Pot Clavulanate Hives and Nausea And Vomiting    Current Medications: Current Outpatient Medications  Medication Sig Dispense Refill  . tadalafil (CIALIS) 5 MG tablet Take 5 mg by mouth daily as needed for erectile dysfunction.      No current facility-administered medications for this visit.    Review of Systems  Constitutional: Positive for weight loss (4 lbs).  Negative for chills, diaphoresis, fever and malaise/fatigue.  HENT: Negative for congestion, ear discharge, ear pain, hearing loss, nosebleeds, sinus pain, sore throat and tinnitus.   Eyes: Negative for blurred vision.  Respiratory: Negative for cough, hemoptysis, sputum production and shortness of breath.   Cardiovascular: Negative for chest pain, palpitations and leg swelling.  Gastrointestinal: Negative  for abdominal pain, blood in stool, constipation, diarrhea, heartburn, melena, nausea and vomiting.  Genitourinary: Negative for dysuria, frequency, hematuria and urgency.  Musculoskeletal: Negative for back pain, joint pain, myalgias and neck pain.  Skin: Negative for itching and rash.  Neurological: Negative for dizziness, tingling, sensory change, weakness and headaches.  Endo/Heme/Allergies: Does not bruise/bleed easily.  Psychiatric/Behavioral: Negative for depression and memory loss. The patient is not nervous/anxious and does not have insomnia.   All other systems reviewed and are negative.  Performance status (ECOG): 0 - Asymptomatic  Vitals Blood pressure 125/67, pulse 68, temperature (!) 96.7 F (35.9 C), temperature source Tympanic, resp. rate 18, weight (!) 253 lb 8.5 oz (115 kg), SpO2 100 %.   Physical Exam Vitals and nursing note reviewed.  Constitutional:      General: He is not in acute distress.    Appearance: He is not diaphoretic.  HENT:     Head:     Comments: Gray hair. Eyes:     General: No scleral icterus.    Conjunctiva/sclera: Conjunctivae normal.  Neurological:     Mental Status: He is alert and oriented to person, place, and time. Mental status is at baseline.  Psychiatric:        Mood and Affect: Mood normal.        Behavior: Behavior normal.        Thought Content: Thought content normal.        Judgment: Judgment normal.    No visits with results within 3 Day(s) from this visit.  Latest known visit with results is:  Orders Only on 08/15/2019  Component Date Value Ref Range Status  . Retic Ct Pct 08/15/2019 3.2* 0.4 - 3.1 % Final  . RBC. 08/15/2019 2.63* 4.22 - 5.81 MIL/uL Final  . Retic Count, Absolute 08/15/2019 83.9  19.0 - 186.0 K/uL Final  . Immature Retic Fract 08/15/2019 21.0* 2.3 - 15.9 % Final   Performed at [facility], [address], [place], [place] [address]    Assessment:  Brett Boone is a 74 y.o. male a  macrocytic anemia.  He denies any underlying liver disease.  He drinks socially.  CBC on 07/26/2019 revealed a hematocrit 28.5, hemoglobin 9.8, MCV 102, platelets 324,000, WBC 7200 with an ANC 4800.  Labs on 07/19/2019 included a ferritin of 1020, B12 of 1083 and folate 6.9.  Fecal occult blood was positive on 08/01/2019.   Work-up on 08/15/2019 revealed a  hematocrit of 29.0, hemoglobin 9.3, MCV 109.0, platelets 309,000, WBC 6,200. Ferritin was 641 with an iron saturation of 40% and a TIBC of 241. Reticulocyte count was 3.2%. TSH was 1.275 and free T4 was 0.85.  CRP was 0.6 and sed rate 24.  Ferritin has been followed: 1020 on 07/19/2019 and 641 on 08/15/2019.  He has a smoking history.  Lung cancer screening chest CT on 08/01/2019 revealed Lung-RADS 3, probably benign findings. There was a new 4.5 mm basilar left lower lobe pulmonary nodule and a sable ectatic 4.1 cm ascending thoracic  Symptomatically, he is doing well.  He denies any concerns.  Plan: 1.   Labs today:  hemochromatosis  testing. 2.   Macrocytic anemia             Etiology is unclear from work-up.  No evidence of B12 or folate deficiency, liver disease, or hypothyroidism.  No medications implicated.               Discuss possible myelodysplastic syndrome.  Discuss management.  Discuss plans for bone marrow aspirate and biopsy.   Procedure discussed in detail.  Risks and benefits reviewed.   Patient agreeable to bone marrow after his upcoming trip.  Schedule bone marrow aspirate and biopsy. 3.   Elevated ferritin             Ferritin was 1021 on 07/19/2019 and 641 on 08/15/2019. Sed rate and CRP low.             Iron saturation is 40% (high).                   Doubt hemochromatosis as ferritin is spontaneously declining.  Patient requests hemochomatosis testing. 4.   Bone marrow aspirate and biopsy 09/06/2019. 5.   RTC 10 days after bone marrow for MD assessment and review of bone marrow.  I discussed the assessment and  treatment plan with the patient.  The patient was provided an opportunity to ask questions and all were answered.  The patient agreed with the plan and demonstrated an understanding of the instructions.  The patient was advised to call back if the symptoms worsen or if the condition fails to improve as anticipated.  I provided 23 minutes of face-to-face time during this this encounter and > 50% was spent counseling as documented under my assessment and plan.    Masae Lukacs C. Mike Gip, MD, PhD    08/22/2019, 3:51 PM  I, Mirian Mo Tufford, am acting as Education administrator for [employer]. Mike Gip, MD, PhD.  I, Ly Wass C. Mike Gip, MD, have reviewed the above documentation for accuracy and completeness, and I agree with the above.

## 2019-08-22 ENCOUNTER — Inpatient Hospital Stay: Payer: Medicare Other | Admitting: Hematology and Oncology

## 2019-08-22 ENCOUNTER — Inpatient Hospital Stay: Payer: Medicare Other

## 2019-08-22 ENCOUNTER — Other Ambulatory Visit: Payer: Self-pay

## 2019-08-22 ENCOUNTER — Encounter: Payer: Self-pay | Admitting: Hematology and Oncology

## 2019-08-22 VITALS — BP 125/67 | HR 68 | Temp 96.7°F | Resp 18 | Wt 253.5 lb

## 2019-08-22 DIAGNOSIS — D539 Nutritional anemia, unspecified: Secondary | ICD-10-CM

## 2019-08-22 DIAGNOSIS — Z7289 Other problems related to lifestyle: Secondary | ICD-10-CM | POA: Diagnosis not present

## 2019-08-22 DIAGNOSIS — R7989 Other specified abnormal findings of blood chemistry: Secondary | ICD-10-CM

## 2019-08-22 DIAGNOSIS — R911 Solitary pulmonary nodule: Secondary | ICD-10-CM | POA: Diagnosis not present

## 2019-08-22 NOTE — Patient Instructions (Signed)
 Myelodysplastic Syndrome Myelodysplastic syndrome (MDS) is a group of cancers that affect blood cells. New blood cells are also called immature cells or stem cells. MDS starts in the bone marrow where new blood cells are made. The bone marrow makes:  Red blood cells. These carry oxygen.  White blood cells. These fight infection.  Platelets. These stop bleeding. With MDS, some immature cells do not grow into adult blood cells. These cells will die before they reach maturity. Immature blood cells build up in the bone marrow and crowd out normal adult blood cells. This causes a low blood count and can affect how many healthy red blood cells, white blood cells, and platelets you have. If you have too few red blood cells, your blood may not have enough oxygen, and you will feel tired. If you do not have enough white blood cells, you might get infections often. If you have too few platelets, you may bruise or bleed easily. What are the causes? This condition may be caused by:  Being exposed to certain medicines or chemicals.  Radiation treatment. In some cases, the cause may not be known. What increases the risk? This condition is more likely to develop in:  Caucasians.  Males.  People who are 60 years of age or older.  People who have been treated with cancer medicines or radiation.  People who have been exposed to tobacco smoke, pesticides, lead, mercury, or the benzene chemical.  People with a family history of MDS.  People who inherit certain abnormal genes that lead to bone marrow problems. What are the signs or symptoms? Symptoms often develop slowly over time. Symptoms depend on which blood cells are affected. Symptoms include:  Being tired all the time.  Feeling out of breath.  Having pale skin.  Getting infections often.  Having pain in the bones.  Feeling weak.  Having a fever often.  Bruising easily.  Bleeding that lasts longer than normal.  Having tiny  spots of bleeding under the skin (petechiae). How is this diagnosed? This condition is diagnosed based on your symptoms, medical history, and physical exam. You may also have tests, including:  A complete blood count. This test counts the number of red, white, and platelet blood cells.  Peripheral blood smear. This test checks the number and type of blood cells. It also checks their size and shape.  Bone marrow aspiration and biopsy. Aspiration involves using a needle to remove the liquid portion of the bone marrow, and a biopsy involves removing the bone marrow tissue. The samples removed will be examined under a microscope. How is this treated? There is no cure for MDS. Treatment focuses on relieving your symptoms and slowing down the production of immature blood cells. The type of treatment that is best for you depends on the type of MDS you have. Treatment may include:  Blood transfusions.  Chemotherapy. This is the use of medicines to kill cancer cells.  Targeted therapy. This treatment targets specific parts of cancer cells and the area around them to block the growth and spread of the cancer.  Immunotherapy or biologic therapy. This treatment helps your body boost its own immune system and natural defenses to stop the growth and spread of cancer cells.  Medicines that: ? Slow down the number of immature blood cells that are made. ? Help immature cells develop into adult cells. ? Make more blood cells. ? Treat infections (antibiotics).  Bone marrow stem cell transplant. In this procedure, your stem cells   will be replaced with healthy stem cells from a donor. Follow these instructions at home: Medicines  Take over-the-counter and prescription medicines only as told by your health care provider.  Do not take aspirin unless your health care provider approves. Aspirin can make bleeding more likely.  If you were prescribed an antibiotic, take it as told by your health care  provider. Do not stop taking the antibiotic even if you start to feel better. Lifestyle   Avoid dangerous activities that could lead to bleeding. Ask your health care provider what activities are safe for you.  Wash all fruits and vegetables before eating them. Make sure meat and eggs are well cooked. Do not eat raw fish and shellfish.  Wash your hands often with soap and water. If soap and water are not available, use hand sanitizer.  Do not use any tobacco products, such as cigarettes, chewing tobacco, and e-cigarettes. If you need help quitting, ask your health care provider. General instructions  Stay away from people who are sick.  Keep all follow-up visits as told by your health care provider. This is important.  Talk to your health care provider before having any medical or dental procedure. Contact a health care provider if:  You feel more tired than normal.  You have more bruising than usual.  You have trouble catching your breath.  You have any symptoms of infection, such as: ? Fever. ? Chills. ? Abnormal urinary symptoms. ? Cough. ? Body aches. ? Headache. ? Weakness. ? Dizziness. Get help right away if:  Your symptoms get worse.  You have bleeding that does not stop.  You have trouble breathing.  You have chest pain. This information is not intended to replace advice given to you by your health care provider. Make sure you discuss any questions you have with your health care provider. Document Revised: 05/06/2018 Document Reviewed: 01/27/2015 Elsevier Patient Education  2020 [employer]  

## 2019-08-25 ENCOUNTER — Telehealth: Payer: Self-pay | Admitting: *Deleted

## 2019-08-25 LAB — HEMOCHROMATOSIS DNA-PCR(C282Y,H63D)

## 2019-08-25 NOTE — Telephone Encounter (Signed)
Patient wife called stating they were to get a call Wednesday with appointment for BMBx and they have not heard from [place] regarding this. They need to know this as they are planning to go out of town

## 2019-08-25 NOTE — Telephone Encounter (Signed)
  Do you know the status of his bone marrow?  M

## 2019-08-28 ENCOUNTER — Telehealth: Payer: Self-pay

## 2019-08-28 NOTE — Telephone Encounter (Signed)
Spoke with the patient to inform him that his bone marrow for 09/11/2019 @ 7:30 for a 8:30 appointment. The patient is aware.

## 2019-08-30 ENCOUNTER — Ambulatory Visit: Payer: Medicare Other

## 2019-09-07 NOTE — Progress Notes (Signed)
Patient on schedule for BMB 09/11/2019, called and spoke with wife with pre procedure instructions given. Made aware to be here @ 0730, NPO after MN, as well as driver for discharge post recovery. Stated understanding.

## 2019-09-08 ENCOUNTER — Other Ambulatory Visit: Payer: Self-pay | Admitting: Student

## 2019-09-11 ENCOUNTER — Other Ambulatory Visit: Payer: Self-pay

## 2019-09-11 ENCOUNTER — Ambulatory Visit
Admission: RE | Admit: 2019-09-11 | Discharge: 2019-09-11 | Disposition: A | Discharge status: Home / Self Care | Payer: Medicare Other | Source: Ambulatory Visit | Attending: Hematology and Oncology | Admitting: Hematology and Oncology

## 2019-09-11 DIAGNOSIS — Z8616 Personal history of COVID-19: Secondary | ICD-10-CM | POA: Insufficient documentation

## 2019-09-11 DIAGNOSIS — Z8249 Family history of ischemic heart disease and other diseases of the circulatory system: Secondary | ICD-10-CM | POA: Insufficient documentation

## 2019-09-11 DIAGNOSIS — D643 Other sideroblastic anemias: Secondary | ICD-10-CM | POA: Insufficient documentation

## 2019-09-11 DIAGNOSIS — I872 Venous insufficiency (chronic) (peripheral): Secondary | ICD-10-CM | POA: Insufficient documentation

## 2019-09-11 DIAGNOSIS — Z87891 Personal history of nicotine dependence: Secondary | ICD-10-CM | POA: Diagnosis not present

## 2019-09-11 DIAGNOSIS — Z880 Allergy status to penicillin: Secondary | ICD-10-CM | POA: Diagnosis not present

## 2019-09-11 DIAGNOSIS — Z9841 Cataract extraction status, right eye: Secondary | ICD-10-CM | POA: Insufficient documentation

## 2019-09-11 DIAGNOSIS — Z79899 Other long term (current) drug therapy: Secondary | ICD-10-CM | POA: Insufficient documentation

## 2019-09-11 DIAGNOSIS — D539 Nutritional anemia, unspecified: Secondary | ICD-10-CM | POA: Diagnosis not present

## 2019-09-11 LAB — CBC WITH DIFFERENTIAL/PLATELET
Abs Immature Granulocytes: 0.04 10*3/uL (ref 0.00–0.07)
Basophils Absolute: 0.1 10*3/uL (ref 0.0–0.1)
Basophils Relative: 1 %
Eosinophils Absolute: 0.2 10*3/uL (ref 0.0–0.5)
Eosinophils Relative: 3 %
HCT: 30 % — ABNORMAL LOW (ref 39.0–52.0)
Hemoglobin: 9.8 g/dL — ABNORMAL LOW (ref 13.0–17.0)
Immature Granulocytes: 1 %
Lymphocytes Relative: 20 %
Lymphs Abs: 1.3 10*3/uL (ref 0.7–4.0)
MCH: 35 pg — ABNORMAL HIGH (ref 26.0–34.0)
MCHC: 32.7 g/dL (ref 30.0–36.0)
MCV: 107.1 fL — ABNORMAL HIGH (ref 80.0–100.0)
Monocytes Absolute: 0.6 10*3/uL (ref 0.1–1.0)
Monocytes Relative: 10 %
Neutro Abs: 4.3 10*3/uL (ref 1.7–7.7)
Neutrophils Relative %: 65 %
Platelets: 268 10*3/uL (ref 150–400)
RBC: 2.8 MIL/uL — ABNORMAL LOW (ref 4.22–5.81)
RDW: 17.6 % — ABNORMAL HIGH (ref 11.5–15.5)
WBC: 6.4 10*3/uL (ref 4.0–10.5)
nRBC: 0.5 % — ABNORMAL HIGH (ref 0.0–0.2)

## 2019-09-11 IMAGING — CT CT BIOPSY AND ASPIRATION BONE MARROW
1 of 2 series · 10 of 14 positions shown, 13 images · non-contrast
Comparison: none

INDICATION: 73-year-old with macrocytic anemia.  Request for bone marrow biopsy.

[Series 2: i-spiral 5.0 b30f · axial · 0.81mm/px · z∈[-154,-59]mm · 10 of 35 slices shown, 13 images]
[im 4/35  soft-tissue]
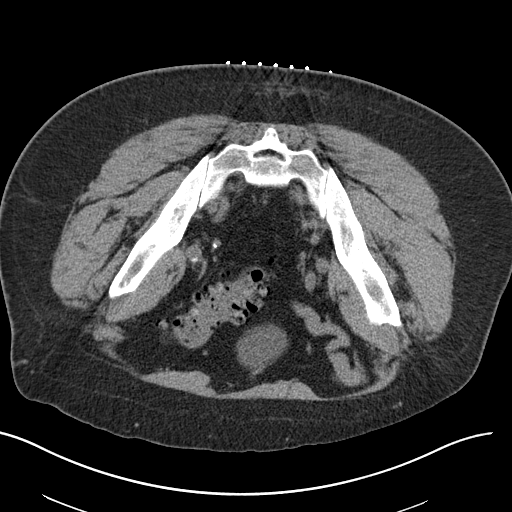
[im 4/35  bone]
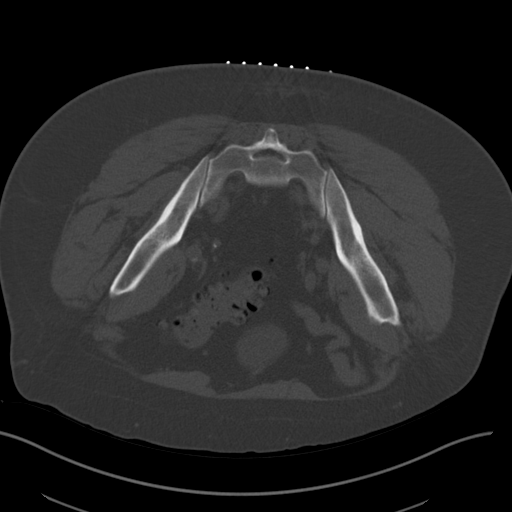
[im 7/35  bone]
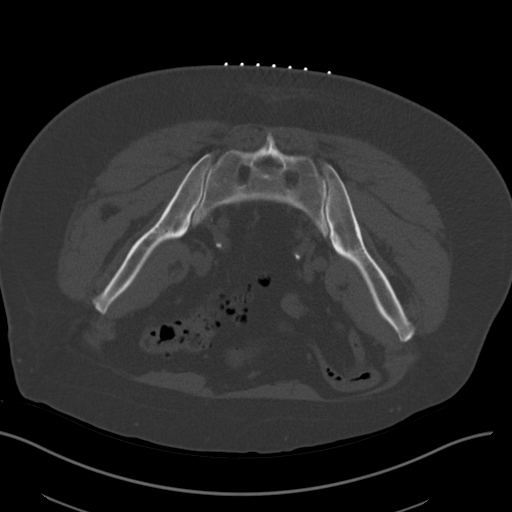
[im 10/35  bone]
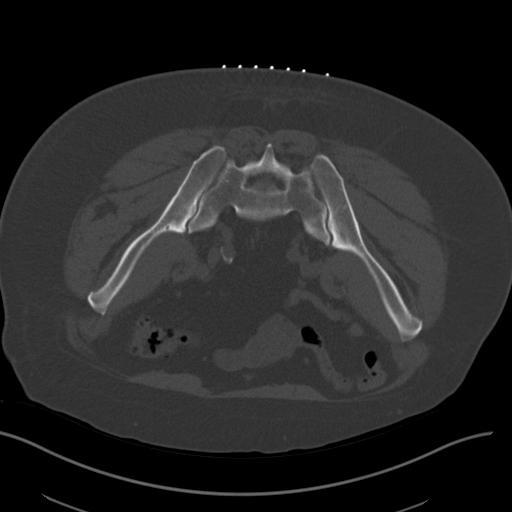
[im 13/35  bone]
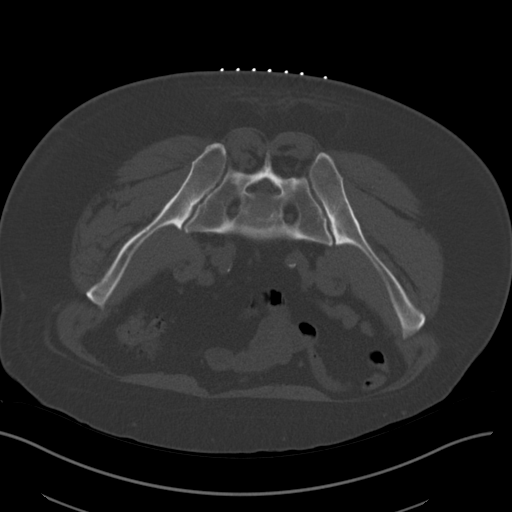
[im 16/35  soft-tissue]
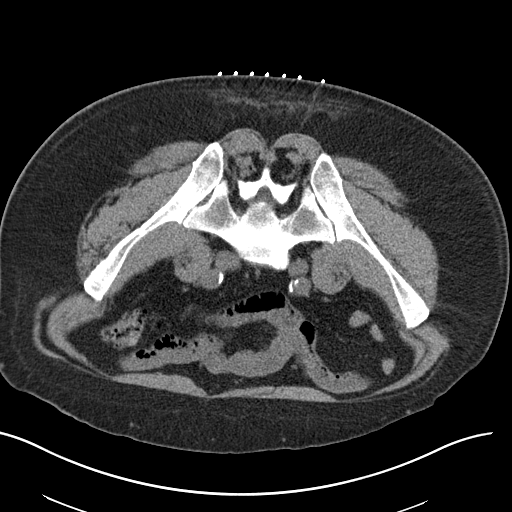
[im 16/35  bone]
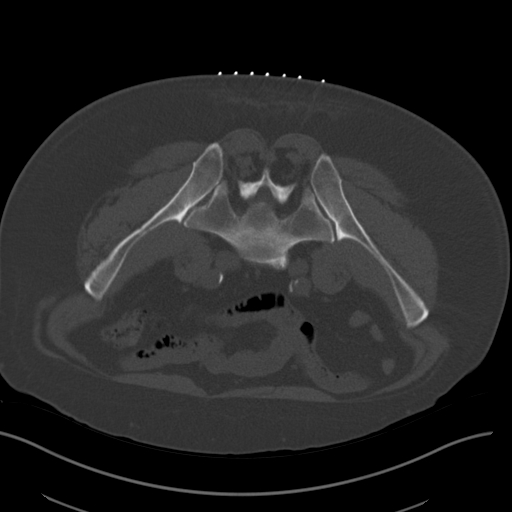
[im 19/35  bone]
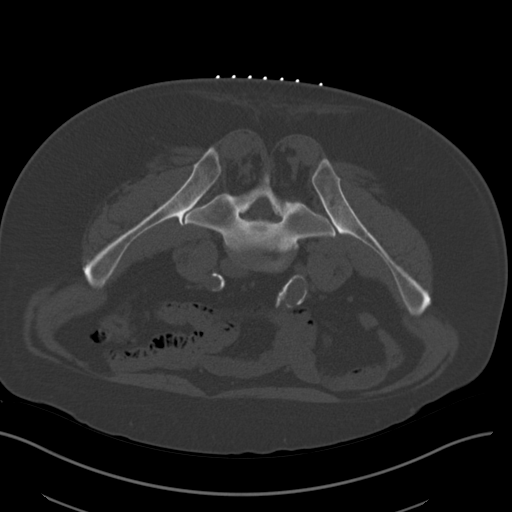
[im 22/35  bone]
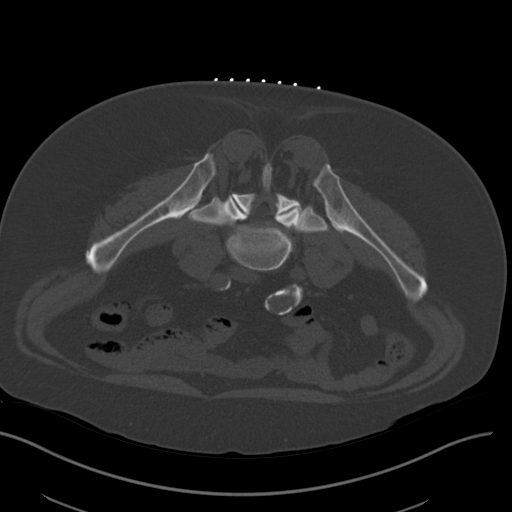
[im 25/35  bone]
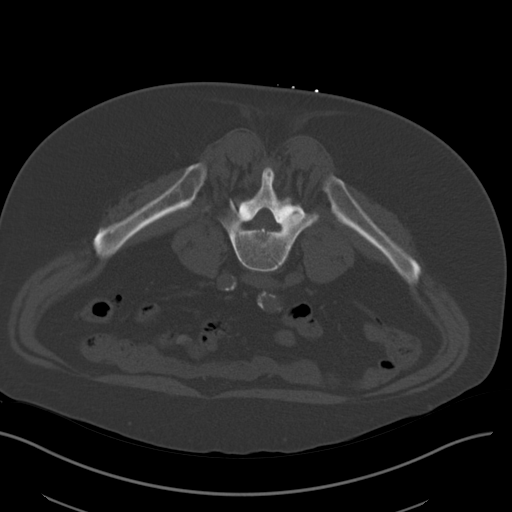
[im 28/35  soft-tissue]
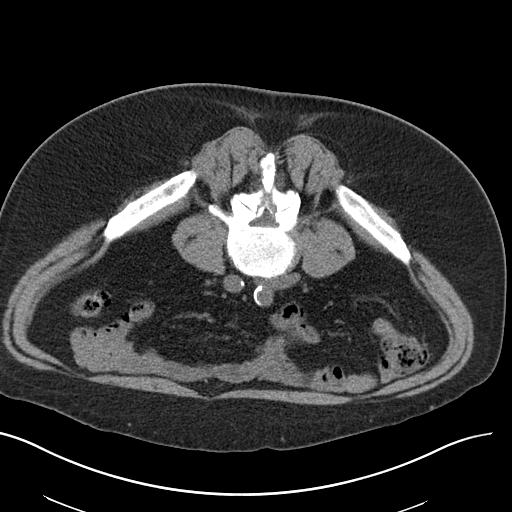
[im 28/35  bone]
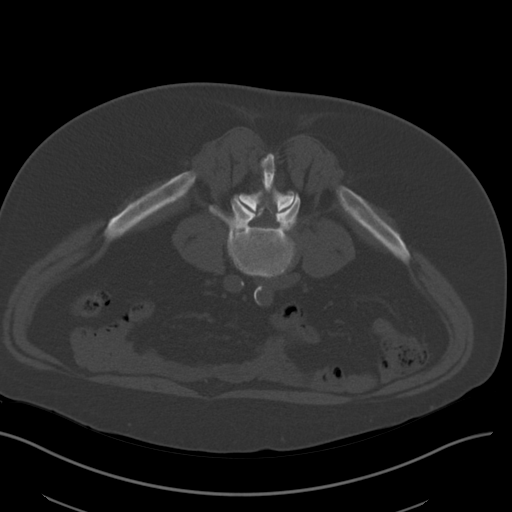
[im 31/35  bone]
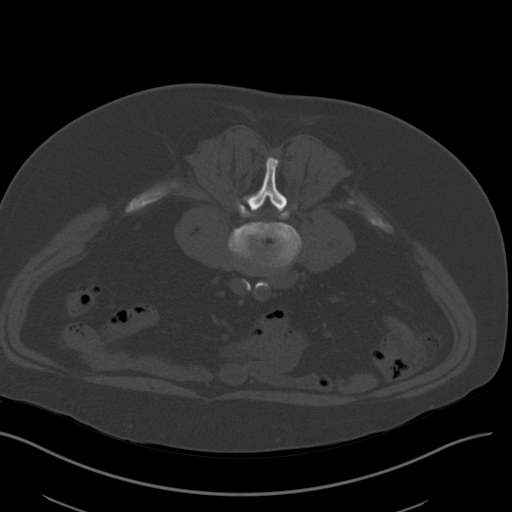

[10 of 14 positions shown; findings below may reference images not displayed]

EXAM:
CT GUIDED BONE MARROW ASPIRATES AND BIOPSY

MEDICATIONS:
None.

ANESTHESIA/SEDATION:
Fentanyl 100 mcg IV; Versed 2.0 mg IV

Moderate Sedation Time:  15 minutes

The patient was continuously monitored during the procedure by the
interventional radiology nurse under my direct supervision.

COMPLICATIONS:
None immediate.

PROCEDURE:
The procedure was explained to the patient. The risks and benefits
of the procedure were discussed and the patient's questions were
addressed. Informed consent was obtained from the patient. The
patient was placed prone on CT table. Images of the pelvis were
obtained. The right side of back was prepped and draped in sterile
fashion. The skin and right posterior ilium were anesthetized with
1% lidocaine. 11 gauge bone needle was directed into the right ilium
with CT guidance. Two aspirates and two core biopsies were obtained.
Bandage placed over the puncture site.
FINDINGS: Bone needle directed into the posterior right ilium.
IMPRESSION: CT guided bone marrow aspiration and core biopsy.

## 2019-09-11 MED ORDER — FENTANYL CITRATE (PF) 100 MCG/2ML IJ SOLN
INTRAMUSCULAR | Status: AC | PRN
  Administered 2019-09-11 (×2): 50 ug via INTRAVENOUS

## 2019-09-11 MED ORDER — MIDAZOLAM HCL 5 MG/5ML IJ SOLN
INTRAMUSCULAR | Status: AC
  Filled 2019-09-11: qty 5

## 2019-09-11 MED ORDER — FENTANYL CITRATE (PF) 100 MCG/2ML IJ SOLN
INTRAMUSCULAR | Status: AC
  Filled 2019-09-11: qty 2

## 2019-09-11 MED ORDER — HEPARIN SOD (PORK) LOCK FLUSH 100 UNIT/ML IV SOLN
INTRAVENOUS | Status: AC
  Filled 2019-09-11: qty 5

## 2019-09-11 MED ORDER — SODIUM CHLORIDE 0.9 % IV SOLN: INTRAVENOUS | Status: DC

## 2019-09-11 MED ORDER — HYDROCODONE-ACETAMINOPHEN 5-325 MG PO TABS
1.0000 | ORAL_TABLET | ORAL | Status: DC | PRN
  Filled 2019-09-11: qty 2

## 2019-09-11 MED ORDER — MIDAZOLAM HCL 5 MG/5ML IJ SOLN
INTRAMUSCULAR | Status: AC | PRN
  Administered 2019-09-11 (×2): 1 mg via INTRAVENOUS

## 2019-09-11 NOTE — Procedures (Signed)
Interventional Radiology Procedure:   Indications: Macrocytic anemia  Procedure: CT guided bone marrow biopsy  Findings: 2 aspirates and 2 cores from right ilium  Complications: None     EBL: Minimal, less than 20 ml  Plan: Discharge to home in one hour.   Brett Boone R. Anselm Pancoast, MD  Pager: [phone]

## 2019-09-11 NOTE — Discharge Instructions (Signed)
Bone Marrow Aspiration and Bone Marrow Biopsy, Adult, Care After This sheet gives you information about how to care for yourself after your procedure. Your health care provider may also give you more specific instructions. If you have problems or questions, contact your health care provider. What can I expect after the procedure? After the procedure, it is common to have:  Mild pain and tenderness.  Swelling.  Bruising. Follow these instructions at home: Puncture site care   Follow instructions from your health care provider about how to take care of the puncture site. Make sure you: ? Wash your hands with soap and water before and after you change your bandage (dressing). If soap and water are not available, use hand sanitizer. ? Change your dressing as told by your health care provider.  Check your puncture site every day for signs of infection. Check for: ? More redness, swelling, or pain. ? Fluid or blood. ? Warmth. ? Pus or a bad smell. Activity  Return to your normal activities as told by your health care provider. Ask your health care provider what activities are safe for you.  Do not lift anything that is heavier than 10 lb (4.5 kg), or the limit that you are told, until your health care provider says that it is safe.  Do not drive for 24 hours if you were given a sedative during your procedure. General instructions   Take over-the-counter and prescription medicines only as told by your health care provider.  Do not take baths, swim, or use a hot tub until your health care provider approves. Ask your health care provider if you may take showers. You may only be allowed to take sponge baths.  If directed, put ice on the affected area. To do this: ? Put ice in a plastic bag. ? Place a towel between your skin and the bag. ? Leave the ice on for 20 minutes, 2-3 times a day.  Keep all follow-up visits as told by your health care provider. This is important. Contact a  health care provider if:  Your pain is not controlled with medicine.  You have a fever.  You have more redness, swelling, or pain around the puncture site.  You have fluid or blood coming from the puncture site.  Your puncture site feels warm to the touch.  You have pus or a bad smell coming from the puncture site. Summary  After the procedure, it is common to have mild pain, tenderness, swelling, and bruising.  Follow instructions from your health care provider about how to take care of the puncture site and what activities are safe for you.  Take over-the-counter and prescription medicines only as told by your health care provider.  Contact a health care provider if you have any signs of infection, such as fluid or blood coming from the puncture site. This information is not intended to replace advice given to you by your health care provider. Make sure you discuss any questions you have with your health care provider. Document Revised: 05/31/2018 Document Reviewed: 05/31/2018 Elsevier Patient Education  2020 [employer]  

## 2019-09-11 NOTE — Consult Note (Signed)
Chief Complaint: Patient was seen in consultation today for bone marrow biopsy at the request of [place] [facility]  Referring Physician(s): [place] C  Patient Status: [facility] - Out-pt  History of Present Illness: Brett Boone is a 74 y.o. male with macrocytic anemia.  Request for a bone marrow biopsy.  Patient is currently asymptomatic.  Patient had a positive test for COVID-19 in January 2021 but he was asymptomatic.  Patient has subsequently been vaccinated.  Patient has recently lost approximately 10 pounds but this was intentional.  Past Medical History:  Diagnosis Date  . Venous stasis dermatitis of both lower extremities     Past Surgical History:  Procedure Laterality Date  . CATARACT EXTRACTION W/PHACO Right 03/09/2017   Procedure: CATARACT EXTRACTION PHACO AND INTRAOCULAR LENS PLACEMENT ([place]) RIGHT;  Surgeon: Eulogio Bear, MD;  Location: [facility];  Service: Ophthalmology;  Laterality: Right;  . COLONOSCOPY  2013   normal- cleared for 5 yrs- Dr Beckey Downing  . COLONOSCOPY WITH PROPOFOL N/A 02/17/2017   Procedure: COLONOSCOPY WITH PROPOFOL;  Surgeon: Lin Landsman, MD;  Location: [facility];  Service: Endoscopy;  Laterality: N/A;  . EPIGASTRIC HERNIA REPAIR N/A 07/06/2014   Procedure: HERNIA REPAIR EPIGASTRIC ADULT;  Surgeon: Molly Maduro, MD;  Location: [facility];  Service: General;  Laterality: N/A;  . HERNIA REPAIR Bilateral   . INSERTION OF MESH N/A 07/06/2014   Procedure: INSERTION OF MESH;  Surgeon: Molly Maduro, MD;  Location: [facility];  Service: General;  Laterality: N/A;  . POLYPECTOMY  02/17/2017   Procedure: POLYPECTOMY;  Surgeon: Lin Landsman, MD;  Location: [facility];  Service: Endoscopy;;  . SHOULDER ARTHROSCOPY Left 2008  . SINUS EXPLORATION      Allergies: Amoxicillin-pot clavulanate  Medications: Prior to Admission medications   Medication Sig Start Date End Date Taking? Authorizing  Provider  tadalafil (CIALIS) 5 MG tablet Take 5 mg by mouth daily as needed for erectile dysfunction.  06/27/19   Billey Co, MD     Family History  Problem Relation Age of Onset  . Diabetes Father   . Heart disease Father 89  . Stroke Mother     Social History   Socioeconomic History  . Marital status: Married    Spouse name: Not on file  . Number of children: 3  . Years of education: Not on file  . Highest education level: Bachelor's degree (e.g., BA, AB, BS)  Occupational History  . Occupation: Retired  Tobacco Use  . Smoking status: Former Smoker    Packs/day: 0.25    Years: 50.00    Pack years: 12.50    Types: Cigarettes    Quit date: 05/03/2018    Years since quitting: 1.3  . Smokeless tobacco: Former Systems developer  . Tobacco comment: pt quit smoking  Vaping Use  . Vaping Use: Never used  Substance and Sexual Activity  . Alcohol use: Yes    Comment: Occasional -1x/month  . Drug use: No  . Sexual activity: Yes  Other Topics Concern  . Not on file  Social History Narrative  . Not on file   Social Determinants of Health   Financial Resource Strain: Low Risk   . Difficulty of Paying Living Expenses: Not hard at all  Food Insecurity: No Food Insecurity  . Worried About Charity fundraiser in the Last Year: Never true  . Ran Out of Food in the Last Year: Never true  Transportation Needs: No Transportation Needs  .  Lack of Transportation (Medical): No  . Lack of Transportation (Non-Medical): No  Physical Activity: Inactive  . Days of Exercise per Week: 0 days  . Minutes of Exercise per Session: 0 min  Stress: No Stress Concern Present  . Feeling of Stress : Only a little  Social Connections: Moderately Integrated  . Frequency of Communication with Friends and Family: More than three times a week  . Frequency of Social Gatherings with Friends and Family: Once a week  . Attends Religious Services: More than 4 times per year  . Active Member of Clubs or  Organizations: No  . Attends Archivist Meetings: Never  . Marital Status: Married    Review of Systems: A 12 point ROS discussed and pertinent positives are indicated in the HPI above.  All other systems are negative.  Review of Systems  Constitutional: Negative.   Respiratory: Negative.   Cardiovascular: Negative.   Gastrointestinal: Negative.   Genitourinary: Negative.   Neurological: Negative.     Vital Signs: BP 115/70   Pulse 62   Temp 98.4 F (36.9 C) (Oral)   Resp 17   Ht 5' 11"  (1.803 m)   Wt 115.7 kg   SpO2 99%   BMI 35.57 kg/m   Physical Exam Constitutional:      Appearance: He is not ill-appearing.  Cardiovascular:     Rate and Rhythm: Normal rate and regular rhythm.  Pulmonary:     Effort: Pulmonary effort is normal.     Breath sounds: Wheezing present.     Comments: Few wheezes in the left lung.  Right lung is clear. Neurological:     Mental Status: He is alert.     Imaging: No results found.  Labs:  CBC: Recent Labs    07/19/19 0948 07/26/19 1205 08/15/19 1436  WBC 4.9 7.2 6.2  HGB 9.5* 9.8* 9.3*  HCT 27.3* 28.5* 29.0*  PLT 297 324 309    COAGS: No results for input(s): INR, APTT in the last 8760 hours.  BMP: Recent Labs    07/19/19 0948  NA 141  K 4.7  CL 105  CO2 24  GLUCOSE 100*  BUN 20  CALCIUM 9.1  CREATININE 1.03  GFRNONAA 72  GFRAA 84    LIVER FUNCTION TESTS: Recent Labs    07/19/19 0948  BILITOT 1.0  AST 17  ALT 14  ALKPHOS 50  PROT 6.4  ALBUMIN 4.1    TUMOR MARKERS: No results for input(s): AFPTM, CEA, CA199, CHROMGRNA in the last 8760 hours.  Assessment and Plan:  74 year old with macrocytic anemia and request for bone marrow biopsy.  Risks and benefits of CT-guided bone marrow biopsy was discussed with the patient and/or patient's family including, but not limited to bleeding, infection, damage to adjacent structures or low yield requiring additional tests.  All of the questions were  answered and there is agreement to proceed.  Consent signed and in chart.    Thank you for this interesting consult.  I greatly enjoyed meeting Brett Boone and look forward to participating in their care.  A copy of this report was sent to the requesting provider on this date.  Electronically Signed: Burman Riis, MD 09/11/2019, 8:33 AM   I spent a total of  10 minutes in face to face in clinical consultation, greater than 50% of which was counseling/coordinating care for CT-guided bone marrow biopsy.

## 2019-09-13 LAB — SURGICAL PATHOLOGY

## 2019-09-14 ENCOUNTER — Telehealth: Payer: Self-pay

## 2019-09-14 ENCOUNTER — Telehealth: Payer: Self-pay | Admitting: *Deleted

## 2019-09-14 NOTE — Telephone Encounter (Signed)
  Please schedule next available.  M

## 2019-09-14 NOTE — Telephone Encounter (Signed)
spoke with the patient to see if i could help with anything. he is schedule for appointment on Thursday and wanted the BX result. I expalined to him that we schedule the patient out so the result will be inm when you come back for the visit. The patient was understanding

## 2019-09-14 NOTE — Telephone Encounter (Signed)
Ms Aderman called stating that they see that the BMBX results are in and is asking if his appointment for results can be moved up. Please return call

## 2019-09-15 ENCOUNTER — Telehealth: Payer: Self-pay

## 2019-09-15 NOTE — Telephone Encounter (Signed)
The patient has been schedule for the [place] and he is aware of the appointment.

## 2019-09-15 NOTE — Telephone Encounter (Signed)
The patient has called the office for a eariler appointment. Per Dr Mike Gip the patient can be seen Tuesday at 11:45.    Prior note is a error

## 2019-09-18 ENCOUNTER — Encounter (HOSPITAL_COMMUNITY): Payer: Self-pay | Admitting: Hematology and Oncology

## 2019-09-18 NOTE — Progress Notes (Signed)
[facility]  [address], [address] [place], [place] [address] Phone: [phone]  Fax: [phone]   Clinic Day:  09/19/2019  Referring physician: [name], MD  Chief Complaint: Brett Boone is a 74 y.o. male with a macrocytic anemia who is seen for review of bone marrow and discussion regarding direction of therapy.  HPI: The patient was last seen in the hematology clinic on 08/22/2019. At that time, he was doing well.  He denied any concerns. 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 He had macrocytic anemia. We discussed a bone marrow to rule out a myelodysplastic syndrome.  Bone marrow biopsy on 09/11/2019 revealed macrocytic anemia and hypercellular bone marrow for age with dyspoietic changes primarily involving the erythroid cell lines associated with abundant ring sideroblasts (> 15%). There was no increase in blasts. Flow cytometry was negative. Cytogenetics revealed 45,X,-Y [20].  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  CBC on 09/11/2019 revealed a hematocrit of 30.0, hemoglobin 9.8, MCV 107.1, platelets 268,000, WBC 6,400.  During the interim, he has been fine. He is a little bit nervous about his appointment today.   Past Medical History:  Diagnosis Date  . Venous stasis dermatitis of both lower extremities     Past Surgical History:  Procedure Laterality Date  . CATARACT EXTRACTION W/PHACO Right 03/09/2017   Procedure: CATARACT EXTRACTION PHACO AND INTRAOCULAR LENS PLACEMENT ([place]) RIGHT;  Surgeon: Eulogio Bear, MD;  Location: [place];  Service: Ophthalmology;  Laterality: Right;  . COLONOSCOPY  2013   normal- cleared for 5 yrs- Dr Beckey Downing  . COLONOSCOPY WITH PROPOFOL N/A 02/17/2017   Procedure: COLONOSCOPY WITH PROPOFOL;  Surgeon: Lin Landsman, MD;  Location: [place];  Service: Endoscopy;  Laterality:  N/A;  . EPIGASTRIC HERNIA REPAIR N/A 07/06/2014   Procedure: HERNIA REPAIR EPIGASTRIC ADULT;  Surgeon: Molly Maduro, MD;  Location: [facility];  Service: General;  Laterality: N/A;  . HERNIA REPAIR Bilateral   . INSERTION OF MESH N/A 07/06/2014   Procedure: INSERTION OF MESH;  Surgeon: Molly Maduro, MD;  Location: [facility];  Service: General;  Laterality: N/A;  . POLYPECTOMY  02/17/2017   Procedure: POLYPECTOMY;  Surgeon: Lin Landsman, MD;  Location: [place];  Service: Endoscopy;;  . SHOULDER ARTHROSCOPY Left 2008  . SINUS EXPLORATION      Family History  Problem Relation Age of Onset  . Diabetes Father   . Heart disease Father 24  . Stroke Mother     Social History:  reports that he quit smoking about 16 months ago. His smoking use included cigarettes. He has a 12.50 pack-year smoking history. He has quit using smokeless tobacco. He reports current alcohol use. He reports that he does not use drugs. 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 The patient is accompanied by his wife, Jackelyn Poling, via iPad today.  Allergies:  Allergies  Allergen Reactions  . Amoxicillin-Pot Clavulanate Hives and Nausea And Vomiting    Current Medications: Current Outpatient Medications  Medication Sig Dispense Refill  . tadalafil (CIALIS) 5 MG tablet Take 5 mg by mouth daily as needed for erectile dysfunction.      No current facility-administered medications for this visit.    Review of Systems  Constitutional: Negative for chills, diaphoresis, fever, malaise/fatigue and weight loss (up 1 lb).  HENT: Negative for congestion, ear discharge,  ear pain, hearing loss, nosebleeds, sinus pain, sore throat and tinnitus.   Eyes: Negative for blurred vision.  Respiratory: Negative for cough, hemoptysis, sputum production and shortness of breath.   Cardiovascular: Negative for chest pain, palpitations and leg swelling.  Gastrointestinal: Negative  for abdominal pain, blood in stool, constipation, diarrhea, heartburn, melena, nausea and vomiting.  Genitourinary: Negative for dysuria, frequency, hematuria and urgency.  Musculoskeletal: Negative for back pain, joint pain, myalgias and neck pain.  Skin: Negative for itching and rash.  Neurological: Negative for dizziness, tingling, sensory change, weakness and headaches.  Endo/Heme/Allergies: Does not bruise/bleed easily.  Psychiatric/Behavioral: Negative for depression and memory loss. The patient is nervous/anxious (about appointment today). The patient does not have insomnia.   All other systems reviewed and are negative.  Performance status (ECOG): 0  Vitals Blood pressure 132/69, pulse 64, temperature 97.6 F (36.4 C), temperature source Tympanic, height 5' 11"  (1.803 m), weight 254 lb 11.9 oz (115.5 kg), SpO2 99 %.   Physical Exam Vitals and nursing note reviewed.  Constitutional:      General: He is not in acute distress.    Appearance: He is not diaphoretic.  HENT:     Head:     Comments: Gray hair. Eyes:     General: No scleral icterus.    Conjunctiva/sclera: Conjunctivae normal.  Neurological:     Mental Status: He is alert and oriented to person, place, and time. Mental status is at baseline.  Psychiatric:        Mood and Affect: Mood normal.        Behavior: Behavior normal.        Thought Content: Thought content normal.        Judgment: Judgment normal.    No visits with results within 3 Day(s) from this visit.  Latest known visit with results is:  Hospital Outpatient Visit on 09/11/2019  Component Date Value Ref Range Status  . WBC 09/11/2019 6.4  4.0 - 10.5 K/uL Final  . RBC 09/11/2019 2.80* 4.22 - 5.81 MIL/uL Final  . Hemoglobin 09/11/2019 9.8* 13.0 - 17.0 g/dL Final  . HCT 09/11/2019 30.0* 39 - 52 % Final  . MCV 09/11/2019 107.1* 80.0 - 100.0 fL Final  . MCH 09/11/2019 35.0* 26.0 - 34.0 pg Final  . MCHC 09/11/2019 32.7  30.0 - 36.0 g/dL Final  . RDW  09/11/2019 17.6* 11.5 - 15.5 % Final  . Platelets 09/11/2019 268  150 - 400 K/uL Final  . nRBC 09/11/2019 0.5* 0.0 - 0.2 % Final  . Neutrophils Relative % 09/11/2019 65  % Final  . Neutro Abs 09/11/2019 4.3  1.7 - 7.7 K/uL Final  . Lymphocytes Relative 09/11/2019 20  % Final  . Lymphs Abs 09/11/2019 1.3  0.7 - 4.0 K/uL Final  . Monocytes Relative 09/11/2019 10  % Final  . Monocytes Absolute 09/11/2019 0.6  0 - 1 K/uL Final  . Eosinophils Relative 09/11/2019 3  % Final  . Eosinophils Absolute 09/11/2019 0.2  0 - 0 K/uL Final  . Basophils Relative 09/11/2019 1  % Final  . Basophils Absolute 09/11/2019 0.1  0 - 0 K/uL Final  . Immature Granulocytes 09/11/2019 1  % Final  . Abs Immature Granulocytes 09/11/2019 0.04  0.00 - 0.07 K/uL Final   Performed at [facility], [address] [place], [place] [address]  . SURGICAL PATHOLOGY 09/11/2019    Final-Edited  Value:Surgical Pathology CASE: [id number] PATIENT: Brett Boone Bone Marrow Report     Clinical History: Macrocytic anemia, right ilium, (ADC)     DIAGNOSIS:  BONE MARROW, ASPIRATE, CLOT, CORE: -Hypercellular bone marrow for age with dyspoietic changes -See comment  PERIPHERAL BLOOD: -Macrocytic anemia  COMMENT:  The bone marrow is hypercellular for age with dyspoietic changes primarily involving the erythroid cell lines associated with abundant ring sideroblasts.  No increase in blastic cells is identified.  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  Correlation with cytogenetic, FISH and molecular studies is recommended.  MICROSCOPIC DESCRIPTION:  PERIPHERAL BLOOD SMEAR: The red blood cells display mild anisopoikilocytosis with mild polychromasia.  The white blood cells are normal in number with no significant morphologic abnormalities.  The platelets are normal in number.  BONE MARROW ASPIRATE: Bone marrow pa                          rticles present Erythroid precursors: Progressive maturation but with many maturing cells displaying nuclear cytoplasmic dyssynchrony Granulocytic precursors: Orderly and progressive maturation for the most part.  No increase in blastic cells identified Megakaryocytes: Abundant with predominantly normal morphology Lymphocytes/plasma cells: Large aggregates not present  TOUCH PREPARATIONS: A mixture of cell types present  CLOT AND BIOPSY: The sections show 80% percent cellularity with a mixture of myeloid cell types.  Significant lymphoid aggregates are not present.  IRON STAIN: Iron stains are performed on a bone marrow aspirate or touch imprint smear and section of clot. The controls stained appropriately.       Storage Iron: Abundant      Ring Sideroblasts: Abundant (more than 15% of erythroid precursors)  ADDITIONAL DATA/TESTING: The specimen was sent for cytogenetic studies, FISH for MDS, and mutational analysis for SF3B1 and a separate report will follow.  Fl                         ow cytometric analysis ([id number]) failed to show any significant blastic population, monoclonal B-cell population or abnormal T-cell phenotype.  CELL COUNT DATA:  Bone Marrow count performed on 500 cells shows: Blasts:   0%   Myeloid:  45% Promyelocytes: 1%   Erythroid:     43% Myelocytes:    8%   Lymphocytes:   7% Metamyelocytes:     0%   Plasma cells:  1% Bands:    15% Neutrophils:   18%  M:E ratio:     1.05 Eosinophils:   3% Basophils:     0% Monocytes:     4%  Lab Data: CBC performed on 09/11/2019 shows: WBC: 6.4 k/uL  Neutrophils:   66% Hgb: 9.8 g/dL  Lymphocytes:   21% HCT: 30.0 %    Monocytes:     11% MCV: 107.1 fL  Eosinophils:   1% RDW: 17.6 %    Basophils:     1% PLT: 268 k/uL    GROSS DESCRIPTION:  Specimen A: Aspirate smear.  Specimen B: Received in B-plus is a 1 x 0.7 x 0.1 cm aggregate of dark red soft material, submitted in 1 block.  Specimen C: Received in  B-plus are several cores and irregular pieces of tan-red firm tissue, 1 x 1 x 0                         .2 cm in aggregate, submitted in 1 block following decalcification in  Immunocal.  SW 09/11/2019   Final Diagnosis performed by Susanne Greenhouse, MD.   Electronically signed 09/13/2019 Technical and / or Professional components performed at [facility], [place] [address] [place], [place] [address].  Immunohistochemistry Technical component (if applicable) was performed at [facility] [address], [place], [place], [place] [address].   IMMUNOHISTOCHEMISTRY DISCLAIMER (if applicable): Some of these immunohistochemical stains may have been developed and the performance characteristics determine by [facility]. Some may not have been cleared or approved by the U.S. Food and Drug Administration. The FDA has determined that such clearance or approval is not necessary. This test is used for clinical purposes. It should not be regarded as investigational or for research. This laboratory is certified under the [place] (CLIA-88) as qualified to perform high complexity clinical laboratory testing.  The controls stained appropriately.   . SURGICAL PATHOLOGY 09/11/2019    Final-Edited                   Value:Surgical Pathology CASE: [id number] PATIENT: Brett Boone Flow Pathology Report     Clinical history: Macrocytic anemia     DIAGNOSIS:  - No significant blastic population identified. - No monoclonal B-cell population or abnormal T-cell phenotype identified.   GATING AND PHENOTYPIC ANALYSIS:  Gated population: Flow cytometric immunophenotyping is performed using antibodies to the antigens listed in the table below. Electronic gates are placed around a cell cluster displaying light scatter properties corresponding to: lymphocytes and blasts  Abnormal  Cells in gated population: N/A  Phenotype of Abnormal Cells: N/A                       Lymphoid Antigens       Myeloid Antigens Miscellaneous CD2  tested    CD10 tested    CD11b     tested    CD45 tested CD3  tested    CD19 tested    CD11c     ND   HLA-Dr    tested CD4  tested    CD20 tested    CD13 tested    CD34 tested CD5  tested    CD22 ND   CD14 tested    CD38 tested CD7  tested    CD79b                              ND   CD15 tested    CD138     ND CD8  tested    CD103     ND   CD16 tested    TdT  ND CD25 ND   CD200     tested    CD33 tested    CD123     tested TCRab     ND   sKappa    tested    CD64 tested    CD41 ND TCRgd     tested    sLambda   tested    CD117     tested    CD61 ND CD56 tested    cKappa    ND   MPO  ND   CD71 ND CD57 ND   cLambda   ND  CD235a    ND      GROSS DESCRIPTION:  Reference Bone Marrow case [phone].    Final Diagnosis performed by Susanne Greenhouse, MD.   Electronically signed 09/13/2019 Technical and / or Professional components performed at [facility], [place] [address], [place], [place] [address].  The above tests were developed and their performance characteristics determined by the [facility] system for the physical and immunophenotypic characterization of cell populations. They have not been cleared by the U.S. Food and Drug administration. The  FDA has determined that such clearance or approval is not necessar                         y. This test is used for clinical purposes. 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  He presented with a macrocytic anemia.  He denies any underlying liver disease.  He drinks socially.  Bone marrow biopsy on 09/11/2019 revealed macrocytic anemia and hypercellular bone marrow for age with dyspoietic changes primarily involving the erythroid  cell lines associated with abundant ring sideroblasts (> 15%). There was no increase in blasts. Flow cytometry was negative. Cytogenetics revealed 45,X,-Y [20].  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 IPSS score is low.  CBC on 07/26/2019 revealed a hematocrit 28.5, hemoglobin 9.8, MCV 102, platelets 324,000, WBC 7200 with an ANC 4800.  Labs on 07/19/2019 included a ferritin of 1020, B12 of 1083 and folate 6.9.  Fecal occult blood was positive on 08/01/2019.   Work-up on 08/15/2019 revealed a  hematocrit of 29.0, hemoglobin 9.3, MCV 109.0, platelets 309,000, WBC 6,200. Ferritin was 641 with an iron saturation of 40% and a TIBC of 241. Reticulocyte count was 3.2%. TSH was 1.275 and free T4 was 0.85.  CRP was 0.6 and sed rate 24.  Ferritin has been followed: 1020 on 07/19/2019 and 641 on 08/15/2019.  He has a smoking history.  Lung cancer screening chest CT on 08/01/2019 revealed Lung-RADS 3, probably benign findings. There was a new 4.5 mm basilar left lower lobe pulmonary nodule and a sable ectatic 4.1 cm ascending thoracic  Symptomatically, he feels fine.  He denies any complaints.  Plan: 1.   Labs today:  erythropoietin level. 2.   Myelodysplastic syndrome with ringed sideroblasts             Review entire work-up including bone marrow.  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  Discuss low IPSS score.   Median survival without therapy is 5.7years.  25% progression to AML without treatment is 9.4 years.  Discuss plan for checking erythropoietin level.  Anticipate initiation of Retacrit or Aranesp to maintain a hemoglobin > 10   Information provided on Retacrit and Aranesp.  Multiple questions asked and answered.. 3.   Elevated ferritin             Ferritin was 1021 on 07/19/2019 and 641 on 08/15/2019. Sed rate and CRP low.             Iron saturation is 40% (high).                   Doubt hemochromatosis as  ferritin is spontaneously declining.  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   Patients  do not typically manifest disease unless they have a rare mutation that is not tested.  Continue to monitor. 4.   Preauth Retacrit/Aranesp. 5.   RTC monthly for labs (CBC) +/- Retacrit. 6.   RTC in 3 months for MD assess (CBC with diff, CMP, ferritin), and +/- Retacrit.  I discussed the assessment and treatment plan with the patient.  The patient was provided an opportunity to ask questions and all were answered.  The patient agreed with the plan and demonstrated an understanding of the instructions.  The patient was advised to call back if the symptoms worsen or if the condition fails to improve as anticipated.  I provided 17 minutes of face-to-face time during this this encounter and > 50% was spent counseling as documented under my assessment and plan. An additional 10 minutes were spent reviewing his chart (Epic and Care Everywhere) including notes, labs, pathology, and imaging studies.   Brett Boone C. Mike Gip, MD, PhD    09/19/2019, 11:57 AM  I, Mirian Mo Tufford, am acting as Education administrator for [employer]. Mike Gip, MD, PhD.  I, Hyun Reali C. Mike Gip, MD, have reviewed the above documentation for accuracy and completeness, and I agree with the above.

## 2019-09-19 ENCOUNTER — Inpatient Hospital Stay: Payer: Medicare Other | Attending: Hematology and Oncology | Admitting: Hematology and Oncology

## 2019-09-19 ENCOUNTER — Encounter: Payer: Self-pay | Admitting: Hematology and Oncology

## 2019-09-19 ENCOUNTER — Inpatient Hospital Stay: Payer: Medicare Other

## 2019-09-19 ENCOUNTER — Other Ambulatory Visit: Payer: Self-pay

## 2019-09-19 VITALS — BP 132/69 | HR 64 | Temp 97.6°F | Ht 71.0 in | Wt 254.7 lb

## 2019-09-19 DIAGNOSIS — Z148 Genetic carrier of other disease: Secondary | ICD-10-CM

## 2019-09-19 DIAGNOSIS — Z122 Encounter for screening for malignant neoplasm of respiratory organs: Secondary | ICD-10-CM | POA: Diagnosis not present

## 2019-09-19 DIAGNOSIS — D461 Refractory anemia with ring sideroblasts: Secondary | ICD-10-CM | POA: Insufficient documentation

## 2019-09-19 NOTE — Progress Notes (Signed)
No new changes noted today 

## 2019-09-19 NOTE — Patient Instructions (Signed)
 Myelodysplastic Syndrome Myelodysplastic syndrome (MDS) is a group of cancers that affect blood cells. New blood cells are also called immature cells or stem cells. MDS starts in the bone marrow where new blood cells are made. The bone marrow makes:  Red blood cells. These carry oxygen.  White blood cells. These fight infection.  Platelets. These stop bleeding. With MDS, some immature cells do not grow into adult blood cells. These cells will die before they reach maturity. Immature blood cells build up in the bone marrow and crowd out normal adult blood cells. This causes a low blood count and can affect how many healthy red blood cells, white blood cells, and platelets you have. If you have too few red blood cells, your blood may not have enough oxygen, and you will feel tired. If you do not have enough white blood cells, you might get infections often. If you have too few platelets, you may bruise or bleed easily. What are the causes? This condition may be caused by:  Being exposed to certain medicines or chemicals.  Radiation treatment. In some cases, the cause may not be known. What increases the risk? This condition is more likely to develop in:  Caucasians.  Males.  People who are 60 years of age or older.  People who have been treated with cancer medicines or radiation.  People who have been exposed to tobacco smoke, pesticides, lead, mercury, or the benzene chemical.  People with a family history of MDS.  People who inherit certain abnormal genes that lead to bone marrow problems. What are the signs or symptoms? Symptoms often develop slowly over time. Symptoms depend on which blood cells are affected. Symptoms include:  Being tired all the time.  Feeling out of breath.  Having pale skin.  Getting infections often.  Having pain in the bones.  Feeling weak.  Having a fever often.  Bruising easily.  Bleeding that lasts longer than normal.  Having tiny  spots of bleeding under the skin (petechiae). How is this diagnosed? This condition is diagnosed based on your symptoms, medical history, and physical exam. You may also have tests, including:  A complete blood count. This test counts the number of red, white, and platelet blood cells.  Peripheral blood smear. This test checks the number and type of blood cells. It also checks their size and shape.  Bone marrow aspiration and biopsy. Aspiration involves using a needle to remove the liquid portion of the bone marrow, and a biopsy involves removing the bone marrow tissue. The samples removed will be examined under a microscope. How is this treated? There is no cure for MDS. Treatment focuses on relieving your symptoms and slowing down the production of immature blood cells. The type of treatment that is best for you depends on the type of MDS you have. Treatment may include:  Blood transfusions.  Chemotherapy. This is the use of medicines to kill cancer cells.  Targeted therapy. This treatment targets specific parts of cancer cells and the area around them to block the growth and spread of the cancer.  Immunotherapy or biologic therapy. This treatment helps your body boost its own immune system and natural defenses to stop the growth and spread of cancer cells.  Medicines that: ? Slow down the number of immature blood cells that are made. ? Help immature cells develop into adult cells. ? Make more blood cells. ? Treat infections (antibiotics).  Bone marrow stem cell transplant. In this procedure, your stem cells   be replaced with healthy stem cells from a donor. Follow these instructions at home: Medicines  Take over-the-counter and prescription medicines only as told by your health care provider.  Do not take aspirin unless your health care provider approves. Aspirin can make bleeding more likely.  If you were prescribed an antibiotic, take it as told by your health care  provider. Do not stop taking the antibiotic even if you start to feel better. Lifestyle   Avoid dangerous activities that could lead to bleeding. Ask your health care provider what activities are safe for you.  Wash all fruits and vegetables before eating them. Make sure meat and eggs are well cooked. Do not eat raw fish and shellfish.  Wash your hands often with soap and water. If soap and water are not available, use hand sanitizer.  Do not use any tobacco products, such as cigarettes, chewing tobacco, and e-cigarettes. If you need help quitting, ask your health care provider. General instructions  Stay away from people who are sick.  Keep all follow-up visits as told by your health care provider. This is important.  Talk to your health care provider before having any medical or dental procedure. Contact a health care provider if:  You feel more tired than normal.  You have more bruising than usual.  You have trouble catching your breath.  You have any symptoms of infection, such as: ? Fever. ? Chills. ? Abnormal urinary symptoms. ? Cough. ? Body aches. ? Headache. ? Weakness. ? Dizziness. Get help right away if:  Your symptoms get worse.  You have bleeding that does not stop.  You have trouble breathing.  You have chest pain. This information is not intended to replace advice given to you by your health care provider. Make sure you discuss any questions you have with your health care provider. Document Revised: 05/06/2018 Document Reviewed: 01/27/2015 Elsevier Patient Education  [place].   Darbepoetin Alfa injection What is this medicine? DARBEPOETIN ALFA (dar be POE e tin AL fa) helps your body make more red blood cells. It is used to treat anemia caused by chronic kidney failure and chemotherapy. This medicine may be used for other purposes; ask your health care provider or pharmacist if you have questions. COMMON BRAND NAME(S): Aranesp What  should I tell my health care provider before I take this medicine? They need to know if you have any of these conditions:  blood clotting disorders or history of blood clots  cancer patient not on chemotherapy  cystic fibrosis  heart disease, such as angina, heart failure, or a history of a heart attack  hemoglobin level of 12 g/dL or greater  high blood pressure  low levels of folate, iron, or vitamin B12  seizures  an unusual or allergic reaction to darbepoetin, erythropoietin, albumin, hamster proteins, latex, other medicines, foods, dyes, or preservatives  pregnant or trying to get pregnant  breast-feeding How should I use this medicine? This medicine is for injection into a vein or under the skin. It is usually given by a health care professional in a hospital or clinic setting. If you get this medicine at home, you will be taught how to prepare and give this medicine. Use exactly as directed. Take your medicine at regular intervals. Do not take your medicine more often than directed. It is important that you put your used needles and syringes in a special sharps container. Do not put them in a trash can. If you do not have a  sharps container, call your pharmacist or healthcare provider to get one. A special MedGuide will be given to you by the pharmacist with each prescription and refill. Be sure to read this information carefully each time. Talk to your pediatrician regarding the use of this medicine in children. While this medicine may be used in children as young as 36 month of age for selected conditions, precautions do apply. Overdosage: If you think you have taken too much of this medicine contact a poison control center or emergency room at once. NOTE: This medicine is only for you. Do not share this medicine with others. What if I miss a dose? If you miss a dose, take it as soon as you can. If it is almost time for your next dose, take only that dose. Do not take double  or extra doses. What may interact with this medicine? Do not take this medicine with any of the following medications:  epoetin alfa This list may not describe all possible interactions. Give your health care provider a list of all the medicines, herbs, non-prescription drugs, or dietary supplements you use. Also tell them if you smoke, drink alcohol, or use illegal drugs. Some items may interact with your medicine. What should I watch for while using this medicine? Your condition will be monitored carefully while you are receiving this medicine. You may need blood work done while you are taking this medicine. This medicine may cause a decrease in vitamin B6. You should make sure that you get enough vitamin B6 while you are taking this medicine. Discuss the foods you eat and the vitamins you take with your health care professional. What side effects may I notice from receiving this medicine? Side effects that you should report to your doctor or health care professional as soon as possible:  allergic reactions like skin rash, itching or hives, swelling of the face, lips, or tongue  breathing problems  changes in vision  chest pain  confusion, trouble speaking or understanding  feeling faint or lightheaded, falls  high blood pressure  muscle aches or pains  pain, swelling, warmth in the leg  rapid weight gain  severe headaches  sudden numbness or weakness of the face, arm or leg  trouble walking, dizziness, loss of balance or coordination  seizures (convulsions)  swelling of the ankles, feet, hands  unusually weak or tired Side effects that usually do not require medical attention (report to your doctor or health care professional if they continue or are bothersome):  diarrhea  fever, chills (flu-like symptoms)  headaches  nausea, vomiting  redness, stinging, or swelling at site where injected This list may not describe all possible side effects. Call your doctor  for medical advice about side effects. You may report side effects to FDA at 1-800-FDA-1088. Where should I keep my medicine? Keep out of the reach of children. Store in a refrigerator between 2 and 8 degrees C (36 and 46 degrees F). Do not freeze. Do not shake. Throw away any unused portion if using a single-dose vial. Throw away any unused medicine after the expiration date. NOTE: This sheet is a summary. It may not cover all possible information. If you have questions about this medicine, talk to your doctor, pharmacist, or health care provider.  2020 Elsevier/Gold Standard (2017-01-27 16:44:20)    Epoetin Alfa injection What is this medicine? EPOETIN ALFA (e POE e tin AL fa) helps your body make more red blood cells. This medicine is used to treat anemia caused  by chronic kidney disease, cancer chemotherapy, or HIV-therapy. It may also be used before surgery if you have anemia. This medicine may be used for other purposes; ask your health care provider or pharmacist if you have questions. COMMON BRAND NAME(S): Epogen, Procrit, Retacrit What should I tell my health care provider before I take this medicine? They need to know if you have any of these conditions:  cancer  heart disease  high blood pressure  history of blood clots  history of stroke  low levels of folate, iron, or vitamin B12 in the blood  seizures  an unusual or allergic reaction to erythropoietin, albumin, benzyl alcohol, hamster proteins, other medicines, foods, dyes, or preservatives  pregnant or trying to get pregnant  breast-feeding How should I use this medicine? This medicine is for injection into a vein or under the skin. It is usually given by a health care professional in a hospital or clinic setting. If you get this medicine at home, you will be taught how to prepare and give this medicine. Use exactly as directed. Take your medicine at regular intervals. Do not take your medicine more often than  directed. It is important that you put your used needles and syringes in a special sharps container. Do not put them in a trash can. If you do not have a sharps container, call your pharmacist or healthcare provider to get one. A special MedGuide will be given to you by the pharmacist with each prescription and refill. Be sure to read this information carefully each time. Talk to your pediatrician regarding the use of this medicine in children. While this drug may be prescribed for selected conditions, precautions do apply. Overdosage: If you think you have taken too much of this medicine contact a poison control center or emergency room at once. NOTE: This medicine is only for you. Do not share this medicine with others. What if I miss a dose? If you miss a dose, take it as soon as you can. If it is almost time for your next dose, take only that dose. Do not take double or extra doses. What may interact with this medicine? Interactions have not been studied. This list may not describe all possible interactions. Give your health care provider a list of all the medicines, herbs, non-prescription drugs, or dietary supplements you use. Also tell them if you smoke, drink alcohol, or use illegal drugs. Some items may interact with your medicine. What should I watch for while using this medicine? Your condition will be monitored carefully while you are receiving this medicine. You may need blood work done while you are taking this medicine. This medicine may cause a decrease in vitamin B6. You should make sure that you get enough vitamin B6 while you are taking this medicine. Discuss the foods you eat and the vitamins you take with your health care professional. What side effects may I notice from receiving this medicine? Side effects that you should report to your doctor or health care professional as soon as possible:  allergic reactions like skin rash, itching or hives, swelling of the face, lips, or  tongue  seizures  signs and symptoms of a blood clot such as breathing problems; changes in vision; chest pain; severe, sudden headache; pain, swelling, warmth in the leg; trouble speaking; sudden numbness or weakness of the face, arm or leg  signs and symptoms of a stroke like changes in vision; confusion; trouble speaking or understanding; severe headaches; sudden numbness or weakness of  the face, arm or leg; trouble walking; dizziness; loss of balance or coordination Side effects that usually do not require medical attention (report to your doctor or health care professional if they continue or are bothersome):  chills  cough  dizziness  fever  headaches  joint pain  muscle cramps  muscle pain  nausea, vomiting  pain, redness, or irritation at site where injected This list may not describe all possible side effects. Call your doctor for medical advice about side effects. You may report side effects to FDA at 1-800-FDA-1088. Where should I keep my medicine? Keep out of the reach of children. Store in a refrigerator between 2 and 8 degrees C (36 and 46 degrees F). Do not freeze or shake. Throw away any unused portion if using a single-dose vial. Multi-dose vials can be kept in the refrigerator for up to 21 days after the initial dose. Throw away unused medicine. NOTE: This sheet is a summary. It may not cover all possible information. If you have questions about this medicine, talk to your doctor, pharmacist, or health care provider.  2020 Elsevier/Gold Standard (2016-08-21 08:35:19)

## 2019-09-20 ENCOUNTER — Ambulatory Visit: Payer: Medicare Other | Admitting: Hematology and Oncology

## 2019-09-20 ENCOUNTER — Ambulatory Visit: Payer: Medicare Other | Admitting: Gastroenterology

## 2019-09-20 ENCOUNTER — Other Ambulatory Visit: Payer: Self-pay

## 2019-09-20 ENCOUNTER — Encounter (HOSPITAL_COMMUNITY): Payer: Self-pay | Admitting: Hematology and Oncology

## 2019-09-20 ENCOUNTER — Encounter: Payer: Self-pay | Admitting: Gastroenterology

## 2019-09-20 VITALS — BP 133/62 | HR 77 | Temp 98.9°F | Ht 71.0 in | Wt 255.5 lb

## 2019-09-20 DIAGNOSIS — R195 Other fecal abnormalities: Secondary | ICD-10-CM

## 2019-09-20 DIAGNOSIS — D539 Nutritional anemia, unspecified: Secondary | ICD-10-CM

## 2019-09-20 MED ORDER — NA SULFATE-K SULFATE-MG SULF 17.5-3.13-1.6 GM/177ML PO SOLN
354.0000 mL | Freq: Once | ORAL | 0 refills | Status: AC
Start: 2019-09-20 — End: 2019-09-20

## 2019-09-20 NOTE — Patient Instructions (Signed)
Your ultrasound is scheduled at [facility] surgery center on 09/25/2019 arrive at 10:00. Nothing to eat or drink after midnight

## 2019-09-20 NOTE — Progress Notes (Signed)
Brett Darby, MD [address]  [place]  [place], [place] [address]  Main: [phone]  Fax: [phone]    Gastroenterology Consultation  Referring Provider:     [name], MD Primary Care Physician:  [name], MD Primary Gastroenterologist:  Dr. Cephas Boone Reason for Consultation:     Occult positive stool        HPI:   Brett Boone is a 74 y.o. male referred by Dr. [name], MD  for consultation & management of occult positive stool  Mr. Losasso is a 74 year old male with new diagnosis of macrocytic anemia, hemoglobin 9.8, MCV 107 on a routine physical by his PCP.  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  He has normal B12 and folate levels.  He underwent further work-up by Dr. Mike Gip, s/p bone marrow biopsy which was normal.  Cytogenetic results came back normal.  Patient does not have chronic liver disease, hepatitis C virus antibody is negative.  He does not have thrombocytopenia.  Patient underwent fecal occult blood testing which was positive x 2 out of 3  However, patient denies any GI symptoms including altered bowel habits or change in stool caliber or rectal bleeding  NSAIDs: None  Antiplts/Anticoagulants/Anti thrombotics: None  GI Procedures: Colonoscopy 02/17/2017 - Preparation of the colon was fair. - One 4 mm polyp in the cecum, removed with a cold biopsy forceps. Resected and retrieved. - One 12 mm polyp in the cecum, removed with a hot snare. Resected and retrieved. - One 5 mm polyp in the cecum, removed with a cold snare. Resected and retrieved. - Two 8 to 10 mm polyps in the transverse colon, removed with a hot snare. Resected and retrieved. - One 5 mm polyp in the descending colon, removed with a cold snare. Resected and retrieved. - One 8 mm polyp in the rectum, removed with a hot snare. Resected and retrieved. - The distal rectum and anal verge are  normal on retroflexion view.    Past Medical History:  Diagnosis Date  . Venous stasis dermatitis of both lower extremities     Past Surgical History:  Procedure Laterality Date  . CATARACT EXTRACTION W/PHACO Right 03/09/2017   Procedure: CATARACT EXTRACTION PHACO AND INTRAOCULAR LENS PLACEMENT ([place]) RIGHT;  Surgeon: Eulogio Bear, MD;  Location: [facility];  Service: Ophthalmology;  Laterality: Right;  . COLONOSCOPY  2013   normal- cleared for 5 yrs- Dr Beckey Downing  . COLONOSCOPY WITH PROPOFOL N/A 02/17/2017   Procedure: COLONOSCOPY WITH PROPOFOL;  Surgeon: Lin Landsman, MD;  Location: [facility];  Service: Endoscopy;  Laterality: N/A;  . EPIGASTRIC HERNIA REPAIR N/A 07/06/2014   Procedure: HERNIA REPAIR EPIGASTRIC ADULT;  Surgeon: Molly Maduro, MD;  Location: [facility];  Service: General;  Laterality: N/A;  . HERNIA REPAIR Bilateral   . INSERTION OF MESH N/A 07/06/2014   Procedure: INSERTION OF MESH;  Surgeon: Molly Maduro, MD;  Location: [facility];  Service: General;  Laterality: N/A;  . POLYPECTOMY  02/17/2017   Procedure: POLYPECTOMY;  Surgeon: Lin Landsman, MD;  Location: [facility];  Service: Endoscopy;;  . SHOULDER ARTHROSCOPY Left 2008  . SINUS EXPLORATION      Current Outpatient Medications:  .  tadalafil (CIALIS) 5 MG tablet, Take 5 mg by mouth daily as needed for erectile dysfunction. , Disp: , Rfl:  .  Na Sulfate-K Sulfate-Mg Sulf  17.5-3.13-1.6 GM/177ML SOLN, Take 354 mLs by mouth once for 1 dose., Disp: 354 mL, Rfl: 0   Family History  Problem Relation Age of Onset  . Diabetes Father   . Heart disease Father 110  . Stroke Mother      Social History   Tobacco Use  . Smoking status: Former Smoker    Packs/day: 0.25    Years: 50.00    Pack years: 12.50    Types: Cigarettes    Quit date: 05/03/2018    Years since quitting: 1.3  . Smokeless tobacco: Former Systems developer  . Tobacco comment: pt quit smoking  Vaping Use  .  Vaping Use: Never used  Substance Use Topics  . Alcohol use: Yes    Comment: Occasional -1x/month  . Drug use: No    Allergies as of 09/20/2019 - Review Complete 09/20/2019  Allergen Reaction Noted  . Amoxicillin-pot clavulanate Hives and Nausea And Vomiting 06/21/2014    Review of Systems:    All systems reviewed and negative except where noted in HPI.   Physical Exam:  BP 133/62 (BP Location: Left Arm, Patient Position: Sitting, Cuff Size: Normal)   Pulse 77   Temp 98.9 F (37.2 C) (Oral)   Ht 5' 11"  (1.803 m)   Wt 255 lb 8 oz (115.9 kg)   BMI 35.64 kg/m  No LMP for male patient.  General:   Alert,  Well-developed, well-nourished, pleasant and cooperative in NAD Head:  Normocephalic and atraumatic. Eyes:  Sclera clear, no icterus.   Conjunctiva pink. Ears:  Normal auditory acuity. Nose:  No deformity, discharge, or lesions. Mouth:  No deformity or lesions,oropharynx pink & moist. Neck:  Supple; no masses or thyromegaly. Lungs:  Respirations even and unlabored.  Clear throughout to auscultation.   No wheezes, crackles, or rhonchi. No acute distress. Heart:  Regular rate and rhythm; no murmurs, clicks, rubs, or gallops. Abdomen:  Normal bowel sounds. Soft, obese, non-tender and non-distended without masses, hepatosplenomegaly or hernias noted.  No guarding or rebound tenderness.   Rectal: Not performed Msk:  Symmetrical without gross deformities. Good, equal movement & strength bilaterally. Pulses:  Normal pulses noted. Extremities:  No clubbing, 1+ edema.  No cyanosis. Neurologic:  Alert and oriented x3;  grossly normal neurologically. Skin:  Intact without significant lesions or rashes. No jaundice. Psych:  Alert and cooperative. Normal mood and affect.  Imaging Studies: No abdominal imaging  Assessment and Plan:   Brett Boone is a 74 y.o. 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  The patient agrees with this plan & written consent will be obtained.    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

## 2019-09-21 ENCOUNTER — Ambulatory Visit: Payer: Medicare Other | Admitting: Hematology and Oncology

## 2019-09-21 LAB — ERYTHROPOIETIN: Erythropoietin: 63.3 m[IU]/mL — ABNORMAL HIGH (ref 2.6–18.5)

## 2019-09-25 ENCOUNTER — Ambulatory Visit
Admission: RE | Admit: 2019-09-25 | Discharge: 2019-09-25 | Disposition: A | Discharge status: Home / Self Care | Payer: Medicare Other | Source: Ambulatory Visit | Attending: Gastroenterology | Admitting: Gastroenterology

## 2019-09-25 ENCOUNTER — Other Ambulatory Visit: Payer: Self-pay

## 2019-09-25 ENCOUNTER — Encounter (HOSPITAL_COMMUNITY): Payer: Self-pay | Admitting: Hematology and Oncology

## 2019-09-25 DIAGNOSIS — K8020 Calculus of gallbladder without cholecystitis without obstruction: Secondary | ICD-10-CM | POA: Diagnosis not present

## 2019-09-25 DIAGNOSIS — R195 Other fecal abnormalities: Secondary | ICD-10-CM | POA: Diagnosis not present

## 2019-09-25 DIAGNOSIS — D539 Nutritional anemia, unspecified: Secondary | ICD-10-CM | POA: Insufficient documentation

## 2019-09-25 DIAGNOSIS — R161 Splenomegaly, not elsewhere classified: Secondary | ICD-10-CM | POA: Diagnosis not present

## 2019-09-25 DIAGNOSIS — Z148 Genetic carrier of other disease: Secondary | ICD-10-CM | POA: Insufficient documentation

## 2019-09-25 DIAGNOSIS — D461 Refractory anemia with ring sideroblasts: Secondary | ICD-10-CM | POA: Insufficient documentation

## 2019-09-25 IMAGING — US US ABDOMEN COMPLETE
1 series · 13 of 25 positions shown · non-contrast
Comparison: CT chest [DATE].

CLINICAL DATA: Anemia.

EXAM:
ABDOMEN ULTRASOUND COMPLETE

[Series 1: us abdomen complete · 0.28mm/px · 13 of 117 slices shown]
[im 1/117]
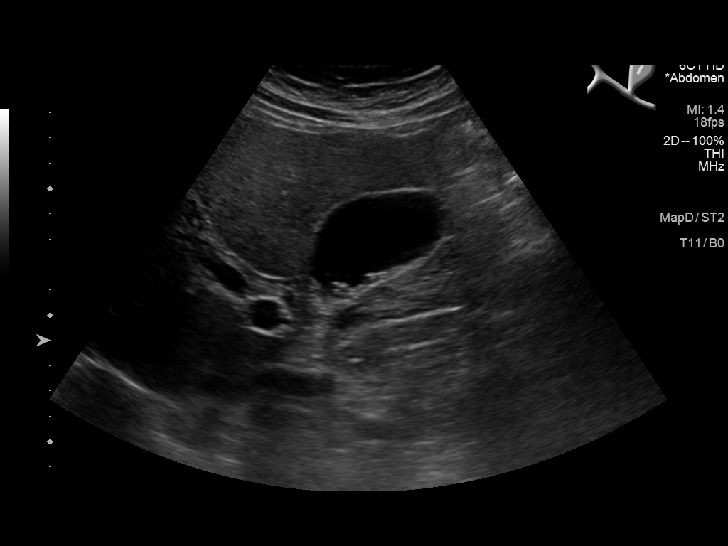
[im 10/117]
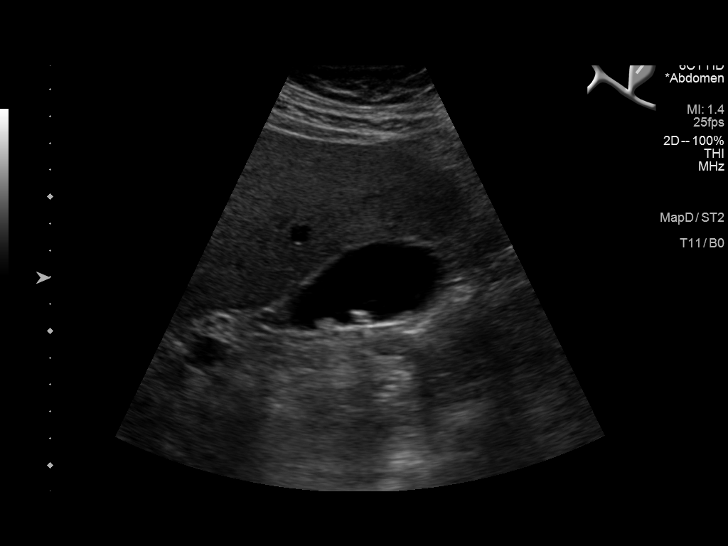
[im 20/117]
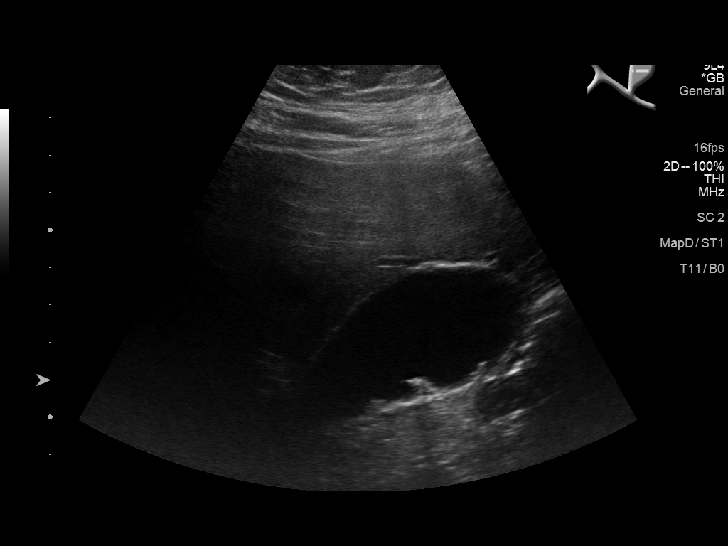
[im 30/117]
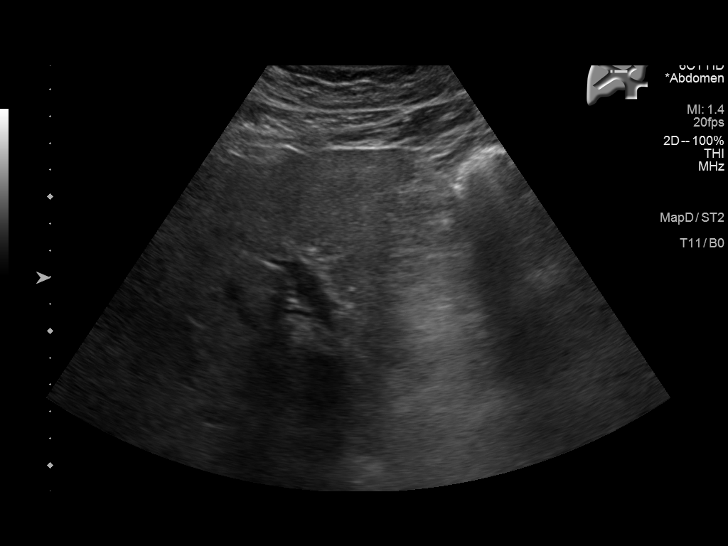
[im 39/117]
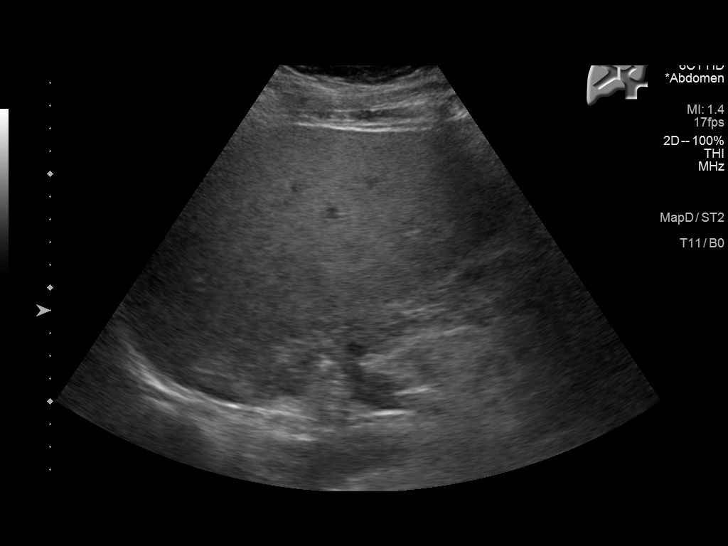
[im 49/117]
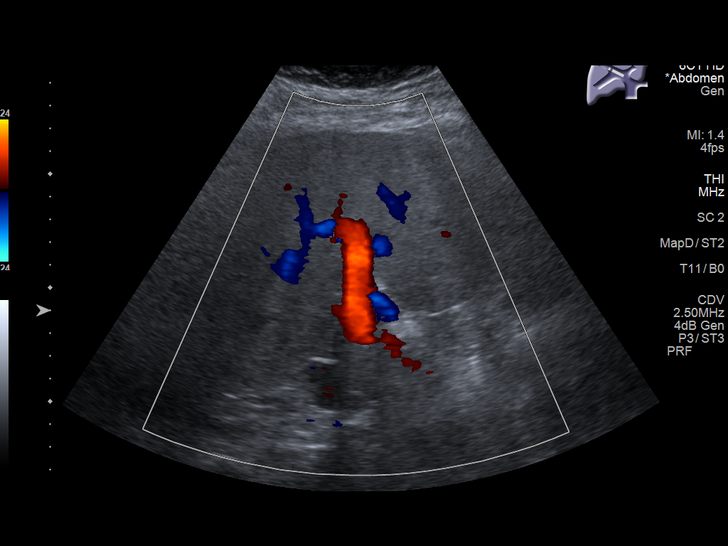
[im 59/117]
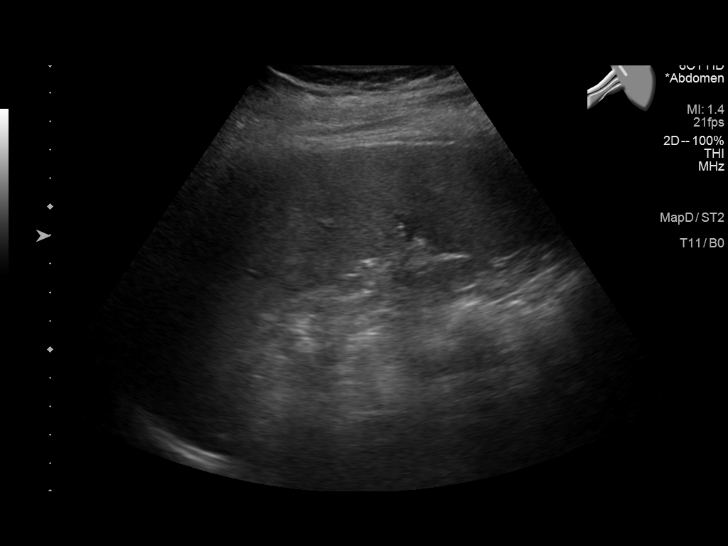
[im 68/117]
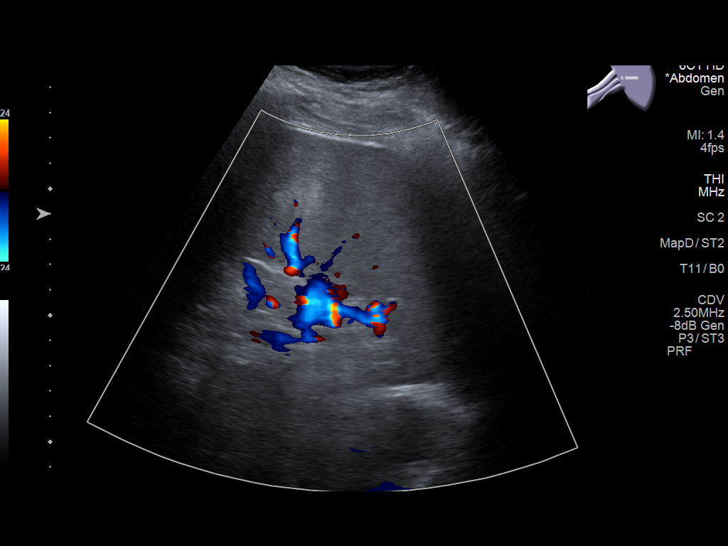
[im 78/117]
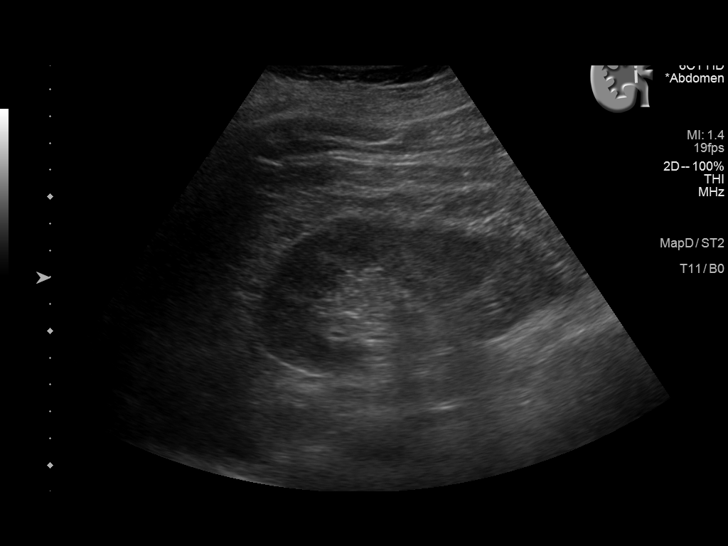
[im 88/117]
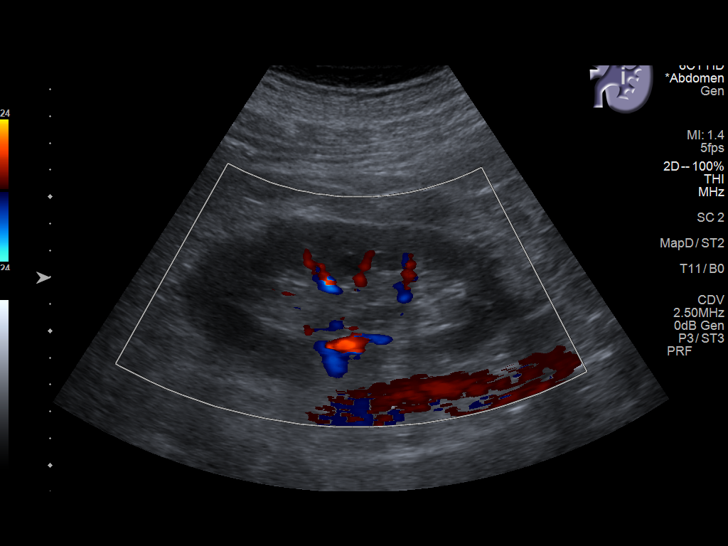
[im 97/117]
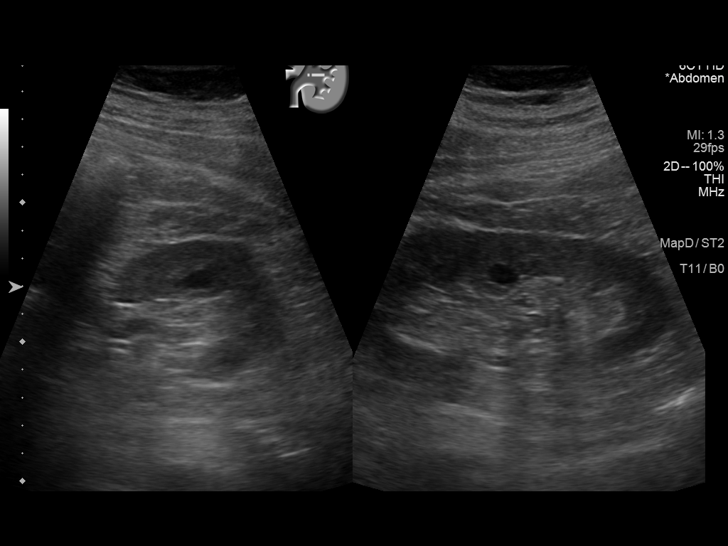
[im 107/117]
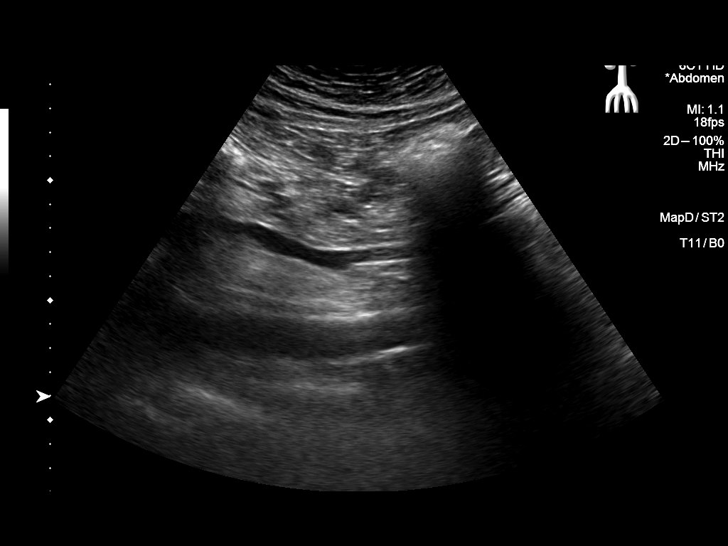
[im 117/117]
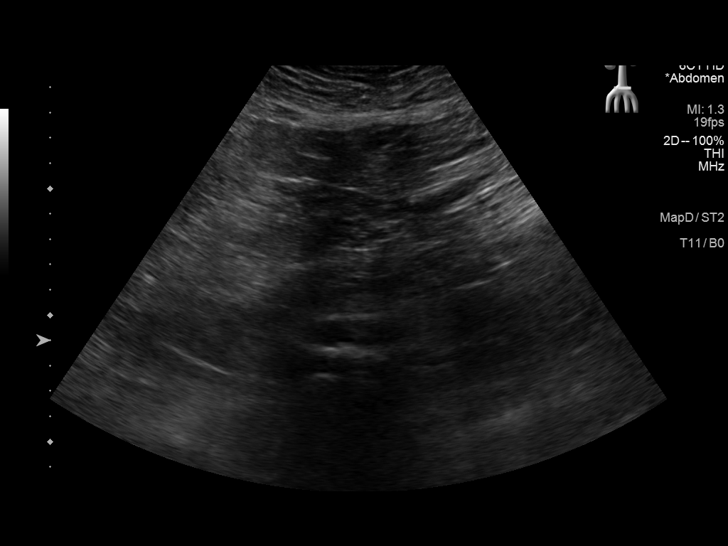

[13 of 25 positions shown; findings below may reference images not displayed]

FINDINGS: Gallbladder: A few mobile stones measuring up to approximately
cm are identified. No wall thickening or pericholecystic fluid.
Sonographer reports negative Murphy's sign.

Common bile duct: Diameter: 0.5 cm

Liver: Echogenicity is normal. No focal lesion. Portal vein is
patent on color Doppler imaging with normal direction of blood flow
towards the liver.

IVC: No abnormality visualized.

Pancreas: Visualized portion unremarkable.

Spleen: Measures 16.5 cm. A hyperechoic lesion in the spleen
measuring 2.6 cm in diameter is likely hemangioma.

Right Kidney: Length: 12.0 cm. Echogenicity within normal limits.
No mass or hydronephrosis visualized.

Left Kidney: Length: 12.8 cm. Echogenicity within normal limits. No
solid mass or hydronephrosis visualized. A few small cysts measuring
up to 1.2 cm noted.

Abdominal aorta: No aneurysm visualized.

Other findings: None.
IMPRESSION: 2.6 cm hyperechoic lesion in the spleen is likely a benign lesion
such as an angioma but MRI with and without contrast is recommended
for further evaluation.

Mild splenomegaly.

Few small gallstones without evidence of cholecystitis.

## 2019-09-26 ENCOUNTER — Telehealth: Payer: Self-pay

## 2019-09-26 ENCOUNTER — Encounter: Payer: Self-pay | Admitting: Hematology and Oncology

## 2019-09-26 ENCOUNTER — Encounter (HOSPITAL_COMMUNITY): Payer: Self-pay | Admitting: Hematology and Oncology

## 2019-09-26 DIAGNOSIS — R161 Splenomegaly, not elsewhere classified: Secondary | ICD-10-CM

## 2019-09-26 NOTE — Telephone Encounter (Signed)
Patient verbalized understanding. He states he can not do do 09/02-09/07/2019.  Called and got patient scheduled for 10/16/2019 8:00am arrived 7:30am nothing to eat or drink 4 hours prior. Informed patient of this information

## 2019-09-26 NOTE — Telephone Encounter (Signed)
-----   Message from Lin Landsman, MD sent at 09/26/2019  4:45 PM EDT ----- Ultrasound of the abdomen reveals mildly enlarged spleen and possible lesion in the spleen.  Recommend MRI abdomen with and without contrast  Rohini Vanga

## 2019-10-04 ENCOUNTER — Encounter: Payer: Self-pay | Admitting: Gastroenterology

## 2019-10-04 LAB — SURGICAL PATHOLOGY

## 2019-10-10 ENCOUNTER — Other Ambulatory Visit: Payer: Self-pay

## 2019-10-10 ENCOUNTER — Other Ambulatory Visit
Admission: RE | Admit: 2019-10-10 | Discharge: 2019-10-10 | Disposition: A | Discharge status: Home / Self Care | Payer: Medicare Other | Source: Ambulatory Visit | Attending: Gastroenterology | Admitting: Gastroenterology

## 2019-10-10 DIAGNOSIS — Z20822 Contact with and (suspected) exposure to covid-19: Secondary | ICD-10-CM | POA: Diagnosis not present

## 2019-10-10 DIAGNOSIS — Z01812 Encounter for preprocedural laboratory examination: Secondary | ICD-10-CM | POA: Insufficient documentation

## 2019-10-11 ENCOUNTER — Other Ambulatory Visit: Payer: Self-pay | Admitting: Hematology and Oncology

## 2019-10-11 DIAGNOSIS — L821 Other seborrheic keratosis: Secondary | ICD-10-CM | POA: Diagnosis not present

## 2019-10-11 LAB — SARS CORONAVIRUS 2 (TAT 6-24 HRS): SARS Coronavirus 2: NEGATIVE

## 2019-10-11 NOTE — Discharge Instructions (Signed)
General Anesthesia, Adult, Care After This sheet gives you information about how to care for yourself after your procedure. Your health care provider may also give you more specific instructions. If you have problems or questions, contact your health care provider. What can I expect after the procedure? After the procedure, the following side effects are common:  Pain or discomfort at the IV site.  Nausea.  Vomiting.  Sore throat.  Trouble concentrating.  Feeling cold or chills.  Weak or tired.  Sleepiness and fatigue.  Soreness and body aches. These side effects can affect parts of the body that were not involved in surgery. Follow these instructions at home:  For at least 24 hours after the procedure:  Have a responsible adult stay with you. It is important to have someone help care for you until you are awake and alert.  Rest as needed.  Do not: ? Participate in activities in which you could fall or become injured. ? Drive. ? Use heavy machinery. ? Drink alcohol. ? Take sleeping pills or medicines that cause drowsiness. ? Make important decisions or sign legal documents. ? Take care of children on your own. Eating and drinking  Follow any instructions from your health care provider about eating or drinking restrictions.  When you feel hungry, start by eating small amounts of foods that are soft and easy to digest (bland), such as toast. Gradually return to your regular diet.  Drink enough fluid to keep your urine pale yellow.  If you vomit, rehydrate by drinking water, juice, or clear broth. General instructions  If you have sleep apnea, surgery and certain medicines can increase your risk for breathing problems. Follow instructions from your health care provider about wearing your sleep device: ? Anytime you are sleeping, including during daytime naps. ? While taking prescription pain medicines, sleeping medicines, or medicines that make you drowsy.  Return to  your normal activities as told by your health care provider. Ask your health care provider what activities are safe for you.  Take over-the-counter and prescription medicines only as told by your health care provider.  If you smoke, do not smoke without supervision.  Keep all follow-up visits as told by your health care provider. This is important. Contact a health care provider if:  You have nausea or vomiting that does not get better with medicine.  You cannot eat or drink without vomiting.  You have pain that does not get better with medicine.  You are unable to pass urine.  You develop a skin rash.  You have a fever.  You have redness around your IV site that gets worse. Get help right away if:  You have difficulty breathing.  You have chest pain.  You have blood in your urine or stool, or you vomit blood. Summary  After the procedure, it is common to have a sore throat or nausea. It is also common to feel tired.  Have a responsible adult stay with you for the first 24 hours after general anesthesia. It is important to have someone help care for you until you are awake and alert.  When you feel hungry, start by eating small amounts of foods that are soft and easy to digest (bland), such as toast. Gradually return to your regular diet.  Drink enough fluid to keep your urine pale yellow.  Return to your normal activities as told by your health care provider. Ask your health care provider what activities are safe for you. This information is not   intended to replace advice given to you by your health care provider. Make sure you discuss any questions you have with your health care provider. Document Revised: 01/15/2017 Document Reviewed: 08/28/2016 Elsevier Patient Education  2020 [employer]vier Inc[employer]  

## 2019-10-11 NOTE — Anesthesia Preprocedure Evaluation (Addendum)
Anesthesia Evaluation  Patient identified by MRN, date of birth, ID band Patient awake    Reviewed: Allergy & Precautions, NPO status , Patient's Chart, lab work & pertinent test results, reviewed documented beta blocker date and time   History of Anesthesia Complications Negative for: history of anesthetic complications  Airway Mallampati: III  TM Distance: >3 FB Neck ROM: Full    Dental   Pulmonary former smoker,    breath sounds clear to auscultation       Cardiovascular (-) angina(-) DOE  Rhythm:Regular Rate:Normal   TTE (2016): NORMAL LEFT VENTRICULAR SYSTOLIC FUNCTION WITH MILD LVH  MILD AI, TR, PR NO VALVULAR STENOSIS  MILD PHTN    Neuro/Psych    GI/Hepatic neg GERD  ,  Endo/Other    Renal/GU      Musculoskeletal   Abdominal (+) + obese (BMI 36),   Peds  Hematology  (+) anemia ,   Anesthesia Other Findings Myelodysplastic syndrome with ring sideroblasts  Reproductive/Obstetrics                            Anesthesia Physical Anesthesia Plan  ASA: II  Anesthesia Plan: General   Post-op Pain Management:    Induction: Intravenous  PONV Risk Score and Plan: 2 and Propofol infusion, TIVA and Treatment may vary due to age or medical condition  Airway Management Planned: Natural Airway and Nasal Cannula  Additional Equipment:   Intra-op Plan:   Post-operative Plan:   Informed Consent: I have reviewed the patients History and Physical, chart, labs and discussed the procedure including the risks, benefits and alternatives for the proposed anesthesia with the patient or authorized representative who has indicated his/her understanding and acceptance.       Plan Discussed with: CRNA and Anesthesiologist  Anesthesia Plan Comments:         Anesthesia Quick Evaluation

## 2019-10-11 NOTE — Progress Notes (Signed)
START ON PATHWAY REGIMEN - MDS     A cycle is every two weeks:     Darbepoetin alfa   **Always confirm dose/schedule in your pharmacy ordering system**  Patient Characteristics: Lower-Risk (IPSS-R Score ? 3.5), MDS-Ring Sideroblasts, First Line, Anemia Requiring Treatment, EPO Level ? 200 WHO Disease Classification: MDS-RS-SLD Bone Marrow Blasts (percent): ? 2% Cytogenetic Category: Very Good Platelets (x 10^9/L): ? 100 Absolute Neutrophil Count (x 10^9/L): ? 0.8 Line of Therapy: First Line IPSS-R Risk Category: Very Low IPSS-R Risk Score: 1 Check here if patient's risk score was calculated prior to the International Prognostic Scoring System-Revised (IPSS-R): false Hemoglobin (g/dl): 8 to < 10 Disease Characteristics: Anemia Requiring Treatment Patient Characteristics: EPO Level ? 200 Intent of Therapy: Non-Curative / Palliative Intent, Discussed with Patient

## 2019-10-12 ENCOUNTER — Ambulatory Visit: Payer: Medicare Other | Admitting: Anesthesiology

## 2019-10-12 ENCOUNTER — Other Ambulatory Visit: Payer: Self-pay

## 2019-10-12 ENCOUNTER — Encounter
Admission: RE | Disposition: A | Discharge status: Home / Self Care | Payer: Self-pay | Source: Home / Self Care | Attending: Gastroenterology

## 2019-10-12 ENCOUNTER — Encounter: Payer: Self-pay | Admitting: Gastroenterology

## 2019-10-12 ENCOUNTER — Ambulatory Visit
Admission: RE | Admit: 2019-10-12 | Discharge: 2019-10-12 | Disposition: A | Discharge status: Home / Self Care | Payer: Medicare Other | Attending: Gastroenterology | Admitting: Gastroenterology

## 2019-10-12 DIAGNOSIS — D120 Benign neoplasm of cecum: Secondary | ICD-10-CM | POA: Diagnosis not present

## 2019-10-12 DIAGNOSIS — Z8249 Family history of ischemic heart disease and other diseases of the circulatory system: Secondary | ICD-10-CM | POA: Diagnosis not present

## 2019-10-12 DIAGNOSIS — Z87891 Personal history of nicotine dependence: Secondary | ICD-10-CM | POA: Insufficient documentation

## 2019-10-12 DIAGNOSIS — K5730 Diverticulosis of large intestine without perforation or abscess without bleeding: Secondary | ICD-10-CM | POA: Diagnosis not present

## 2019-10-12 DIAGNOSIS — K644 Residual hemorrhoidal skin tags: Secondary | ICD-10-CM | POA: Diagnosis not present

## 2019-10-12 DIAGNOSIS — K621 Rectal polyp: Secondary | ICD-10-CM | POA: Diagnosis not present

## 2019-10-12 DIAGNOSIS — D122 Benign neoplasm of ascending colon: Secondary | ICD-10-CM | POA: Insufficient documentation

## 2019-10-12 DIAGNOSIS — Z880 Allergy status to penicillin: Secondary | ICD-10-CM | POA: Diagnosis not present

## 2019-10-12 DIAGNOSIS — R195 Other fecal abnormalities: Secondary | ICD-10-CM | POA: Diagnosis not present

## 2019-10-12 DIAGNOSIS — K635 Polyp of colon: Secondary | ICD-10-CM | POA: Diagnosis not present

## 2019-10-12 HISTORY — PX: POLYPECTOMY: SHX5525

## 2019-10-12 HISTORY — PX: COLONOSCOPY WITH PROPOFOL: SHX5780

## 2019-10-12 HISTORY — DX: Anemia, unspecified: D64.9

## 2019-10-12 SURGERY — COLONOSCOPY WITH PROPOFOL
Anesthesia: General | Site: Rectum

## 2019-10-12 MED ORDER — PROPOFOL 10 MG/ML IV BOLUS
INTRAVENOUS | Status: DC | PRN
  Administered 2019-10-12: 20 mg via INTRAVENOUS
  Administered 2019-10-12: 40 mg via INTRAVENOUS
  Administered 2019-10-12 (×2): 20 mg via INTRAVENOUS
  Administered 2019-10-12 (×2): 40 mg via INTRAVENOUS
  Administered 2019-10-12: 180 mg via INTRAVENOUS
  Administered 2019-10-12: 20 mg via INTRAVENOUS
  Administered 2019-10-12: 40 mg via INTRAVENOUS

## 2019-10-12 MED ORDER — SODIUM CHLORIDE 0.9 % IV SOLN: INTRAVENOUS | Status: DC

## 2019-10-12 MED ORDER — STERILE WATER FOR IRRIGATION IR SOLN: Status: DC | PRN

## 2019-10-12 MED ORDER — ONDANSETRON HCL 4 MG/2ML IJ SOLN: 4.0000 mg | Freq: Once | INTRAMUSCULAR | Status: DC | PRN

## 2019-10-12 MED ORDER — LACTATED RINGERS IV SOLN: INTRAVENOUS | Status: DC

## 2019-10-12 MED ORDER — ACETAMINOPHEN 10 MG/ML IV SOLN: 1000.0000 mg | Freq: Once | INTRAVENOUS | Status: DC | PRN

## 2019-10-12 MED ORDER — LIDOCAINE HCL (CARDIAC) PF 100 MG/5ML IV SOSY
PREFILLED_SYRINGE | INTRAVENOUS | Status: DC | PRN
  Administered 2019-10-12: 30 mg via INTRAVENOUS

## 2019-10-12 SURGICAL SUPPLY — 7 items
GOWN CVR UNV OPN BCK APRN NK (MISCELLANEOUS) ×2 IMPLANT
GOWN ISOL THUMB LOOP REG UNIV (MISCELLANEOUS) ×4
KIT ENDO PROCEDURE OLY (KITS) ×2 IMPLANT
MANIFOLD NEPTUNE II (INSTRUMENTS) ×2 IMPLANT
SNARE COLD EXACTO (MISCELLANEOUS) ×2 IMPLANT
TRAP ETRAP POLY (MISCELLANEOUS) ×2 IMPLANT
WATER STERILE IRR 250ML POUR (IV SOLUTION) ×2 IMPLANT

## 2019-10-12 NOTE — H&P (Signed)
[name], MD [address]  [place]  [place], [place] [address]  Main: [phone]  Fax: [phone] Pager: [phone]  Primary Care Physician:  [name], MD Primary Gastroenterologist:  Dr. [name]  Pre-Procedure History & Physical: HPI:  Brett Boone is a 74 y.o. male is here for an colonoscopy.   Past Medical History:  Diagnosis Date  . Anemia   . Heart murmur   . Venous stasis dermatitis of both lower extremities     Past Surgical History:  Procedure Laterality Date  . CATARACT EXTRACTION W/PHACO Right 03/09/2017   Procedure: CATARACT EXTRACTION PHACO AND INTRAOCULAR LENS PLACEMENT ([place]) RIGHT;  Surgeon: Eulogio Bear, MD;  Location: [facility];  Service: Ophthalmology;  Laterality: Right;  . COLONOSCOPY  2013   normal- cleared for 5 yrs- Dr Beckey Downing  . COLONOSCOPY WITH PROPOFOL N/A 02/17/2017   Procedure: COLONOSCOPY WITH PROPOFOL;  Surgeon: Lin Landsman, MD;  Location: [facility];  Service: Endoscopy;  Laterality: N/A;  . EPIGASTRIC HERNIA REPAIR N/A 07/06/2014   Procedure: HERNIA REPAIR EPIGASTRIC ADULT;  Surgeon: Molly Maduro, MD;  Location: [facility];  Service: General;  Laterality: N/A;  . HERNIA REPAIR Bilateral   . INSERTION OF MESH N/A 07/06/2014   Procedure: INSERTION OF MESH;  Surgeon: Molly Maduro, MD;  Location: [facility];  Service: General;  Laterality: N/A;  . POLYPECTOMY  02/17/2017   Procedure: POLYPECTOMY;  Surgeon: Lin Landsman, MD;  Location: [facility];  Service: Endoscopy;;  . SHOULDER ARTHROSCOPY Left 2008  . SINUS EXPLORATION      Prior to Admission medications   Medication Sig Start Date End Date Taking? Authorizing Provider  tadalafil (CIALIS) 5 MG tablet Take 5 mg by mouth daily as needed for erectile dysfunction.  06/27/19   Billey Co, MD    Allergies as of 09/20/2019 - Review Complete 09/20/2019  Allergen Reaction Noted  . Amoxicillin-pot  clavulanate Hives and Nausea And Vomiting 06/21/2014    Family History  Problem Relation Age of Onset  . Diabetes Father   . Heart disease Father 94  . Stroke Mother     Social History   Socioeconomic History  . Marital status: Married    Spouse name: Not on file  . Number of children: 3  . Years of education: Not on file  . Highest education level: Bachelor's degree (e.g., BA, AB, BS)  Occupational History  . Occupation: Retired  Tobacco Use  . Smoking status: Former Smoker    Packs/day: 0.25    Years: 50.00    Pack years: 12.50    Types: Cigarettes    Quit date: 05/03/2018    Years since quitting: 1.4  . Smokeless tobacco: Former Systems developer  . Tobacco comment: pt quit smoking  Vaping Use  . Vaping Use: Never used  Substance and Sexual Activity  . Alcohol use: Yes    Comment: Occasional -1x/month  . Drug use: No  . Sexual activity: Yes  Other Topics Concern  . Not on file  Social History Narrative  . Not on file   Social Determinants of Health   Financial Resource Strain: Low Risk   . Difficulty of Paying Living Expenses: Not hard at all  Food Insecurity: No Food Insecurity  . Worried About Charity fundraiser in the Last Year: Never true  . Ran Out of Food in the Last Year: Never true  Transportation Needs: No Transportation Needs  . Lack  of Transportation (Medical): No  . Lack of Transportation (Non-Medical): No  Physical Activity: Inactive  . Days of Exercise per Week: 0 days  . Minutes of Exercise per Session: 0 min  Stress: No Stress Concern Present  . Feeling of Stress : Only a little  Social Connections: Moderately Integrated  . Frequency of Communication with Friends and Family: More than three times a week  . Frequency of Social Gatherings with Friends and Family: Once a week  . Attends Religious Services: More than 4 times per year  . Active Member of Clubs or Organizations: No  . Attends Archivist Meetings: Never  . Marital Status: Married   Human resources officer Violence: Not At Risk  . Fear of Current or Ex-Partner: No  . Emotionally Abused: No  . Physically Abused: No  . Sexually Abused: No    Review of Systems: See HPI, otherwise negative ROS  Physical Exam: BP 127/75   Pulse 71   Temp (!) 97.5 F (36.4 C)   Ht 5\' 11"  (1.803 m)   Wt 114.7 kg   SpO2 99%   BMI 35.27 kg/m  General:   Alert,  pleasant and cooperative in NAD Head:  Normocephalic and atraumatic. Neck:  Supple; no masses or thyromegaly. Lungs:  Clear throughout to auscultation.    Heart:  Regular rate and rhythm. Abdomen:  Soft, nontender and nondistended. Normal bowel sounds, without guarding, and without rebound.   Neurologic:  Alert and  oriented x4;  grossly normal neurologically.  Impression/Plan: Brett Boone is here for an colonoscopy to be performed for occult positive stool  Risks, benefits, limitations, and alternatives regarding  colonoscopy have been reviewed with the patient.  Questions have been answered.  All parties agreeable.   [name], MD  10/12/2019, 8:04 AM

## 2019-10-12 NOTE — Anesthesia Postprocedure Evaluation (Signed)
Anesthesia Post Note  Patient: Brett Boone  Procedure(s) Performed: COLONOSCOPY WITH BIOPSY (N/A Rectum) POLYPECTOMY (N/A Rectum)     Patient location during evaluation: PACU Anesthesia Type: General Level of consciousness: awake and alert Pain management: pain level controlled Vital Signs Assessment: post-procedure vital signs reviewed and stable Respiratory status: spontaneous breathing, nonlabored ventilation, respiratory function stable and patient connected to nasal cannula oxygen Cardiovascular status: blood pressure returned to baseline and stable Postop Assessment: no apparent nausea or vomiting Anesthetic complications: no   No complications documented.  Evert Wenrich A  Deontrey Massi

## 2019-10-12 NOTE — Op Note (Addendum)
[facility] Gastroenterology Patient Name: Brett Boone Procedure Date: 10/12/2019 8:09 AM MRN: [id number] Account #: [id number] Date of Birth: 02-Jul-1945 Admit Type: Outpatient Age: 74 Room: [facility] OR ROOM 01 Gender: Male Note Status: Finalized Procedure:             Colonoscopy Indications:           Last colonoscopy: January 2019, Positive fecal                         immunochemical test Providers:             Lin Landsman MD, MD Referring MD:          [name], MD (Referring MD) Medicines:             Monitored Anesthesia Care Complications:         No immediate complications. Estimated blood loss: None. Procedure:             Pre-Anesthesia Assessment:                        - Prior to the procedure, a History and Physical was                         performed, and patient medications and allergies were                         reviewed. The patient is competent. The risks and                         benefits of the procedure and the sedation options and                         risks were discussed with the patient. All questions                         were answered and informed consent was obtained.                         Patient identification and proposed procedure were                         verified by the physician, the nurse, the                         anesthesiologist, the anesthetist and the technician                         in the pre-procedure area in the procedure room in the                         endoscopy suite. Mental Status Examination: alert and                         oriented. Airway Examination: normal oropharyngeal                         airway and neck mobility. Respiratory Examination:  clear to auscultation. CV Examination: normal.                         Prophylactic Antibiotics: The patient does not require                         prophylactic antibiotics. Prior Anticoagulants: The                          patient has taken no previous anticoagulant or                         antiplatelet agents. ASA Grade Assessment: II - A                         patient with mild systemic disease. After reviewing                         the risks and benefits, the patient was deemed in                         satisfactory condition to undergo the procedure. The                         anesthesia plan was to use monitored anesthesia care                         (MAC). Immediately prior to administration of                         medications, the patient was re-assessed for adequacy                         to receive sedatives. The heart rate, respiratory                         rate, oxygen saturations, blood pressure, adequacy of                         pulmonary ventilation, and response to care were                         monitored throughout the procedure. The physical                         status of the patient was re-assessed after the                         procedure.                        After obtaining informed consent, the colonoscope was                         passed under direct vision. Throughout the procedure,                         the patient's blood pressure, pulse, and oxygen  saturations were monitored continuously. The was                         introduced through the anus and advanced to the the                         cecum, identified by appendiceal orifice and ileocecal                         valve. The colonoscopy was performed without                         difficulty. The patient tolerated the procedure well.                         The quality of the bowel preparation was evaluated                         using the BBPS [facility] Bowel Preparation Scale) with                         scores of: Right Colon = 3, Transverse Colon = 3 and                         Left Colon = 3 (entire mucosa seen well with no                          residual staining, small fragments of stool or opaque                         liquid). The total BBPS score equals 9. Findings:      The perianal and digital rectal examinations were normal. Pertinent       negatives include normal sphincter tone and no palpable rectal lesions.      Nine sessile polyps were found in the rectum, ascending colon and cecum.       The polyps were 3 to 7 mm in size. These polyps were removed with a cold       snare. Resection and retrieval were complete. Estimated blood loss: none.      A few diverticula were found in the sigmoid colon.      Non-bleeding external hemorrhoids were found during retroflexion. The       hemorrhoids were large. Impression:            - Nine 3 to 7 mm polyps in the rectum 3, in the                         ascending colon 5 and in the cecum 1, removed with a                         cold snare. Resected and retrieved.                        - Diverticulosis in the sigmoid colon.                        - Non-bleeding external hemorrhoids. Recommendation:        -  Discharge patient to home (with escort).                        - Resume previous diet today.                        - Continue present medications.                        - Await pathology results.                        - Repeat colonoscopy in 3 years for surveillance of                         multiple polyps. Procedure Code(s):     --- Professional ---                        [phone], Colonoscopy, flexible; with removal of                         tumor(s), polyp(s), or other lesion(s) by snare                         technique Diagnosis Code(s):     --- Professional ---                        K62.1, Rectal polyp                        K63.5, Polyp of colon                        K64.4, Residual hemorrhoidal skin tags                        R19.5, Other fecal abnormalities                        K57.30, Diverticulosis of large intestine without                          perforation or abscess without bleeding CPT copyright 2019 American Medical Association. All rights reserved. The codes documented in this report are preliminary and upon coder review may  be revised to meet current compliance requirements. Dr. Ulyess Mort Lin Landsman MD, MD 10/12/2019 8:52:43 AM This report has been signed electronically. Number of Addenda: 0 Note Initiated On: 10/12/2019 8:09 AM Scope Withdrawal Time: 0 hours 25 minutes 37 seconds  Total Procedure Duration: 0 hours 27 minutes 39 seconds  Estimated Blood Loss:  Estimated blood loss: none.      [facility]

## 2019-10-12 NOTE — Transfer of Care (Signed)
Immediate Anesthesia Transfer of Care Note  Patient: Rollene Fare III  Procedure(s) Performed: COLONOSCOPY WITH BIOPSY (N/A Rectum) POLYPECTOMY (N/A Rectum)  Patient Location: PACU  Anesthesia Type: General  Level of Consciousness: awake, alert  and patient cooperative  Airway and Oxygen Therapy: Patient Spontanous Breathing and Patient connected to supplemental oxygen  Post-op Assessment: Post-op Vital signs reviewed, Patient's Cardiovascular Status Stable, Respiratory Function Stable, Patent Airway and No signs of Nausea or vomiting  Post-op Vital Signs: Reviewed and stable  Complications: No complications documented.

## 2019-10-13 ENCOUNTER — Encounter: Payer: Self-pay | Admitting: Gastroenterology

## 2019-10-16 ENCOUNTER — Ambulatory Visit
Admission: RE | Admit: 2019-10-16 | Discharge: 2019-10-16 | Disposition: A | Discharge status: Home / Self Care | Payer: Medicare Other | Source: Ambulatory Visit | Attending: Gastroenterology | Admitting: Gastroenterology

## 2019-10-16 ENCOUNTER — Other Ambulatory Visit: Payer: Self-pay

## 2019-10-16 DIAGNOSIS — N281 Cyst of kidney, acquired: Secondary | ICD-10-CM | POA: Diagnosis not present

## 2019-10-16 DIAGNOSIS — M47816 Spondylosis without myelopathy or radiculopathy, lumbar region: Secondary | ICD-10-CM | POA: Diagnosis not present

## 2019-10-16 DIAGNOSIS — D461 Refractory anemia with ring sideroblasts: Secondary | ICD-10-CM

## 2019-10-16 DIAGNOSIS — D7389 Other diseases of spleen: Secondary | ICD-10-CM | POA: Diagnosis not present

## 2019-10-16 DIAGNOSIS — M47814 Spondylosis without myelopathy or radiculopathy, thoracic region: Secondary | ICD-10-CM | POA: Diagnosis not present

## 2019-10-16 DIAGNOSIS — R161 Splenomegaly, not elsewhere classified: Secondary | ICD-10-CM | POA: Insufficient documentation

## 2019-10-16 LAB — SURGICAL PATHOLOGY

## 2019-10-16 IMAGING — MR MR ABDOMEN WO/W CM
16 of 17 series · 44 of 48 positions shown · IV contrast (gadavist)
Comparison: Multiple exams, including ultrasound from [DATE]

CLINICAL DATA: Splenomegaly, myelodysplastic syndrome, splenic
lesion on ultrasound

EXAM:
MRI ABDOMEN WITHOUT AND WITH CONTRAST
TECHNIQUE: Multiplanar multisequence MR imaging of the abdomen was performed
both before and after the administration of intravenous contrast.
CONTRAST:  10mL GADAVIST GADOBUTROL 1 MMOL/ML IV SOLN

[Series 3: T2 · coronal · 6.0mm · 1.19mm/px · 1 of 32 slices shown (1 of 2)]
[im 1/32]
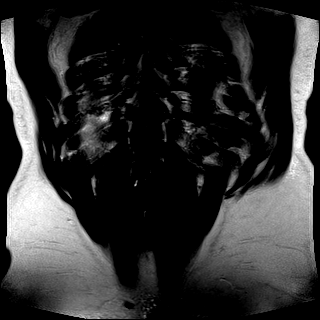

[Series 4: T2 · axial · 6.0mm · 1.19mm/px · 1 of 32 slices shown (2 of 2)]
[im 1/32]
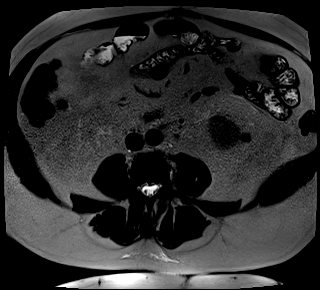

[Series 6: T2 fat-sat · axial · 6.0mm · 1.19mm/px · 1 of 34 slices shown]
[im 1/34]
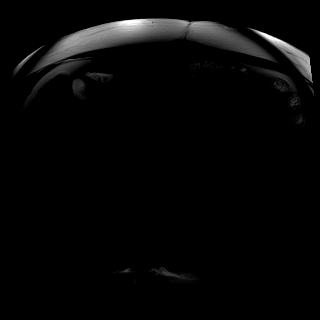

[Series 8: ax dwi_adc · axial · 6.0mm · 1.42mm/px · 1 of 34 slices shown]
[im 1/34]
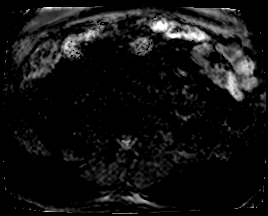

[Series 9: T1 · axial · 6.0mm · 0.74mm/px · z∈[-112,+112]mm · 3 of 64 slices shown]
[im 1/64]
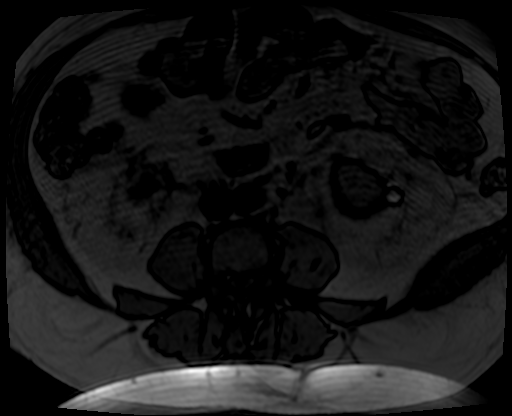
[im 32/64]
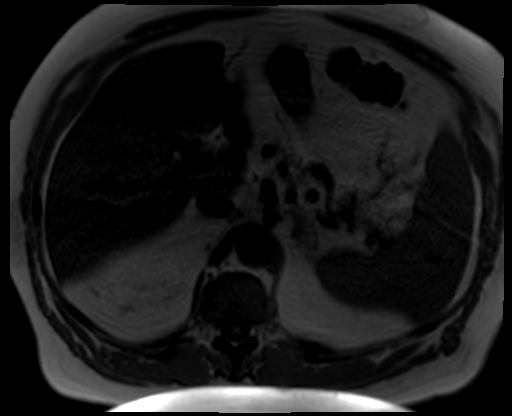
[im 64/64]
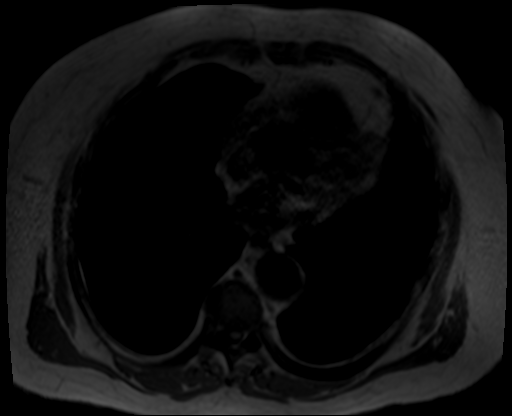

[Series 10: bSSFP · axial · 6.0mm · 0.74mm/px · 1 of 32 slices shown]
[im 1/32]
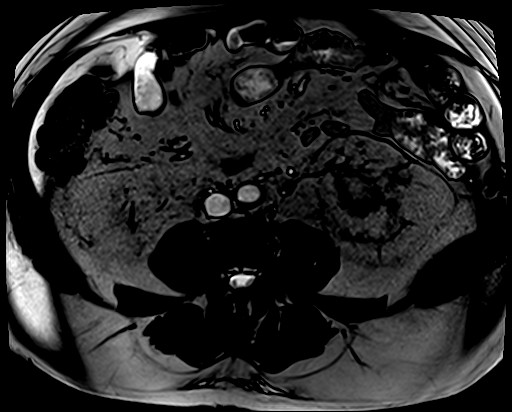

[Series 11: T1 dynamic fat-sat · axial · non-contrast · 3.0mm · 1.19mm/px · z∈[-118,+118]mm · 4 of 80 slices shown (1 of 5)]
[im 1/80]
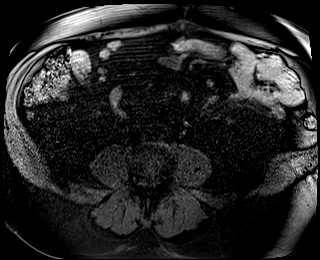
[im 27/80]
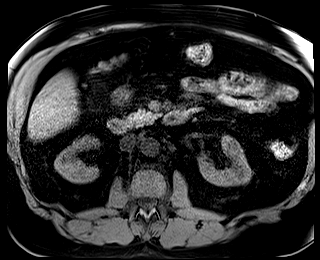
[im 53/80]
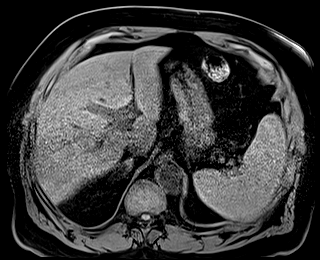
[im 80/80]
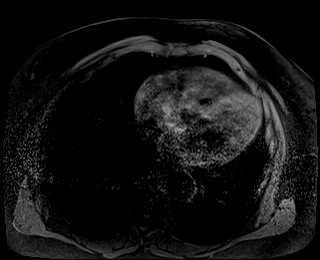

[Series 12: T1 dynamic fat-sat post-contrast · axial · 3.0mm · 1.19mm/px · z∈[-118,+118]mm · 4 of 80 slices shown (1 of 4)]
[im 1/80]
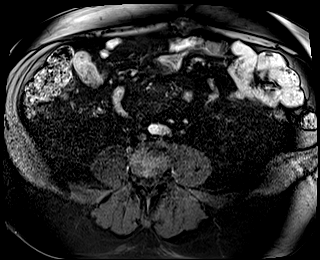
[im 27/80]
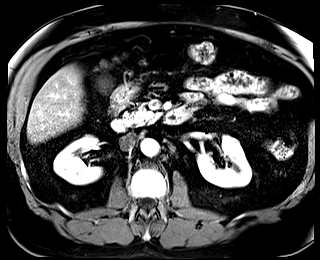
[im 53/80]
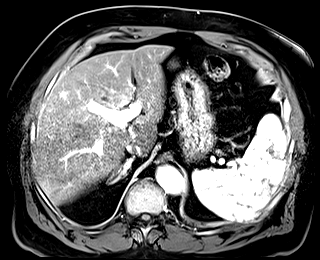
[im 80/80]
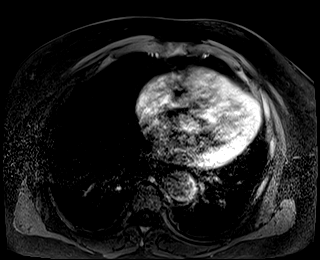

[Series 13: T1 dynamic fat-sat · axial · 3.0mm · 1.19mm/px · z∈[-118,+118]mm · 4 of 80 slices shown (2 of 5)]
[im 1/80]
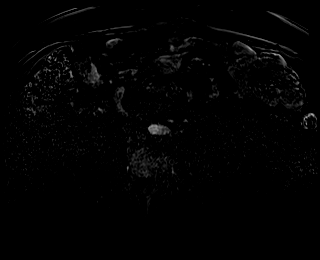
[im 27/80]
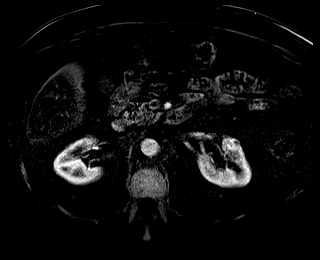
[im 53/80]
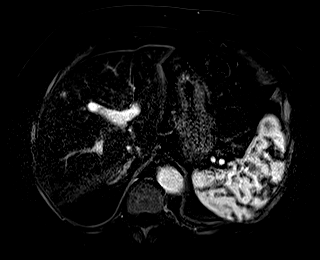
[im 80/80]
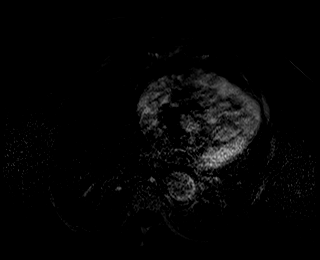

[Series 14: T1 dynamic fat-sat post-contrast · axial · 3.0mm · 1.19mm/px · z∈[-118,+118]mm · 4 of 80 slices shown (2 of 4)]
[im 1/80]
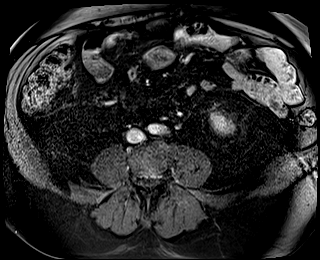
[im 27/80]
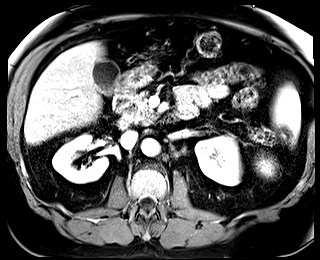
[im 53/80]
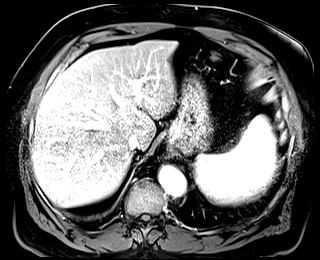
[im 80/80]
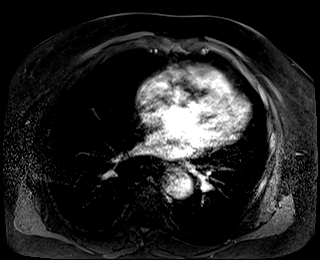

[Series 15: T1 dynamic fat-sat · axial · 3.0mm · 1.19mm/px · z∈[-118,+118]mm · 4 of 80 slices shown (3 of 5)]
[im 1/80]
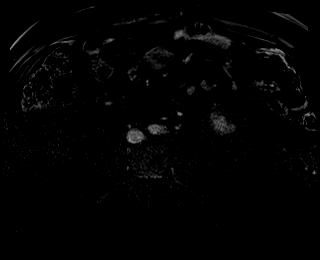
[im 27/80]
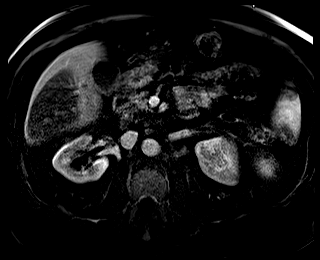
[im 53/80]
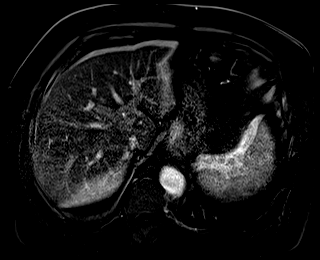
[im 80/80]
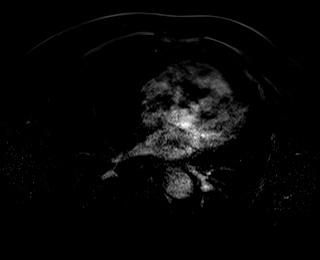

[Series 16: T1 dynamic fat-sat post-contrast · axial · 3.0mm · 1.19mm/px · z∈[-118,+118]mm · 4 of 80 slices shown (3 of 4)]
[im 1/80]
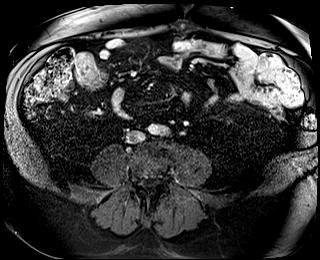
[im 27/80]
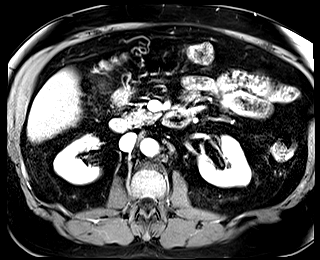
[im 53/80]
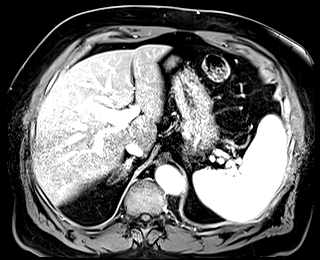
[im 80/80]
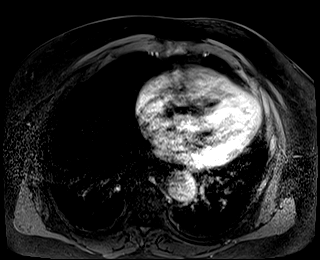

[Series 17: T1 dynamic fat-sat · axial · 3.0mm · 1.19mm/px · z∈[-118,+118]mm · 4 of 80 slices shown (4 of 5)]
[im 1/80]
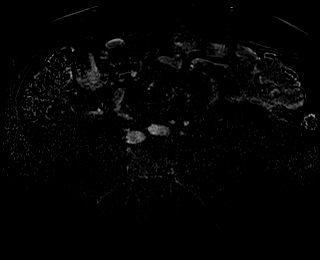
[im 27/80]
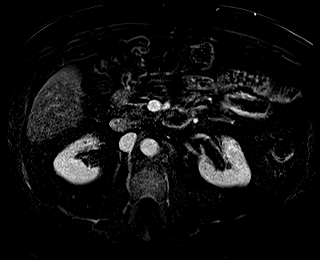
[im 53/80]
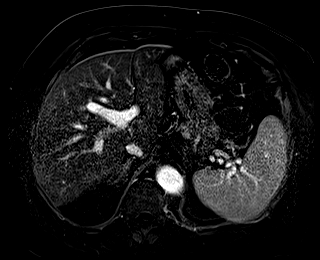
[im 80/80]
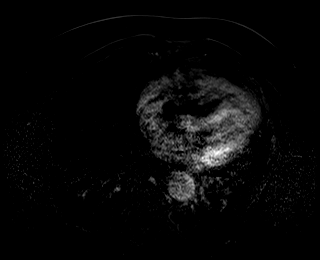

[Series 18: T1 dynamic post-contrast · coronal · 3.0mm · 1.31mm/px · 3 of 72 slices shown]
[im 1/72]
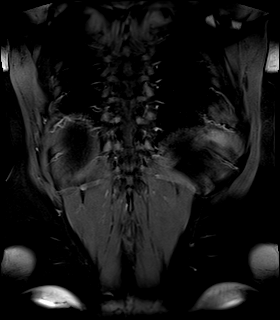
[im 36/72]
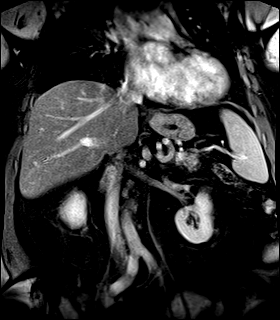
[im 72/72]
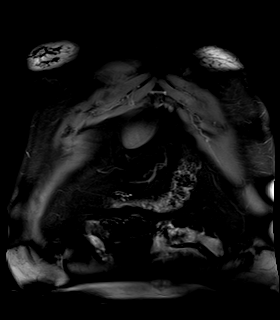

[Series 19: T1 dynamic fat-sat post-contrast · axial · 3.0mm · 1.19mm/px · z∈[-118,+118]mm · 4 of 80 slices shown (4 of 4)]
[im 1/80]
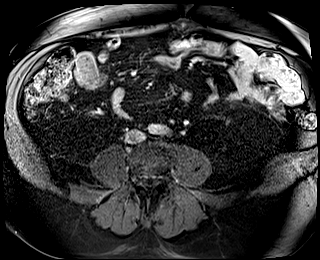
[im 27/80]
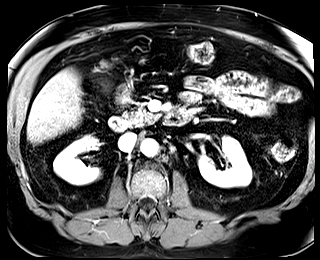
[im 53/80]
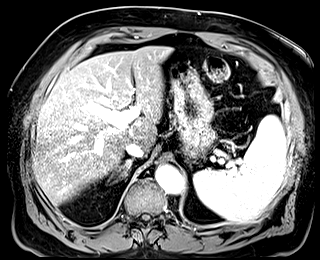
[im 80/80]
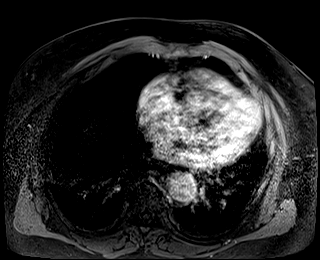

[Series 20: T1 dynamic fat-sat · axial · 3.0mm · 1.19mm/px · 1 of 80 slices shown (5 of 5)]
[im 1/80]
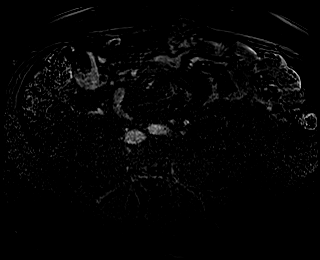

[44 of 48 positions shown; findings below may reference images not displayed]

FINDINGS: Despite efforts by the technologist and patient, motion artifact is
present on today's exam and could not be eliminated. This reduces
exam sensitivity and specificity.

Lower chest: Old left posterior tenth and eleventh rib fractures,
the tenth rib fracture demonstrates chronic displacement.

Hepatobiliary: Small dependent filling defect in the gallbladder for
example on image [DATE], suspicious for gallstone. No gallbladder wall
thickening or biliary dilatation. There is reduced signal in the
hepatic parenchyma on inphase images compared out of phase images,
which could reflect mild hemochromatosis.

Pancreas:  Unremarkable

Spleen: The spleen measures 12.8 by 7.2 by 10.4 cm (volume = 500
cm^3), compatible with mild splenomegaly.

Inferiorly in the spleen, a 2.3 by 2.2 by 2.3 cm lesion with a high
T2 signal intensity rim and speckled high and intermediate to low T2
signal intensity centrally has low T1 signal and probably minimal
enhancement on the delayed images. This lesion is mildly hypodense
on the [DATE] chest CT and is questionably present on the oldest
chest CT of [DATE] although admittedly that noncontrast exam
does not show of the lesion to good effect.

Adrenals/Urinary Tract: 0.7 cm cyst in the left mid kidney on image
26 series 4. Exophytic precontrast hyperdense 1.1 cm lesion of the
left kidney lower pole does not appreciably enhance and is
compatible with a Bosniak category 2 cyst (complex, but benign).
Other tiny cysts are present in the kidneys. Adrenal glands normal.

A mildly T2 hyperintense 1.3 by 0.9 by 1.1 cm lesion in the left mid
kidney anteriorly appears to be enhancing on subtraction images such
image 53 of series 13, concerning for a small renal cell carcinoma.

Stomach/Bowel: Unremarkable

Vascular/Lymphatic: No pathologic adenopathy. No tumor thrombus in
the left renal vein.

Other:  No supplemental non-categorized findings.

Musculoskeletal: Thoracic and lumbar spondylosis and degenerative
disc disease. Suspected small hemangiomas in the lower thoracic
spine.
IMPRESSION: 1. 1.3 by 0.9 by 1.1 cm enhancing lesion in the left mid kidney,
concerning for a small renal cell carcinoma. No tumor thrombus in
the left renal vein or adenopathy.
2. Mild splenomegaly. A complex 2.3 cm splenic lesion with high T2
signal and low T1 signal probably has faint delayed enhancement, and
is technically nonspecific. I suspect that this lesion was probably
present on [DATE] although that noncontrast CT exam did not show
this to good effect. Given the hyperechogenicity and enhanced
through transmission on ultrasound, this is highly likely to be a
hemangioma, but probably merits periodic sonographic surveillance in
6-12 months.
3. Suspected gallstone.
4. Thoracic and lumbar spondylosis and degenerative disc disease.
5. Old left posterior tenth and eleventh rib fractures.
6. Despite efforts by the technologist and patient, motion artifact
is present on today's exam and could not be eliminated. This reduces
exam sensitivity and specificity.

## 2019-10-16 MED ORDER — GADOBUTROL 1 MMOL/ML IV SOLN
10.0000 mL | Freq: Once | INTRAVENOUS | Status: AC | PRN
  Administered 2019-10-16: 10 mL via INTRAVENOUS

## 2019-10-17 ENCOUNTER — Inpatient Hospital Stay: Payer: Medicare Other | Attending: Hematology and Oncology

## 2019-10-17 ENCOUNTER — Encounter: Payer: Self-pay | Admitting: Gastroenterology

## 2019-10-17 ENCOUNTER — Inpatient Hospital Stay: Payer: Medicare Other

## 2019-10-17 VITALS — BP 132/79 | HR 71 | Temp 97.3°F | Resp 18

## 2019-10-17 DIAGNOSIS — D461 Refractory anemia with ring sideroblasts: Secondary | ICD-10-CM

## 2019-10-17 LAB — CBC
HCT: 28.8 % — ABNORMAL LOW (ref 39.0–52.0)
Hemoglobin: 9.5 g/dL — ABNORMAL LOW (ref 13.0–17.0)
MCH: 35.3 pg — ABNORMAL HIGH (ref 26.0–34.0)
MCHC: 33 g/dL (ref 30.0–36.0)
MCV: 107.1 fL — ABNORMAL HIGH (ref 80.0–100.0)
Platelets: 305 10*3/uL (ref 150–400)
RBC: 2.69 MIL/uL — ABNORMAL LOW (ref 4.22–5.81)
RDW: 18 % — ABNORMAL HIGH (ref 11.5–15.5)
WBC: 6.8 10*3/uL (ref 4.0–10.5)
nRBC: 0.6 % — ABNORMAL HIGH (ref 0.0–0.2)

## 2019-10-17 MED ORDER — DARBEPOETIN ALFA 100 MCG/0.5ML IJ SOSY
100.0000 ug | PREFILLED_SYRINGE | Freq: Once | INTRAMUSCULAR | Status: AC
  Administered 2019-10-17: 100 ug via SUBCUTANEOUS

## 2019-10-18 ENCOUNTER — Encounter: Payer: Self-pay | Admitting: Gastroenterology

## 2019-10-19 ENCOUNTER — Other Ambulatory Visit: Payer: Self-pay | Admitting: Hematology and Oncology

## 2019-10-19 DIAGNOSIS — N289 Disorder of kidney and ureter, unspecified: Secondary | ICD-10-CM

## 2019-10-25 ENCOUNTER — Telehealth: Payer: Self-pay | Admitting: Family Medicine

## 2019-10-25 NOTE — Telephone Encounter (Signed)
CB from [place], spouse, she states that had talk to Dr Lenna Sciara last week and a referral was supposed to be made to [facility] for husband's recent issue, they had not heard anything and called [facility] as thought missed call and they said they did not receive a referral and they were told would need an additional referral. Jackelyn Poling wants a call back when referral is complete,. Fu on Cell at [phone].

## 2019-10-26 NOTE — Telephone Encounter (Signed)
Scheduled pt for 10:45 Sept 5 in [facility] office Dr Diamantina [place]

## 2019-10-31 ENCOUNTER — Other Ambulatory Visit: Payer: Self-pay

## 2019-10-31 ENCOUNTER — Encounter: Payer: Self-pay | Admitting: Urology

## 2019-10-31 ENCOUNTER — Ambulatory Visit: Payer: Medicare Other | Admitting: Urology

## 2019-10-31 VITALS — BP 137/75 | HR 72 | Ht 70.0 in | Wt 263.0 lb

## 2019-10-31 DIAGNOSIS — N2889 Other specified disorders of kidney and ureter: Secondary | ICD-10-CM | POA: Diagnosis not present

## 2019-10-31 NOTE — Progress Notes (Signed)
   10/31/2019 10:53 AM   Brett Boone 1945/05/10 [id number]  Reason for visit: Left renal mass  HPI: I saw Brett Boone in urology clinic today for a 1 cm left renal mass.  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  He recently had an MRI performed on 10/16/2019 for splenomegaly/history of MDS and this showed a 1 cm enhancing endophytic left midpole lesion worrisome for possible renal cell carcinoma.  There was no evidence of metastatic disease, and CT chest in July 2021 showed no evidence of metastatic disease.  I personally reviewed the imaging.  He has a 20-pack-year smoking history and quit 2 years ago.  He denies any family history of bladder or kidney cancer.  Prior abdominal surgeries notable for multiple hernia repairs with mesh.  He has normal renal function with a creatinine of 1.03, EGFR greater than 60.  He denies any gross hematuria or flank pain.  We a long conversation about his new finding of a 1 cm enhancing left sided renal mass, and we reviewed the images together today.  We discussed ~70-80% chance of malignancy, but the very low risk of developing metastatic disease over the next 10 years with a small lesion.  We discussed that we cannot determine pathology or growth rate based on a single MRI, and there is no prior cross-sectional imaging to review.  We reviewed different treatment strategies at length including active surveillance, renal mass biopsy, percutaneous ablation, partial nephrectomy, and radical nephrectomy.  We discussed the risks and benefits at length of these treatment options.  I recommended either active surveillance or renal mass biopsy +/-percutaneous ablation with his excellent health.  He is amenable to discussing these treatments with interventional radiology.  -Referral  placed to interventional radiology for consideration of renal mass biopsy and possible percutaneous ablation of left-sided 1 cm small renal mass -Virtual visit 4 weeks to discuss treatment plan  Billey Co, Brett Boone, Brett Boone, [place] [address] [phone]

## 2019-11-01 ENCOUNTER — Other Ambulatory Visit: Payer: Self-pay | Admitting: Radiology

## 2019-11-01 DIAGNOSIS — N2889 Other specified disorders of kidney and ureter: Secondary | ICD-10-CM

## 2019-11-07 ENCOUNTER — Encounter: Payer: Self-pay | Admitting: *Deleted

## 2019-11-07 ENCOUNTER — Other Ambulatory Visit: Payer: Self-pay

## 2019-11-07 ENCOUNTER — Ambulatory Visit
Admission: RE | Admit: 2019-11-07 | Discharge: 2019-11-07 | Disposition: A | Discharge status: Home / Self Care | Payer: Medicare Other | Source: Ambulatory Visit | Attending: Urology | Admitting: Urology

## 2019-11-07 DIAGNOSIS — N2889 Other specified disorders of kidney and ureter: Secondary | ICD-10-CM

## 2019-11-07 HISTORY — PX: IR RADIOLOGIST EVAL & MGMT: IMG5224

## 2019-11-07 NOTE — Progress Notes (Signed)
Chief Complaint: Patient was consulted remotely today (TeleHealth) for a left renal mass at the request of Brett Boone,Brian C.    Referring Physician(s): Brett Boone,Brian C  History of Present Illness: Brett Boone is a 74 y.o. male with a recent workup for macrocytic anemia by Dr. 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 He has been started on Darbepoetin alfa injections by Dr. Mike Boone.  Abdominal ultrasound demonstrated splenomegaly and a splenic lesion measuring approximately 2.6 cm.  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  There was incidental note made of a solid enhancing lesion of the anterior left interpolar renal cortex measuring approximately 1.3 x 0.9 x 1.1 cm and concerning for a small renal carcinoma.  The patient was referred to Dr. Diamantina Boone who has discussed treatment options with [name] including active surveillance, biopsy and percutaneous ablation.  He has now been referred to discuss potential percutaneous treatment options of the small renal mass.  [name] is currently asymptomatic.  He has no urinary complaints.  Recent colonoscopy by Dr. Marius Boone for heme positive stool was negative except for some polyps that were removed and demonstrated no evidence of malignancy or dysplasia. He is a former smoker but quit 1-1/2 years ago.  Past Medical History:  Diagnosis Date  . Anemia   . Heart murmur   . Venous stasis dermatitis of both lower extremities     Past Surgical History:  Procedure Laterality Date  . CATARACT EXTRACTION W/PHACO Right 03/09/2017   Procedure: CATARACT EXTRACTION PHACO AND INTRAOCULAR LENS PLACEMENT ([place]) RIGHT;  Surgeon: Eulogio Bear, MD;  Location: [place];  Service: Ophthalmology;  Laterality: Right;  . COLONOSCOPY   2013   normal- cleared for 5 yrs- Dr Brett Boone  . COLONOSCOPY WITH PROPOFOL N/A 02/17/2017   Procedure: COLONOSCOPY WITH PROPOFOL;  Surgeon: Brett Landsman, MD;  Location: [place];  Service: Endoscopy;  Laterality: N/A;  . COLONOSCOPY WITH PROPOFOL N/A 10/12/2019   Procedure: COLONOSCOPY WITH BIOPSY;  Surgeon: Brett Landsman, MD;  Location: [place];  Service: Endoscopy;  Laterality: N/A;  . EPIGASTRIC HERNIA REPAIR N/A 07/06/2014   Procedure: HERNIA REPAIR EPIGASTRIC ADULT;  Surgeon: Brett Maduro, MD;  Location: [facility];  Service: General;  Laterality: N/A;  . HERNIA REPAIR Bilateral   . INSERTION OF MESH N/A 07/06/2014   Procedure: INSERTION OF MESH;  Surgeon: Brett Maduro, MD;  Location: [facility];  Service: General;  Laterality: N/A;  . POLYPECTOMY  02/17/2017   Procedure: POLYPECTOMY;  Surgeon: Brett Landsman, MD;  Location: [place];  Service: Endoscopy;;  . POLYPECTOMY N/A 10/12/2019   Procedure: POLYPECTOMY;  Surgeon: Brett Landsman, MD;  Location: [place];  Service: Endoscopy;  Laterality: N/A;  . SHOULDER ARTHROSCOPY Left 2008  . SINUS EXPLORATION      Allergies: Amoxicillin-pot clavulanate  Medications: Prior to Admission medications   Medication Sig Start Date End Date Taking? Authorizing Provider  tadalafil (CIALIS) 5 MG tablet Take 5 mg by mouth daily as needed for erectile dysfunction.  06/27/19   Billey Co, MD     Family History  Problem Relation Age of Onset  . Diabetes Father   . Heart disease Father 71  . Stroke Mother     Social History   Socioeconomic History  . Marital status: Married  Spouse name: Not on file  . Number of children: 3  . Years of education: Not on file  . Highest education level: Bachelor's degree (e.g., BA, AB, BS)  Occupational History  . Occupation: Retired  Tobacco Use  . Smoking status: Former Smoker    Packs/day: 0.25    Years: 50.00    Pack years:  12.50    Types: Cigarettes    Quit date: 05/03/2018    Years since quitting: 1.5  . Smokeless tobacco: Former Systems developer  . Tobacco comment: pt quit smoking  Vaping Use  . Vaping Use: Never used  Substance and Sexual Activity  . Alcohol use: Yes    Comment: Occasional -1x/month  . Drug use: No  . Sexual activity: Yes  Other Topics Concern  . Not on file  Social History Narrative  . Not on file   Social Determinants of Health   Financial Resource Strain: Low Risk   . Difficulty of Paying Living Expenses: Not hard at all  Food Insecurity: No Food Insecurity  . Worried About Charity fundraiser in the Last Year: Never true  . Ran Out of Food in the Last Year: Never true  Transportation Needs: No Transportation Needs  . Lack of Transportation (Medical): No  . Lack of Transportation (Non-Medical): No  Physical Activity: Inactive  . Days of Exercise per Week: 0 days  . Minutes of Exercise per Session: 0 min  Stress: No Stress Concern Present  . Feeling of Stress : Only a little  Social Connections: Moderately Integrated  . Frequency of Communication with Friends and Family: More than three times a week  . Frequency of Social Gatherings with Friends and Family: Once a week  . Attends Religious Services: More than 4 times per year  . Active Member of Clubs or Organizations: No  . Attends Archivist Meetings: Never  . Marital Status: Married    ECOG Status: 0 - Asymptomatic  Review of Systems  Constitutional: Negative.   Respiratory: Negative.   Cardiovascular: Negative.   Gastrointestinal: Negative.   Genitourinary: Negative.   Musculoskeletal: Negative.   Neurological: Negative.     Review of Systems: A 12 point ROS discussed and pertinent positives are indicated in the HPI above.  All other systems are negative.  Physical Exam No direct physical exam was performed (except for noted visual exam findings with Video Visits).   Vital Signs: There were no vitals  taken for this visit.  Imaging: MR Abdomen W Wo Contrast  Result Date: 10/16/2019 CLINICAL DATA:  Splenomegaly, myelodysplastic syndrome, splenic lesion on ultrasound EXAM: MRI ABDOMEN WITHOUT AND WITH CONTRAST TECHNIQUE: Multiplanar multisequence MR imaging of the abdomen was performed both before and after the administration of intravenous contrast. CONTRAST:  2m GADAVIST GADOBUTROL 1 MMOL/ML IV SOLN COMPARISON:  Multiple exams, including ultrasound from 09/25/2019 FINDINGS: Despite efforts by the technologist and patient, motion artifact is present on today's exam and could not be eliminated. This reduces exam sensitivity and specificity. Lower chest: Old left posterior tenth and eleventh rib fractures, the tenth rib fracture demonstrates chronic displacement. Hepatobiliary: Small dependent filling defect in the gallbladder for example on image 19/6, suspicious for gallstone. No gallbladder wall thickening or biliary dilatation. There is reduced signal in the hepatic parenchyma on inphase images compared out of phase images, which could reflect mild hemochromatosis. Pancreas:  Unremarkable Spleen: The spleen measures 12.8 by 7.2 by 10.4 cm (volume = 500 cm^3), compatible with mild splenomegaly. Inferiorly in the spleen,  a 2.3 by 2.2 by 2.3 cm lesion with a high T2 signal intensity rim and speckled high and intermediate to low T2 signal intensity centrally has low T1 signal and probably minimal enhancement on the delayed images. This lesion is mildly hypodense on the 08/01/2019 chest CT and is questionably present on the oldest chest CT of 05/04/2017 although admittedly that noncontrast exam does not show of the lesion to good effect. Adrenals/Urinary Tract: 0.7 cm cyst in the left mid kidney on image 26 series 4. Exophytic precontrast hyperdense 1.1 cm lesion of the left kidney lower pole does not appreciably enhance and is compatible with a Bosniak category 2 cyst (complex, but benign). Other tiny cysts  are present in the kidneys. Adrenal glands normal. 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 Stomach/Bowel: Unremarkable Vascular/Lymphatic: No pathologic adenopathy. No tumor thrombus in the left renal vein. Other:  No supplemental non-categorized findings. Musculoskeletal: Thoracic and lumbar spondylosis and degenerative disc disease. Suspected small hemangiomas in the lower thoracic spine. IMPRESSION: 1. 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 No tumor thrombus in the left renal vein or adenopathy. 2. Mild splenomegaly. A complex 2.3 cm splenic lesion with high T2 signal and low T1 signal probably has faint delayed enhancement, and is technically nonspecific. I suspect that this lesion was probably present on 07/20/2018 although that noncontrast CT exam did not show this to good effect. Given the hyperechogenicity and enhanced through transmission on ultrasound, this is highly likely to be a hemangioma, but probably merits periodic sonographic surveillance in 6-12 months. 3. Suspected gallstone. 4. Thoracic and lumbar spondylosis and degenerative disc disease. 5. Old left posterior tenth and eleventh rib fractures. 6. Despite efforts by the technologist and patient, motion artifact is present on today's exam and could not be eliminated. This reduces exam sensitivity and specificity. Electronically Signed   By: Van Clines M.D.   On: 10/16/2019 10:15    Labs:  CBC: Recent Labs    07/26/19 1205 08/15/19 1436 09/11/19 0826 10/17/19 1336  WBC 7.2 6.2 6.4 6.8  HGB 9.8* 9.3* 9.8* 9.5*  HCT 28.5* 29.0* 30.0* 28.8*  PLT 324 309 268 305    COAGS: No results for input(s): INR, APTT in the last 8760 hours.  BMP: Recent Labs    07/19/19 0948  NA 141  K 4.7  CL 105   CO2 24  GLUCOSE 100*  BUN 20  CALCIUM 9.1  CREATININE 1.03  GFRNONAA 72  GFRAA 84    LIVER FUNCTION TESTS: Recent Labs    07/19/19 0948  BILITOT 1.0  AST 17  ALT 14  ALKPHOS 50  PROT 6.4  ALBUMIN 4.1    Assessment and Plan:  I spoke with Mr. Millon and his wife by phone.  We reviewed findings from the MRI that demonstrate a subtle, endophytic anterior cortical mass of the interpolar left kidney measuring roughly 1.3-1.4 cm in greatest diameter and demonstrating evidence of enhancement after contrast administration.  There are some other benign and hemorrhagic renal cysts.  There are no prior imaging studies of the kidneys available to demonstrate any growth pattern or whether the lesion was present previously.  I discussed treatment options with Mr. Branscome and his wife for the renal lesion which statistically  is most likely a small, incidental renal carcinoma.  Metastatic potential for a lesion of the size is extremely low.  Growth rate is also typically fairly slow.  Options were discussed including active imaging surveillance to evaluate for growth, percutaneous biopsy with cryoablation under general anesthesia and partial nephrectomy.  An anterior lesion is slightly more technically challenging to treat percutaneously but there likely would be an oblique approach to the lesion.  Biopsy prior to ablation would not be necessary if there is a high suspicion of malignancy prior to treatment.  After discussing options, I recommended that we pursue at least one follow-up MRI of the abdomen to evaluate for lesion growth in approximately 6 months.  Slight growth would indicate with increased confidence the likelihood of a malignant lesion and the lesion would still be well within size parameters to allow for percutaneous cryoablation.  Biopsy of the mass would not be separately needed prior to deciding to treat the lesion and would be attempted to be performed at the time of cryoablation to  establish a tissue diagnosis.  Mr. Ambrosio does not have any other significant medical problems and would not be considered a high risk for general anesthesia or ablation but does have chronic anemia which would slightly increase his risk should he have a bleeding complication.  We will plan to schedule a follow-up MRI at [facility] in 6 months and I will meet with him afterwards to review findings.  Thank you for this interesting consult.  I greatly enjoyed meeting [name] and look forward to participating in their care.  A copy of this report was sent to the requesting provider on this date.  Electronically Signed: Azzie Roup 11/07/2019, 12:19 PM     I spent a total of 30 Minutes  in remote  clinical consultation, greater than 50% of which was counseling/coordinating care for a left renal mass.    Visit type: Audio only (telephone). Audio (no video) only due to patient's lack of internet/smartphone capability. Alternative for in-person consultation at [facility], [address], [place], [place]. This visit type was conducted due to national recommendations for restrictions regarding the COVID-19 Pandemic (e.g. social distancing).  This format is felt to be most appropriate for this patient at this time.  All issues noted in this document were discussed and addressed.

## 2019-11-13 ENCOUNTER — Other Ambulatory Visit: Payer: Self-pay

## 2019-11-13 DIAGNOSIS — N289 Disorder of kidney and ureter, unspecified: Secondary | ICD-10-CM

## 2019-11-14 ENCOUNTER — Inpatient Hospital Stay: Payer: Medicare Other | Attending: Hematology and Oncology

## 2019-11-14 ENCOUNTER — Other Ambulatory Visit: Payer: Self-pay | Admitting: Hematology and Oncology

## 2019-11-14 ENCOUNTER — Other Ambulatory Visit: Payer: Self-pay

## 2019-11-14 ENCOUNTER — Inpatient Hospital Stay: Payer: Medicare Other

## 2019-11-14 VITALS — BP 124/83 | HR 78 | Resp 18

## 2019-11-14 DIAGNOSIS — D461 Refractory anemia with ring sideroblasts: Secondary | ICD-10-CM | POA: Diagnosis not present

## 2019-11-14 DIAGNOSIS — N289 Disorder of kidney and ureter, unspecified: Secondary | ICD-10-CM

## 2019-11-14 LAB — CBC
HCT: 28.8 % — ABNORMAL LOW (ref 39.0–52.0)
Hemoglobin: 9.6 g/dL — ABNORMAL LOW (ref 13.0–17.0)
MCH: 34.9 pg — ABNORMAL HIGH (ref 26.0–34.0)
MCHC: 33.3 g/dL (ref 30.0–36.0)
MCV: 104.7 fL — ABNORMAL HIGH (ref 80.0–100.0)
Platelets: 313 10*3/uL (ref 150–400)
RBC: 2.75 MIL/uL — ABNORMAL LOW (ref 4.22–5.81)
RDW: 18.5 % — ABNORMAL HIGH (ref 11.5–15.5)
WBC: 6.5 10*3/uL (ref 4.0–10.5)
nRBC: 0.5 % — ABNORMAL HIGH (ref 0.0–0.2)

## 2019-11-14 MED ORDER — DARBEPOETIN ALFA 100 MCG/0.5ML IJ SOSY
100.0000 ug | PREFILLED_SYRINGE | Freq: Once | INTRAMUSCULAR | Status: AC
  Administered 2019-11-14: 100 ug via SUBCUTANEOUS

## 2019-11-21 ENCOUNTER — Other Ambulatory Visit: Payer: Self-pay

## 2019-11-21 ENCOUNTER — Ambulatory Visit (INDEPENDENT_AMBULATORY_CARE_PROVIDER_SITE_OTHER): Payer: Medicare Other

## 2019-11-21 DIAGNOSIS — Z23 Encounter for immunization: Secondary | ICD-10-CM | POA: Diagnosis not present

## 2019-11-28 ENCOUNTER — Telehealth (INDEPENDENT_AMBULATORY_CARE_PROVIDER_SITE_OTHER): Payer: Medicare Other | Admitting: Urology

## 2019-11-28 ENCOUNTER — Other Ambulatory Visit: Payer: Self-pay

## 2019-11-28 ENCOUNTER — Other Ambulatory Visit: Payer: Self-pay | Admitting: Urology

## 2019-11-28 DIAGNOSIS — N2889 Other specified disorders of kidney and ureter: Secondary | ICD-10-CM | POA: Diagnosis not present

## 2019-11-28 NOTE — Progress Notes (Signed)
Virtual Visit via Telephone Note  I connected with Brett Boone on 11/28/19 at 10:45 AM EDT by telephone and verified that I am speaking with the correct person using two identifiers.   Patient location: Home Provider location: [facility] office)   I discussed the limitations, risks, security and privacy concerns of performing an evaluation and management service by telephone and the availability of in person appointments. We discussed the impact of the COVID-19 pandemic on the healthcare system, and the importance of social distancing and reducing patient and provider exposure. I also discussed with the patient that there may be a patient responsible charge related to this service. The patient expressed understanding and agreed to proceed.  Reason for visit: 1.3cm enhancing left renal mass  History of Present Illness: 74 year old male with incidental finding of a 1.3 cm enhancing left renal mass seen on MRI abdomen on 10/16/2019.  We previously discussed options including active surveillance, renal mass biopsy, percutaneous ablation, or partial nephrectomy.  He also had a virtual visit with Dr. Kathlene Cote of interventional radiology.  I had recommended repeat imaging in 6 months to determine growth rate, and likely active surveillance, with consideration of percutaneous ablation if significant growth noted.  Dr. Kathlene Cote had similar recommendations.  Patient is in agreement with active surveillance and repeat MRI in 5 to 6 months with follow-up at that time with both myself and interventional radiology.  We will plan for active surveillance at this time, however consider percutaneous ablation in the future if significant growth change noted.  Follow Up: RTC 5 months with MRI abdomen prior for active surveillance of small 1.3 renal mass   I discussed the assessment and treatment plan with the patient. The patient was provided an opportunity to ask questions and all were  answered. The patient agreed with the plan and demonstrated an understanding of the instructions.   The patient was advised to call back or seek an in-person evaluation if the symptoms worsen or if the condition fails to improve as anticipated.  I provided 12 minutes of non-face-to-face time during this encounter.   Billey Co, MD

## 2019-12-11 NOTE — Progress Notes (Signed)
[facility]  [address], [address] [place], [place] [address] Phone: [phone]  Fax: [phone]   Clinic Day:  12/12/2019  Referring physician: [name], MD  Chief Complaint: Brett Boone is a 74 y.o. male with a myelodysplastic syndrome with ringed sideroblasts who is seen for 12 week assessment.  HPI: The patient was last seen in the hematology clinic on 09/19/2019. At that time, he felt fine. He denied any complaints. Erythropoietin level was 63.3.  The patient saw Dr. Marius Ditch on 09/20/2019 for positive fecal occult blood testing. Abdominal ultrasound revealed a 2.6 cm hyperechoic lesion in the spleen, likely a benign lesion such as an angioma but MRI with and without contrast was recommended for further evaluation. There was mild splenomegaly (16.5 cm). There were a few small gallstones without evidence of cholecystitis.  Colonoscopy on 10/12/2019 with Dr. Marius Ditch revealed nine 3 to 7 mm polyps: 3 in the rectum (hyperplastic polyps), 5 in the ascending colon (sessile serrated polyps and tubular adenomas), and 1 in the cecum (serrated polyp). There was diverticulosis in the sigmoid colon. There were non-bleeding external hemorrhoids..  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 There was no tumor thrombus in the left renal vein or adenopathy. There was mild splenomegaly (12.8 x 7.2 x 10.4 cm). There was a complex 2.3 cm splenic lesion with high T2 signal and low T1 signal probably had faint delayed enhancement, and was technically nonspecific. This lesion was probably present on 07/20/2018 although that noncontrast CT exam did not show this to good effect. Given the hyperechogenicity and enhanced through transmission on ultrasound, this was highly likely to be a hemangioma, but probably merits periodic sonographic surveillance  in 6-12 months. There was a suspected gallstone. 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 There were old left posterior tenth and eleventh rib fractures. He was referred to urology.  The patient saw Dr. Diamantina Providence on 10/31/2019 for a left renal mass. The patient denied any gross hematuria or flank pain. They discussed treatment options in depth (active surveillance, renal biopsy, percutaneous ablation, partial nephrectomy, and radical nephrectomy).  He was referred to interventional radiology for consideration of renal mass biopsy and possible percutaneous ablation.  He met with Dr Aletta Edouard in interventional radiology on 11/07/2019 via telemedicine.  The patient spoke with Dr. Diamantina Providence by phone on 11/28/2019. They planned for active surveillance with an abdominal MRI in 5 months.  CBC followed: 10/17/2019: Hematocrit 28.8, hemoglobin 9.5, MCV 107.1, platelets 305,000, WBC 6,800. 11/14/2019: Hematocrit 28.8, hemoglobin 9.6, MCV 104.7, platelets 313,000, WBC 6,500.  The patient received Aranesp on 10/17/2019 and 11/14/2019.  During the interim, he has been "good". He notes some weight gain because he stopped trying to lose weight. He denies chest pain, palpitations, lumps or bumps, hematuria, nausea, vomiting, and diarrhea.   Past Medical History:  Diagnosis Date  . Anemia   . Heart murmur   . Venous stasis dermatitis of both lower extremities     Past Surgical History:  Procedure Laterality Date  . CATARACT EXTRACTION W/PHACO Right 03/09/2017   Procedure: CATARACT EXTRACTION PHACO AND INTRAOCULAR LENS PLACEMENT ([place]) RIGHT;  Surgeon: Eulogio Bear, MD;  Location: [place];  Service: Ophthalmology;  Laterality: Right;  . COLONOSCOPY  2013   normal- cleared for 5 yrs- Dr Beckey Downing  . COLONOSCOPY WITH  PROPOFOL N/A 02/17/2017   Procedure: COLONOSCOPY WITH PROPOFOL;  Surgeon: Lin Landsman, MD;  Location: [place];  Service:  Endoscopy;  Laterality: N/A;  . COLONOSCOPY WITH PROPOFOL N/A 10/12/2019   Procedure: COLONOSCOPY WITH BIOPSY;  Surgeon: Lin Landsman, MD;  Location: [place];  Service: Endoscopy;  Laterality: N/A;  . EPIGASTRIC HERNIA REPAIR N/A 07/06/2014   Procedure: HERNIA REPAIR EPIGASTRIC ADULT;  Surgeon: Molly Maduro, MD;  Location: [facility];  Service: General;  Laterality: N/A;  . HERNIA REPAIR Bilateral   . INSERTION OF MESH N/A 07/06/2014   Procedure: INSERTION OF MESH;  Surgeon: Molly Maduro, MD;  Location: [facility];  Service: General;  Laterality: N/A;  . IR RADIOLOGIST EVAL & MGMT  11/07/2019  . POLYPECTOMY  02/17/2017   Procedure: POLYPECTOMY;  Surgeon: Lin Landsman, MD;  Location: [place];  Service: Endoscopy;;  . POLYPECTOMY N/A 10/12/2019   Procedure: POLYPECTOMY;  Surgeon: Lin Landsman, MD;  Location: [place];  Service: Endoscopy;  Laterality: N/A;  . SHOULDER ARTHROSCOPY Left 2008  . SINUS EXPLORATION      Family History  Problem Relation Age of Onset  . Diabetes Father   . Heart disease Father 81  . Stroke Mother     Social History:  reports that he quit smoking about 19 months ago. His smoking use included cigarettes. He has a 12.50 pack-year smoking history. He has quit using smokeless tobacco. He reports current alcohol use. He reports that he does not use drugs. 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 The patient is alone today.  Allergies:  Allergies  Allergen Reactions  . Amoxicillin-Pot Clavulanate Hives and Nausea And Vomiting    Current Medications: Current Outpatient Medications  Medication Sig Dispense Refill  . tadalafil (CIALIS) 5 MG tablet Take 5 mg by mouth daily as needed for erectile dysfunction.      No current facility-administered medications for this visit.    Review of Systems  Constitutional: Negative for chills, diaphoresis, fever, malaise/fatigue  and weight loss (up 12 lbs).  HENT: Negative for congestion, ear discharge, ear pain, hearing loss, nosebleeds, sinus pain, sore throat and tinnitus.   Eyes: Negative for blurred vision.  Respiratory: Negative for cough, hemoptysis, sputum production and shortness of breath.   Cardiovascular: Negative for chest pain, palpitations and leg swelling.  Gastrointestinal: Negative for abdominal pain, blood in stool, constipation, diarrhea, heartburn, melena, nausea and vomiting.  Genitourinary: Negative for dysuria, frequency, hematuria and urgency.  Musculoskeletal: Negative for back pain, joint pain, myalgias and neck pain.  Skin: Negative for itching and rash.  Neurological: Negative for dizziness, tingling, sensory change, weakness and headaches.  Endo/Heme/Allergies: Does not bruise/bleed easily.  Psychiatric/Behavioral: Negative for depression and memory loss. The patient is not nervous/anxious and does not have insomnia.   All other systems reviewed and are negative.  Performance status (ECOG): 0  Vitals Blood pressure 140/66, pulse 78, temperature (!) 96.7 F (35.9 C), temperature source Tympanic, resp. rate 18, weight 266 lb 12.1 oz (121 kg), SpO2 99 %.   Physical Exam Vitals and nursing note reviewed.  Constitutional:      General: He is not in acute distress.    Appearance: He is not diaphoretic.  HENT:     Head: Normocephalic and atraumatic.     Comments: Gray hair.    Mouth/Throat:     Mouth: Mucous membranes are moist.     Pharynx:  Oropharynx is clear.  Eyes:     General: No scleral icterus.    Extraocular Movements: Extraocular movements intact.     Conjunctiva/sclera: Conjunctivae normal.     Pupils: Pupils are equal, round, and reactive to light.  Cardiovascular:     Rate and Rhythm: Normal rate and regular rhythm.     Heart sounds: Normal heart sounds. No murmur heard.   Pulmonary:     Effort: Pulmonary effort is normal. No respiratory distress.     Breath  sounds: Normal breath sounds. No wheezing or rales.  Chest:     Chest wall: No tenderness.  Abdominal:     General: Bowel sounds are normal. There is no distension.     Palpations: Abdomen is soft. There is no mass.     Tenderness: There is no abdominal tenderness. There is no guarding or rebound.  Musculoskeletal:        General: No swelling or tenderness. Normal range of motion.     Cervical back: Normal range of motion and neck supple.  Lymphadenopathy:     Head:     Right side of head: No preauricular, posterior auricular or occipital adenopathy.     Left side of head: No preauricular, posterior auricular or occipital adenopathy.     Cervical: No cervical adenopathy.     Upper Body:     Right upper body: No supraclavicular or axillary adenopathy.     Left upper body: No supraclavicular or axillary adenopathy.     Lower Body: No right inguinal adenopathy. No left inguinal adenopathy.  Skin:    General: Skin is warm and dry.  Neurological:     Mental Status: He is alert and oriented to person, place, and time. Mental status is at baseline.  Psychiatric:        Mood and Affect: Mood normal.        Behavior: Behavior normal.        Thought Content: Thought content normal.        Judgment: Judgment normal.    Appointment on 12/12/2019  Component Date Value Ref Range Status  . Sodium 12/12/2019 137  135 - 145 mmol/L Final  . Potassium 12/12/2019 4.5  3.5 - 5.1 mmol/L Final  . Chloride 12/12/2019 105  98 - 111 mmol/L Final  . CO2 12/12/2019 26  22 - 32 mmol/L Final  . Glucose, Bld 12/12/2019 98  70 - 99 mg/dL Final   Glucose reference range applies only to samples taken after fasting for at least 8 hours.  . BUN 12/12/2019 15  8 - 23 mg/dL Final  . Creatinine, Ser 12/12/2019 1.18  0.61 - 1.24 mg/dL Final  . Calcium 12/12/2019 8.6* 8.9 - 10.3 mg/dL Final  . Total Protein 12/12/2019 6.6  6.5 - 8.1 g/dL Final  . Albumin 12/12/2019 4.0  3.5 - 5.0 g/dL Final  . AST 12/12/2019 19   15 - 41 U/L Final  . ALT 12/12/2019 16  0 - 44 U/L Final  . Alkaline Phosphatase 12/12/2019 37* 38 - 126 U/L Final  . Total Bilirubin 12/12/2019 1.2  0.3 - 1.2 mg/dL Final  . GFR, Estimated 12/12/2019 >60  >60 mL/min Final   Comment: (NOTE) Calculated using the CKD-EPI Creatinine Equation (2021)   . Anion gap 12/12/2019 6  5 - 15 Final   Performed at [facility], [address], [place], [place] [address]  . WBC 12/12/2019 6.4  4.0 - 10.5 K/uL Final  . RBC 12/12/2019  2.66* 4.22 - 5.81 MIL/uL Final  . Hemoglobin 12/12/2019 9.3* 13.0 - 17.0 g/dL Final  . HCT 12/12/2019 28.3* 39 - 52 % Final  . MCV 12/12/2019 106.4* 80.0 - 100.0 fL Final  . MCH 12/12/2019 35.0* 26.0 - 34.0 pg Final  . MCHC 12/12/2019 32.9  30.0 - 36.0 g/dL Final  . RDW 12/12/2019 19.0* 11.5 - 15.5 % Final  . Platelets 12/12/2019 329  150 - 400 K/uL Final  . nRBC 12/12/2019 1.1* 0.0 - 0.2 % Final  . Neutrophils Relative % 12/12/2019 63  % Final  . Neutro Abs 12/12/2019 4.1  1.7 - 7.7 K/uL Final  . Lymphocytes Relative 12/12/2019 23  % Final  . Lymphs Abs 12/12/2019 1.4  0.7 - 4.0 K/uL Final  . Monocytes Relative 12/12/2019 9  % Final  . Monocytes Absolute 12/12/2019 0.6  0.1 - 1.0 K/uL Final  . Eosinophils Relative 12/12/2019 3  % Final  . Eosinophils Absolute 12/12/2019 0.2  0.0 - 0.5 K/uL Final  . Basophils Relative 12/12/2019 1  % Final  . Basophils Absolute 12/12/2019 0.1  0.0 - 0.1 K/uL Final  . Immature Granulocytes 12/12/2019 1  % Final  . Abs Immature Granulocytes 12/12/2019 0.04  0.00 - 0.07 K/uL Final   Performed at [facility], [address], [place], [place] [address]    Assessment:  Brett Boone is a 74 y.o. 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  He presented with a macrocytic anemia.  He denies any underlying liver disease.  He drinks socially.  Bone marrow biopsy on 09/11/2019 revealed macrocytic anemia and hypercellular bone marrow for age  with dyspoietic changes primarily involving the erythroid cell lines associated with abundant ring sideroblasts (> 15%). There was no increase in blasts. Flow cytometry was negative. Cytogenetics revealed 45,X,-Y [20].  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 IPSS score is low.  CBC on 07/26/2019 revealed a hematocrit 28.5, hemoglobin 9.8, MCV 102, platelets 324,000, WBC 7200 with an ANC 4800.  Labs on 07/19/2019 included a ferritin of 1020, B12 of 1083 and folate 6.9.  Fecal occult blood was positive on 08/01/2019.   Work-up on 08/15/2019 revealed a  hematocrit of 29.0, hemoglobin 9.3, MCV 109.0, platelets 309,000, WBC 6,200. Ferritin was 641 with an iron saturation of 40% and a TIBC of 241. Reticulocyte count was 3.2%. TSH was 1.275 and free T4 was 0.85.  CRP was 0.6 and sed rate 24.  He patient received Aranesp on 10/17/2019 and 11/14/2019.  Ferritin has been followed: 1020 on 07/19/2019 and 641 on 08/15/2019.  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  Colonoscopy on 10/12/2019 revealed nine 3 to 7 mm polyps: 3 in the rectum (hyperplastic polyps), 5 in the ascending colon (sessile serrated polyps and tubular adenomas), and 1 in the cecum (serrated polyp). There was diverticulosis in the sigmoid colon. There were non-bleeding external hemorrhoids..  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 There was no tumor thrombus in the left renal vein or adenopathy. There was mild splenomegaly (12.8 x 7.2 x 10.4 cm). There was a complex 2.3 cm splenic lesion with high T2 signal and low T1 signal probably had faint delayed enhancement, and was technically nonspecific. This lesion was probably present on 07/20/2018 although that noncontrast CT exam did not show this to  good effect. Given the hyperechogenicity and  enhanced through transmission on ultrasound, this was highly likely to be a hemangioma, but probably merits periodic sonographic surveillance in 6-12 months.   He has a smoking history.  Lung cancer screening chest CT on 08/01/2019 revealed Lung-RADS 3, probably benign findings. There was a new 4.5 mm basilar left lower lobe pulmonary nodule and a sable ectatic 4.1 cm ascending thoracic  He received the high-dose flu vaccine on 11/21/2019.  Symptomatically, he feels "good".  He denies any B symptoms.  He denies any hematuria.  Plan: 1.   Labs today: CBC with diff, CMP, ferritin. 2.   Myelodysplastic syndrome with ringed sideroblasts             He has a low grade myelodysplastic syndrome affecting only 1 cell line.  He has a low IPSS score.  Epo level was 63.3 (< 100) on 09/19/2019.  He began Aranesp on 10/17/2019.  Discuss plan to switch to Retacrit every 2 weeks secondary to insurance.  Retacrit today. 3.   Elevated ferritin             Ferritin was 1021 on 07/19/2019 and 641 on 08/15/2019. Sed rate and CRP low.             Iron saturation was 40% (high).        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   Patients do not typically manifest disease unless they have a rare mutation that is not tested.  Monitor ferritin every 3 months. 4.   Left mid kidney lesion  Interval abdomen and pelvis CT scan was reviewed.     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  Discuss interval consult with urology as well as interventional radiology.   Agree with surveillance. 5.   Retacrit today. 6.   RTC every 2 weeks x 3 for labs (CBC) +/- Retacrit. 7.   RTC in 6 weeks for MD assessment (CBC with diff, CMP, ferritin), and +/- Retacrit.  I discussed the assessment and treatment plan with the patient.  The patient was provided an opportunity to ask questions and all were answered.  The patient agreed with the plan and demonstrated an understanding of the  instructions.  The patient was advised to call back if the symptoms worsen or if the condition fails to improve as anticipated.  I provided 13 minutes of face-to-face time during this this encounter and > 50% was spent counseling as documented under my assessment and plan.  An additional 12 minutes were spent reviewing his chart (Epic and Care Everywhere) including notes, labs, and imaging studies.    Melissa C. Mike Gip, MD, PhD    12/12/2019, 2:00 PM  I, Mirian Mo Tufford, am acting as Education administrator for [employer]. Mike Gip, MD, PhD.  I, Melissa C. Mike Gip, MD, have reviewed the above documentation for accuracy and completeness, and I agree with the above.

## 2019-12-12 ENCOUNTER — Other Ambulatory Visit: Payer: Self-pay

## 2019-12-12 ENCOUNTER — Inpatient Hospital Stay: Payer: Medicare Other | Attending: Hematology and Oncology | Admitting: Hematology and Oncology

## 2019-12-12 ENCOUNTER — Inpatient Hospital Stay: Payer: Medicare Other

## 2019-12-12 ENCOUNTER — Encounter: Payer: Self-pay | Admitting: Hematology and Oncology

## 2019-12-12 ENCOUNTER — Telehealth: Payer: Self-pay | Admitting: *Deleted

## 2019-12-12 ENCOUNTER — Telehealth: Payer: Self-pay

## 2019-12-12 ENCOUNTER — Other Ambulatory Visit: Payer: Self-pay | Admitting: Hematology and Oncology

## 2019-12-12 VITALS — BP 140/66 | HR 78 | Temp 96.7°F | Resp 18 | Wt 266.8 lb

## 2019-12-12 DIAGNOSIS — R161 Splenomegaly, not elsewhere classified: Secondary | ICD-10-CM | POA: Insufficient documentation

## 2019-12-12 DIAGNOSIS — R7989 Other specified abnormal findings of blood chemistry: Secondary | ICD-10-CM

## 2019-12-12 DIAGNOSIS — D461 Refractory anemia with ring sideroblasts: Secondary | ICD-10-CM

## 2019-12-12 DIAGNOSIS — N289 Disorder of kidney and ureter, unspecified: Secondary | ICD-10-CM

## 2019-12-12 DIAGNOSIS — R911 Solitary pulmonary nodule: Secondary | ICD-10-CM | POA: Insufficient documentation

## 2019-12-12 LAB — COMPREHENSIVE METABOLIC PANEL
ALT: 16 U/L (ref 0–44)
AST: 19 U/L (ref 15–41)
Albumin: 4 g/dL (ref 3.5–5.0)
Alkaline Phosphatase: 37 U/L — ABNORMAL LOW (ref 38–126)
Anion gap: 6 (ref 5–15)
BUN: 15 mg/dL (ref 8–23)
CO2: 26 mmol/L (ref 22–32)
Calcium: 8.6 mg/dL — ABNORMAL LOW (ref 8.9–10.3)
Chloride: 105 mmol/L (ref 98–111)
Creatinine, Ser: 1.18 mg/dL (ref 0.61–1.24)
GFR, Estimated: >60 mL/min (ref >60)
Glucose, Bld: 98 mg/dL (ref 70–99)
Potassium: 4.5 mmol/L (ref 3.5–5.1)
Sodium: 137 mmol/L (ref 135–145)
Total Bilirubin: 1.2 mg/dL (ref 0.3–1.2)
Total Protein: 6.6 g/dL (ref 6.5–8.1)

## 2019-12-12 LAB — CBC WITH DIFFERENTIAL/PLATELET
Abs Immature Granulocytes: 0.04 10*3/uL (ref 0.00–0.07)
Basophils Absolute: 0.1 10*3/uL (ref 0.0–0.1)
Basophils Relative: 1 %
Eosinophils Absolute: 0.2 10*3/uL (ref 0.0–0.5)
Eosinophils Relative: 3 %
HCT: 28.3 % — ABNORMAL LOW (ref 39.0–52.0)
Hemoglobin: 9.3 g/dL — ABNORMAL LOW (ref 13.0–17.0)
Immature Granulocytes: 1 %
Lymphocytes Relative: 23 %
Lymphs Abs: 1.4 10*3/uL (ref 0.7–4.0)
MCH: 35 pg — ABNORMAL HIGH (ref 26.0–34.0)
MCHC: 32.9 g/dL (ref 30.0–36.0)
MCV: 106.4 fL — ABNORMAL HIGH (ref 80.0–100.0)
Monocytes Absolute: 0.6 10*3/uL (ref 0.1–1.0)
Monocytes Relative: 9 %
Neutro Abs: 4.1 10*3/uL (ref 1.7–7.7)
Neutrophils Relative %: 63 %
Platelets: 329 10*3/uL (ref 150–400)
RBC: 2.66 MIL/uL — ABNORMAL LOW (ref 4.22–5.81)
RDW: 19 % — ABNORMAL HIGH (ref 11.5–15.5)
WBC: 6.4 10*3/uL (ref 4.0–10.5)
nRBC: 1.1 % — ABNORMAL HIGH (ref 0.0–0.2)

## 2019-12-12 LAB — FERRITIN: Ferritin: 545 ng/mL — ABNORMAL HIGH (ref 24–336)

## 2019-12-12 MED ORDER — EPOETIN ALFA-EPBX 10000 UNIT/ML IJ SOLN
10000.0000 [IU] | Freq: Once | INTRAMUSCULAR | Status: AC
  Administered 2019-12-12: 10000 [IU] via SUBCUTANEOUS
  Filled 2019-12-12: qty 1

## 2019-12-12 NOTE — Progress Notes (Signed)
Patient would like to know if and when he can get the Covid booster (how much time should be between that and his aranesp).

## 2019-12-12 NOTE — Telephone Encounter (Signed)
Patient called asking when he can ger his COVID Vaccine Booster in relation to his MDS injections. Please advise

## 2019-12-12 NOTE — Telephone Encounter (Signed)
Patient aware to start taking 1200mg  of calcium a day and to get covid booster on off week of MDS shot

## 2019-12-12 NOTE — Telephone Encounter (Signed)
  Please call patient.  Patient gets his Retacrit every 2 weeks.  Can get the booster shot on the week off.  M

## 2019-12-12 NOTE — Telephone Encounter (Signed)
-----   Message from Lequita Asal, MD sent at 12/12/2019  4:42 PM EST ----- Regarding: RE: Please ensure patient on calcium  Please review standard calcium supplementation.  Message sent on vaccine.  M ----- Message ----- From: Drue Dun, RN Sent: 12/12/2019   4:24 PM EST To: Lequita Asal, MD Subject: RE: Please ensure patient on calcium           Patient does not take a calcium supplement but does drink milk per wife. He also wants to know if the covid booster will interfere with the MDS shot he gets every 2 weeks? ----- Message ----- From: Lequita Asal, MD Sent: 12/12/2019   4:18 PM EST To: Drue Dun, RN Subject: Please ensure patient on calcium                ----- Message ----- From: Buel Ream, [facility] [place] Sent: 12/12/2019   1:14 PM EST To: Lequita Asal, MD

## 2019-12-26 ENCOUNTER — Other Ambulatory Visit: Payer: Self-pay

## 2019-12-26 ENCOUNTER — Inpatient Hospital Stay: Payer: Medicare Other

## 2019-12-26 VITALS — BP 132/70 | HR 86 | Resp 18

## 2019-12-26 DIAGNOSIS — R7989 Other specified abnormal findings of blood chemistry: Secondary | ICD-10-CM

## 2019-12-26 DIAGNOSIS — D461 Refractory anemia with ring sideroblasts: Secondary | ICD-10-CM | POA: Diagnosis not present

## 2019-12-26 DIAGNOSIS — R161 Splenomegaly, not elsewhere classified: Secondary | ICD-10-CM | POA: Diagnosis not present

## 2019-12-26 DIAGNOSIS — R911 Solitary pulmonary nodule: Secondary | ICD-10-CM | POA: Diagnosis not present

## 2019-12-26 LAB — CBC WITH DIFFERENTIAL/PLATELET
Abs Immature Granulocytes: 0.07 10*3/uL (ref 0.00–0.07)
Basophils Absolute: 0 10*3/uL (ref 0.0–0.1)
Basophils Relative: 1 %
Eosinophils Absolute: 0.1 10*3/uL (ref 0.0–0.5)
Eosinophils Relative: 2 %
HCT: 27.6 % — ABNORMAL LOW (ref 39.0–52.0)
Hemoglobin: 9.1 g/dL — ABNORMAL LOW (ref 13.0–17.0)
Immature Granulocytes: 1 %
Lymphocytes Relative: 21 %
Lymphs Abs: 1.1 10*3/uL (ref 0.7–4.0)
MCH: 35.3 pg — ABNORMAL HIGH (ref 26.0–34.0)
MCHC: 33 g/dL (ref 30.0–36.0)
MCV: 107 fL — ABNORMAL HIGH (ref 80.0–100.0)
Monocytes Absolute: 0.5 10*3/uL (ref 0.1–1.0)
Monocytes Relative: 9 %
Neutro Abs: 3.5 10*3/uL (ref 1.7–7.7)
Neutrophils Relative %: 66 %
Platelets: 281 10*3/uL (ref 150–400)
RBC: 2.58 MIL/uL — ABNORMAL LOW (ref 4.22–5.81)
RDW: 18.9 % — ABNORMAL HIGH (ref 11.5–15.5)
WBC: 5.3 10*3/uL (ref 4.0–10.5)
nRBC: 1.3 % — ABNORMAL HIGH (ref 0.0–0.2)

## 2019-12-26 MED ORDER — EPOETIN ALFA-EPBX 10000 UNIT/ML IJ SOLN
10000.0000 [IU] | Freq: Once | INTRAMUSCULAR | Status: AC
  Administered 2019-12-26: 10000 [IU] via SUBCUTANEOUS

## 2019-12-31 DIAGNOSIS — N289 Disorder of kidney and ureter, unspecified: Secondary | ICD-10-CM | POA: Insufficient documentation

## 2020-01-04 ENCOUNTER — Other Ambulatory Visit: Payer: Self-pay

## 2020-01-09 ENCOUNTER — Inpatient Hospital Stay: Payer: Medicare Other

## 2020-01-09 ENCOUNTER — Inpatient Hospital Stay: Payer: Medicare Other | Attending: Hematology and Oncology

## 2020-01-09 ENCOUNTER — Other Ambulatory Visit: Payer: Self-pay

## 2020-01-09 VITALS — BP 129/84

## 2020-01-09 DIAGNOSIS — R7989 Other specified abnormal findings of blood chemistry: Secondary | ICD-10-CM

## 2020-01-09 DIAGNOSIS — D461 Refractory anemia with ring sideroblasts: Secondary | ICD-10-CM | POA: Insufficient documentation

## 2020-01-09 LAB — CBC WITH DIFFERENTIAL/PLATELET
Abs Immature Granulocytes: 0.06 10*3/uL (ref 0.00–0.07)
Basophils Absolute: 0 10*3/uL (ref 0.0–0.1)
Basophils Relative: 1 %
Eosinophils Absolute: 0.3 10*3/uL (ref 0.0–0.5)
Eosinophils Relative: 4 %
HCT: 29.4 % — ABNORMAL LOW (ref 39.0–52.0)
Hemoglobin: 9.5 g/dL — ABNORMAL LOW (ref 13.0–17.0)
Immature Granulocytes: 1 %
Lymphocytes Relative: 30 %
Lymphs Abs: 1.9 10*3/uL (ref 0.7–4.0)
MCH: 34.2 pg — ABNORMAL HIGH (ref 26.0–34.0)
MCHC: 32.3 g/dL (ref 30.0–36.0)
MCV: 105.8 fL — ABNORMAL HIGH (ref 80.0–100.0)
Monocytes Absolute: 0.5 10*3/uL (ref 0.1–1.0)
Monocytes Relative: 7 %
Neutro Abs: 3.8 10*3/uL (ref 1.7–7.7)
Neutrophils Relative %: 57 %
Platelets: 314 10*3/uL (ref 150–400)
RBC: 2.78 MIL/uL — ABNORMAL LOW (ref 4.22–5.81)
RDW: 18.8 % — ABNORMAL HIGH (ref 11.5–15.5)
WBC: 6.5 10*3/uL (ref 4.0–10.5)
nRBC: 0.6 % — ABNORMAL HIGH (ref 0.0–0.2)

## 2020-01-09 MED ORDER — EPOETIN ALFA-EPBX 10000 UNIT/ML IJ SOLN
10000.0000 [IU] | Freq: Once | INTRAMUSCULAR | Status: AC
  Administered 2020-01-09: 10000 [IU] via SUBCUTANEOUS

## 2020-01-22 NOTE — Progress Notes (Signed)
[facility]  [address] [place], [place] [address] Phone: [phone]  Fax: [phone]   Clinic Day:  01/23/2020  Referring physician: [name], MD  Chief Complaint: Brett Boone is a 74 y.o. male with a myelodysplastic syndrome with ringed sideroblasts who is seen for 6 week assessment.  HPI: The patient was last seen in the hematology clinic on 12/12/2019. At that time, he felt "good".  He denied any B symptoms.  He denied any hematuria. Hematocrit was 28.3, hemoglobin 9.3, MCV 106.4, platelets 329,000, WBC 6,400. Calcium was 8.6. Ferritin was 545. He received Retacrit 10,000 units.   CBC followed: 12/26/2019: Hematocrit 27.6, hemoglobin 9.1, MCV 107.0, platelets 281,000, WBC 5,300. 01/09/2020: Hematocrit 29.4, hemoglobin 9.5, MCV 105.8, platelets 314,000, WBC 6,500.  He received Retacrit 10,000 units on 12/26/2019 and 01/09/2020.  During the interim, he has been "good." He notes that after his first two doses of Retacrit, he felt "lifeless" the next day. After his last injection, he felt fine. He denies fevers or early satiety.   A week after his last Retacrit, he received the [employer]. He felt awful for 1 day.   Past Medical History:  Diagnosis Date  . Anemia   . Heart murmur   . Venous stasis dermatitis of both lower extremities     Past Surgical History:  Procedure Laterality Date  . CATARACT EXTRACTION W/PHACO Right 03/09/2017   Procedure: CATARACT EXTRACTION PHACO AND INTRAOCULAR LENS PLACEMENT ([place]) RIGHT;  Surgeon: Eulogio Bear, MD;  Location: [place];  Service: Ophthalmology;  Laterality: Right;  . COLONOSCOPY  2013   normal- cleared for 5 yrs- Dr Beckey Downing  . COLONOSCOPY WITH PROPOFOL N/A 02/17/2017   Procedure: COLONOSCOPY WITH PROPOFOL;  Surgeon: Lin Landsman, MD;  Location: [place];  Service: Endoscopy;  Laterality: N/A;  . COLONOSCOPY WITH PROPOFOL N/A  10/12/2019   Procedure: COLONOSCOPY WITH BIOPSY;  Surgeon: Lin Landsman, MD;  Location: [place];  Service: Endoscopy;  Laterality: N/A;  . EPIGASTRIC HERNIA REPAIR N/A 07/06/2014   Procedure: HERNIA REPAIR EPIGASTRIC ADULT;  Surgeon: Molly Maduro, MD;  Location: [facility];  Service: General;  Laterality: N/A;  . HERNIA REPAIR Bilateral   . INSERTION OF MESH N/A 07/06/2014   Procedure: INSERTION OF MESH;  Surgeon: Molly Maduro, MD;  Location: [facility];  Service: General;  Laterality: N/A;  . IR RADIOLOGIST EVAL & MGMT  11/07/2019  . POLYPECTOMY  02/17/2017   Procedure: POLYPECTOMY;  Surgeon: Lin Landsman, MD;  Location: [facility];  Service: Endoscopy;;  . POLYPECTOMY N/A 10/12/2019   Procedure: POLYPECTOMY;  Surgeon: Lin Landsman, MD;  Location: [place];  Service: Endoscopy;  Laterality: N/A;  . SHOULDER ARTHROSCOPY Left 2008  . SINUS EXPLORATION      Family History  Problem Relation Age of Onset  . Diabetes Father   . Heart disease Father 54  . Stroke Mother     Social History:  reports that he quit smoking about 20 months ago. His smoking use included cigarettes. He has a 12.50 pack-year smoking history. He has quit using smokeless tobacco. He reports current alcohol use. He reports that he does not use drugs. 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 The patient is alone today.  Allergies:  Allergies  Allergen Reactions  . Amoxicillin-Pot Clavulanate Hives and Nausea And Vomiting    Current Medications: Current  Outpatient Medications  Medication Sig Dispense Refill  . tadalafil (CIALIS) 5 MG tablet Take 5 mg by mouth daily as needed for erectile dysfunction.      No current facility-administered medications for this visit.    Review of Systems  Constitutional: Positive for weight loss (4 lbs). Negative for chills, diaphoresis, fever and malaise/fatigue.       Feels "good."   HENT: Negative for congestion, ear discharge, ear pain, hearing loss, nosebleeds, sinus pain, sore throat and tinnitus.   Eyes: Negative for blurred vision.  Respiratory: Negative for cough, hemoptysis, sputum production and shortness of breath.   Cardiovascular: Negative for chest pain, palpitations and leg swelling.  Gastrointestinal: Negative for abdominal pain, blood in stool, constipation, diarrhea, heartburn, melena, nausea and vomiting.  Genitourinary: Negative for dysuria, frequency, hematuria and urgency.  Musculoskeletal: Negative for back pain, joint pain, myalgias and neck pain.  Skin: Negative for itching and rash.  Neurological: Negative for dizziness, tingling, sensory change, weakness and headaches.  Endo/Heme/Allergies: Does not bruise/bleed easily.  Psychiatric/Behavioral: Negative for depression and memory loss. The patient is not nervous/anxious and does not have insomnia.   All other systems reviewed and are negative.  Performance status (ECOG): 0  Vitals Blood pressure 112/62, pulse 62, temperature 97.8 F (36.6 C), resp. rate 20, weight 262 lb 0.3 oz (118.9 kg), SpO2 99 %.   Physical Exam Vitals and nursing note reviewed.  Constitutional:      General: He is not in acute distress.    Appearance: He is not diaphoretic.  HENT:     Head: Normocephalic and atraumatic.     Comments: Gray hair.    Mouth/Throat:     Mouth: Mucous membranes are moist.     Pharynx: Oropharynx is clear.  Eyes:     General: No scleral icterus.    Extraocular Movements: Extraocular movements intact.     Conjunctiva/sclera: Conjunctivae normal.     Pupils: Pupils are equal, round, and reactive to light.     Comments: Blue eyes.  Cardiovascular:     Rate and Rhythm: Normal rate and regular rhythm.     Heart sounds: Normal heart sounds. No murmur heard.   Pulmonary:     Effort: Pulmonary effort is normal. No respiratory distress.     Breath sounds: Normal breath sounds. No  wheezing or rales.  Chest:     Chest wall: No tenderness.  Breasts:     Right: No axillary adenopathy or supraclavicular adenopathy.     Left: No axillary adenopathy or supraclavicular adenopathy.    Abdominal:     General: Bowel sounds are normal. There is no distension.     Palpations: Abdomen is soft. There is no mass.     Tenderness: There is no abdominal tenderness. There is no guarding or rebound.  Musculoskeletal:        General: No swelling or tenderness. Normal range of motion.     Cervical back: Normal range of motion and neck supple.  Lymphadenopathy:     Head:     Right side of head: No preauricular, posterior auricular or occipital adenopathy.     Left side of head: No preauricular, posterior auricular or occipital adenopathy.     Cervical: No cervical adenopathy.     Upper Body:     Right upper body: No supraclavicular or axillary adenopathy.     Left upper body: No supraclavicular or axillary adenopathy.     Lower Body: No right inguinal adenopathy. No left inguinal  adenopathy.  Skin:    General: Skin is warm and dry.     Comments: Chronic venous stasis changes in legs.  Neurological:     Mental Status: He is alert and oriented to person, place, and time. Mental status is at baseline.  Psychiatric:        Mood and Affect: Mood normal.        Behavior: Behavior normal.        Thought Content: Thought content normal.        Judgment: Judgment normal.    Appointment on 01/23/2020  Component Date Value Ref Range Status  . Sodium 01/23/2020 138  135 - 145 mmol/L Final  . Potassium 01/23/2020 4.6  3.5 - 5.1 mmol/L Final  . Chloride 01/23/2020 106  98 - 111 mmol/L Final  . CO2 01/23/2020 26  22 - 32 mmol/L Final  . Glucose, Bld 01/23/2020 98  70 - 99 mg/dL Final   Glucose reference range applies only to samples taken after fasting for at least 8 hours.  . BUN 01/23/2020 17  8 - 23 mg/dL Final  . Creatinine, Ser 01/23/2020 1.14  0.61 - 1.24 mg/dL Final  . Calcium  01/23/2020 8.9  8.9 - 10.3 mg/dL Final  . Total Protein 01/23/2020 6.3* 6.5 - 8.1 g/dL Final  . Albumin 01/23/2020 3.9  3.5 - 5.0 g/dL Final  . AST 01/23/2020 19  15 - 41 U/L Final  . ALT 01/23/2020 15  0 - 44 U/L Final  . Alkaline Phosphatase 01/23/2020 34* 38 - 126 U/L Final  . Total Bilirubin 01/23/2020 1.4* 0.3 - 1.2 mg/dL Final  . GFR, Estimated 01/23/2020 >60  >60 mL/min Final   Comment: (NOTE) Calculated using the CKD-EPI Creatinine Equation (2021)   . Anion gap 01/23/2020 6  5 - 15 Final   Performed at [facility], [address], [place], [place] [address]  . WBC 01/23/2020 6.5  4.0 - 10.5 K/uL Final  . RBC 01/23/2020 2.68* 4.22 - 5.81 MIL/uL Final  . Hemoglobin 01/23/2020 9.3* 13.0 - 17.0 g/dL Final  . HCT 01/23/2020 28.2* 39.0 - 52.0 % Final  . MCV 01/23/2020 105.2* 80.0 - 100.0 fL Final  . MCH 01/23/2020 34.7* 26.0 - 34.0 pg Final  . MCHC 01/23/2020 33.0  30.0 - 36.0 g/dL Final  . RDW 01/23/2020 19.0* 11.5 - 15.5 % Final  . Platelets 01/23/2020 303  150 - 400 K/uL Final  . nRBC 01/23/2020 0.6* 0.0 - 0.2 % Final  . Neutrophils Relative % 01/23/2020 65  % Final  . Neutro Abs 01/23/2020 4.3  1.7 - 7.7 K/uL Final  . Lymphocytes Relative 01/23/2020 23  % Final  . Lymphs Abs 01/23/2020 1.5  0.7 - 4.0 K/uL Final  . Monocytes Relative 01/23/2020 9  % Final  . Monocytes Absolute 01/23/2020 0.6  0.1 - 1.0 K/uL Final  . Eosinophils Relative 01/23/2020 2  % Final  . Eosinophils Absolute 01/23/2020 0.1  0.0 - 0.5 K/uL Final  . Basophils Relative 01/23/2020 1  % Final  . Basophils Absolute 01/23/2020 0.0  0.0 - 0.1 K/uL Final  . Immature Granulocytes 01/23/2020 0  % Final  . Abs Immature Granulocytes 01/23/2020 0.02  0.00 - 0.07 K/uL Final   Performed at [facility], [address] [place], [place] [address]    Assessment:  Brett Boone is a 74 y.o. 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  He presented with a macrocytic  anemia.  He denies any underlying liver disease.  He drinks socially.  Bone marrow biopsy on 09/11/2019 revealed macrocytic anemia and hypercellular bone marrow for age with dyspoietic changes primarily involving the erythroid cell lines associated with abundant ring sideroblasts (> 15%). There was no increase in blasts. Flow cytometry was negative. Cytogenetics revealed 45,X,-Y [20].  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 IPSS score is low.  CBC on 07/26/2019 revealed a hematocrit 28.5, hemoglobin 9.8, MCV 102, platelets 324,000, WBC 7200 with an ANC 4800.  Labs on 07/19/2019 included a ferritin of 1020, B12 of 1083 and folate 6.9.  Fecal occult blood was positive on 08/01/2019.   Work-up on 08/15/2019 revealed a  hematocrit of 29.0, hemoglobin 9.3, MCV 109.0, platelets 309,000, WBC 6,200. Ferritin was 641 with an iron saturation of 40% and a TIBC of 241. Reticulocyte count was 3.2%. TSH was 1.275 and free T4 was 0.85.  CRP was 0.6 and sed rate 24.  He patient received Aranesp on 10/17/2019 and 11/14/2019.  He switched to Retacrit on 12/12/2019 (last on 01/09/2020).  Ferritin has been followed: 1020 on 07/19/2019 and 641 on 08/15/2019.  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  Colonoscopy on 10/12/2019 revealed nine 3 to 7 mm polyps: 3 in the rectum (hyperplastic polyps), 5 in the ascending colon (sessile serrated polyps and tubular adenomas), and 1 in the cecum (serrated polyp). There was diverticulosis in the sigmoid colon. There were non-bleeding external hemorrhoids..  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 There was no tumor thrombus in the left renal vein or adenopathy. There was mild splenomegaly (12.8 x 7.2 x 10.4 cm). There was a complex 2.3 cm splenic lesion with high T2 signal and low  T1 signal probably had faint delayed enhancement, and was technically nonspecific. This lesion was probably present on 07/20/2018 although that noncontrast CT exam did not show this to good effect. 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   He has a smoking history.  Lung cancer screening chest CT on 08/01/2019 revealed Lung-RADS 3, probably benign findings. There was a new 4.5 mm basilar left lower lobe pulmonary nodule and a sable ectatic 4.1 cm ascending thoracic  He received the high-dose flu vaccine on 11/21/2019.  He received the [place] COVID-19 vaccine on 03/08/2019 and 03/29/2019. He received the [employer] on 01/10/2020.  Symptomatically, he feels "good".  He denies any fevers or sweats.  He has lost 4 pounds.  Exam is stable.  Plan: 1.   Labs today: CBC with diff, CMP, ferritin 2.   Myelodysplastic syndrome with ringed sideroblasts             Hematocrit 28.2.  Hemoglobin 9.3.  MCV 105.2 on 01/23/2020.    He has a low grade myelodysplastic syndrome affecting only 1 cell line.  He has a low IPSS score.  Epo level was 63.3 (< 100) on 09/19/2019.  He began Aranesp on 10/17/2019.  He switched to Retacrit on 12/12/2019.  Retacrit today. 3.   Elevated ferritin             Ferritin was 1021 on 07/19/2019 and 547 today. Sed rate and CRP low.  Iron saturation was 40% (high) on 08/15/2019.        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   Patients do not typically manifest disease unless they have a rare mutation that is not tested.  Continue to monitor ferritin every 3 months. 4.   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  Patient was seen by urology (Dr Diamantina Providence) and interventional  radiology (Dr Kathlene Cote).  Follow-up MRI is planned in 04/2020. 5.   Retacrit today. 6.   RTC every 2 weeks x 4 for labs (CBC) +/- Retacrit. 7.   RTC in 8 weeks for MD assessment (CBC with diff, CMP, ferritin, iron studies), and +/- Retacrit.  I discussed the assessment and treatment plan with the patient.  The patient was provided an opportunity to ask questions and all were answered.  The patient agreed with the plan and demonstrated an understanding of the instructions.  The patient was advised to call back if the symptoms worsen or if the condition fails to improve as anticipated.   Delicia Berens C. Mike Gip, MD, PhD    01/23/2020, 4:11 PM  I, Mirian Mo Tufford, am acting as Education administrator for [employer]. Mike Gip, MD, PhD.  I, Kord Monette C. Mike Gip, MD, have reviewed the above documentation for accuracy and completeness, and I agree with the above.

## 2020-01-23 ENCOUNTER — Inpatient Hospital Stay (HOSPITAL_BASED_OUTPATIENT_CLINIC_OR_DEPARTMENT_OTHER): Payer: Medicare Other | Admitting: Hematology and Oncology

## 2020-01-23 ENCOUNTER — Inpatient Hospital Stay: Payer: Medicare Other

## 2020-01-23 ENCOUNTER — Encounter: Payer: Self-pay | Admitting: Hematology and Oncology

## 2020-01-23 ENCOUNTER — Other Ambulatory Visit: Payer: Self-pay

## 2020-01-23 VITALS — BP 112/62 | HR 62 | Temp 97.8°F | Resp 20 | Wt 262.0 lb

## 2020-01-23 DIAGNOSIS — N289 Disorder of kidney and ureter, unspecified: Secondary | ICD-10-CM

## 2020-01-23 DIAGNOSIS — D461 Refractory anemia with ring sideroblasts: Secondary | ICD-10-CM

## 2020-01-23 DIAGNOSIS — R7989 Other specified abnormal findings of blood chemistry: Secondary | ICD-10-CM

## 2020-01-23 LAB — COMPREHENSIVE METABOLIC PANEL
ALT: 15 U/L (ref 0–44)
AST: 19 U/L (ref 15–41)
Albumin: 3.9 g/dL (ref 3.5–5.0)
Alkaline Phosphatase: 34 U/L — ABNORMAL LOW (ref 38–126)
Anion gap: 6 (ref 5–15)
BUN: 17 mg/dL (ref 8–23)
CO2: 26 mmol/L (ref 22–32)
Calcium: 8.9 mg/dL (ref 8.9–10.3)
Chloride: 106 mmol/L (ref 98–111)
Creatinine, Ser: 1.14 mg/dL (ref 0.61–1.24)
GFR, Estimated: >60 mL/min (ref >60)
Glucose, Bld: 98 mg/dL (ref 70–99)
Potassium: 4.6 mmol/L (ref 3.5–5.1)
Sodium: 138 mmol/L (ref 135–145)
Total Bilirubin: 1.4 mg/dL — ABNORMAL HIGH (ref 0.3–1.2)
Total Protein: 6.3 g/dL — ABNORMAL LOW (ref 6.5–8.1)

## 2020-01-23 LAB — CBC WITH DIFFERENTIAL/PLATELET
Abs Immature Granulocytes: 0.02 10*3/uL (ref 0.00–0.07)
Basophils Absolute: 0 10*3/uL (ref 0.0–0.1)
Basophils Relative: 1 %
Eosinophils Absolute: 0.1 10*3/uL (ref 0.0–0.5)
Eosinophils Relative: 2 %
HCT: 28.2 % — ABNORMAL LOW (ref 39.0–52.0)
Hemoglobin: 9.3 g/dL — ABNORMAL LOW (ref 13.0–17.0)
Immature Granulocytes: 0 %
Lymphocytes Relative: 23 %
Lymphs Abs: 1.5 10*3/uL (ref 0.7–4.0)
MCH: 34.7 pg — ABNORMAL HIGH (ref 26.0–34.0)
MCHC: 33 g/dL (ref 30.0–36.0)
MCV: 105.2 fL — ABNORMAL HIGH (ref 80.0–100.0)
Monocytes Absolute: 0.6 10*3/uL (ref 0.1–1.0)
Monocytes Relative: 9 %
Neutro Abs: 4.3 10*3/uL (ref 1.7–7.7)
Neutrophils Relative %: 65 %
Platelets: 303 10*3/uL (ref 150–400)
RBC: 2.68 MIL/uL — ABNORMAL LOW (ref 4.22–5.81)
RDW: 19 % — ABNORMAL HIGH (ref 11.5–15.5)
WBC: 6.5 10*3/uL (ref 4.0–10.5)
nRBC: 0.6 % — ABNORMAL HIGH (ref 0.0–0.2)

## 2020-01-23 LAB — FERRITIN: Ferritin: 547 ng/mL — ABNORMAL HIGH (ref 24–336)

## 2020-01-23 MED ORDER — EPOETIN ALFA-EPBX 10000 UNIT/ML IJ SOLN
10000.0000 [IU] | Freq: Once | INTRAMUSCULAR | Status: AC
  Administered 2020-01-23: 10000 [IU] via SUBCUTANEOUS
  Filled 2020-01-23: qty 1

## 2020-01-24 ENCOUNTER — Other Ambulatory Visit: Payer: Self-pay | Admitting: *Deleted

## 2020-01-24 DIAGNOSIS — R918 Other nonspecific abnormal finding of lung field: Secondary | ICD-10-CM

## 2020-01-24 DIAGNOSIS — Z87891 Personal history of nicotine dependence: Secondary | ICD-10-CM

## 2020-01-24 NOTE — Progress Notes (Signed)
Contacted and scheduled for LCS nodule follow up scan. Patient is a former smoker, quit 05/03/18, 30 pack year history.

## 2020-02-06 ENCOUNTER — Inpatient Hospital Stay: Payer: Medicare Other | Attending: Hematology and Oncology

## 2020-02-06 ENCOUNTER — Inpatient Hospital Stay: Payer: Medicare Other

## 2020-02-06 ENCOUNTER — Other Ambulatory Visit: Payer: Self-pay | Admitting: Hematology and Oncology

## 2020-02-06 ENCOUNTER — Other Ambulatory Visit: Payer: Self-pay

## 2020-02-06 VITALS — BP 128/77 | HR 67 | Temp 97.5°F | Resp 16

## 2020-02-06 DIAGNOSIS — R7989 Other specified abnormal findings of blood chemistry: Secondary | ICD-10-CM

## 2020-02-06 DIAGNOSIS — D461 Refractory anemia with ring sideroblasts: Secondary | ICD-10-CM | POA: Diagnosis not present

## 2020-02-06 LAB — CBC WITH DIFFERENTIAL/PLATELET
Abs Immature Granulocytes: 0.03 10*3/uL (ref 0.00–0.07)
Basophils Absolute: 0.1 10*3/uL (ref 0.0–0.1)
Basophils Relative: 1 %
Eosinophils Absolute: 0.2 10*3/uL (ref 0.0–0.5)
Eosinophils Relative: 4 %
HCT: 27.9 % — ABNORMAL LOW (ref 39.0–52.0)
Hemoglobin: 9.3 g/dL — ABNORMAL LOW (ref 13.0–17.0)
Immature Granulocytes: 1 %
Lymphocytes Relative: 28 %
Lymphs Abs: 1.5 10*3/uL (ref 0.7–4.0)
MCH: 35 pg — ABNORMAL HIGH (ref 26.0–34.0)
MCHC: 33.3 g/dL (ref 30.0–36.0)
MCV: 104.9 fL — ABNORMAL HIGH (ref 80.0–100.0)
Monocytes Absolute: 0.5 10*3/uL (ref 0.1–1.0)
Monocytes Relative: 10 %
Neutro Abs: 3 10*3/uL (ref 1.7–7.7)
Neutrophils Relative %: 56 %
Platelets: 305 10*3/uL (ref 150–400)
RBC: 2.66 MIL/uL — ABNORMAL LOW (ref 4.22–5.81)
RDW: 19.2 % — ABNORMAL HIGH (ref 11.5–15.5)
WBC: 5.4 10*3/uL (ref 4.0–10.5)
nRBC: 0.7 % — ABNORMAL HIGH (ref 0.0–0.2)

## 2020-02-06 MED ORDER — EPOETIN ALFA-EPBX 10000 UNIT/ML IJ SOLN
20000.0000 [IU] | Freq: Once | INTRAMUSCULAR | Status: AC
  Administered 2020-02-06: 20000 [IU] via SUBCUTANEOUS

## 2020-02-06 MED ORDER — EPOETIN ALFA-EPBX 40000 UNIT/ML IJ SOLN: 20000.0000 [IU] | Freq: Once | INTRAMUSCULAR | Status: DC

## 2020-02-07 ENCOUNTER — Ambulatory Visit (INDEPENDENT_AMBULATORY_CARE_PROVIDER_SITE_OTHER): Payer: Medicare Other

## 2020-02-07 DIAGNOSIS — Z0000 Encounter for general adult medical examination without abnormal findings: Secondary | ICD-10-CM | POA: Diagnosis not present

## 2020-02-07 NOTE — Progress Notes (Signed)
Subjective:   Brett Boone is a 75 y.o. male who presents for Medicare Annual/Subsequent preventive examination.  Virtual Visit via Telephone Note  I connected with  Brett Boone on 02/07/20 at 10:00 AM EST by telephone and verified that I am speaking with the correct person using two identifiers.  Location: Patient: home Provider: [facility] Persons participating in the virtual visit: patient & wife Brett Boone Health Advisor   I discussed the limitations, risks, security and privacy concerns of performing an evaluation and management service by telephone and the availability of in person appointments. The patient expressed understanding and agreed to proceed.  Interactive audio and video telecommunications were attempted between this nurse and patient, however failed, due to patient having technical difficulties OR patient did not have access to video capability.  We continued and completed visit with audio only.  Some vital signs may be absent or patient reported.   [name], LPN     Review of Systems     Cardiac Risk Factors include: advanced age (>15men, >6 women);male gender     Objective:    There were no vitals filed for this visit. There is no height or weight on file to calculate BMI.  Advanced Directives 02/07/2020 01/23/2020 10/12/2019 09/19/2019 09/11/2019 08/15/2019 02/06/2019  Does Patient Have a Medical Advance Directive? Yes Yes Yes No No No No  Type of Paramedic of [place];Living will [place];Living will - - - -  Does patient want to make changes to medical advance directive? Yes (MAU/Ambulatory/Procedural Areas - Information given) Yes (MAU/Ambulatory/Procedural Areas - Information given) No - Patient declined - - - -  Copy of Healthcare Power of Attorney in Chart? - - Yes - validated most recent copy scanned in chart (See row information) - - - -  Would patient like  information on creating a medical advance directive? - - - No - Patient declined No - Patient declined No - Patient declined Yes (MAU/Ambulatory/Procedural Areas - Information given)    Current Medications (verified) Outpatient Encounter Medications as of 02/07/2020  Medication Sig  . tadalafil (CIALIS) 5 MG tablet Take 5 mg by mouth daily as needed for erectile dysfunction.    No facility-administered encounter medications on file as of 02/07/2020.    Allergies (verified) Amoxicillin-pot clavulanate   History: Past Medical History:  Diagnosis Date  . Anemia   . Heart murmur   . Hydrocele   . Venous stasis dermatitis of both lower extremities    Past Surgical History:  Procedure Laterality Date  . CATARACT EXTRACTION W/PHACO Right 03/09/2017   Procedure: CATARACT EXTRACTION PHACO AND INTRAOCULAR LENS PLACEMENT ([place]) RIGHT;  Surgeon: Eulogio Bear, MD;  Location: [place];  Service: Ophthalmology;  Laterality: Right;  . COLONOSCOPY  2013   normal- cleared for 5 yrs- Dr Beckey Downing  . COLONOSCOPY WITH PROPOFOL N/A 02/17/2017   Procedure: COLONOSCOPY WITH PROPOFOL;  Surgeon: Lin Landsman, MD;  Location: [place];  Service: Endoscopy;  Laterality: N/A;  . COLONOSCOPY WITH PROPOFOL N/A 10/12/2019   Procedure: COLONOSCOPY WITH BIOPSY;  Surgeon: Lin Landsman, MD;  Location: [place];  Service: Endoscopy;  Laterality: N/A;  . EPIGASTRIC HERNIA REPAIR N/A 07/06/2014   Procedure: HERNIA REPAIR EPIGASTRIC ADULT;  Surgeon: Molly Maduro, MD;  Location: [facility];  Service: General;  Laterality: N/A;  . HERNIA REPAIR Bilateral   . INSERTION OF MESH N/A 07/06/2014   Procedure: INSERTION  OF MESH;  Surgeon: Molly Maduro, MD;  Location: [facility];  Service: General;  Laterality: N/A;  . IR RADIOLOGIST EVAL & MGMT  11/07/2019  . POLYPECTOMY  02/17/2017   Procedure: POLYPECTOMY;  Surgeon: Lin Landsman, MD;  Location: [place];   Service: Endoscopy;;  . POLYPECTOMY N/A 10/12/2019   Procedure: POLYPECTOMY;  Surgeon: Lin Landsman, MD;  Location: [place];  Service: Endoscopy;  Laterality: N/A;  . SHOULDER ARTHROSCOPY Left 2008  . SINUS EXPLORATION     Family History  Problem Relation Age of Onset  . Diabetes Father   . Heart disease Father 70  . Stroke Mother    Social History   Socioeconomic History  . Marital status: Married    Spouse name: Not on file  . Number of children: 3  . Years of education: Not on file  . Highest education level: Bachelor's degree (e.g., BA, AB, BS)  Occupational History  . Occupation: Retired  Tobacco Use  . Smoking status: Former Smoker    Packs/day: 0.25    Years: 50.00    Pack years: 12.50    Types: Cigarettes    Quit date: 05/03/2018    Years since quitting: 1.7  . Smokeless tobacco: Former Systems developer  . Tobacco comment: pt quit smoking  Vaping Use  . Vaping Use: Never used  Substance and Sexual Activity  . Alcohol use: Yes    Comment: Occasional -1x/month  . Drug use: No  . Sexual activity: Yes  Other Topics Concern  . Not on file  Social History Narrative  . Not on file   Social Determinants of Health   Financial Resource Strain: Low Risk   . Difficulty of Paying Living Expenses: Not hard at all  Food Insecurity: No Food Insecurity  . Worried About Charity fundraiser in the Last Year: Never true  . Ran Out of Food in the Last Year: Never true  Transportation Needs: No Transportation Needs  . Lack of Transportation (Medical): No  . Lack of Transportation (Non-Medical): No  Physical Activity: Inactive  . Days of Exercise per Week: 0 days  . Minutes of Exercise per Session: 0 min  Stress: No Stress Concern Present  . Feeling of Stress : Only a little  Social Connections: Moderately Integrated  . Frequency of Communication with Friends and Family: More than three times a week  . Frequency of Social Gatherings with Friends and Family: Once a  week  . Attends Religious Services: More than 4 times per year  . Active Member of Clubs or Organizations: No  . Attends Archivist Meetings: Never  . Marital Status: Married    Tobacco Counseling Counseling given: Not Answered Comment: pt quit smoking   Clinical Intake:  Pre-visit preparation completed: Yes  Pain : No/denies pain     Nutritional Risks: None Diabetes: No  How often do you need to have someone help you when you read instructions, pamphlets, or other written materials from your doctor or pharmacy?: 1 - Never    Interpreter Needed?: No  Information entered by :: [name] LPN   Activities of Daily Living In your present state of health, do you have any difficulty performing the following activities: 02/07/2020 10/12/2019  Hearing? Y -  Comment needs to replace hearing aids -  Vision? N N  Difficulty concentrating or making decisions? N N  Walking or climbing stairs? N N  Dressing or bathing? N N  Doing errands, shopping? N -  Preparing Food and eating ? N -  Using the Toilet? N -  In the past six months, have you accidently leaked urine? N -  Do you have problems with loss of bowel control? N -  Managing your Medications? N -  Managing your Finances? N -  Housekeeping or managing your Housekeeping? N -  Some recent data might be hidden    Patient Care Team: Duanne Limerick, MD as PCP - General (Family Medicine) Rosey Bath, MD as Referring Physician (Hematology and Oncology)  Indicate any recent Medical Services you may have received from other than [facility] providers in the past year (date may be approximate).     Assessment:   This is a routine wellness examination for [place].  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   Vision  Screening Comments: [facility]  Dietary issues and exercise activities discussed: Current Exercise Habits: The patient does not participate in regular exercise at present, Exercise limited by: None identified  Goals    . Have 3 meals a day     Eat 3 healthy meals per day and 2 healthy snacks per day    . Increase physical activity     Recommend increasing physical activity to at least 3 days per week     . Weight (lb) < 200 lb (90.7 kg)     Pt would like to lose 5% of weight over the next year with healthy eating and physical activity      Depression Screen PHQ 2/9 Scores 02/07/2020 07/26/2019 07/19/2019 06/07/2019 02/06/2019 12/15/2017 12/14/2016  PHQ - 2 Score 0 0 0 0 0 0 0  PHQ- 9 Score - 0 0 0 - - 0    Fall Risk Fall Risk  02/07/2020 07/26/2019 07/19/2019 06/07/2019 02/06/2019  Falls in the past year? 0 0 0 0 0  Comment - - - - -  Number falls in past yr: 0 - - - 0  Injury with Fall? 0 - - - 0  Comment - - - - -  Risk for fall due to : No Fall Risks - No Fall Risks - No Fall Risks  Follow up Falls prevention discussed Falls evaluation completed Falls evaluation completed Falls evaluation completed Falls prevention discussed    FALL RISK PREVENTION PERTAINING TO THE HOME:  Any stairs in or around the home? Yes  If so, are there any without handrails? No  Home free of loose throw rugs in walkways, pet beds, electrical cords, etc? Yes  Adequate lighting in your home to reduce risk of falls? Yes   ASSISTIVE DEVICES UTILIZED TO PREVENT FALLS:  Life alert? No  Use of a cane, walker or w/c? No  Grab bars in the bathroom? Yes  Shower chair or bench in shower? Yes Elevated toilet seat or a handicapped toilet? No   TIMED UP AND GO:  Was the test performed? No . Telephonic visit.   Cognitive Function: Normal cognitive status assessed by direct observation by this Nurse Health Advisor. No abnormalities found.       6CIT Screen 12/14/2016  What  Year? 0 points  What month? 0 points  What time? 0  points  Count back from 20 0 points  Months in reverse 0 points  Repeat phrase 0 points  Total Score 0    Immunizations Immunization History  Administered Date(s) Administered  . Fluad Quad(high Dose 65+) 11/14/2018, 11/21/2019  . Influenza, High Dose Seasonal PF 12/14/2016, 12/15/2017  . Influenza,inj,Quad PF,6+ Mos 10/22/2015  . Moderna Sars-Covid-2 Vaccination 01/04/2020  . PFIZER SARS-COV-2 Vaccination 03/08/2019, 03/29/2019  . Pneumococcal Conjugate-13 12/14/2016  . Pneumococcal Polysaccharide-23 08/30/2014  . Tdap 08/30/2014    TDAP status: Up to date  Flu Vaccine status: Up to date  Pneumococcal vaccine status: Up to date  Covid-19 vaccine status: Completed vaccines  Qualifies for Shingles Vaccine? Yes   Zostavax completed No   Shingrix Completed?: No.    Education has been provided regarding the importance of this vaccine. Patient has been advised to call insurance company to determine out of pocket expense if they have not yet received this vaccine. Advised may also receive vaccine at local pharmacy or Health Dept. Verbalized acceptance and understanding.  Screening Tests Health Maintenance  Topic Date Due  . COVID-19 Vaccine (4 - Booster) 07/04/2020  . COLONOSCOPY (Pts 45-66yrs Insurance coverage will need to be confirmed)  10/12/2022  . TETANUS/TDAP  08/29/2024  . INFLUENZA VACCINE  Completed  . Hepatitis C Screening  Completed  . PNA vac Low Risk Adult  Completed    Health Maintenance  There are no preventive care reminders to display for this patient.  Colorectal cancer screening: Type of screening: Colonoscopy. Completed 10/12/19. Repeat every 3 years  Lung Cancer Screening: (Low Dose CT Chest recommended if Age 23-80 years, 30 pack-year currently smoking OR have quit w/in 15years.) does qualify. Completed 08/01/19  Additional Screening:  Hepatitis C Screening: does qualify; Completed  12/31/16  Vision Screening: Recommended annual ophthalmology exams for early detection of glaucoma and other disorders of the eye. Is the patient up to date with their annual eye exam?  Yes  Who is the provider or what is the name of the office in which the patient attends annual eye exams? [facility]  Dental Screening: Recommended annual dental exams for proper oral hygiene  Community Resource Referral / Chronic Care Management: CRR required this visit?  No   CCM required this visit?  No      Plan:     I have personally reviewed and noted the following in the patient's chart:   . Medical and social history . Use of alcohol, tobacco or illicit drugs  . Current medications and supplements . Functional ability and status . Nutritional status . Physical activity . Advanced directives . List of other physicians . Hospitalizations, surgeries, and ER visits in previous 12 months . Vitals . Screenings to include cognitive, depression, and falls . Referrals and appointments  In addition, I have reviewed and discussed with patient certain preventive protocols, quality metrics, and best practice recommendations. A written personalized care plan for preventive services as well as general preventive health recommendations were provided to patient.     Reather Littler, LPN   [birth year]   Nurse Notes: none

## 2020-02-07 NOTE — Patient Instructions (Signed)
Brett Boone , Thank you for taking time to come for your Medicare Wellness Visit. I appreciate your ongoing commitment to your health goals. Please review the following plan we discussed and let me know if I can assist you in the future.   Screening recommendations/referrals: Colonoscopy: done 10/12/19. Repeat in 2024 Recommended yearly ophthalmology/optometry visit for glaucoma screening and checkup Recommended yearly dental visit for hygiene and checkup  Vaccinations: Influenza vaccine: done 11/21/19 Pneumococcal vaccine: done 12/14/16 Tdap vaccine: done 08/30/14 Shingles vaccine: Shingrix discussed. Please contact your pharmacy for coverage information.  Covid-19: done 03/08/19, 03/29/19 & 01/04/20  Advanced directives: Advance directive discussed with you today. I have provided a copy for you to complete at home and have notarized. Once this is complete please bring a copy in to our office so we can scan it into your chart.  Conditions/risks identified: Recommend increasing physical activity to at least 3 days per week   Next appointment: Follow up in one year for your annual wellness visit.   Preventive Care 74 Years and Older, Male Preventive care refers to lifestyle choices and visits with your health care provider that can promote health and wellness. What does preventive care include?  A yearly physical exam. This is also called an annual well check.  Dental exams once or twice a year.  Routine eye exams. Ask your health care provider how often you should have your eyes checked.  Personal lifestyle choices, including:  Daily care of your teeth and gums.  Regular physical activity.  Eating a healthy diet.  Avoiding tobacco and drug use.  Limiting alcohol use.  Practicing safe sex.  Taking low doses of aspirin every day.  Taking vitamin and mineral supplements as recommended by your health care provider. What happens during an annual well check? The services and  screenings done by your health care provider during your annual well check will depend on your age, overall health, lifestyle risk factors, and family history of disease. Counseling  Your health care provider may ask you questions about your:  Alcohol use.  Tobacco use.  Drug use.  Emotional well-being.  Home and relationship well-being.  Sexual activity.  Eating habits.  History of falls.  Memory and ability to understand (cognition).  Work and work Statistician. Screening  You may have the following tests or measurements:  Height, weight, and BMI.  Blood pressure.  Lipid and cholesterol levels. These may be checked every 5 years, or more frequently if you are over 38 years old.  Skin check.  Lung cancer screening. You may have this screening every year starting at age 74 if you have a 30-pack-year history of smoking and currently smoke or have quit within the past 15 years.  Fecal occult blood test (FOBT) of the stool. You may have this test every year starting at age 74  Flexible sigmoidoscopy or colonoscopy. You may have a sigmoidoscopy every 5 years or a colonoscopy every 10 years starting at age 74.  Prostate cancer screening. Recommendations will vary depending on your family history and other risks.  Hepatitis C blood test.  Hepatitis B blood test.  Sexually transmitted disease (STD) testing.  Diabetes screening. This is done by checking your blood sugar (glucose) after you have not eaten for a while (fasting). You may have this done every 1-3 years.  Abdominal aortic aneurysm (AAA) screening. You may need this if you are a current or former smoker.  Osteoporosis. You may be screened starting at age 74 if you  are at high risk. Talk with your health care provider about your test results, treatment options, and if necessary, the need for more tests. Vaccines  Your health care provider may recommend certain vaccines, such as:  Influenza vaccine. This is  recommended every year.  Tetanus, diphtheria, and acellular pertussis (Tdap, Td) vaccine. You may need a Td booster every 10 years.  Zoster vaccine. You may need this after age 74.  Pneumococcal 13-valent conjugate (PCV13) vaccine. One dose is recommended after age 74.  Pneumococcal polysaccharide (PPSV23) vaccine. One dose is recommended after age 74. Talk to your health care provider about which screenings and vaccines you need and how often you need them. This information is not intended to replace advice given to you by your health care provider. Make sure you discuss any questions you have with your health care provider. Document Released: 02/08/2015 Document Revised: 10/02/2015 Document Reviewed: 11/13/2014 Elsevier Interactive Patient Education  2017 [place] Prevention in the Home Falls can cause injuries. They can happen to people of all ages. There are many things you can do to make your home safe and to help prevent falls. What can I do on the outside of my home?  Regularly fix the edges of walkways and driveways and fix any cracks.  Remove anything that might make you trip as you walk through a door, such as a raised step or threshold.  Trim any bushes or trees on the path to your home.  Use bright outdoor lighting.  Clear any walking paths of anything that might make someone trip, such as rocks or tools.  Regularly check to see if handrails are loose or broken. Make sure that both sides of any steps have handrails.  Any raised decks and porches should have guardrails on the edges.  Have any leaves, snow, or ice cleared regularly.  Use sand or salt on walking paths during winter.  Clean up any spills in your garage right away. This includes oil or grease spills. What can I do in the bathroom?  Use night lights.  Install grab bars by the toilet and in the tub and shower. Do not use towel bars as grab bars.  Use non-skid mats or decals in the tub or  shower.  If you need to sit down in the shower, use a plastic, non-slip stool.  Keep the floor dry. Clean up any water that spills on the floor as soon as it happens.  Remove soap buildup in the tub or shower regularly.  Attach bath mats securely with double-sided non-slip rug tape.  Do not have throw rugs and other things on the floor that can make you trip. What can I do in the bedroom?  Use night lights.  Make sure that you have a light by your bed that is easy to reach.  Do not use any sheets or blankets that are too big for your bed. They should not hang down onto the floor.  Have a firm chair that has side arms. You can use this for support while you get dressed.  Do not have throw rugs and other things on the floor that can make you trip. What can I do in the kitchen?  Clean up any spills right away.  Avoid walking on wet floors.  Keep items that you use a lot in easy-to-reach places.  If you need to reach something above you, use a strong step stool that has a grab bar.  Keep electrical cords out  of the way.  Do not use floor polish or wax that makes floors slippery. If you must use wax, use non-skid floor wax.  Do not have throw rugs and other things on the floor that can make you trip. What can I do with my stairs?  Do not leave any items on the stairs.  Make sure that there are handrails on both sides of the stairs and use them. Fix handrails that are broken or loose. Make sure that handrails are as long as the stairways.  Check any carpeting to make sure that it is firmly attached to the stairs. Fix any carpet that is loose or worn.  Avoid having throw rugs at the top or bottom of the stairs. If you do have throw rugs, attach them to the floor with carpet tape.  Make sure that you have a light switch at the top of the stairs and the bottom of the stairs. If you do not have them, ask someone to add them for you. What else can I do to help prevent  falls?  Wear shoes that:  Do not have high heels.  Have rubber bottoms.  Are comfortable and fit you well.  Are closed at the toe. Do not wear sandals.  If you use a stepladder:  Make sure that it is fully opened. Do not climb a closed stepladder.  Make sure that both sides of the stepladder are locked into place.  Ask someone to hold it for you, if possible.  Clearly mark and make sure that you can see:  Any grab bars or handrails.  First and last steps.  Where the edge of each step is.  Use tools that help you move around (mobility aids) if they are needed. These include:  Canes.  Walkers.  Scooters.  Crutches.  Turn on the lights when you go into a dark area. Replace any light bulbs as soon as they burn out.  Set up your furniture so you have a clear path. Avoid moving your furniture around.  If any of your floors are uneven, fix them.  If there are any pets around you, be aware of where they are.  Review your medicines with your doctor. Some medicines can make you feel dizzy. This can increase your chance of falling. Ask your doctor what other things that you can do to help prevent falls. This information is not intended to replace advice given to you by your health care provider. Make sure you discuss any questions you have with your health care provider. Document Released: 11/08/2008 Document Revised: 06/20/2015 Document Reviewed: 02/16/2014 Elsevier Interactive Patient Education  2017 [employer].

## 2020-02-08 ENCOUNTER — Other Ambulatory Visit: Payer: Self-pay

## 2020-02-08 ENCOUNTER — Ambulatory Visit
Admission: RE | Admit: 2020-02-08 | Discharge: 2020-02-08 | Disposition: A | Discharge status: Home / Self Care | Payer: Medicare Other | Source: Ambulatory Visit | Attending: Nurse Practitioner | Admitting: Nurse Practitioner

## 2020-02-08 DIAGNOSIS — N62 Hypertrophy of breast: Secondary | ICD-10-CM | POA: Diagnosis not present

## 2020-02-08 DIAGNOSIS — Z87891 Personal history of nicotine dependence: Secondary | ICD-10-CM | POA: Diagnosis not present

## 2020-02-08 DIAGNOSIS — R918 Other nonspecific abnormal finding of lung field: Secondary | ICD-10-CM | POA: Insufficient documentation

## 2020-02-08 DIAGNOSIS — S2242XA Multiple fractures of ribs, left side, initial encounter for closed fracture: Secondary | ICD-10-CM | POA: Diagnosis not present

## 2020-02-08 DIAGNOSIS — J432 Centrilobular emphysema: Secondary | ICD-10-CM | POA: Diagnosis not present

## 2020-02-08 DIAGNOSIS — I712 Thoracic aortic aneurysm, without rupture: Secondary | ICD-10-CM | POA: Diagnosis not present

## 2020-02-08 IMAGING — CT CT CHEST LCS NODULE FOLLOW-UP W/O CM
2 of 5 series · 15 of 40 positions shown, 18 images · non-contrast
Comparison: [DATE]

CLINICAL DATA: Ex-smoker, quitting 1.5 years ago. History of COVID
in [DATE]. Follow-up of left lower lobe pulmonary nodule.

EXAM:
CT CHEST WITHOUT CONTRAST FOR LUNG CANCER SCREENING NODULE FOLLOW-UP
TECHNIQUE: Multidetector CT imaging of the chest was performed following the
standard protocol without IV contrast.

[Series 3: lung lcs f/u 1.00 · axial · 0.72mm/px · z∈[-1236,-918]mm · 12 of 352 slices shown, 15 images]
[im 17/352  mediastinal]
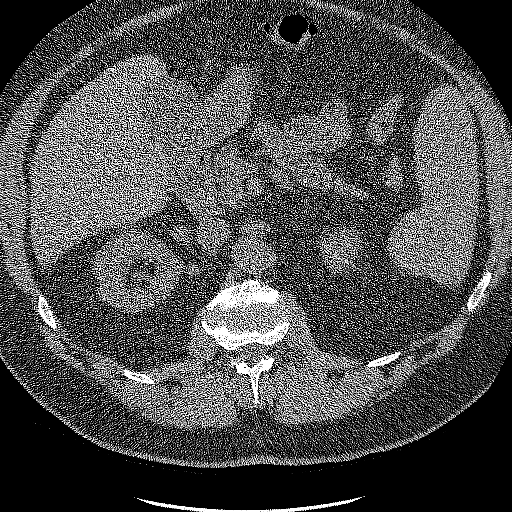
[im 17/352  lung]
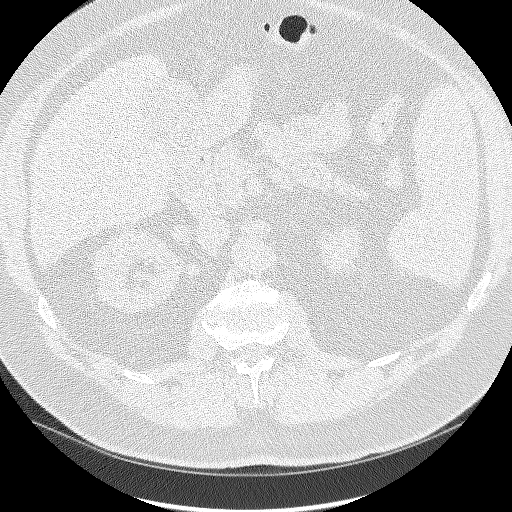
[im 51/352  lung]
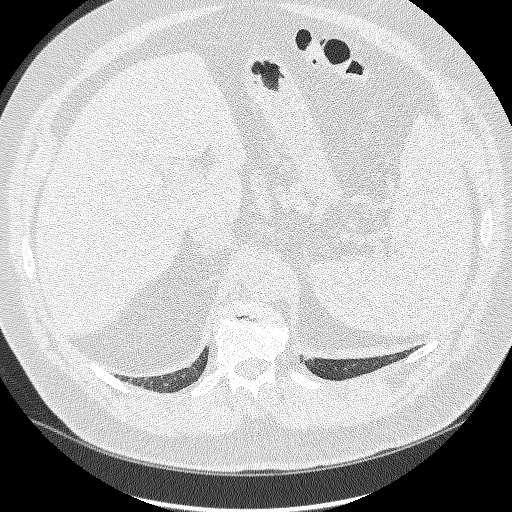
[im 84/352  lung]
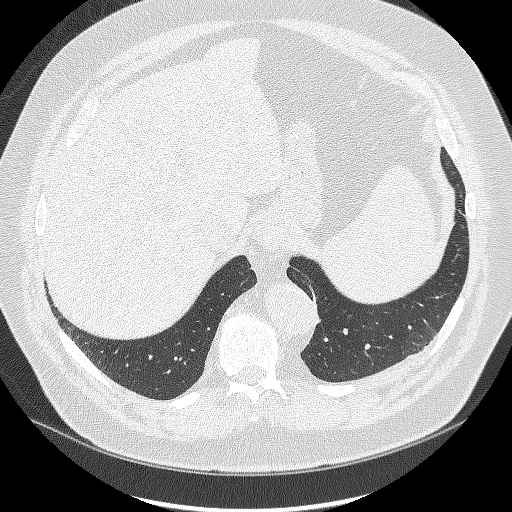
[im 101/352  lung]
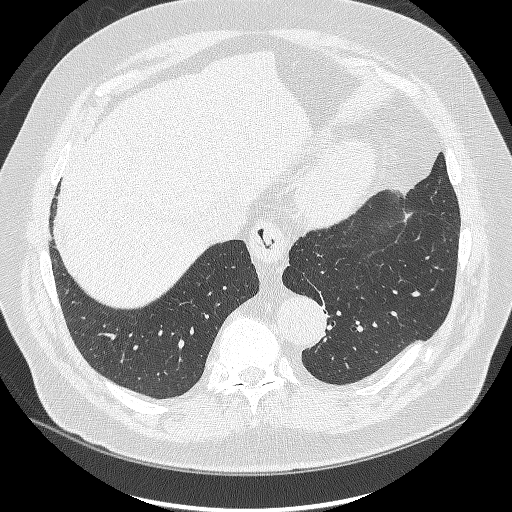
[im 134/352  mediastinal]
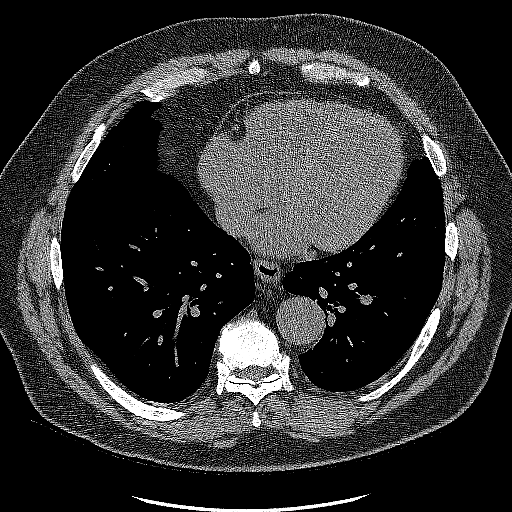
[im 134/352  lung]
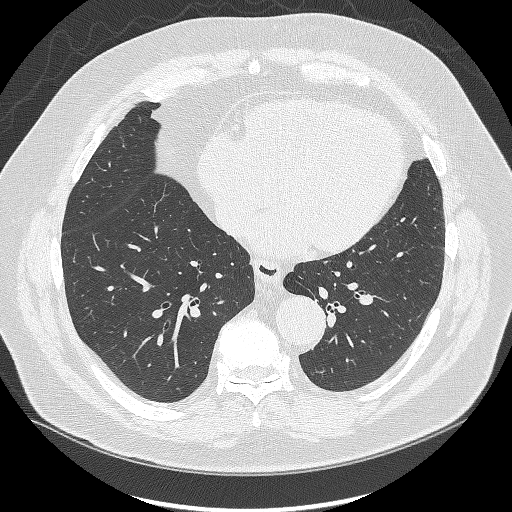
[im 168/352  lung]
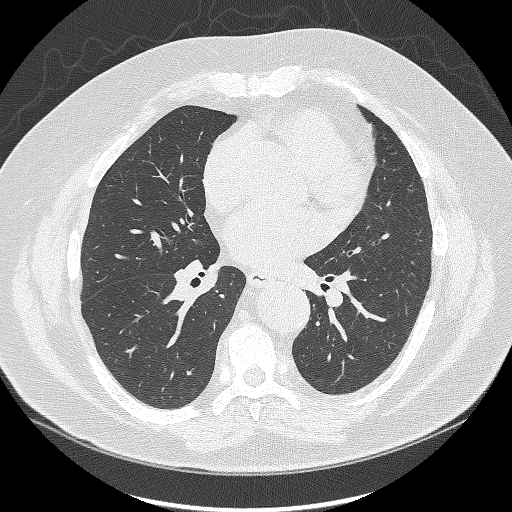
[im 184/352  lung]
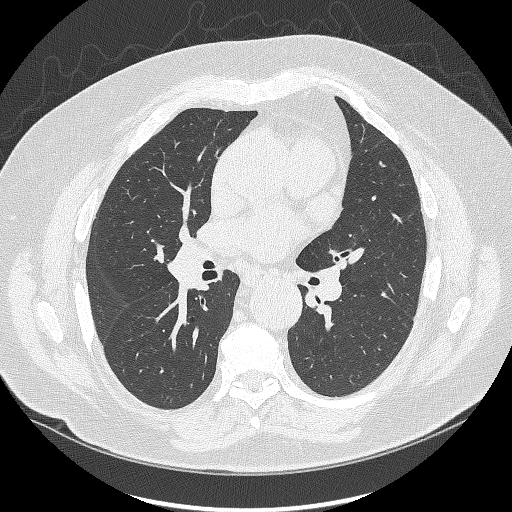
[im 218/352  lung]
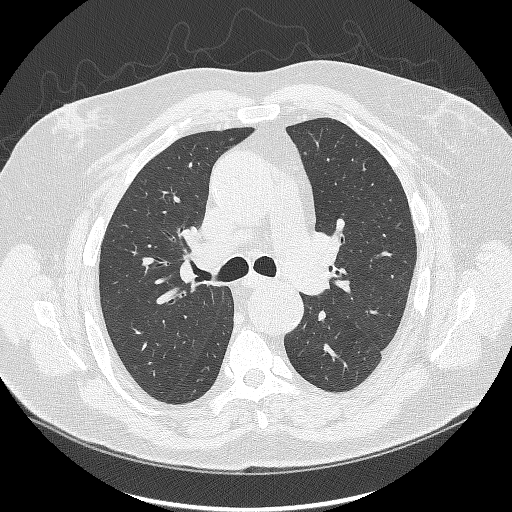
[im 251/352  mediastinal]
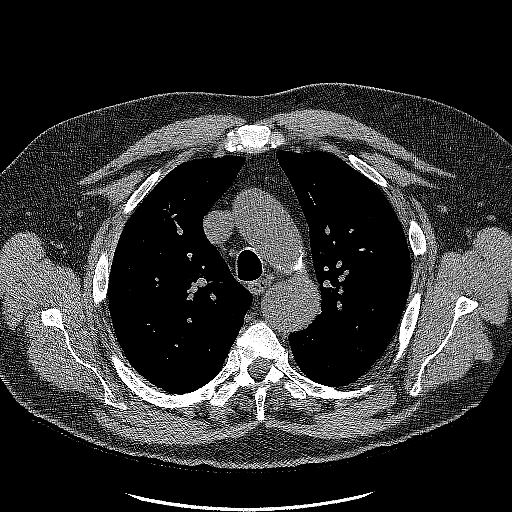
[im 251/352  lung]
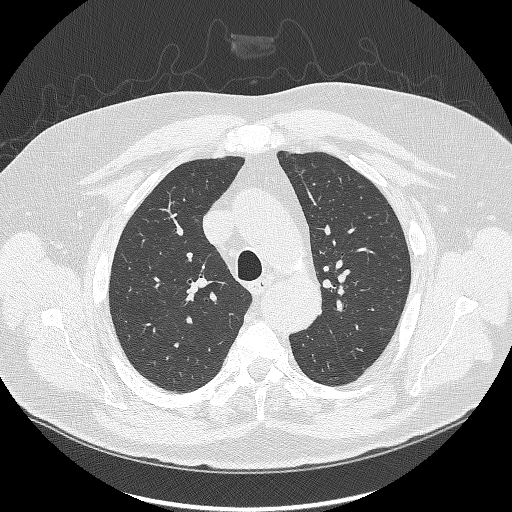
[im 268/352  lung]
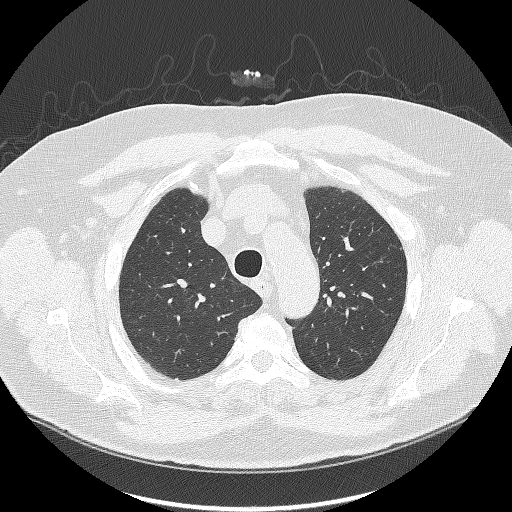
[im 301/352  lung]
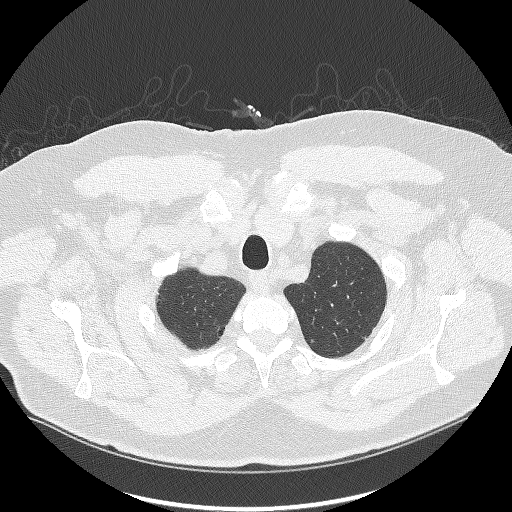
[im 335/352  lung]
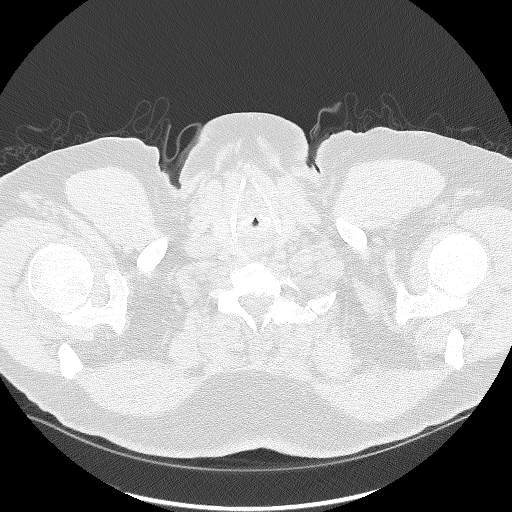

[Series 4: lcs f/u 1.00 cor · coronal · 0.69mm/px · 3 of 341 slices shown]
[im 69/341  lung]
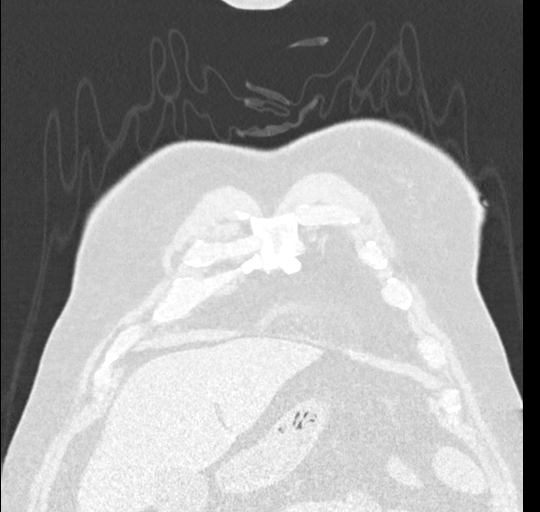
[im 137/341  lung]
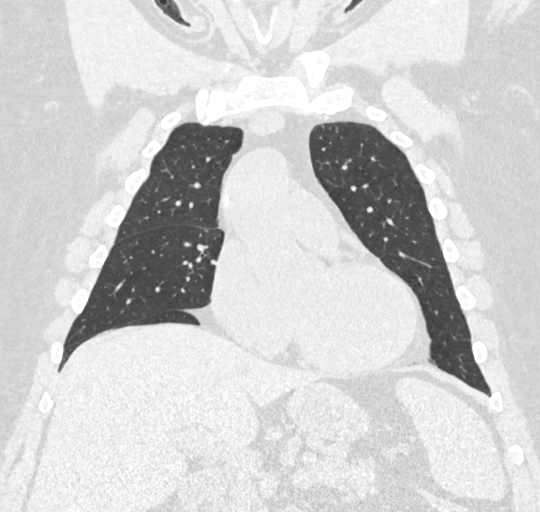
[im 205/341  lung]
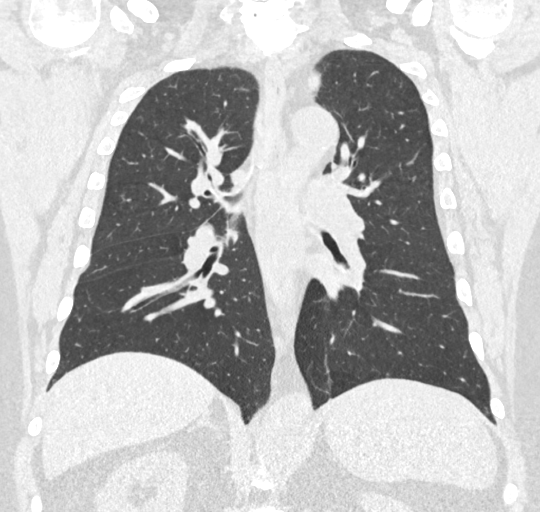

[15 of 40 positions shown; findings below may reference images not displayed]

FINDINGS: Cardiovascular: Aortic atherosclerosis. Ascending aortic dilatation,
4.5 cm on 32/2 transverse a. 4.3 cm on coronal reformats versus
cm on the prior exam.

Normal heart size, without pericardial effusion.

Mediastinum/Nodes: No mediastinal or definite hilar adenopathy,
given limitations of unenhanced CT. Tiny hiatal hernia.

Lungs/Pleura: No pleural fluid. Mild centrilobular emphysema. The
nodule of interest in the left lower lobe has decreased in size,
including at volume derived equivalent diameter 3.5 mm. Other
pulmonary nodules of maximally volume derived equivalent diameter
4.5 mm are not significantly changed.

Upper Abdomen: Normal imaged portions of the liver, spleen, and
pancreas, gallbladder, adrenal glands, kidneys.

Musculoskeletal: Moderate bilateral gynecomastia. Lower left rib
fractures are chronic. Tenth left rib fracture is displaced, without
significant healing.
IMPRESSION: 1. Lung-RADS 2, benign appearance or behavior. Continue annual
screening with low-dose chest CT without contrast in 12 months.
2. Aortic Atherosclerosis ([AS]-[AS]) and Emphysema ([AS]-[AS]).
3. Similar ascending aortic aneurysm, on the order of 4.5 cm. This
could be re-evaluated on follow-up screening CT at 1 year.

## 2020-02-14 ENCOUNTER — Encounter: Payer: Self-pay | Admitting: *Deleted

## 2020-02-19 ENCOUNTER — Other Ambulatory Visit: Payer: Self-pay

## 2020-02-19 DIAGNOSIS — D461 Refractory anemia with ring sideroblasts: Secondary | ICD-10-CM

## 2020-02-20 ENCOUNTER — Inpatient Hospital Stay: Payer: Medicare Other

## 2020-02-20 ENCOUNTER — Other Ambulatory Visit: Payer: Self-pay

## 2020-02-20 VITALS — BP 125/78 | HR 71 | Temp 96.8°F | Resp 16

## 2020-02-20 DIAGNOSIS — D461 Refractory anemia with ring sideroblasts: Secondary | ICD-10-CM

## 2020-02-20 LAB — CBC
HCT: 28.9 % — ABNORMAL LOW (ref 39.0–52.0)
Hemoglobin: 9.5 g/dL — ABNORMAL LOW (ref 13.0–17.0)
MCH: 35.1 pg — ABNORMAL HIGH (ref 26.0–34.0)
MCHC: 32.9 g/dL (ref 30.0–36.0)
MCV: 106.6 fL — ABNORMAL HIGH (ref 80.0–100.0)
Platelets: 278 10*3/uL (ref 150–400)
RBC: 2.71 MIL/uL — ABNORMAL LOW (ref 4.22–5.81)
RDW: 18.9 % — ABNORMAL HIGH (ref 11.5–15.5)
WBC: 5.8 10*3/uL (ref 4.0–10.5)
nRBC: 0.5 % — ABNORMAL HIGH (ref 0.0–0.2)

## 2020-02-20 MED ORDER — EPOETIN ALFA-EPBX 10000 UNIT/ML IJ SOLN
20000.0000 [IU] | Freq: Once | INTRAMUSCULAR | Status: AC
  Administered 2020-02-20: 20000 [IU] via SUBCUTANEOUS
  Filled 2020-02-20: qty 2

## 2020-03-05 ENCOUNTER — Inpatient Hospital Stay: Payer: Medicare Other

## 2020-03-05 ENCOUNTER — Other Ambulatory Visit: Payer: Self-pay

## 2020-03-05 ENCOUNTER — Inpatient Hospital Stay: Payer: Medicare Other | Attending: Hematology and Oncology

## 2020-03-05 VITALS — BP 119/74 | HR 77 | Resp 16

## 2020-03-05 DIAGNOSIS — D461 Refractory anemia with ring sideroblasts: Secondary | ICD-10-CM

## 2020-03-05 LAB — CBC
HCT: 29.2 % — ABNORMAL LOW (ref 39.0–52.0)
Hemoglobin: 9.6 g/dL — ABNORMAL LOW (ref 13.0–17.0)
MCH: 34.7 pg — ABNORMAL HIGH (ref 26.0–34.0)
MCHC: 32.9 g/dL (ref 30.0–36.0)
MCV: 105.4 fL — ABNORMAL HIGH (ref 80.0–100.0)
Platelets: 290 10*3/uL (ref 150–400)
RBC: 2.77 MIL/uL — ABNORMAL LOW (ref 4.22–5.81)
RDW: 18.7 % — ABNORMAL HIGH (ref 11.5–15.5)
WBC: 6.4 10*3/uL (ref 4.0–10.5)
nRBC: 0.5 % — ABNORMAL HIGH (ref 0.0–0.2)

## 2020-03-05 MED ORDER — EPOETIN ALFA-EPBX 10000 UNIT/ML IJ SOLN
20000.0000 [IU] | Freq: Once | INTRAMUSCULAR | Status: AC
  Administered 2020-03-05: 20000 [IU] via SUBCUTANEOUS
  Filled 2020-03-05: qty 2

## 2020-03-11 DIAGNOSIS — L821 Other seborrheic keratosis: Secondary | ICD-10-CM | POA: Diagnosis not present

## 2020-03-11 DIAGNOSIS — L578 Other skin changes due to chronic exposure to nonionizing radiation: Secondary | ICD-10-CM | POA: Diagnosis not present

## 2020-03-11 DIAGNOSIS — L570 Actinic keratosis: Secondary | ICD-10-CM | POA: Diagnosis not present

## 2020-03-18 ENCOUNTER — Other Ambulatory Visit: Payer: Self-pay

## 2020-03-18 ENCOUNTER — Other Ambulatory Visit: Payer: Self-pay | Admitting: *Deleted

## 2020-03-18 DIAGNOSIS — D461 Refractory anemia with ring sideroblasts: Secondary | ICD-10-CM

## 2020-03-18 DIAGNOSIS — R7989 Other specified abnormal findings of blood chemistry: Secondary | ICD-10-CM

## 2020-03-18 NOTE — Progress Notes (Signed)
[facility]  [address] [place], [place] [address] Phone: [phone]  Fax: [phone]   Clinic Day:  03/19/2020  Referring physician: [name], MD  Chief Complaint: Brett Boone is a 75 y.o. male with a myelodysplastic syndrome with ringed sideroblasts who is seen for 2 month assessment.  HPI: The patient was last seen in the hematology clinic on 01/23/2020. At that time, he felt "good".  He denied any fevers or sweats.  He had lost 4 pounds.  Exam was stable. Hematocrit was 28.2, hemoglobin 9.3, MCV 105.2, platelets 303,000, WBC 6,500. Ferritin was 547. Total protein was 6.3. Alkaline phosphatase was 34. Bilirubin was 1.4. He received Retacrit 10,000 units.  Low dose chest CT on 02/08/2020 revealed Lung-RADS 2, benign appearance or behavior. Continue annual screening with low-dose chest CT without contrast in 12 months. 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 There was a similar ascending aortic aneurysm, on the order of 4.5 cm. This could be re-evaluated on follow-up screening CT at 1 year.  Labs followed: 02/06/2020: Hematocrit 27.9, hemoglobin 9.3, MCV 104.9, platelets 305,000, WBC 5,400. 02/20/2020: Hematocrit 28.9, hemoglobin 9.5, MCV 106.6, platelets 278,000, WBC 5,800. 03/05/2020: Hematocrit 29.2, hemoglobin 9.6, MCV 105.4, platelets 290,000, WBC 6,400.  He received Retacrit 20,000 units biweekly x 3 (02/06/2020 - 03/05/2020).  During the interim, he has been "fine." He denies fevers and sweats. He has decreased his carbohydrate intake and is intentionally losing weight. His goal weight is 215 lbs.  The patient takes calcium. He does not take potassium.  He is having a follow-up MRI of his kidney in 03/2020.   Past Medical History:  Diagnosis Date  . Anemia   . Heart murmur   . Hydrocele   . Venous stasis dermatitis of both lower extremities     Past Surgical History:  Procedure Laterality Date  . CATARACT  EXTRACTION W/PHACO Right 03/09/2017   Procedure: CATARACT EXTRACTION PHACO AND INTRAOCULAR LENS PLACEMENT ([place]) RIGHT;  Surgeon: Eulogio Bear, MD;  Location: [facility];  Service: Ophthalmology;  Laterality: Right;  . COLONOSCOPY  2013   normal- cleared for 5 yrs- Dr Beckey Downing  . COLONOSCOPY WITH PROPOFOL N/A 02/17/2017   Procedure: COLONOSCOPY WITH PROPOFOL;  Surgeon: Lin Landsman, MD;  Location: [place];  Service: Endoscopy;  Laterality: N/A;  . COLONOSCOPY WITH PROPOFOL N/A 10/12/2019   Procedure: COLONOSCOPY WITH BIOPSY;  Surgeon: Lin Landsman, MD;  Location: [place];  Service: Endoscopy;  Laterality: N/A;  . EPIGASTRIC HERNIA REPAIR N/A 07/06/2014   Procedure: HERNIA REPAIR EPIGASTRIC ADULT;  Surgeon: Molly Maduro, MD;  Location: [facility];  Service: General;  Laterality: N/A;  . HERNIA REPAIR Bilateral   . INSERTION OF MESH N/A 07/06/2014   Procedure: INSERTION OF MESH;  Surgeon: Molly Maduro, MD;  Location: [facility];  Service: General;  Laterality: N/A;  . IR RADIOLOGIST EVAL & MGMT  11/07/2019  . POLYPECTOMY  02/17/2017   Procedure: POLYPECTOMY;  Surgeon: Lin Landsman, MD;  Location: [place];  Service: Endoscopy;;  . POLYPECTOMY N/A 10/12/2019   Procedure: POLYPECTOMY;  Surgeon: Lin Landsman, MD;  Location: [place];  Service: Endoscopy;  Laterality: N/A;  . SHOULDER ARTHROSCOPY Left 2008  . SINUS EXPLORATION      Family History  Problem Relation Age of Onset  . Diabetes Father   . Heart disease Father 38  . Stroke Mother     Social History:  reports that  he quit smoking about 22 months ago. His smoking use included cigarettes. He has a 12.50 pack-year smoking history. He has quit using smokeless tobacco. He reports current alcohol use. He reports that he does not use drugs. 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 The patient is alone  today.  Allergies:  Allergies  Allergen Reactions  . Amoxicillin-Pot Clavulanate Hives and Nausea And Vomiting    Current Medications: Current Outpatient Medications  Medication Sig Dispense Refill  . Calcium Carbonate-Vit D-Min (CALCIUM 1200 PO) Take by mouth.    . tadalafil (CIALIS) 5 MG tablet Take 5 mg by mouth daily as needed for erectile dysfunction.      No current facility-administered medications for this visit.    Review of Systems  Constitutional: Positive for weight loss (11 lbs, intentional, goal weight 215). Negative for chills, diaphoresis, fever and malaise/fatigue.       Feels "fine."  HENT: Negative for congestion, ear discharge, ear pain, hearing loss, nosebleeds, sinus pain, sore throat and tinnitus.   Eyes: Negative for blurred vision.  Respiratory: Negative for cough, hemoptysis, sputum production and shortness of breath.   Cardiovascular: Negative for chest pain, palpitations and leg swelling.  Gastrointestinal: Negative for abdominal pain, blood in stool, constipation, diarrhea, heartburn, melena, nausea and vomiting.  Genitourinary: Negative for dysuria, frequency, hematuria and urgency.  Musculoskeletal: Negative for back pain, joint pain, myalgias and neck pain.  Skin: Negative for itching and rash.  Neurological: Negative for dizziness, tingling, sensory change, weakness and headaches.  Endo/Heme/Allergies: Does not bruise/bleed easily.  Psychiatric/Behavioral: Negative for depression and memory loss. The patient is not nervous/anxious and does not have insomnia.   All other systems reviewed and are negative.  Performance status (ECOG): 0  Vitals Blood pressure (!) 142/64, pulse 65, temperature (!) 96.8 F (36 C), temperature source Tympanic, resp. rate 18, weight 251 lb 5.2 oz (114 kg), SpO2 100 %.   Physical Exam Vitals and nursing note reviewed.  Constitutional:      General: He is not in acute distress.    Appearance: He is not diaphoretic.   HENT:     Head: Normocephalic and atraumatic.     Comments: Gray hair.    Mouth/Throat:     Mouth: Mucous membranes are moist.     Pharynx: Oropharynx is clear.  Eyes:     General: No scleral icterus.    Extraocular Movements: Extraocular movements intact.     Conjunctiva/sclera: Conjunctivae normal.     Pupils: Pupils are equal, round, and reactive to light.     Comments: Blue eyes.  Cardiovascular:     Rate and Rhythm: Normal rate and regular rhythm.     Heart sounds: Normal heart sounds. No murmur heard.   Pulmonary:     Effort: Pulmonary effort is normal. No respiratory distress.     Breath sounds: Normal breath sounds. No wheezing or rales.  Chest:     Chest wall: No tenderness.  Breasts:     Right: No axillary adenopathy or supraclavicular adenopathy.     Left: No axillary adenopathy or supraclavicular adenopathy.    Abdominal:     General: Bowel sounds are normal. There is no distension.     Palpations: Abdomen is soft. There is no mass.     Tenderness: There is no abdominal tenderness. There is no guarding or rebound.  Musculoskeletal:        General: No swelling or tenderness. Normal range  of motion.     Cervical back: Normal range of motion and neck supple.  Lymphadenopathy:     Head:     Right side of head: No preauricular, posterior auricular or occipital adenopathy.     Left side of head: No preauricular, posterior auricular or occipital adenopathy.     Cervical: No cervical adenopathy.     Upper Body:     Right upper body: No supraclavicular or axillary adenopathy.     Left upper body: No supraclavicular or axillary adenopathy.     Lower Body: No right inguinal adenopathy. No left inguinal adenopathy.  Skin:    General: Skin is warm and dry.     Comments: Chronic venous stasis changes in legs.  Neurological:     Mental Status: He is alert and oriented to person, place, and time. Mental status is at baseline.  Psychiatric:        Mood and Affect: Mood  normal.        Behavior: Behavior normal.        Thought Content: Thought content normal.        Judgment: Judgment normal.    Appointment on 03/19/2020  Component Date Value Ref Range Status  . Sodium 03/19/2020 135  135 - 145 mmol/L Final  . Potassium 03/19/2020 5.1  3.5 - 5.1 mmol/L Final  . Chloride 03/19/2020 100  98 - 111 mmol/L Final  . CO2 03/19/2020 28  22 - 32 mmol/L Final  . Glucose, Bld 03/19/2020 126* 70 - 99 mg/dL Final   Glucose reference range applies only to samples taken after fasting for at least 8 hours.  . BUN 03/19/2020 21  8 - 23 mg/dL Final  . Creatinine, Ser 03/19/2020 1.24  0.61 - 1.24 mg/dL Final  . Calcium 03/19/2020 9.5  8.9 - 10.3 mg/dL Final  . Total Protein 03/19/2020 7.1  6.5 - 8.1 g/dL Final  . Albumin 03/19/2020 4.4  3.5 - 5.0 g/dL Final  . AST 03/19/2020 24  15 - 41 U/L Final  . ALT 03/19/2020 18  0 - 44 U/L Final  . Alkaline Phosphatase 03/19/2020 38  38 - 126 U/L Final  . Total Bilirubin 03/19/2020 1.6* 0.3 - 1.2 mg/dL Final  . GFR, Estimated 03/19/2020 >60  >60 mL/min Final   Comment: (NOTE) Calculated using the CKD-EPI Creatinine Equation (2021)   . Anion gap 03/19/2020 7  5 - 15 Final   Performed at [facility], [address], [place], [place] [address]  . WBC 03/19/2020 5.8  4.0 - 10.5 K/uL Final  . RBC 03/19/2020 2.69* 4.22 - 5.81 MIL/uL Final  . Hemoglobin 03/19/2020 9.4* 13.0 - 17.0 g/dL Final  . HCT 03/19/2020 28.1* 39.0 - 52.0 % Final  . MCV 03/19/2020 104.5* 80.0 - 100.0 fL Final  . MCH 03/19/2020 34.9* 26.0 - 34.0 pg Final  . MCHC 03/19/2020 33.5  30.0 - 36.0 g/dL Final  . RDW 03/19/2020 18.2* 11.5 - 15.5 % Final  . Platelets 03/19/2020 329  150 - 400 K/uL Final  . nRBC 03/19/2020 0.5* 0.0 - 0.2 % Final  . Neutrophils Relative % 03/19/2020 65  % Final  . Neutro Abs 03/19/2020 3.8  1.7 - 7.7 K/uL Final  . Lymphocytes Relative 03/19/2020 24  % Final  . Lymphs Abs 03/19/2020 1.4  0.7 - 4.0 K/uL Final  . Monocytes  Relative 03/19/2020 9  % Final  . Monocytes Absolute 03/19/2020 0.5  0.1 - 1.0 K/uL Final  .  Eosinophils Relative 03/19/2020 1  % Final  . Eosinophils Absolute 03/19/2020 0.1  0.0 - 0.5 K/uL Final  . Basophils Relative 03/19/2020 1  % Final  . Basophils Absolute 03/19/2020 0.1  0.0 - 0.1 K/uL Final  . Immature Granulocytes 03/19/2020 0  % Final  . Abs Immature Granulocytes 03/19/2020 0.02  0.00 - 0.07 K/uL Final   Performed at [facility], [address] [place], [place] [address]    Assessment:  Brett Boone is a 75 y.o. 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  He presented with a macrocytic anemia.  He denies any underlying liver disease.  He drinks socially.  Bone marrow biopsy on 09/11/2019 revealed macrocytic anemia and hypercellular bone marrow for age with dyspoietic changes primarily involving the erythroid cell lines associated with abundant ring sideroblasts (> 15%). There was no increase in blasts. Flow cytometry was negative. Cytogenetics revealed 45,X,-Y [20].  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 IPSS score is low.  CBC on 07/26/2019 revealed a hematocrit 28.5, hemoglobin 9.8, MCV 102, platelets 324,000, WBC 7200 with an ANC 4800.  Labs on 07/19/2019 included a ferritin of 1020, B12 of 1083 and folate 6.9.  Fecal occult blood was positive on 08/01/2019.   Work-up on 08/15/2019 revealed a  hematocrit of 29.0, hemoglobin 9.3, MCV 109.0, platelets 309,000, WBC 6,200. Ferritin was 641 with an iron saturation of 40% and a TIBC of 241. Reticulocyte count was 3.2%. TSH was 1.275 and free T4 was 0.85.  CRP was 0.6 and sed rate 24.  He patient received Aranesp on 10/17/2019 and 11/14/2019.  He switched to Retacrit on 12/12/2019 (last on 03/05/2020).  Retacrit was increased from 10,000 units to 20,000 units on 02/06/2020.  Ferritin has been followed: 1020 on 07/19/2019 and 641 on 08/15/2019.   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  Colonoscopy on 10/12/2019 revealed nine 3 to 7 mm polyps: 3 in the rectum (hyperplastic polyps), 5 in the ascending colon (sessile serrated polyps and tubular adenomas), and 1 in the cecum (serrated polyp). There was diverticulosis in the sigmoid colon. There were non-bleeding external hemorrhoids..  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 There was no tumor thrombus in the left renal vein or adenopathy. There was mild splenomegaly (12.8 x 7.2 x 10.4 cm). There was a complex 2.3 cm splenic lesion with high T2 signal and low T1 signal probably had faint delayed enhancement, and was technically nonspecific. This lesion was probably present on 07/20/2018 although that noncontrast CT exam did not show this to good effect. 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   He has a smoking history.  Lung cancer screening chest CT on 08/01/2019 revealed Lung-RADS 3, probably benign findings. There was a new 4.5 mm basilar left lower lobe pulmonary nodule and a sable ectatic 4.1 cm ascending thoracic  He received the high-dose flu vaccine on 11/21/2019.  He received the [place] COVID-19 vaccine on 03/08/2019 and 03/29/2019. He received the [employer] on 01/10/2020.  Symptomatically,  he has been "fine." He denies fevers and sweats. He has decreased his carbohydrate intake and is intentionally losing weight.  Exam is stable.  Bilirubin is 1.6 (direct 0.1).  Plan: 1.  Labs today: CBC with diff, CMP, ferritin, iron studies, direct bilirubin. 2.   Myelodysplastic syndrome with ringed sideroblasts             Hematocrit 28.1.  Hemoglobin 9.4.  MCV 104.5 on 03/19/2020.     He has a low grade myelodysplastic syndrome affecting only 1 cell line.  He has a low IPSS score.  Epo level was 63.3 (< 100) on 09/19/2019.  He began Aranesp on 10/17/2019.  He switched to Retacrit on 12/12/2019.  Continue Retacrit per protocol. 3.   Elevated ferritin             Ferritin was 1021 on 07/19/2019 and 850 today. Sed rate and CRP low.             Iron saturation was 58 % (high) today.        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   Patients do not typically manifest disease unless they have a rare mutation that is not tested.  Continue to monitor ferritin every 3 months as ferritin appears to be declining. 4.   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  Patient was seen by urology (Dr Diamantina Providence) and interventional radiology (Dr Kathlene Cote).  Follow-up MRI is planned in 03/2020. 5.   Retacrit today. 6.   RTC every 2 weeks x 6 for labs (CBC) +/- Retacrit. 7.   RTC in 12 weeks for MD assess (CBC with diff, CMP, ferritin, iron studies), and +/- Retacrit.  I discussed the assessment and treatment plan with the patient.  The patient was provided an opportunity to ask questions and all were answered.  The patient agreed with the plan and demonstrated an understanding of the instructions.  The patient was advised to call back if the symptoms worsen or if the condition fails to improve as anticipated.   Adalae Baysinger C. Mike Gip, MD, PhD    03/19/2020, 1:27 PM  I, Mirian Mo Tufford, am acting as Education administrator for [employer]. Mike Gip, MD, PhD.  I, Rica Heather C. Mike Gip, MD, have reviewed the above documentation for accuracy and completeness, and I agree with the above.

## 2020-03-19 ENCOUNTER — Other Ambulatory Visit: Payer: Self-pay

## 2020-03-19 ENCOUNTER — Inpatient Hospital Stay: Payer: Medicare Other

## 2020-03-19 ENCOUNTER — Encounter: Payer: Self-pay | Admitting: Hematology and Oncology

## 2020-03-19 ENCOUNTER — Inpatient Hospital Stay: Payer: Medicare Other | Admitting: Hematology and Oncology

## 2020-03-19 VITALS — BP 142/64 | HR 65 | Temp 96.8°F | Resp 18 | Wt 251.3 lb

## 2020-03-19 DIAGNOSIS — N289 Disorder of kidney and ureter, unspecified: Secondary | ICD-10-CM

## 2020-03-19 DIAGNOSIS — D461 Refractory anemia with ring sideroblasts: Secondary | ICD-10-CM

## 2020-03-19 DIAGNOSIS — R17 Unspecified jaundice: Secondary | ICD-10-CM

## 2020-03-19 DIAGNOSIS — R7989 Other specified abnormal findings of blood chemistry: Secondary | ICD-10-CM

## 2020-03-19 LAB — COMPREHENSIVE METABOLIC PANEL
ALT: 18 U/L (ref 0–44)
AST: 24 U/L (ref 15–41)
Albumin: 4.4 g/dL (ref 3.5–5.0)
Alkaline Phosphatase: 38 U/L (ref 38–126)
Anion gap: 7 (ref 5–15)
BUN: 21 mg/dL (ref 8–23)
CO2: 28 mmol/L (ref 22–32)
Calcium: 9.5 mg/dL (ref 8.9–10.3)
Chloride: 100 mmol/L (ref 98–111)
Creatinine, Ser: 1.24 mg/dL (ref 0.61–1.24)
GFR, Estimated: >60 mL/min (ref >60)
Glucose, Bld: 126 mg/dL — ABNORMAL HIGH (ref 70–99)
Potassium: 5.1 mmol/L (ref 3.5–5.1)
Sodium: 135 mmol/L (ref 135–145)
Total Bilirubin: 1.6 mg/dL — ABNORMAL HIGH (ref 0.3–1.2)
Total Protein: 7.1 g/dL (ref 6.5–8.1)

## 2020-03-19 LAB — CBC WITH DIFFERENTIAL/PLATELET
Abs Immature Granulocytes: 0.02 10*3/uL (ref 0.00–0.07)
Basophils Absolute: 0.1 10*3/uL (ref 0.0–0.1)
Basophils Relative: 1 %
Eosinophils Absolute: 0.1 10*3/uL (ref 0.0–0.5)
Eosinophils Relative: 1 %
HCT: 28.1 % — ABNORMAL LOW (ref 39.0–52.0)
Hemoglobin: 9.4 g/dL — ABNORMAL LOW (ref 13.0–17.0)
Immature Granulocytes: 0 %
Lymphocytes Relative: 24 %
Lymphs Abs: 1.4 10*3/uL (ref 0.7–4.0)
MCH: 34.9 pg — ABNORMAL HIGH (ref 26.0–34.0)
MCHC: 33.5 g/dL (ref 30.0–36.0)
MCV: 104.5 fL — ABNORMAL HIGH (ref 80.0–100.0)
Monocytes Absolute: 0.5 10*3/uL (ref 0.1–1.0)
Monocytes Relative: 9 %
Neutro Abs: 3.8 10*3/uL (ref 1.7–7.7)
Neutrophils Relative %: 65 %
Platelets: 329 10*3/uL (ref 150–400)
RBC: 2.69 MIL/uL — ABNORMAL LOW (ref 4.22–5.81)
RDW: 18.2 % — ABNORMAL HIGH (ref 11.5–15.5)
WBC: 5.8 10*3/uL (ref 4.0–10.5)
nRBC: 0.5 % — ABNORMAL HIGH (ref 0.0–0.2)

## 2020-03-19 LAB — IRON AND TIBC
Iron: 142 ug/dL (ref 45–182)
Saturation Ratios: 58 % — ABNORMAL HIGH (ref 17.9–39.5)
TIBC: 245 ug/dL — ABNORMAL LOW (ref 250–450)
UIBC: 103 ug/dL

## 2020-03-19 LAB — BILIRUBIN, DIRECT: Bilirubin, Direct: 0.1 mg/dL (ref 0.0–0.2)

## 2020-03-19 LAB — FERRITIN: Ferritin: 850 ng/mL — ABNORMAL HIGH (ref 24–336)

## 2020-03-19 MED ORDER — EPOETIN ALFA-EPBX 10000 UNIT/ML IJ SOLN
20000.0000 [IU] | Freq: Once | INTRAMUSCULAR | Status: AC
  Administered 2020-03-19: 20000 [IU] via SUBCUTANEOUS
  Filled 2020-03-19: qty 2

## 2020-04-01 DIAGNOSIS — R17 Unspecified jaundice: Secondary | ICD-10-CM | POA: Insufficient documentation

## 2020-04-02 ENCOUNTER — Other Ambulatory Visit: Payer: Self-pay

## 2020-04-02 ENCOUNTER — Inpatient Hospital Stay: Payer: Medicare Other

## 2020-04-02 ENCOUNTER — Inpatient Hospital Stay: Payer: Medicare Other | Attending: Hematology and Oncology

## 2020-04-02 ENCOUNTER — Encounter: Payer: Self-pay | Admitting: Hematology and Oncology

## 2020-04-02 VITALS — BP 117/72 | HR 70 | Temp 96.2°F | Resp 18

## 2020-04-02 DIAGNOSIS — D461 Refractory anemia with ring sideroblasts: Secondary | ICD-10-CM | POA: Insufficient documentation

## 2020-04-02 LAB — CBC
HCT: 26.5 % — ABNORMAL LOW (ref 39.0–52.0)
Hemoglobin: 8.8 g/dL — ABNORMAL LOW (ref 13.0–17.0)
MCH: 34.8 pg — ABNORMAL HIGH (ref 26.0–34.0)
MCHC: 33.2 g/dL (ref 30.0–36.0)
MCV: 104.7 fL — ABNORMAL HIGH (ref 80.0–100.0)
Platelets: 298 10*3/uL (ref 150–400)
RBC: 2.53 MIL/uL — ABNORMAL LOW (ref 4.22–5.81)
RDW: 19 % — ABNORMAL HIGH (ref 11.5–15.5)
WBC: 4.1 10*3/uL (ref 4.0–10.5)
nRBC: 0.7 % — ABNORMAL HIGH (ref 0.0–0.2)

## 2020-04-02 MED ORDER — EPOETIN ALFA-EPBX 10000 UNIT/ML IJ SOLN
20000.0000 [IU] | Freq: Once | INTRAMUSCULAR | Status: AC
  Administered 2020-04-02: 20000 [IU] via SUBCUTANEOUS

## 2020-04-10 ENCOUNTER — Other Ambulatory Visit: Payer: Self-pay | Admitting: Interventional Radiology

## 2020-04-10 DIAGNOSIS — N2889 Other specified disorders of kidney and ureter: Secondary | ICD-10-CM

## 2020-04-12 ENCOUNTER — Ambulatory Visit
Admission: RE | Admit: 2020-04-12 | Discharge: 2020-04-12 | Disposition: A | Discharge status: Home / Self Care | Payer: Medicare Other | Source: Ambulatory Visit | Attending: Urology | Admitting: Urology

## 2020-04-12 ENCOUNTER — Other Ambulatory Visit: Payer: Self-pay

## 2020-04-12 DIAGNOSIS — N2889 Other specified disorders of kidney and ureter: Secondary | ICD-10-CM

## 2020-04-12 DIAGNOSIS — K449 Diaphragmatic hernia without obstruction or gangrene: Secondary | ICD-10-CM | POA: Diagnosis not present

## 2020-04-12 DIAGNOSIS — K862 Cyst of pancreas: Secondary | ICD-10-CM | POA: Diagnosis not present

## 2020-04-12 DIAGNOSIS — K7689 Other specified diseases of liver: Secondary | ICD-10-CM | POA: Diagnosis not present

## 2020-04-12 DIAGNOSIS — N281 Cyst of kidney, acquired: Secondary | ICD-10-CM | POA: Diagnosis not present

## 2020-04-12 IMAGING — MR MR ABDOMEN WO/W CM
15 series · 45 of 48 positions shown · IV contrast (10ml Gadavist)
Comparison: [DATE] MRI abdomen.

CLINICAL DATA: Follow-up suspicious left renal mass.

EXAM:
MRI ABDOMEN WITHOUT AND WITH CONTRAST
TECHNIQUE: Multiplanar multisequence MR imaging of the abdomen was performed
both before and after the administration of intravenous contrast.
CONTRAST:  10mL GADAVIST GADOBUTROL 1 MMOL/ML IV SOLN

[Series 3: T2 · coronal · 6.0mm · 1.25mm/px · 1 of 37 slices shown (1 of 2)]
[im 1/37]
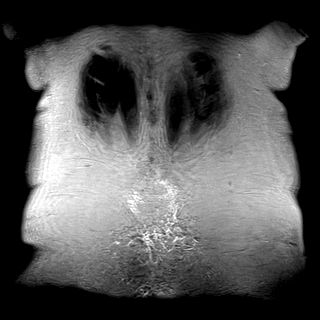

[Series 4: T2 · axial · 6.0mm · 1.25mm/px · z∈[-155,+83]mm · 2 of 34 slices shown (2 of 2)]
[im 1/34]
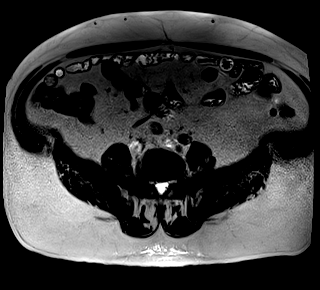
[im 34/34]
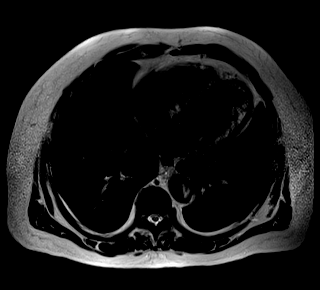

[Series 5: T1 · axial · 6.0mm · 0.78mm/px · z∈[-155,+83]mm · 2 of 34 slices shown (1 of 2)]
[im 1/34]
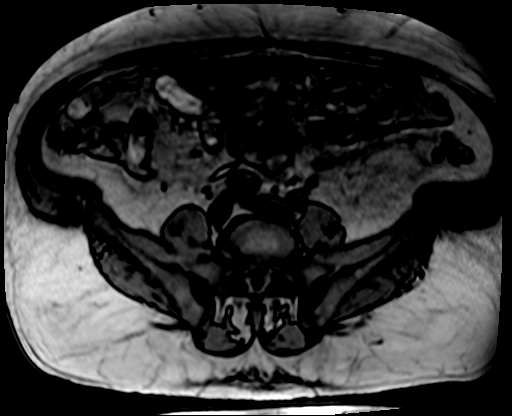
[im 34/34]
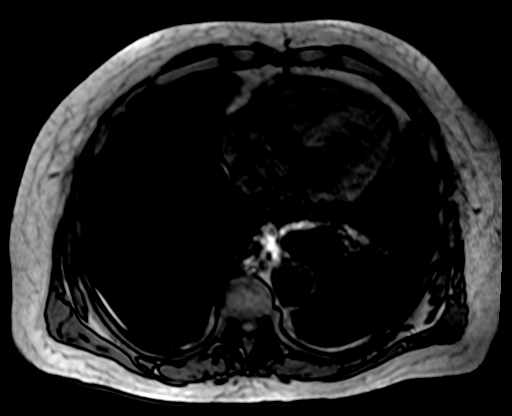

[Series 5: T1 · axial · 6.0mm · 0.78mm/px · z∈[-155,+83]mm · 2 of 34 slices shown (2 of 2)]
[im 1/34]
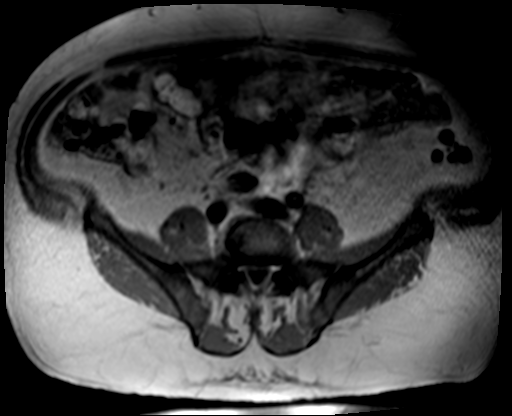
[im 34/34]
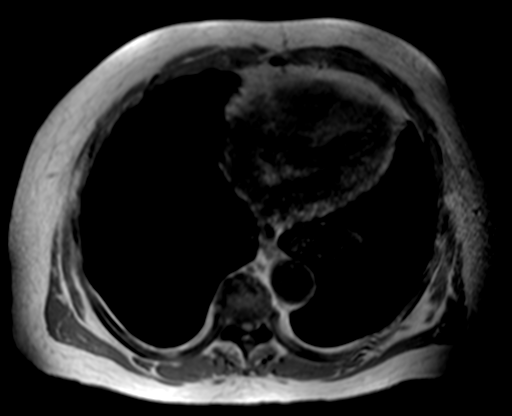

[Series 8: T2 fat-sat · axial · 6.0mm · 1.25mm/px · z∈[-142,+110]mm · 2 of 36 slices shown]
[im 1/36]
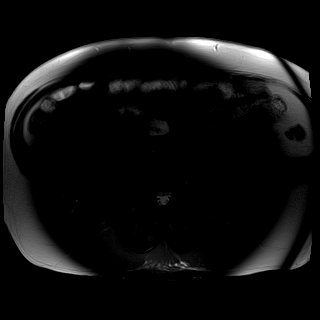
[im 36/36]
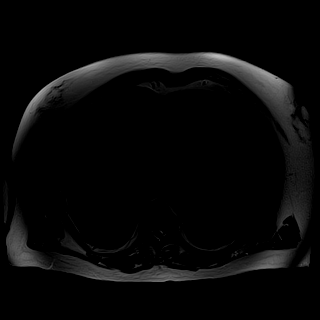

[Series 9: ax dwi_tracew · axial · 6.0mm · 1.42mm/px · z∈[-142,+110]mm · 5 of 108 slices shown]
[im 1/108]
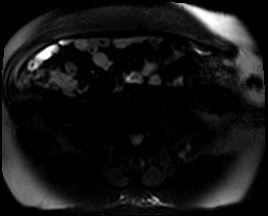
[im 27/108]
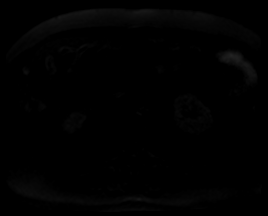
[im 54/108]
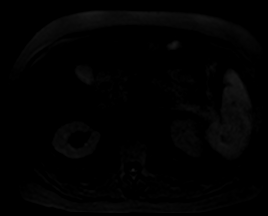
[im 81/108]
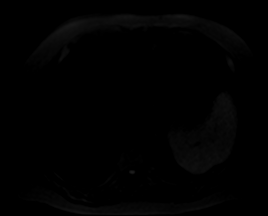
[im 108/108]
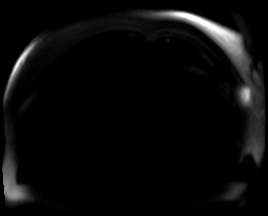

[Series 10: ax dwi_adc · axial · 6.0mm · 1.42mm/px · z∈[-142,+110]mm · 2 of 36 slices shown]
[im 1/36]
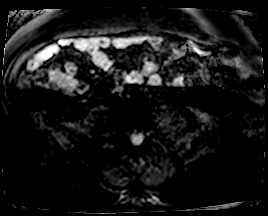
[im 36/36]
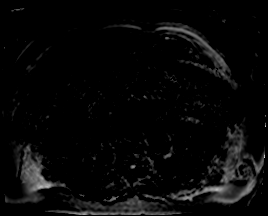

[Series 11: T1 dynamic fat-sat · axial · non-contrast · 3.0mm · 1.25mm/px · z∈[-167,+94]mm · 4 of 88 slices shown (1 of 3)]
[im 1/88]
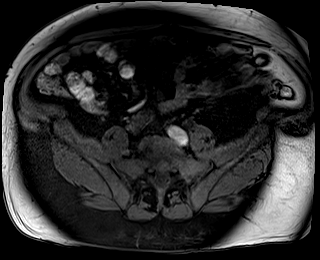
[im 30/88]
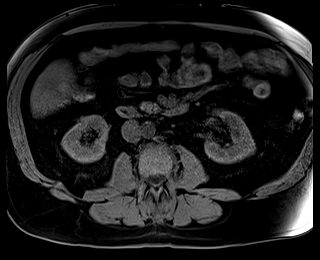
[im 59/88]
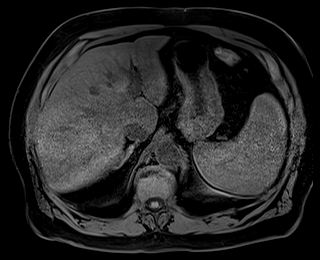
[im 88/88]
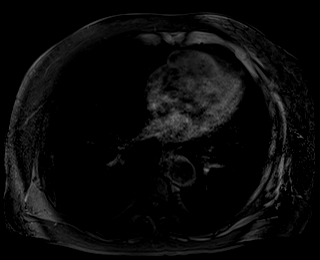

[Series 12: T1 dynamic fat-sat post-contrast · axial · 3.0mm · 1.25mm/px · z∈[-167,+94]mm · 4 of 88 slices shown (1 of 4)]
[im 1/88]
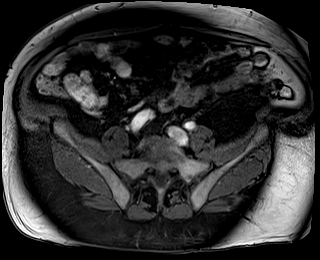
[im 30/88]
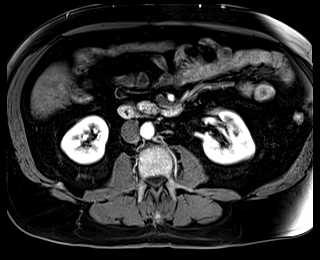
[im 59/88]
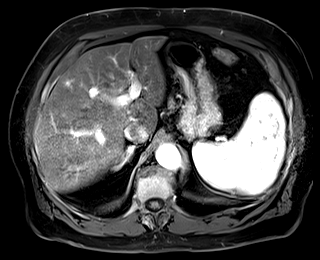
[im 88/88]
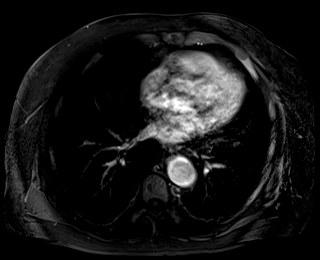

[Series 14: T1 dynamic fat-sat post-contrast · axial · 3.0mm · 1.25mm/px · z∈[-167,+94]mm · 4 of 88 slices shown (2 of 4)]
[im 1/88]
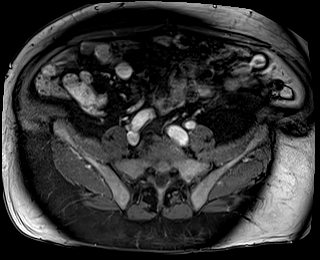
[im 30/88]
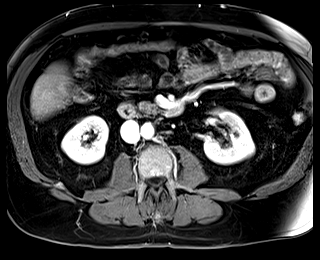
[im 59/88]
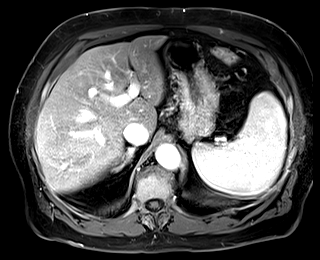
[im 88/88]
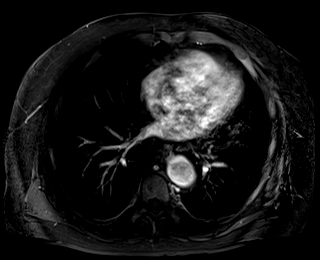

[Series 16: T1 dynamic fat-sat post-contrast · axial · 3.0mm · 1.25mm/px · z∈[-167,+94]mm · 4 of 88 slices shown (3 of 4)]
[im 1/88]
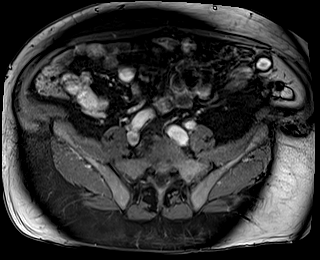
[im 30/88]
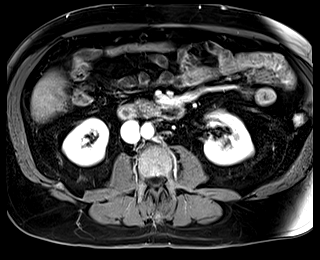
[im 59/88]
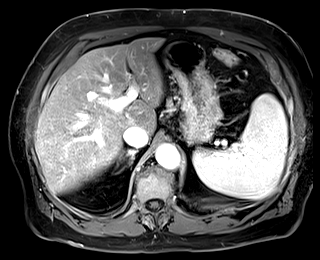
[im 88/88]
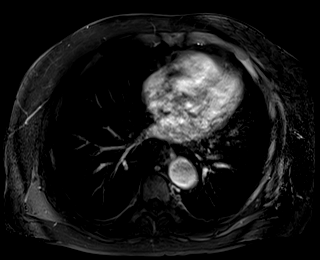

[Series 17: T1 dynamic fat-sat · axial · 3.0mm · 1.25mm/px · z∈[-167,+94]mm · 4 of 88 slices shown (2 of 3)]
[im 1/88]
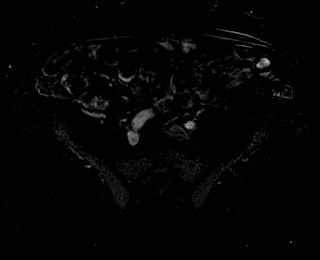
[im 30/88]
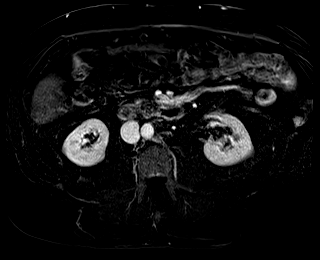
[im 59/88]
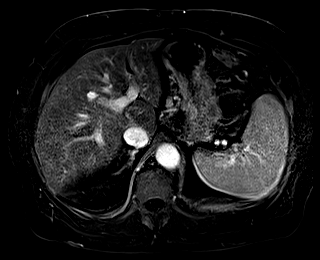
[im 88/88]
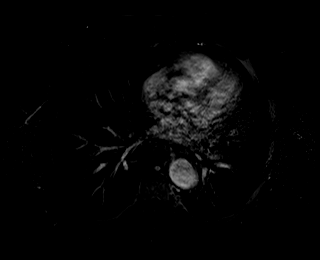

[Series 18: T1 dynamic post-contrast · coronal · 3.0mm · 1.38mm/px · 4 of 80 slices shown]
[im 1/80]
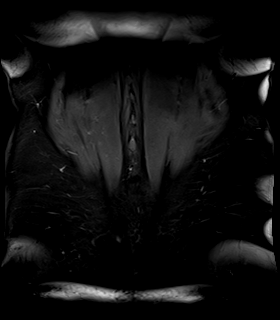
[im 27/80]
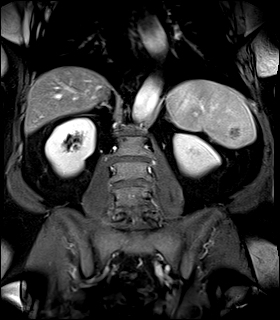
[im 53/80]
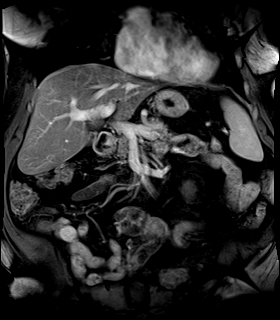
[im 80/80]
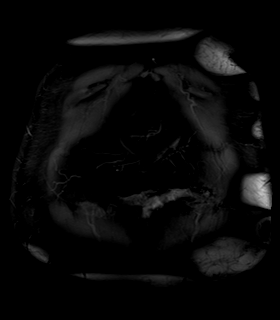

[Series 19: T1 dynamic fat-sat post-contrast · axial · 3.0mm · 1.25mm/px · z∈[-167,+94]mm · 4 of 88 slices shown (4 of 4)]
[im 1/88]
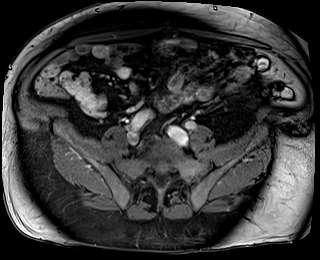
[im 30/88]
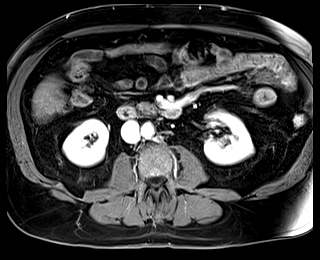
[im 59/88]
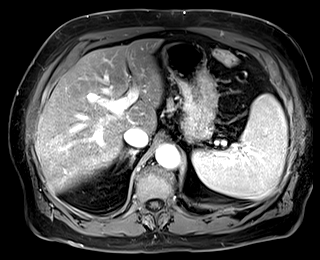
[im 88/88]
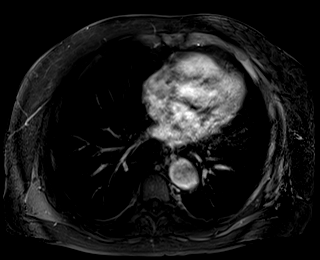

[Series 20: T1 dynamic fat-sat · axial · 3.0mm · 1.25mm/px · 1 of 88 slices shown (3 of 3)]
[im 1/88]
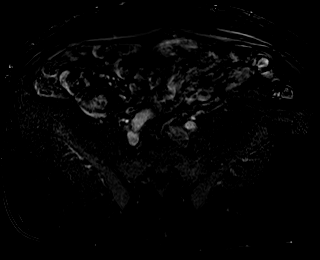

[45 of 48 positions shown; findings below may reference images not displayed]

FINDINGS: Lower chest: No acute abnormality at the lung bases.

Hepatobiliary: Normal liver size and configuration. Evidence of
diffuse hepatic hemosiderosis. No liver mass. Normal gallbladder
with no cholelithiasis. No biliary ductal dilatation. Common bile
duct diameter 4 mm. No choledocholithiasis. No biliary masses,
strictures or beading.

Pancreas: Tiny unilocular 0.5 cm pancreatic head cystic lesion
(series 8/image 20) without mild enhancement or wall thickening,
unchanged. No additional pancreatic lesions. No pancreatic duct
dilation. No pancreas divisum.

Spleen: Normal size spleen. Posterior splenic 2.5 x 2.1 cm mass with
heterogeneous T2 hyperintensity and progressive heterogeneous
enhancement, previously 2.5 x 2.1 cm, not appreciably changed,
compatible with a benign lesion favoring a hemangioma. No additional
splenic lesions.

Adrenals/Urinary Tract: Normal adrenals. No hydronephrosis.
Exophytic 1.4 x 1.4 x 1.3 cm renal cortical lesion in the lateral
lower left kidney (series 8/image 30) with T2 hypointensity T1
hyperintensity and no appreciable enhancement, compatible with
Bosniak category 2 hemorrhagic/proteinaceous renal cyst, stable
since [DATE] MRI. Anterior 1.7 x 1.6 x 1.6 cm renal cortical
lesion in mid to upper left kidney (series 8/image 22) with
heterogeneous mild T2 hyperintensity and heterogeneous avid
enhancement on the postcontrast subtraction sequences, previously
1.5 x 1.5 x 1.4 cm on [DATE] MRI using similar measurement
technique, slightly increased. Additional scattered subcentimeter
simple renal cortical lesions scattered in both kidneys.

Stomach/Bowel: Small hiatal hernia. Otherwise normal nondistended
stomach. Visualized small and large bowel is normal caliber, with no
bowel wall thickening. Mild left colonic diverticulosis.

Vascular/Lymphatic: Normal caliber abdominal aorta. Patent portal,
splenic, hepatic and renal veins. No evidence of renal vein tumor
thrombus. No pathologically enlarged lymph nodes in the abdomen.

Other: No abdominal ascites or focal fluid collection.

Musculoskeletal: No aggressive appearing focal osseous lesions.
IMPRESSION: 1. Slight interval increase in size of enhancing 1.7 cm renal
cortical lesion in the mid to upper left kidney, suspicious for
renal cell carcinoma. No evidence of renal vein tumor thrombus. No
evidence of metastatic disease in the abdomen.
2. Separate exophytic 1.4 cm Bosniak category 2
hemorrhagic/proteinaceous renal cyst in the lateral lower left
kidney, stable.
3. Stable solitary splenic lesion with progressive enhancement,
compatible with a benign lesion favoring a splenic hemangioma.
4. Tiny 0.5 cm pancreatic head cystic lesion without high risk MRI
features, stable. No pancreatic or biliary ductal dilatation.
Follow-up MRI abdomen without and with IV contrast recommended in 2
years. This recommendation follows ACR consensus guidelines:
Management of Incidental Pancreatic Cysts: A White Paper of the ACR
Incidental Findings Committee. [HOSPITAL] [58];[DATE].
5. Small hiatal hernia.
6. Diffuse hepatic hemosiderosis.

## 2020-04-12 MED ORDER — GADOBUTROL 1 MMOL/ML IV SOLN
10.0000 mL | Freq: Once | INTRAVENOUS | Status: AC | PRN
  Administered 2020-04-12: 10 mL via INTRAVENOUS

## 2020-04-16 ENCOUNTER — Other Ambulatory Visit: Payer: Self-pay

## 2020-04-16 ENCOUNTER — Encounter: Payer: Self-pay | Admitting: Hematology and Oncology

## 2020-04-16 ENCOUNTER — Inpatient Hospital Stay: Payer: Medicare Other

## 2020-04-16 VITALS — BP 114/70

## 2020-04-16 DIAGNOSIS — D461 Refractory anemia with ring sideroblasts: Secondary | ICD-10-CM

## 2020-04-16 LAB — CBC
HCT: 26.9 % — ABNORMAL LOW (ref 39.0–52.0)
Hemoglobin: 8.8 g/dL — ABNORMAL LOW (ref 13.0–17.0)
MCH: 34.6 pg — ABNORMAL HIGH (ref 26.0–34.0)
MCHC: 32.7 g/dL (ref 30.0–36.0)
MCV: 105.9 fL — ABNORMAL HIGH (ref 80.0–100.0)
Platelets: 322 10*3/uL (ref 150–400)
RBC: 2.54 MIL/uL — ABNORMAL LOW (ref 4.22–5.81)
RDW: 19.3 % — ABNORMAL HIGH (ref 11.5–15.5)
WBC: 4.9 10*3/uL (ref 4.0–10.5)
nRBC: 0.4 % — ABNORMAL HIGH (ref 0.0–0.2)

## 2020-04-16 MED ORDER — EPOETIN ALFA-EPBX 10000 UNIT/ML IJ SOLN
20000.0000 [IU] | Freq: Once | INTRAMUSCULAR | Status: AC
  Administered 2020-04-16: 20000 [IU] via SUBCUTANEOUS
  Filled 2020-04-16: qty 2

## 2020-04-18 ENCOUNTER — Telehealth: Payer: Self-pay

## 2020-04-18 NOTE — Telephone Encounter (Unsigned)
Copied from [facility] [id number]. Topic: Appointment Scheduling - Scheduling Inquiry for Clinic >> Apr 18, 2020 10:58 AM Loma [place] wrote: Reason for CRM:Pt wants a call back for sch with Dr Ronnald Ramp. Pt has had a stopped up left nostril for 2 wks, nothing over the counter will touch it. Fails DT, assured pt that sch request forwarded to Dr Ronnald Ramp and office will call back for resch. States to call at [phone] or [phone]

## 2020-04-18 NOTE — Telephone Encounter (Signed)
Pt calling in stating that he will continue to do what has been advised and that he will call back next week if it has not gotten any better. Please advise.

## 2020-04-18 NOTE — Telephone Encounter (Signed)
Called and left message to STOP Afrin if using that. Start using flonase and sudafed- offer appt for tomorrow if he has been taking these otc meds and they aren't working

## 2020-04-19 ENCOUNTER — Telehealth: Payer: Self-pay | Admitting: Hematology and Oncology

## 2020-04-19 NOTE — Telephone Encounter (Signed)
Re:  Review of labs and MRI  I tried to contact the patient on the phone.  A voice mail message was left.   Lequita Asal, MD

## 2020-04-25 ENCOUNTER — Ambulatory Visit
Admission: RE | Admit: 2020-04-25 | Discharge: 2020-04-25 | Disposition: A | Discharge status: Home / Self Care | Payer: Medicare Other | Source: Ambulatory Visit | Attending: Interventional Radiology | Admitting: Interventional Radiology

## 2020-04-25 ENCOUNTER — Encounter: Payer: Self-pay | Admitting: *Deleted

## 2020-04-25 ENCOUNTER — Other Ambulatory Visit: Payer: Self-pay

## 2020-04-25 DIAGNOSIS — N2889 Other specified disorders of kidney and ureter: Secondary | ICD-10-CM

## 2020-04-25 HISTORY — PX: IR RADIOLOGIST EVAL & MGMT: IMG5224

## 2020-04-25 NOTE — Progress Notes (Signed)
Chief Complaint: Patient was consulted remotely today (TeleHealth) for a left renal mass at the request of Karlin Heilman.    History of Present Illness: Brett Boone is a 75 y.o. male seen previously in consultation on 11/07/2019 for a small left renal mass after referral from Dr. Diamantina Providence.  At that time, given the small size, location and appearance by MRI of the lesion it was decided to perform a follow-up MRI in 6 months to follow growth and appearance of the lesion.  Brett Boone continues to be asymptomatic with respect to the renal mass.  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  Past Medical History:  Diagnosis Date  . Anemia   . Heart murmur   . Hydrocele   . Venous stasis dermatitis of both lower extremities     Past Surgical History:  Procedure Laterality Date  . CATARACT EXTRACTION W/PHACO Right 03/09/2017   Procedure: CATARACT EXTRACTION PHACO AND INTRAOCULAR LENS PLACEMENT ([place]) RIGHT;  Surgeon: Eulogio Bear, MD;  Location: [facility];  Service: Ophthalmology;  Laterality: Right;  . COLONOSCOPY  2013   normal- cleared for 5 yrs- Dr Beckey Downing  . COLONOSCOPY WITH PROPOFOL N/A 02/17/2017   Procedure: COLONOSCOPY WITH PROPOFOL;  Surgeon: Lin Landsman, MD;  Location: [facility];  Service: Endoscopy;  Laterality: N/A;  . COLONOSCOPY WITH PROPOFOL N/A 10/12/2019   Procedure: COLONOSCOPY WITH BIOPSY;  Surgeon: Lin Landsman, MD;  Location: [facility];  Service: Endoscopy;  Laterality: N/A;  . EPIGASTRIC HERNIA REPAIR N/A 07/06/2014   Procedure: HERNIA REPAIR EPIGASTRIC ADULT;  Surgeon: Molly Maduro, MD;  Location: [facility];  Service: General;  Laterality: N/A;  . HERNIA REPAIR Bilateral   . INSERTION OF MESH N/A 07/06/2014   Procedure: INSERTION OF MESH;  Surgeon: Molly Maduro, MD;  Location: [facility];  Service: General;  Laterality: N/A;   . IR RADIOLOGIST EVAL & MGMT  11/07/2019  . POLYPECTOMY  02/17/2017   Procedure: POLYPECTOMY;  Surgeon: Lin Landsman, MD;  Location: [facility];  Service: Endoscopy;;  . POLYPECTOMY N/A 10/12/2019   Procedure: POLYPECTOMY;  Surgeon: Lin Landsman, MD;  Location: [facility];  Service: Endoscopy;  Laterality: N/A;  . SHOULDER ARTHROSCOPY Left 2008  . SINUS EXPLORATION      Allergies: Amoxicillin-pot clavulanate  Medications: Prior to Admission medications   Medication Sig Start Date End Date Taking? Authorizing Provider  Calcium Carbonate-Vit D-Min (CALCIUM 1200 PO) Take by mouth.    [provider]  tadalafil (CIALIS) 5 MG tablet Take 5 mg by mouth daily as needed for erectile dysfunction.  06/27/19   Billey Co, MD     Family History  Problem Relation Age of Onset  . Diabetes Father   . Heart disease Father 76  . Stroke Mother     Social History   Socioeconomic History  . Marital status: Married    Spouse name: Not on file  . Number of children: 3  . Years of education: Not on file  . Highest education level: Bachelor's degree (e.g., BA, AB, BS)  Occupational History  . Occupation: Retired  Tobacco Use  . Smoking status: Former Smoker    Packs/day: 0.25    Years: 50.00    Pack years: 12.50    Types: Cigarettes    Quit date: 05/03/2018    Years since quitting: 1.9  . Smokeless tobacco: Former Systems developer  .  Tobacco comment: pt quit smoking  Vaping Use  . Vaping Use: Never used  Substance and Sexual Activity  . Alcohol use: Yes    Comment: Occasional -1x/month  . Drug use: No  . Sexual activity: Yes  Other Topics Concern  . Not on file  Social History Narrative  . Not on file   Social Determinants of Health   Financial Resource Strain: Low Risk   . Difficulty of Paying Living Expenses: Not hard at all  Food Insecurity: No Food Insecurity  . Worried About Charity fundraiser in the Last Year: Never true  . Ran Out of Food  in the Last Year: Never true  Transportation Needs: No Transportation Needs  . Lack of Transportation (Medical): No  . Lack of Transportation (Non-Medical): No  Physical Activity: Inactive  . Days of Exercise per Week: 0 days  . Minutes of Exercise per Session: 0 min  Stress: No Stress Concern Present  . Feeling of Stress : Only a little  Social Connections: Moderately Integrated  . Frequency of Communication with Friends and Family: More than three times a week  . Frequency of Social Gatherings with Friends and Family: Once a week  . Attends Religious Services: More than 4 times per year  . Active Member of Clubs or Organizations: No  . Attends Archivist Meetings: Never  . Marital Status: Married    ECOG Status: 0 - Asymptomatic  Review of Systems  Constitutional: Negative.   Respiratory: Negative.   Cardiovascular: Negative.   Gastrointestinal: Negative.   Genitourinary: Negative.   Musculoskeletal: Negative.   Neurological: Negative.     Review of Systems: A 12 point ROS discussed and pertinent positives are indicated in the HPI above.  All other systems are negative.  Physical Exam No direct physical exam was performed (except for noted visual exam findings with Video Visits).   Vital Signs: There were no vitals taken for this visit.  Imaging: MR Abdomen W Wo Contrast  Result Date: 04/14/2020 CLINICAL DATA:  Follow-up suspicious left renal mass. EXAM: MRI ABDOMEN WITHOUT AND WITH CONTRAST TECHNIQUE: Multiplanar multisequence MR imaging of the abdomen was performed both before and after the administration of intravenous contrast. CONTRAST:  24mL GADAVIST GADOBUTROL 1 MMOL/ML IV SOLN COMPARISON:  10/16/2019 MRI abdomen. FINDINGS: Lower chest: No acute abnormality at the lung bases. Hepatobiliary: Normal liver size and configuration. Evidence of diffuse hepatic hemosiderosis. No liver mass. Normal gallbladder with no cholelithiasis. No biliary ductal dilatation.  Common bile duct diameter 4 mm. No choledocholithiasis. No biliary masses, strictures or beading. Pancreas: Tiny unilocular 0.5 cm pancreatic head cystic lesion (series 8/image 20) without mild enhancement or wall thickening, unchanged. No additional pancreatic lesions. No pancreatic duct dilation. No pancreas divisum. Spleen: Normal size spleen. 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 No additional splenic lesions. Adrenals/Urinary Tract: Normal adrenals. No hydronephrosis. Exophytic 1.4 x 1.4 x 1.3 cm renal cortical lesion in the lateral lower left kidney (series 8/image 30) with T2 hypointensity T1 hyperintensity and no appreciable enhancement, compatible with Bosniak category 2 hemorrhagic/proteinaceous renal cyst, stable since 10/16/2018 MRI. Anterior 1.7 x 1.6 x 1.6 cm renal cortical lesion in mid to upper left kidney (series 8/image 22) with heterogeneous mild T2 hyperintensity and heterogeneous avid enhancement on the postcontrast subtraction sequences, previously 1.5 x 1.5 x 1.4 cm on 10/16/2019 MRI  using similar measurement technique, slightly increased. Additional scattered subcentimeter simple renal cortical lesions scattered in both kidneys. Stomach/Bowel: Small hiatal hernia. Otherwise normal nondistended stomach. Visualized small and large bowel is normal caliber, with no bowel wall thickening. Mild left colonic diverticulosis. Vascular/Lymphatic: Normal caliber abdominal aorta. Patent portal, splenic, hepatic and renal veins. No evidence of renal vein tumor thrombus. No pathologically enlarged lymph nodes in the abdomen. Other: No abdominal ascites or focal fluid collection. Musculoskeletal: No aggressive appearing focal osseous lesions. IMPRESSION: 1. Slight interval increase in size of enhancing 1.7 cm renal cortical  lesion in the mid to upper left kidney, suspicious for renal cell carcinoma. No evidence of renal vein tumor thrombus. No evidence of metastatic disease in the abdomen. 2. Separate exophytic 1.4 cm Bosniak category 2 hemorrhagic/proteinaceous renal cyst in the lateral lower left kidney, stable. 3. 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 4. Tiny 0.5 cm pancreatic head cystic lesion without high risk MRI features, stable. No pancreatic or biliary ductal dilatation. Follow-up MRI abdomen without and with IV contrast recommended in 2 years. This recommendation follows ACR consensus guidelines: Management of Incidental Pancreatic Cysts: A White Paper of the ACR Incidental Findings Committee. J Am Coll Radiol 7591;63:846-659. 5. Small hiatal hernia. 6. Diffuse hepatic hemosiderosis. Electronically Signed   By: Ilona Sorrel M.D.   On: 04/14/2020 17:11    Labs:  CBC: Recent Labs    03/05/20 1319 03/19/20 1249 04/02/20 1051 04/16/20 1046  WBC 6.4 5.8 4.1 4.9  HGB 9.6* 9.4* 8.8* 8.8*  HCT 29.2* 28.1* 26.5* 26.9*  PLT 290 329 298 322    COAGS: No results for input(s): INR, APTT in the last 8760 hours.  BMP: Recent Labs    07/19/19 0948 12/12/19 1306 01/23/20 1321 03/19/20 1249  NA 141 137 138 135  K 4.7 4.5 4.6 5.1  CL 105 105 106 100  CO2 24 26 26 28   GLUCOSE 100* 98 98 126*  BUN 20 15 17 21   CALCIUM 9.1 8.6* 8.9 9.5  CREATININE 1.03 1.18 1.14 1.24  GFRNONAA 72 >60 >60 >60  GFRAA 84  --   --   --     LIVER FUNCTION TESTS: Recent Labs    07/19/19 0948 12/12/19 1306 01/23/20 1321 03/19/20 1249  BILITOT 1.0 1.2 1.4* 1.6*  AST 17 19 19 24   ALT 14 16 15 18   ALKPHOS 50 37* 34* 38  PROT 6.4 6.6 6.3* 7.1  ALBUMIN 4.1 4.0 3.9 4.4     Assessment and Plan:  Follow-up MRI of the abdomen was performed on 04/12/2020.  The left renal mass continues to be quite subtle by imaging with rounded area of  endophytic enhancement present in the anterior left mid kidney.  By report, this was noted to have slightly increased in maximal diameter from approximately 1.5 cm to 1.7 cm.  This area is difficult to accurately measure due to subtle margination next to adjacent enhancing cortex.  By my measurements, the lesion may have grown slightly from approximately 1.4-1.5 cm up to 1.5-1.6 cm.  I spoke with Brett Boone and his wife over the phone.  This lesion remains challenging to address by percutaneous treatment due to its endophytic nature, subtle appearance after contrast administration and anterior location.  This may also be difficult to visualize by ultrasound given its appearance by MRI.  Given evidence of minimal growth over 6 months, I recommended that we continue active surveillance with another  MRI in 6 months and consider percutaneous cryoablation in the future if we demonstrate further growth by imaging.  Brett Boone and his wife also had questions regarding a potential small 5 mm cyst in the head of the pancreas visualized on the current MRI but not mentioned on the prior MRI report.  On my own personal review, this appears to be a tiny cystic area on the current MRI and was not definitely present on the prior MRI.  This could represent a tiny postinflammatory cyst.  I reassured them that this will be followed on future MRI studies and is currently so small that no intervention would be considered.   Electronically Signed: Azzie Roup 04/25/2020, 11:03 AM     I spent a total of 15 Minutes in remote  clinical consultation, greater than 50% of which was counseling/coordinating care for a left renal mass.    Visit type: Audio only (telephone). Audio (no video) only due to patient's lack of internet/smartphone capability. Alternative for in-person consultation at [facility], [address] [place], [place], [place]. This visit type was conducted due to national recommendations for  restrictions regarding the COVID-19 Pandemic (e.g. social distancing).  This format is felt to be most appropriate for this patient at this time.  All issues noted in this document were discussed and addressed.

## 2020-04-26 ENCOUNTER — Other Ambulatory Visit: Payer: Self-pay

## 2020-04-26 DIAGNOSIS — D461 Refractory anemia with ring sideroblasts: Secondary | ICD-10-CM

## 2020-04-30 ENCOUNTER — Other Ambulatory Visit: Payer: Self-pay

## 2020-04-30 ENCOUNTER — Inpatient Hospital Stay: Payer: Medicare Other

## 2020-04-30 ENCOUNTER — Telehealth: Payer: Self-pay

## 2020-04-30 ENCOUNTER — Inpatient Hospital Stay: Payer: Medicare Other | Attending: Hematology and Oncology

## 2020-04-30 ENCOUNTER — Ambulatory Visit: Payer: Medicare Other | Admitting: Urology

## 2020-04-30 ENCOUNTER — Encounter: Payer: Self-pay | Admitting: Urology

## 2020-04-30 VITALS — BP 115/67 | HR 73 | Ht 70.0 in | Wt 238.0 lb

## 2020-04-30 VITALS — BP 130/79 | HR 58 | Temp 96.5°F | Resp 20

## 2020-04-30 DIAGNOSIS — D461 Refractory anemia with ring sideroblasts: Secondary | ICD-10-CM | POA: Insufficient documentation

## 2020-04-30 DIAGNOSIS — N529 Male erectile dysfunction, unspecified: Secondary | ICD-10-CM | POA: Diagnosis not present

## 2020-04-30 DIAGNOSIS — N432 Other hydrocele: Secondary | ICD-10-CM | POA: Diagnosis not present

## 2020-04-30 DIAGNOSIS — N2889 Other specified disorders of kidney and ureter: Secondary | ICD-10-CM

## 2020-04-30 LAB — CBC WITH DIFFERENTIAL/PLATELET
Abs Immature Granulocytes: 0.02 10*3/uL (ref 0.00–0.07)
Basophils Absolute: 0.1 10*3/uL (ref 0.0–0.1)
Basophils Relative: 1 %
Eosinophils Absolute: 0.1 10*3/uL (ref 0.0–0.5)
Eosinophils Relative: 1 %
HCT: 27.5 % — ABNORMAL LOW (ref 39.0–52.0)
Hemoglobin: 9 g/dL — ABNORMAL LOW (ref 13.0–17.0)
Immature Granulocytes: 0 %
Lymphocytes Relative: 23 %
Lymphs Abs: 1.3 10*3/uL (ref 0.7–4.0)
MCH: 34.7 pg — ABNORMAL HIGH (ref 26.0–34.0)
MCHC: 32.7 g/dL (ref 30.0–36.0)
MCV: 106.2 fL — ABNORMAL HIGH (ref 80.0–100.0)
Monocytes Absolute: 0.6 10*3/uL (ref 0.1–1.0)
Monocytes Relative: 10 %
Neutro Abs: 3.6 10*3/uL (ref 1.7–7.7)
Neutrophils Relative %: 65 %
Platelets: 331 10*3/uL (ref 150–400)
RBC: 2.59 MIL/uL — ABNORMAL LOW (ref 4.22–5.81)
RDW: 19.3 % — ABNORMAL HIGH (ref 11.5–15.5)
WBC: 5.6 10*3/uL (ref 4.0–10.5)
nRBC: 0.4 % — ABNORMAL HIGH (ref 0.0–0.2)

## 2020-04-30 MED ORDER — EPOETIN ALFA-EPBX 10000 UNIT/ML IJ SOLN
20000.0000 [IU] | Freq: Once | INTRAMUSCULAR | Status: AC
  Administered 2020-04-30: 20000 [IU] via SUBCUTANEOUS

## 2020-04-30 MED ORDER — TADALAFIL 5 MG PO TABS: 5.0000 mg | ORAL_TABLET | Freq: Every day | ORAL | 11 refills | Status: DC

## 2020-04-30 NOTE — Progress Notes (Signed)
   04/30/2020 10:11 AM   Brett Boone 07-Jan-1946 [id number]  Reason for visit: Follow up right hydrocele, ED, left small renal mass  HPI: 75 year old male I have been following for the above issues.  Hydrocele is stable and asymptomatic, and he would like to continue observation.  ED is well managed on 5 mg Cialis daily, Cialis was refilled today.  He also has a ~1.5 cm endophytic left enhancing renal mass that has been under active surveillance.  He also is following with Dr. Kathlene Cote from interventional radiology, and he saw him on 04/25/2020.  I reviewed his note.  I personally viewed and interpreted the recent MRI that shows minimal to no change in the left renal mass that measures approximately 1.5 cm in size.  We again reviewed strategies including nephrectomy, partial nephrectomy, percutaneous ablation, or active surveillance.  We reviewed the extremely low risk of developing symptoms or metastatic disease while on active surveillance, even long-term.  He is amenable to continuing active surveillance which is very reasonable, and we will plan for 32-month follow-up with MRI.  He will continue to follow with IR as well.  Renal function is normal and stable as of February 2022 with creatinine of 1.24, EGFR greater than 60.  Cialis refilled for ED Continue observation of minimally symptomatic right hydrocele Continue active surveillance for small left renal mass, RTC for MRI in 6 months   Billey Co, MD  [facility] [address] [place], [place] [address] [phone]

## 2020-04-30 NOTE — Telephone Encounter (Signed)
Pt in clinic today and wanted some clarification regarding a VM that Dr. Mike Gip left him. Per Dr.Corcoran: He called about his low hemoglobin.  He may require more Retacrit.  Would he like to increase the frequency to every week?  Spoke to patient and he is willing to come weekly for retacrit, he states he will do whatever it takes to get his hemoglobin stable.   Patient will return to clinic for lab/ Retacit on 4/12 and 4/19. He will return for lab/MD/ retacrit on 5/3.

## 2020-05-07 ENCOUNTER — Inpatient Hospital Stay: Payer: Medicare Other

## 2020-05-07 ENCOUNTER — Other Ambulatory Visit: Payer: Self-pay | Admitting: Hematology and Oncology

## 2020-05-07 ENCOUNTER — Other Ambulatory Visit: Payer: Self-pay

## 2020-05-07 VITALS — BP 109/72 | HR 56 | Resp 20

## 2020-05-07 DIAGNOSIS — D461 Refractory anemia with ring sideroblasts: Secondary | ICD-10-CM

## 2020-05-07 LAB — HEMOGLOBIN AND HEMATOCRIT, BLOOD
HCT: 27.9 % — ABNORMAL LOW (ref 39.0–52.0)
Hemoglobin: 9.1 g/dL — ABNORMAL LOW (ref 13.0–17.0)

## 2020-05-07 MED ORDER — EPOETIN ALFA-EPBX 10000 UNIT/ML IJ SOLN
20000.0000 [IU] | Freq: Once | INTRAMUSCULAR | Status: AC
  Administered 2020-05-07: 20000 [IU] via SUBCUTANEOUS

## 2020-05-14 ENCOUNTER — Other Ambulatory Visit: Payer: Self-pay

## 2020-05-14 ENCOUNTER — Inpatient Hospital Stay: Payer: Medicare Other

## 2020-05-14 VITALS — BP 112/62 | HR 60 | Temp 96.7°F | Resp 20

## 2020-05-14 DIAGNOSIS — D461 Refractory anemia with ring sideroblasts: Secondary | ICD-10-CM | POA: Diagnosis not present

## 2020-05-14 LAB — CBC WITH DIFFERENTIAL/PLATELET
Abs Immature Granulocytes: 0.03 10*3/uL (ref 0.00–0.07)
Basophils Absolute: 0 10*3/uL (ref 0.0–0.1)
Basophils Relative: 1 %
Eosinophils Absolute: 0.1 10*3/uL (ref 0.0–0.5)
Eosinophils Relative: 2 %
HCT: 28.6 % — ABNORMAL LOW (ref 39.0–52.0)
Hemoglobin: 9.2 g/dL — ABNORMAL LOW (ref 13.0–17.0)
Immature Granulocytes: 1 %
Lymphocytes Relative: 20 %
Lymphs Abs: 1.1 10*3/uL (ref 0.7–4.0)
MCH: 34.7 pg — ABNORMAL HIGH (ref 26.0–34.0)
MCHC: 32.2 g/dL (ref 30.0–36.0)
MCV: 107.9 fL — ABNORMAL HIGH (ref 80.0–100.0)
Monocytes Absolute: 0.5 10*3/uL (ref 0.1–1.0)
Monocytes Relative: 9 %
Neutro Abs: 3.8 10*3/uL (ref 1.7–7.7)
Neutrophils Relative %: 67 %
Platelets: 307 10*3/uL (ref 150–400)
RBC: 2.65 MIL/uL — ABNORMAL LOW (ref 4.22–5.81)
RDW: 20.1 % — ABNORMAL HIGH (ref 11.5–15.5)
WBC: 5.6 10*3/uL (ref 4.0–10.5)
nRBC: 0.9 % — ABNORMAL HIGH (ref 0.0–0.2)

## 2020-05-14 MED ORDER — EPOETIN ALFA-EPBX 10000 UNIT/ML IJ SOLN
20000.0000 [IU] | Freq: Once | INTRAMUSCULAR | Status: AC
Start: 2020-05-14 — End: 2020-05-14
  Administered 2020-05-14: 20000 [IU] via SUBCUTANEOUS
  Filled 2020-05-14: qty 2

## 2020-05-14 NOTE — Progress Notes (Signed)
Patient tolerated Retacrit injection today, no concerns voiced. Patient discharged, stable.

## 2020-05-21 ENCOUNTER — Inpatient Hospital Stay: Payer: Medicare Other

## 2020-05-21 ENCOUNTER — Other Ambulatory Visit: Payer: Self-pay

## 2020-05-21 ENCOUNTER — Ambulatory Visit: Payer: Medicare Other

## 2020-05-21 ENCOUNTER — Other Ambulatory Visit: Payer: Medicare Other

## 2020-05-21 ENCOUNTER — Other Ambulatory Visit: Payer: Self-pay | Admitting: Oncology

## 2020-05-21 VITALS — BP 118/65 | HR 72 | Temp 97.0°F

## 2020-05-21 DIAGNOSIS — D461 Refractory anemia with ring sideroblasts: Secondary | ICD-10-CM

## 2020-05-21 LAB — CBC WITH DIFFERENTIAL/PLATELET
Abs Immature Granulocytes: 0.04 10*3/uL (ref 0.00–0.07)
Basophils Absolute: 0.1 10*3/uL (ref 0.0–0.1)
Basophils Relative: 1 %
Eosinophils Absolute: 0.1 10*3/uL (ref 0.0–0.5)
Eosinophils Relative: 2 %
HCT: 28.1 % — ABNORMAL LOW (ref 39.0–52.0)
Hemoglobin: 9.2 g/dL — ABNORMAL LOW (ref 13.0–17.0)
Immature Granulocytes: 1 %
Lymphocytes Relative: 22 %
Lymphs Abs: 1.3 10*3/uL (ref 0.7–4.0)
MCH: 34.8 pg — ABNORMAL HIGH (ref 26.0–34.0)
MCHC: 32.7 g/dL (ref 30.0–36.0)
MCV: 106.4 fL — ABNORMAL HIGH (ref 80.0–100.0)
Monocytes Absolute: 0.6 10*3/uL (ref 0.1–1.0)
Monocytes Relative: 10 %
Neutro Abs: 3.7 10*3/uL (ref 1.7–7.7)
Neutrophils Relative %: 64 %
Platelets: 299 10*3/uL (ref 150–400)
RBC: 2.64 MIL/uL — ABNORMAL LOW (ref 4.22–5.81)
RDW: 20.2 % — ABNORMAL HIGH (ref 11.5–15.5)
WBC: 5.8 10*3/uL (ref 4.0–10.5)
nRBC: 0.7 % — ABNORMAL HIGH (ref 0.0–0.2)

## 2020-05-21 MED ORDER — EPOETIN ALFA-EPBX 10000 UNIT/ML IJ SOLN
20000.0000 [IU] | Freq: Once | INTRAMUSCULAR | Status: AC
Start: 2020-05-21 — End: 2020-05-21
  Administered 2020-05-21: 20000 [IU] via SUBCUTANEOUS
  Filled 2020-05-21: qty 2

## 2020-05-24 ENCOUNTER — Other Ambulatory Visit: Payer: Self-pay

## 2020-05-24 DIAGNOSIS — N289 Disorder of kidney and ureter, unspecified: Secondary | ICD-10-CM

## 2020-05-24 DIAGNOSIS — D461 Refractory anemia with ring sideroblasts: Secondary | ICD-10-CM

## 2020-05-24 DIAGNOSIS — R7989 Other specified abnormal findings of blood chemistry: Secondary | ICD-10-CM

## 2020-05-28 ENCOUNTER — Other Ambulatory Visit: Payer: Medicare Other

## 2020-05-28 ENCOUNTER — Other Ambulatory Visit: Payer: Self-pay

## 2020-05-28 ENCOUNTER — Inpatient Hospital Stay: Payer: Medicare Other

## 2020-05-28 ENCOUNTER — Ambulatory Visit: Payer: Medicare Other

## 2020-05-28 ENCOUNTER — Inpatient Hospital Stay (HOSPITAL_BASED_OUTPATIENT_CLINIC_OR_DEPARTMENT_OTHER): Payer: Medicare Other | Admitting: Nurse Practitioner

## 2020-05-28 ENCOUNTER — Encounter: Payer: Self-pay | Admitting: Nurse Practitioner

## 2020-05-28 ENCOUNTER — Inpatient Hospital Stay: Payer: Medicare Other | Attending: Nurse Practitioner

## 2020-05-28 ENCOUNTER — Inpatient Hospital Stay: Payer: Medicare Other | Admitting: Hematology and Oncology

## 2020-05-28 VITALS — BP 115/68 | HR 54 | Temp 96.7°F | Resp 20 | Wt 239.5 lb

## 2020-05-28 DIAGNOSIS — D461 Refractory anemia with ring sideroblasts: Secondary | ICD-10-CM | POA: Diagnosis not present

## 2020-05-28 DIAGNOSIS — R7989 Other specified abnormal findings of blood chemistry: Secondary | ICD-10-CM

## 2020-05-28 DIAGNOSIS — Z148 Genetic carrier of other disease: Secondary | ICD-10-CM

## 2020-05-28 DIAGNOSIS — N289 Disorder of kidney and ureter, unspecified: Secondary | ICD-10-CM

## 2020-05-28 LAB — COMPREHENSIVE METABOLIC PANEL
ALT: 16 U/L (ref 0–44)
AST: 21 U/L (ref 15–41)
Albumin: 4.1 g/dL (ref 3.5–5.0)
Alkaline Phosphatase: 35 U/L — ABNORMAL LOW (ref 38–126)
Anion gap: 3 — ABNORMAL LOW (ref 5–15)
BUN: 25 mg/dL — ABNORMAL HIGH (ref 8–23)
CO2: 29 mmol/L (ref 22–32)
Calcium: 9 mg/dL (ref 8.9–10.3)
Chloride: 106 mmol/L (ref 98–111)
Creatinine, Ser: 1.01 mg/dL (ref 0.61–1.24)
GFR, Estimated: >60 mL/min (ref >60)
Glucose, Bld: 113 mg/dL — ABNORMAL HIGH (ref 70–99)
Potassium: 4.8 mmol/L (ref 3.5–5.1)
Sodium: 138 mmol/L (ref 135–145)
Total Bilirubin: 1.3 mg/dL — ABNORMAL HIGH (ref 0.3–1.2)
Total Protein: 6.4 g/dL — ABNORMAL LOW (ref 6.5–8.1)

## 2020-05-28 LAB — CBC WITH DIFFERENTIAL/PLATELET
Abs Immature Granulocytes: 0.04 10*3/uL (ref 0.00–0.07)
Basophils Absolute: 0 10*3/uL (ref 0.0–0.1)
Basophils Relative: 1 %
Eosinophils Absolute: 0.1 10*3/uL (ref 0.0–0.5)
Eosinophils Relative: 2 %
HCT: 27 % — ABNORMAL LOW (ref 39.0–52.0)
Hemoglobin: 8.8 g/dL — ABNORMAL LOW (ref 13.0–17.0)
Immature Granulocytes: 1 %
Lymphocytes Relative: 21 %
Lymphs Abs: 1.1 10*3/uL (ref 0.7–4.0)
MCH: 35.1 pg — ABNORMAL HIGH (ref 26.0–34.0)
MCHC: 32.6 g/dL (ref 30.0–36.0)
MCV: 107.6 fL — ABNORMAL HIGH (ref 80.0–100.0)
Monocytes Absolute: 0.5 10*3/uL (ref 0.1–1.0)
Monocytes Relative: 11 %
Neutro Abs: 3.3 10*3/uL (ref 1.7–7.7)
Neutrophils Relative %: 64 %
Platelets: 273 10*3/uL (ref 150–400)
RBC: 2.51 MIL/uL — ABNORMAL LOW (ref 4.22–5.81)
RDW: 20.9 % — ABNORMAL HIGH (ref 11.5–15.5)
WBC: 5 10*3/uL (ref 4.0–10.5)
nRBC: 0.6 % — ABNORMAL HIGH (ref 0.0–0.2)

## 2020-05-28 LAB — IRON AND TIBC
Iron: 143 ug/dL (ref 45–182)
Saturation Ratios: 63 % — ABNORMAL HIGH (ref 17.9–39.5)
TIBC: 228 ug/dL — ABNORMAL LOW (ref 250–450)
UIBC: 85 ug/dL

## 2020-05-28 LAB — FERRITIN: Ferritin: 569 ng/mL — ABNORMAL HIGH (ref 24–336)

## 2020-05-28 MED ORDER — EPOETIN ALFA-EPBX 10000 UNIT/ML IJ SOLN
20000.0000 [IU] | Freq: Once | INTRAMUSCULAR | Status: AC
Start: 2020-05-28 — End: 2020-05-28
  Administered 2020-05-28: 20000 [IU] via SUBCUTANEOUS

## 2020-05-28 NOTE — Progress Notes (Signed)
[facility]  [address], [address] [place], [place] [address] Phone: [phone]  Fax: [phone]  Clinic Day:  05/28/2020  Referring physician: [name], MD  Chief Complaint: MDS   Hematology/Oncology History:   Brett Boone is a 75 y.o. 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  He presented with a macrocytic anemia.  No hx of underlying liver disease or alcoholism.   Bone marrow biopsy on 09/11/2019 revealed macrocytic anemia and hypercellular bone marrow for age with dyspoietic changes primarily involving the erythroid cell lines associated with abundant ring sideroblasts (> 15%). There was no increase in blasts. Flow cytometry was negative. Cytogenetics revealed 45,X,-Y [20].  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 IPSS score is low.  CBC on 07/26/2019 revealed a hematocrit 28.5, hemoglobin 9.8, MCV 102, platelets 324,000, WBC 7200 with an ANC 4800.  Labs on 07/19/2019 included a ferritin of 1020, B12 of 1083 and folate 6.9.  Fecal occult blood was positive on 08/01/2019.   Work-up on 08/15/2019 revealed a  hematocrit of 29.0, hemoglobin 9.3, MCV 109.0, platelets 309,000, WBC 6,200. Ferritin was 641 with an iron saturation of 40% and a TIBC of 241. Reticulocyte count was 3.2%. TSH was 1.275 and free T4 was 0.85.  CRP was 0.6 and sed rate 24.  He patient received Aranesp on 10/17/2019 and 11/14/2019.  He switched to Retacrit on 12/12/2019 (last on 03/05/2020).  Retacrit was increased from 10,000 units to 20,000 units on 02/06/2020. He received Retacrit 20,000 units biweekly x 3 (02/06/2020 - 03/05/2020).  Ferritin has been followed: 1020 on 07/19/2019 and 641 on 08/15/2019.  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  Colonoscopy on 10/12/2019 revealed nine 3 to 7 mm polyps: 3 in the rectum (hyperplastic polyps), 5 in the ascending colon (sessile serrated  polyps and tubular adenomas), and 1 in the cecum (serrated polyp). There was diverticulosis in the sigmoid colon. There were non-bleeding external hemorrhoids..  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 There was no tumor thrombus in the left renal vein or adenopathy. There was mild splenomegaly (12.8 x 7.2 x 10.4 cm). There was a complex 2.3 cm splenic lesion with high T2 signal and low T1 signal probably had faint delayed enhancement, and was technically nonspecific. This lesion was probably present on 07/20/2018 although that noncontrast CT exam did not show this to good effect. 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   He has a smoking history.  Lung cancer screening chest CT on 08/01/2019 revealed Lung-RADS 3, probably benign findings. There was a new 4.5 mm basilar left lower lobe pulmonary nodule and a sable ectatic 4.1 cm ascending thoracic  He received the high-dose flu vaccine on 11/21/2019.  He received the [place] COVID-19 vaccine on 03/08/2019 and 03/29/2019. He received the [employer] on 01/10/2020.   HPI: Brett Boone is a 75 y.o. male with a myelodysplastic syndrome with ringed sideroblasts who returns to clinic for labs and re-evaluation. Today, patient says he feels well. He is active in his retirement. Denies fatigue, recent infections, abnormal bleeding or bruising. No nausea, vomiting, diarrhea, or constipation. No chest pain or shortness of breath. No urinary symptoms. No rashes or lesions.  Past Medical History:  Diagnosis Date  . Anemia   . Heart murmur   . Hydrocele   . Venous stasis dermatitis of both lower extremities     Past Surgical History:  Procedure Laterality Date  .  CATARACT EXTRACTION W/PHACO Right 03/09/2017   Procedure: CATARACT EXTRACTION PHACO AND INTRAOCULAR LENS PLACEMENT ([place]) RIGHT;  Surgeon: Eulogio Bear, MD;  Location: [place];  Service: Ophthalmology;  Laterality: Right;  . COLONOSCOPY  2013   normal- cleared for 5 yrs- Dr Beckey Downing  . COLONOSCOPY WITH PROPOFOL N/A 02/17/2017   Procedure: COLONOSCOPY WITH PROPOFOL;  Surgeon: Lin Landsman, MD;  Location: [place];  Service: Endoscopy;  Laterality: N/A;  . COLONOSCOPY WITH PROPOFOL N/A 10/12/2019   Procedure: COLONOSCOPY WITH BIOPSY;  Surgeon: Lin Landsman, MD;  Location: [place];  Service: Endoscopy;  Laterality: N/A;  . EPIGASTRIC HERNIA REPAIR N/A 07/06/2014   Procedure: HERNIA REPAIR EPIGASTRIC ADULT;  Surgeon: Molly Maduro, MD;  Location: [facility];  Service: General;  Laterality: N/A;  . HERNIA REPAIR Bilateral   . INSERTION OF MESH N/A 07/06/2014   Procedure: INSERTION OF MESH;  Surgeon: Molly Maduro, MD;  Location: [facility];  Service: General;  Laterality: N/A;  . IR RADIOLOGIST EVAL & MGMT  11/07/2019  . IR RADIOLOGIST EVAL & MGMT  04/25/2020  . POLYPECTOMY  02/17/2017   Procedure: POLYPECTOMY;  Surgeon: Lin Landsman, MD;  Location: [place];  Service: Endoscopy;;  . POLYPECTOMY N/A 10/12/2019   Procedure: POLYPECTOMY;  Surgeon: Lin Landsman, MD;  Location: [place];  Service: Endoscopy;  Laterality: N/A;  . SHOULDER ARTHROSCOPY Left 2008  . SINUS EXPLORATION      Family History  Problem Relation Age of Onset  . Diabetes Father   . Heart disease Father 48  . Stroke Mother    Social History:  reports that he quit smoking about 2 years ago. His smoking use included cigarettes. He has a 12.50 pack-year smoking history. He has quit using smokeless tobacco. He reports current alcohol use. He reports that he does not use drugs. 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 His wife is a  retired Marine scientist.   Allergies:  Allergies  Allergen Reactions  . Amoxicillin-Pot Clavulanate Hives and Nausea And Vomiting   Current Medications: Current Outpatient Medications  Medication Sig Dispense Refill  . tadalafil (CIALIS) 5 MG tablet Take 1 tablet (5 mg total) by mouth daily. 30 tablet 11  . Calcium Carbonate-Vit D-Min (CALCIUM 1200 PO) Take by mouth. (Patient not taking: Reported on 05/28/2020)     No current facility-administered medications for this visit.   Review of Systems  Constitutional: Positive for weight loss (intentional weight loss). Negative for chills, diaphoresis, fever and malaise/fatigue.  HENT: Negative for congestion, ear discharge, ear pain, hearing loss, nosebleeds, sinus pain, sore throat and tinnitus.   Eyes: Negative for blurred vision and double vision.  Respiratory: Negative for cough, hemoptysis, sputum production, shortness of breath and wheezing.   Cardiovascular: Negative for chest pain, palpitations and leg swelling.  Gastrointestinal: Negative for abdominal pain, blood in stool, constipation, diarrhea, heartburn, melena, nausea and vomiting.  Genitourinary: Negative for dysuria, frequency, hematuria and urgency.  Musculoskeletal: Negative for back pain, falls, joint pain, myalgias and neck pain.  Skin: Negative for itching and rash.  Neurological: Negative for dizziness, tingling, sensory change, loss of consciousness, weakness and headaches.  Endo/Heme/Allergies: Negative for environmental allergies. Does not bruise/bleed easily.  Psychiatric/Behavioral: Negative for depression and memory loss. The patient is not nervous/anxious and does not have insomnia.   All other systems reviewed and are negative.  Performance status (ECOG): 0  Vitals Blood pressure 115/68, pulse (!) 54, temperature (!) 96.7 F (35.9 C), resp. rate 20, weight 239 lb 8.5 oz (108.7 kg), SpO2 100 %.   Physical Exam Constitutional:      Appearance: He is not ill-appearing.      Comments: Accompanied by wife  HENT:     Head: Normocephalic and atraumatic.  Eyes:     General: No scleral icterus.    Conjunctiva/sclera: Conjunctivae normal.  Cardiovascular:     Rate and Rhythm: Normal rate and regular rhythm.  Pulmonary:     Effort: Pulmonary effort is normal. No respiratory distress.  Abdominal:     General: There is no distension.     Tenderness: There is no abdominal tenderness. There is no guarding.  Musculoskeletal:        General: No deformity or signs of injury.  Skin:    General: Skin is warm and dry.     Findings: No rash.     Comments: tan  Neurological:     General: No focal deficit present.     Mental Status: He is alert and oriented to person, place, and time.  Psychiatric:        Mood and Affect: Mood normal.        Behavior: Behavior normal.    Appointment on 05/28/2020  Component Date Value Ref Range Status  . WBC 05/28/2020 5.0  4.0 - 10.5 K/uL Final  . RBC 05/28/2020 2.51* 4.22 - 5.81 MIL/uL Final  . Hemoglobin 05/28/2020 8.8* 13.0 - 17.0 g/dL Final  . HCT 05/28/2020 27.0* 39.0 - 52.0 % Final  . MCV 05/28/2020 107.6* 80.0 - 100.0 fL Final  . MCH 05/28/2020 35.1* 26.0 - 34.0 pg Final  . MCHC 05/28/2020 32.6  30.0 - 36.0 g/dL Final  . RDW 05/28/2020 20.9* 11.5 - 15.5 % Final  . Platelets 05/28/2020 273  150 - 400 K/uL Final  . nRBC 05/28/2020 0.6* 0.0 - 0.2 % Final  . Neutrophils Relative % 05/28/2020 64  % Final  . Neutro Abs 05/28/2020 3.3  1.7 - 7.7 K/uL Final  . Lymphocytes Relative 05/28/2020 21  % Final  . Lymphs Abs 05/28/2020 1.1  0.7 - 4.0 K/uL Final  . Monocytes Relative 05/28/2020 11  % Final  . Monocytes Absolute 05/28/2020 0.5  0.1 - 1.0 K/uL Final  . Eosinophils Relative 05/28/2020 2  % Final  . Eosinophils Absolute 05/28/2020 0.1  0.0 - 0.5 K/uL Final  . Basophils Relative 05/28/2020 1  % Final  . Basophils Absolute 05/28/2020 0.0  0.0 - 0.1 K/uL Final  . Immature Granulocytes 05/28/2020 1  % Final  . Abs  Immature Granulocytes 05/28/2020 0.04  0.00 - 0.07 K/uL Final   Performed at [facility], [address], [place], [place] [address]  . Sodium 05/28/2020 138  135 - 145 mmol/L Final  . Potassium 05/28/2020 4.8  3.5 - 5.1 mmol/L Final  . Chloride 05/28/2020 106  98 - 111 mmol/L Final  . CO2 05/28/2020 29  22 - 32 mmol/L Final  . Glucose, Bld 05/28/2020 113* 70 - 99 mg/dL Final   Glucose reference range applies only to samples taken after fasting for at least 8 hours.  . BUN 05/28/2020 25* 8 - 23 mg/dL Final  . Creatinine, Ser 05/28/2020 1.01  0.61 - 1.24 mg/dL Final  . Calcium 05/28/2020 9.0  8.9 - 10.3 mg/dL Final  . Total Protein 05/28/2020 6.4* 6.5 - 8.1 g/dL Final  . Albumin 05/28/2020 4.1  3.5 - 5.0 g/dL Final  . AST 05/28/2020 21  15 - 41 U/L Final  . ALT 05/28/2020 16  0 - 44 U/L Final  . Alkaline Phosphatase 05/28/2020 35* 38 - 126 U/L Final  . Total Bilirubin 05/28/2020 1.3* 0.3 - 1.2 mg/dL Final  . GFR, Estimated 05/28/2020 >60  >60 mL/min Final   Comment: (NOTE) Calculated using the CKD-EPI Creatinine Equation (2021)   . Anion gap 05/28/2020 3* 5 - 15 Final   Performed at [facility], [address] [place], [place] [address]  . Iron 05/28/2020 143  45 - 182 ug/dL Final  . TIBC 05/28/2020 228* 250 - 450 ug/dL Final  . Saturation Ratios 05/28/2020 63* 17.9 - 39.5 % Final  . UIBC 05/28/2020 85  ug/dL Final   Performed at [facility], [address], [place], [place] [address]  . Ferritin 05/28/2020 569* 24 - 336 ng/mL Final   Performed at [facility], [address] [place], [place] [address]    Assessment:Patient is 75 year old male diagnosed with MDS with ringed sideroblasts who returns to clinic for labs, re-evaluation, and consideration of continuation of retacrit.   1. MDS with ringed sideroblasts- low grade MDS, low IPSS. EPO level was 63.3 mIU/ml on 09/16/19. Began aranesp 10/17/19. Switched to [place] on 12/12/19 due  to insurance. Received weekly x 6.  Reviewed that indications for therapy in MDS would be transfusion dependence or symptoms related to cytopenias. He has neither of those. Should he develop symptoms or require transfusions, would consider treatment. At this point, given that he has low risk MDS, no 5q- but moderate anemia with hemoglobin < 10, serum EPO level < 500, no transfusion history, recommend use of ESAs such as epogen, procrit, retacrit, or aranesp. If neutropenia develops, would consider adding myeloid growth factor such as granix or neupogen.  Continue retacrit 20,000 units weekly. If anemia worsens or if he becomes symptomatic, consider pushing dose to 40,000 units weekly. Reviewed with Dr. Grayland Ormond who agrees with plan of care.   2. 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 Typically patients do not manifest disease. Asymptomatic. Ferritin appears to trend up and down. Iron saturation elevated at 63. Previous sed rate and CRP were low. Monitor.    3. Left renal mass- incidentally found during workup for anemia. MRI revealed 1.3 x 0.9 x 1.1 cm enhancing lesion in left mid kidney. Repeat MRI 03/2020. Followed by Dr. Kathlene Cote and Dr. Diamantina Providence who feel mass shows minimal to no change. Surveillance recommended with follow up MRI in 09/2020. Urology managing.   Disposition:  Retacrit today 20,000 units. Continue weekly  I discussed the assessment and treatment plan with the patient. The patient was provided an opportunity to ask questions and all were answered. The patient agreed with the plan and demonstrated an understanding of the instructions.   The patient was advised to call back or seek an in-person evaluation if the symptoms worsen or if the condition fails to improve as anticipated.   I spent 30 minutes face-to-face visit time dedicated to the care of this patient on the date of this encounter to including pre-visit review of medical  oncology notes & previous labs & imaging (MRI, u/s), face-to-face time with the patient, and  post visit ordering of testing/documentation.    Beckey Rutter, DNP, AGNP-C [facility]

## 2020-05-30 ENCOUNTER — Encounter: Payer: Self-pay | Admitting: Nurse Practitioner

## 2020-05-31 ENCOUNTER — Telehealth: Payer: Self-pay | Admitting: Nurse Practitioner

## 2020-05-31 NOTE — Telephone Encounter (Signed)
Spoke to patient. Plan is to continue retacrit 20,000 units weekly. I've asked scheduling to contact him to update appointments.

## 2020-06-04 ENCOUNTER — Other Ambulatory Visit: Payer: Self-pay

## 2020-06-04 ENCOUNTER — Inpatient Hospital Stay: Payer: Medicare Other

## 2020-06-04 ENCOUNTER — Ambulatory Visit: Payer: Medicare Other

## 2020-06-04 ENCOUNTER — Other Ambulatory Visit: Payer: Medicare Other

## 2020-06-04 VITALS — BP 112/72 | HR 66 | Temp 97.0°F | Resp 18

## 2020-06-04 DIAGNOSIS — D461 Refractory anemia with ring sideroblasts: Secondary | ICD-10-CM | POA: Diagnosis not present

## 2020-06-04 LAB — HEMOGLOBIN AND HEMATOCRIT, BLOOD
HCT: 29.8 % — ABNORMAL LOW (ref 39.0–52.0)
Hemoglobin: 9.7 g/dL — ABNORMAL LOW (ref 13.0–17.0)

## 2020-06-04 MED ORDER — EPOETIN ALFA-EPBX 10000 UNIT/ML IJ SOLN
20000.0000 [IU] | Freq: Once | INTRAMUSCULAR | Status: AC
Start: 2020-06-04 — End: 2020-06-04
  Administered 2020-06-04: 20000 [IU] via SUBCUTANEOUS

## 2020-06-10 ENCOUNTER — Inpatient Hospital Stay: Payer: Medicare Other

## 2020-06-10 ENCOUNTER — Ambulatory Visit: Payer: Medicare Other

## 2020-06-10 ENCOUNTER — Inpatient Hospital Stay: Payer: Medicare Other | Admitting: Hematology and Oncology

## 2020-06-11 ENCOUNTER — Inpatient Hospital Stay: Payer: Medicare Other

## 2020-06-11 ENCOUNTER — Ambulatory Visit: Payer: Medicare Other

## 2020-06-11 ENCOUNTER — Other Ambulatory Visit: Payer: Self-pay

## 2020-06-11 ENCOUNTER — Other Ambulatory Visit: Payer: Medicare Other

## 2020-06-11 ENCOUNTER — Ambulatory Visit: Payer: Medicare Other | Admitting: Hematology and Oncology

## 2020-06-11 VITALS — BP 129/69 | HR 62 | Temp 96.9°F | Resp 18

## 2020-06-11 DIAGNOSIS — D461 Refractory anemia with ring sideroblasts: Secondary | ICD-10-CM

## 2020-06-11 LAB — HEMOGLOBIN AND HEMATOCRIT, BLOOD
HCT: 28 % — ABNORMAL LOW (ref 39.0–52.0)
Hemoglobin: 9.2 g/dL — ABNORMAL LOW (ref 13.0–17.0)

## 2020-06-11 MED ORDER — EPOETIN ALFA-EPBX 10000 UNIT/ML IJ SOLN
20000.0000 [IU] | Freq: Once | INTRAMUSCULAR | Status: AC
Start: 2020-06-11 — End: 2020-06-11
  Administered 2020-06-11: 20000 [IU] via SUBCUTANEOUS
  Filled 2020-06-11: qty 2

## 2020-06-18 ENCOUNTER — Inpatient Hospital Stay: Payer: Medicare Other

## 2020-06-18 ENCOUNTER — Other Ambulatory Visit: Payer: Self-pay

## 2020-06-18 VITALS — BP 132/74 | HR 68 | Temp 96.0°F | Resp 18

## 2020-06-18 DIAGNOSIS — D461 Refractory anemia with ring sideroblasts: Secondary | ICD-10-CM

## 2020-06-18 LAB — HEMOGLOBIN AND HEMATOCRIT, BLOOD
HCT: 27.5 % — ABNORMAL LOW (ref 39.0–52.0)
Hemoglobin: 8.9 g/dL — ABNORMAL LOW (ref 13.0–17.0)

## 2020-06-18 MED ORDER — EPOETIN ALFA-EPBX 10000 UNIT/ML IJ SOLN
20000.0000 [IU] | Freq: Once | INTRAMUSCULAR | Status: AC
Start: 2020-06-18 — End: 2020-06-18
  Administered 2020-06-18: 20000 [IU] via SUBCUTANEOUS

## 2020-06-25 ENCOUNTER — Inpatient Hospital Stay: Payer: Medicare Other

## 2020-06-25 ENCOUNTER — Other Ambulatory Visit: Payer: Self-pay

## 2020-06-25 VITALS — BP 136/77 | HR 62 | Temp 96.0°F | Resp 18

## 2020-06-25 DIAGNOSIS — D461 Refractory anemia with ring sideroblasts: Secondary | ICD-10-CM

## 2020-06-25 LAB — HEMOGLOBIN AND HEMATOCRIT, BLOOD
HCT: 27.9 % — ABNORMAL LOW (ref 39.0–52.0)
Hemoglobin: 9 g/dL — ABNORMAL LOW (ref 13.0–17.0)

## 2020-06-25 MED ORDER — EPOETIN ALFA-EPBX 10000 UNIT/ML IJ SOLN
20000.0000 [IU] | Freq: Once | INTRAMUSCULAR | Status: AC
Start: 2020-06-25 — End: 2020-06-25
  Administered 2020-06-25: 20000 [IU] via SUBCUTANEOUS

## 2020-06-27 DIAGNOSIS — M19011 Primary osteoarthritis, right shoulder: Secondary | ICD-10-CM | POA: Diagnosis not present

## 2020-06-27 DIAGNOSIS — W1849XA Other slipping, tripping and stumbling without falling, initial encounter: Secondary | ICD-10-CM | POA: Diagnosis not present

## 2020-06-27 DIAGNOSIS — S46011A Strain of muscle(s) and tendon(s) of the rotator cuff of right shoulder, initial encounter: Secondary | ICD-10-CM | POA: Diagnosis not present

## 2020-06-27 DIAGNOSIS — M25511 Pain in right shoulder: Secondary | ICD-10-CM | POA: Diagnosis not present

## 2020-07-02 ENCOUNTER — Telehealth: Payer: Self-pay | Admitting: Nurse Practitioner

## 2020-07-02 DIAGNOSIS — M19011 Primary osteoarthritis, right shoulder: Secondary | ICD-10-CM | POA: Diagnosis not present

## 2020-07-02 DIAGNOSIS — M2540 Effusion, unspecified joint: Secondary | ICD-10-CM | POA: Diagnosis not present

## 2020-07-02 DIAGNOSIS — S46011A Strain of muscle(s) and tendon(s) of the rotator cuff of right shoulder, initial encounter: Secondary | ICD-10-CM | POA: Diagnosis not present

## 2020-07-02 DIAGNOSIS — X500XXA Overexertion from strenuous movement or load, initial encounter: Secondary | ICD-10-CM | POA: Diagnosis not present

## 2020-07-02 DIAGNOSIS — M659 Synovitis and tenosynovitis, unspecified: Secondary | ICD-10-CM | POA: Diagnosis not present

## 2020-07-02 DIAGNOSIS — M65811 Other synovitis and tenosynovitis, right shoulder: Secondary | ICD-10-CM | POA: Diagnosis not present

## 2020-07-02 DIAGNOSIS — M25411 Effusion, right shoulder: Secondary | ICD-10-CM | POA: Diagnosis not present

## 2020-07-02 NOTE — Telephone Encounter (Signed)
:   patient's wife called and stated that he tried to go to [facility] today for his injection. She was concerned that they are skipping a week and just wanted to make sure that was ok. Patient currently scheduled to return for labs/retacrit on 6/14. She would like a return call to confirm.

## 2020-07-08 DIAGNOSIS — M75101 Unspecified rotator cuff tear or rupture of right shoulder, not specified as traumatic: Secondary | ICD-10-CM | POA: Diagnosis not present

## 2020-07-08 DIAGNOSIS — M12811 Other specific arthropathies, not elsewhere classified, right shoulder: Secondary | ICD-10-CM | POA: Diagnosis not present

## 2020-07-09 ENCOUNTER — Other Ambulatory Visit: Payer: Self-pay

## 2020-07-09 ENCOUNTER — Inpatient Hospital Stay: Payer: Medicare Other

## 2020-07-09 ENCOUNTER — Inpatient Hospital Stay: Payer: Medicare Other | Attending: Nurse Practitioner

## 2020-07-09 VITALS — BP 137/83 | HR 67 | Temp 97.0°F | Resp 18

## 2020-07-09 DIAGNOSIS — D461 Refractory anemia with ring sideroblasts: Secondary | ICD-10-CM | POA: Insufficient documentation

## 2020-07-09 LAB — HEMOGLOBIN AND HEMATOCRIT, BLOOD
HCT: 27 % — ABNORMAL LOW (ref 39.0–52.0)
Hemoglobin: 8.9 g/dL — ABNORMAL LOW (ref 13.0–17.0)

## 2020-07-09 MED ORDER — EPOETIN ALFA-EPBX 10000 UNIT/ML IJ SOLN
20000.0000 [IU] | Freq: Once | INTRAMUSCULAR | Status: AC
Start: 2020-07-09 — End: 2020-07-09
  Administered 2020-07-09: 20000 [IU] via SUBCUTANEOUS

## 2020-07-16 ENCOUNTER — Inpatient Hospital Stay: Payer: Medicare Other

## 2020-07-16 ENCOUNTER — Other Ambulatory Visit: Payer: Self-pay

## 2020-07-16 VITALS — BP 125/83 | HR 78 | Temp 96.4°F | Resp 18

## 2020-07-16 DIAGNOSIS — D461 Refractory anemia with ring sideroblasts: Secondary | ICD-10-CM

## 2020-07-16 LAB — HEMOGLOBIN AND HEMATOCRIT, BLOOD
HCT: 28.3 % — ABNORMAL LOW (ref 39.0–52.0)
Hemoglobin: 9.5 g/dL — ABNORMAL LOW (ref 13.0–17.0)

## 2020-07-16 MED ORDER — EPOETIN ALFA-EPBX 10000 UNIT/ML IJ SOLN
20000.0000 [IU] | Freq: Once | INTRAMUSCULAR | Status: AC
Start: 2020-07-16 — End: 2020-07-16
  Administered 2020-07-16: 20000 [IU] via SUBCUTANEOUS

## 2020-07-23 ENCOUNTER — Inpatient Hospital Stay: Payer: Medicare Other

## 2020-07-23 ENCOUNTER — Encounter: Payer: Self-pay | Admitting: Family Medicine

## 2020-07-23 ENCOUNTER — Ambulatory Visit: Payer: Medicare Other | Admitting: Urology

## 2020-07-23 ENCOUNTER — Other Ambulatory Visit: Payer: Self-pay

## 2020-07-23 ENCOUNTER — Ambulatory Visit (INDEPENDENT_AMBULATORY_CARE_PROVIDER_SITE_OTHER): Payer: Medicare Other | Admitting: Family Medicine

## 2020-07-23 ENCOUNTER — Inpatient Hospital Stay: Payer: Medicare Other | Attending: Nurse Practitioner

## 2020-07-23 VITALS — BP 121/74 | HR 66 | Temp 97.4°F | Resp 18

## 2020-07-23 VITALS — BP 118/60 | HR 64 | Ht 70.0 in | Wt 234.0 lb

## 2020-07-23 DIAGNOSIS — D461 Refractory anemia with ring sideroblasts: Secondary | ICD-10-CM | POA: Insufficient documentation

## 2020-07-23 DIAGNOSIS — R17 Unspecified jaundice: Secondary | ICD-10-CM

## 2020-07-23 DIAGNOSIS — E785 Hyperlipidemia, unspecified: Secondary | ICD-10-CM | POA: Diagnosis not present

## 2020-07-23 DIAGNOSIS — Z0000 Encounter for general adult medical examination without abnormal findings: Secondary | ICD-10-CM

## 2020-07-23 LAB — HEMOCCULT GUIAC POC 1CARD (OFFICE): Fecal Occult Blood, POC: NEGATIVE

## 2020-07-23 LAB — HEMOGLOBIN AND HEMATOCRIT, BLOOD
HCT: 27 % — ABNORMAL LOW (ref 39.0–52.0)
Hemoglobin: 8.8 g/dL — ABNORMAL LOW (ref 13.0–17.0)

## 2020-07-23 MED ORDER — EPOETIN ALFA-EPBX 10000 UNIT/ML IJ SOLN
20000.0000 [IU] | Freq: Once | INTRAMUSCULAR | Status: AC
Start: 2020-07-23 — End: 2020-07-23
  Administered 2020-07-23: 20000 [IU] via SUBCUTANEOUS

## 2020-07-23 NOTE — Progress Notes (Signed)
Date:  07/23/2020   Name:  Brett Boone   DOB:  1945-10-19   MRN:  [id number]   Chief Complaint: Annual Exam  Patient is a 75 year old male who presents for a comprehensive physical exam. The patient reports the following problems: pending shoulder surgery. Health maintenance has been reviewed up to date.     Lab Results  Component Value Date   CREATININE 1.01 05/28/2020   BUN 25 (H) 05/28/2020   NA 138 05/28/2020   K 4.8 05/28/2020   CL 106 05/28/2020   CO2 29 05/28/2020   Lab Results  Component Value Date   CHOL 70 (L) 07/19/2019   HDL 45 07/19/2019   LDLCALC 13 07/19/2019   TRIG 40 07/19/2019   CHOLHDL 1.4 01/04/2018   Lab Results  Component Value Date   TSH 1.275 08/15/2019   Lab Results  Component Value Date   HGBA1C 5.8 (H) 08/30/2014   Lab Results  Component Value Date   WBC 5.0 05/28/2020   HGB 9.5 (L) 07/16/2020   HCT 28.3 (L) 07/16/2020   MCV 107.6 (H) 05/28/2020   PLT 273 05/28/2020   Lab Results  Component Value Date   ALT 16 05/28/2020   AST 21 05/28/2020   ALKPHOS 35 (L) 05/28/2020   BILITOT 1.3 (H) 05/28/2020     Review of Systems  Constitutional:  Negative for chills and fever.  HENT:  Negative for drooling, ear discharge, ear pain and sore throat.   Respiratory:  Negative for cough, shortness of breath and wheezing.   Cardiovascular:  Negative for chest pain, palpitations and leg swelling.  Gastrointestinal:  Negative for abdominal pain, blood in stool, constipation, diarrhea and nausea.  Endocrine: Negative for polydipsia.  Genitourinary:  Negative for dysuria, frequency, hematuria and urgency.  Musculoskeletal:  Negative for back pain, myalgias and neck pain.  Skin:  Negative for rash.  Allergic/Immunologic: Negative for environmental allergies.  Neurological:  Negative for dizziness and headaches.  Hematological:  Does not bruise/bleed easily.  Psychiatric/Behavioral:  Negative for suicidal ideas. The patient is not  nervous/anxious.    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   Macrocytic anemia 08/15/2019   Weight loss 08/15/2019   Elevated ferritin 08/15/2019   Erectile dysfunction 07/25/2019   Class 2 severe obesity due to excess calories with serious comorbidity and body mass index (BMI) of 36.0 to 36.9 in adult [facility]) 07/19/2019   History of adenomatous polyp of colon    Annual physical exam 08/30/2014   Epigastric hernia 07/18/2014    Allergies  Allergen Reactions   Amoxicillin-Pot Clavulanate Hives and Nausea And Vomiting    Past Surgical History:  Procedure Laterality Date   CATARACT EXTRACTION W/PHACO Right 03/09/2017   Procedure: CATARACT EXTRACTION PHACO AND INTRAOCULAR LENS PLACEMENT ([place]) RIGHT;  Surgeon: Eulogio Bear, MD;  Location: [place];  Service: Ophthalmology;  Laterality: Right;   COLONOSCOPY  2013   normal- cleared for 5 yrs- Dr Beckey Downing   COLONOSCOPY WITH PROPOFOL N/A 02/17/2017   Procedure: COLONOSCOPY WITH PROPOFOL;  Surgeon: Lin Landsman, MD;  Location: [place];  Service: Endoscopy;  Laterality: N/A;   COLONOSCOPY WITH PROPOFOL N/A 10/12/2019   Procedure: COLONOSCOPY WITH BIOPSY;  Surgeon: Lin Landsman, MD;  Location: [facility];  Service: Endoscopy;  Laterality: N/A;   EPIGASTRIC HERNIA REPAIR N/A 07/06/2014   Procedure: HERNIA REPAIR EPIGASTRIC ADULT;  Surgeon: Molly Maduro, MD;  Location: [facility];  Service: General;  Laterality: N/A;   HERNIA REPAIR Bilateral    INSERTION OF MESH N/A 07/06/2014   Procedure: INSERTION OF MESH;  Surgeon: Molly Maduro, MD;  Location: [facility];  Service: General;  Laterality: N/A;   IR RADIOLOGIST EVAL & MGMT  11/07/2019   IR RADIOLOGIST EVAL & MGMT  04/25/2020    POLYPECTOMY  02/17/2017   Procedure: POLYPECTOMY;  Surgeon: Lin Landsman, MD;  Location: [place];  Service: Endoscopy;;   POLYPECTOMY N/A 10/12/2019   Procedure: POLYPECTOMY;  Surgeon: Lin Landsman, MD;  Location: [place];  Service: Endoscopy;  Laterality: N/A;   SHOULDER ARTHROSCOPY Left 2008   SINUS EXPLORATION      Social History   Tobacco Use   Smoking status: Former    Packs/day: 0.25    Years: 50.00    Pack years: 12.50    Types: Cigarettes    Quit date: 05/03/2018    Years since quitting: 2.2   Smokeless tobacco: Former   Tobacco comments:    pt quit smoking  Vaping Use   Vaping Use: Never used  Substance Use Topics   Alcohol use: Yes    Comment: Occasional -1x/month   Drug use: No     Medication list has been reviewed and updated.  Current Meds  Medication Sig   Calcium Carbonate-Vit D-Min (CALCIUM 1200 PO) Take by mouth.   tadalafil (CIALIS) 5 MG tablet Take 1 tablet (5 mg total) by mouth daily.    PHQ 2/9 Scores 07/23/2020 02/07/2020 07/26/2019 07/19/2019  PHQ - 2 Score 0 0 0 0  PHQ- 9 Score 0 - 0 0    GAD 7 : Generalized Anxiety Score 07/23/2020 07/26/2019 07/19/2019 06/07/2019  Nervous, Anxious, on Edge 0 0 0 1  Control/stop worrying 0 0 0 0  Worry too much - different things 0 0 0 0  Trouble relaxing 0 0 0 0  Restless 0 0 0 0  Easily annoyed or irritable 0 0 0 0  Afraid - awful might happen 0 0 0 0  Total GAD 7 Score 0 0 0 1  Anxiety Difficulty - - Not difficult at all Not difficult at all    BP Readings from Last 3 Encounters:  07/23/20 118/60  07/16/20 125/83  07/09/20 137/83    Physical Exam Vitals and nursing note reviewed.  Constitutional:      Appearance: Normal appearance. He is well-developed, well-groomed and overweight.  HENT:     Head: Normocephalic.     Jaw: There is normal jaw occlusion.     Right Ear: Hearing, tympanic membrane, ear canal and external ear normal. There is no impacted cerumen.      Left Ear: Hearing, tympanic membrane, ear canal and external ear normal. There is no impacted cerumen.     Nose: Nose normal. No congestion or rhinorrhea.     Mouth/Throat:     Lips: Pink.     Mouth: Mucous membranes are moist.     Dentition: Normal dentition.     Tongue: No lesions.     Pharynx: Oropharynx is clear. Uvula midline.     Tonsils: No tonsillar exudate or tonsillar abscesses.  Eyes:     General: Lids are normal. Vision grossly intact. Gaze aligned appropriately. No scleral icterus.  Right eye: No discharge.        Left eye: No discharge.     Extraocular Movements: Extraocular movements intact.     Conjunctiva/sclera: Conjunctivae normal.     Pupils: Pupils are equal, round, and reactive to light.     Funduscopic exam:    Right eye: Red reflex present.        Left eye: Red reflex present. Neck:     Thyroid: No thyroid mass, thyromegaly or thyroid tenderness.     Vascular: No JVD.     Trachea: No tracheal deviation.  Cardiovascular:     Rate and Rhythm: Normal rate and regular rhythm.     Pulses: Normal pulses.          Carotid pulses are 2+ on the right side and 2+ on the left side.      Radial pulses are 2+ on the right side and 2+ on the left side.       Femoral pulses are 2+ on the right side and 2+ on the left side.      Popliteal pulses are 2+ on the right side and 2+ on the left side.       Dorsalis pedis pulses are 2+ on the right side and 2+ on the left side.       Posterior tibial pulses are 2+ on the right side and 2+ on the left side.     Heart sounds: Normal heart sounds, S1 normal and S2 normal. No murmur heard. No systolic murmur is present.  No diastolic murmur is present.    No friction rub. No gallop. No S3 or S4 sounds.  Pulmonary:     Effort: No respiratory distress.     Breath sounds: Normal breath sounds. No decreased breath sounds, wheezing, rhonchi or rales.  Chest:     Chest wall: No mass.  Breasts:    Right: Normal. No axillary  adenopathy or supraclavicular adenopathy.     Left: Normal. No axillary adenopathy or supraclavicular adenopathy.  Abdominal:     General: Bowel sounds are normal.     Palpations: Abdomen is soft. There is no hepatomegaly, splenomegaly or mass.     Tenderness: There is no abdominal tenderness. There is no right CVA tenderness, left CVA tenderness, guarding or rebound.     Hernia: There is no hernia in the umbilical area, ventral area, left inguinal area or right inguinal area.  Genitourinary:    Penis: Normal and circumcised.      Testes:        Right: Testicular hydrocele present. Mass, tenderness, swelling or varicocele not present.        Left: Mass, tenderness, swelling, testicular hydrocele or varicocele not present.     Epididymis:     Right: Normal.     Left: Normal.  Musculoskeletal:        General: No tenderness. Normal range of motion.     Cervical back: Full passive range of motion without pain, normal range of motion and neck supple.     Right lower leg: No edema.     Left lower leg: No edema.  Lymphadenopathy:     Head:     Right side of head: No submandibular adenopathy.     Left side of head: No submandibular adenopathy.     Cervical: No cervical adenopathy.     Right cervical: No superficial, deep or posterior cervical adenopathy.    Left cervical: No superficial, deep or posterior cervical adenopathy.  Upper Body:     Right upper body: No supraclavicular or axillary adenopathy.     Left upper body: No supraclavicular or axillary adenopathy.     Lower Body: No right inguinal adenopathy. No left inguinal adenopathy.  Skin:    General: Skin is warm.     Capillary Refill: Capillary refill takes less than 2 seconds.     Findings: No rash.  Neurological:     Mental Status: He is alert and oriented to person, place, and time.     Cranial Nerves: Cranial nerves are intact. No cranial nerve deficit.     Sensory: Sensation is intact.     Motor: Motor function is  intact.     Deep Tendon Reflexes: Reflexes are normal and symmetric.     Reflex Scores:      Tricep reflexes are 2+ on the right side and 2+ on the left side.      Bicep reflexes are 2+ on the right side and 2+ on the left side.      Brachioradialis reflexes are 2+ on the right side and 2+ on the left side.      Patellar reflexes are 2+ on the right side and 2+ on the left side.      Achilles reflexes are 2+ on the right side and 2+ on the left side. Psychiatric:        Behavior: Behavior is cooperative.    Wt Readings from Last 3 Encounters:  07/23/20 234 lb (106.1 kg)  05/28/20 239 lb 8.5 oz (108.7 kg)  04/30/20 238 lb (108 kg)    BP 118/60   Pulse 64   Ht 5\' 10"  (1.778 m)   Wt 234 lb (106.1 kg)   BMI 33.58 kg/m   Assessment and Plan: Brett Boone is a 75 y.o. male who presents today for his Complete Annual Exam. He feels well. He reports exercising fairly well. He reports he is sleeping well.  Patient's chart was reviewed for previous encounters most recent labs most recent imaging in [place].  1. Annual physical exam Immunizations are reviewed and recommendations provided.   Age appropriate screening tests are discussed. Counseling given for risk factor reduction interventions.  No subjective/objective concerns noted during history and physical exam.  There is no abnormality noted during physical exam. - POCT occult blood stool  2. Mild hyperlipidemia Chronic.  Controlled.  Stable.  Will check lipid panel for current level of control - Lipid Panel With LDL/HDL Ratio  3. Elevated bilirubin Patient with history of elevated bilirubin in the past we will recheck thyroid function to reevaluate. - Hepatic Function Panel (6)

## 2020-07-24 LAB — HEPATIC FUNCTION PANEL (6)
ALT: 12 IU/L (ref 0–44)
AST: 7 IU/L (ref 0–40)
Albumin: 4.1 g/dL (ref 3.7–4.7)
Alkaline Phosphatase: 42 IU/L — ABNORMAL LOW (ref 44–121)
Bilirubin Total: 0.8 mg/dL (ref 0.0–1.2)
Bilirubin, Direct: 0.33 mg/dL (ref 0.00–0.40)

## 2020-07-24 LAB — LIPID PANEL WITH LDL/HDL RATIO
Cholesterol, Total: 61 mg/dL — ABNORMAL LOW (ref 100–199)
HDL: 38 mg/dL — ABNORMAL LOW (ref >39)
LDL Chol Calc (NIH): 12 mg/dL (ref 0–99)
LDL/HDL Ratio: 0.3 ratio (ref 0.0–3.6)
Triglycerides: 33 mg/dL (ref 0–149)
VLDL Cholesterol Cal: 11 mg/dL (ref 5–40)

## 2020-07-25 ENCOUNTER — Encounter: Payer: Self-pay | Admitting: Family Medicine

## 2020-07-26 DIAGNOSIS — D461 Refractory anemia with ring sideroblasts: Secondary | ICD-10-CM | POA: Diagnosis not present

## 2020-07-26 DIAGNOSIS — D539 Nutritional anemia, unspecified: Secondary | ICD-10-CM | POA: Diagnosis not present

## 2020-07-26 DIAGNOSIS — W1849XD Other slipping, tripping and stumbling without falling, subsequent encounter: Secondary | ICD-10-CM | POA: Diagnosis not present

## 2020-07-26 DIAGNOSIS — R011 Cardiac murmur, unspecified: Secondary | ICD-10-CM | POA: Diagnosis not present

## 2020-07-26 DIAGNOSIS — R9431 Abnormal electrocardiogram [ECG] [EKG]: Secondary | ICD-10-CM | POA: Diagnosis not present

## 2020-07-26 DIAGNOSIS — M12811 Other specific arthropathies, not elsewhere classified, right shoulder: Secondary | ICD-10-CM | POA: Diagnosis not present

## 2020-07-26 DIAGNOSIS — Z87891 Personal history of nicotine dependence: Secondary | ICD-10-CM | POA: Diagnosis not present

## 2020-07-26 DIAGNOSIS — M75101 Unspecified rotator cuff tear or rupture of right shoulder, not specified as traumatic: Secondary | ICD-10-CM | POA: Diagnosis not present

## 2020-07-26 DIAGNOSIS — Z01818 Encounter for other preprocedural examination: Secondary | ICD-10-CM | POA: Diagnosis not present

## 2020-07-26 DIAGNOSIS — S46011D Strain of muscle(s) and tendon(s) of the rotator cuff of right shoulder, subsequent encounter: Secondary | ICD-10-CM | POA: Diagnosis not present

## 2020-07-26 DIAGNOSIS — Z79899 Other long term (current) drug therapy: Secondary | ICD-10-CM | POA: Diagnosis not present

## 2020-07-27 ENCOUNTER — Encounter: Payer: Self-pay | Admitting: Hematology and Oncology

## 2020-07-30 ENCOUNTER — Telehealth: Payer: Self-pay | Admitting: *Deleted

## 2020-07-30 ENCOUNTER — Inpatient Hospital Stay: Payer: Medicare Other | Attending: Nurse Practitioner

## 2020-07-30 ENCOUNTER — Other Ambulatory Visit: Payer: Self-pay

## 2020-07-30 VITALS — BP 127/82 | HR 61 | Temp 97.0°F | Resp 18

## 2020-07-30 DIAGNOSIS — D461 Refractory anemia with ring sideroblasts: Secondary | ICD-10-CM | POA: Diagnosis not present

## 2020-07-30 LAB — HEMOGLOBIN AND HEMATOCRIT, BLOOD
HCT: 27.7 % — ABNORMAL LOW (ref 39.0–52.0)
Hemoglobin: 9.1 g/dL — ABNORMAL LOW (ref 13.0–17.0)

## 2020-07-30 MED ORDER — EPOETIN ALFA-EPBX 10000 UNIT/ML IJ SOLN
20000.0000 [IU] | Freq: Once | INTRAMUSCULAR | Status: AC
  Administered 2020-07-30: 20000 [IU] via SUBCUTANEOUS

## 2020-07-30 NOTE — Telephone Encounter (Signed)
RN Msg from front desk "pt is here for a lab and injection today. he has surgery on Thursday and has to take Gabapentin after his surgery. In the medication guide he was given it states that it may affect liver or blood cells and since he is being treated for a blood cell disorder here, he has questions on whether or not he shouldtake it."

## 2020-07-30 NOTE — Telephone Encounter (Signed)
Heather- anemia from gabapentin is a rare side effect less than 1% cases. If his pain is an issue and his surgeon feels gabapentin will help him, he can take it. I dont think it is going to make his anemia worse.

## 2020-07-30 NOTE — Telephone Encounter (Signed)
Spoke with Patient. He is scheduled for Right Arthroscopic surgery on 7/7. He was given a script by surgeon to use gabapentin for neuropathic pain s/p surgery x 2-3 weeks. Pt read the gabapentin package insert and the potential side effects of anemia. He is concerned that his hgb would drop with the use of this drug. I reached out to Dr. Janese Banks, who recommended holding the gabapentin x 1 week post op given the patient's concerns and potential side effect. Pt informed of Dr. Elroy Channel recommendation. He will also reach out to the surgeon to determine if the surgeon has any other pain mgmt alternative besides the gabapentin. He understands that the cancer center will be checking his labs weekly. Pt reassured that his labs would be monitored weekly and possible retacrit injection. He thanked me for taking the time to speak to him about his concerns. He will be in touch with his surgeon to further discussed.

## 2020-08-01 DIAGNOSIS — Z79899 Other long term (current) drug therapy: Secondary | ICD-10-CM | POA: Diagnosis not present

## 2020-08-01 DIAGNOSIS — J439 Emphysema, unspecified: Secondary | ICD-10-CM | POA: Diagnosis not present

## 2020-08-01 DIAGNOSIS — M12811 Other specific arthropathies, not elsewhere classified, right shoulder: Secondary | ICD-10-CM | POA: Diagnosis not present

## 2020-08-01 DIAGNOSIS — Z87891 Personal history of nicotine dependence: Secondary | ICD-10-CM | POA: Diagnosis not present

## 2020-08-01 DIAGNOSIS — R011 Cardiac murmur, unspecified: Secondary | ICD-10-CM | POA: Diagnosis not present

## 2020-08-01 DIAGNOSIS — D461 Refractory anemia with ring sideroblasts: Secondary | ICD-10-CM | POA: Diagnosis not present

## 2020-08-01 DIAGNOSIS — M24111 Other articular cartilage disorders, right shoulder: Secondary | ICD-10-CM | POA: Diagnosis not present

## 2020-08-01 DIAGNOSIS — M75101 Unspecified rotator cuff tear or rupture of right shoulder, not specified as traumatic: Secondary | ICD-10-CM | POA: Diagnosis not present

## 2020-08-01 DIAGNOSIS — D539 Nutritional anemia, unspecified: Secondary | ICD-10-CM | POA: Diagnosis not present

## 2020-08-01 DIAGNOSIS — I739 Peripheral vascular disease, unspecified: Secondary | ICD-10-CM | POA: Diagnosis not present

## 2020-08-01 DIAGNOSIS — M75111 Incomplete rotator cuff tear or rupture of right shoulder, not specified as traumatic: Secondary | ICD-10-CM | POA: Diagnosis not present

## 2020-08-01 HISTORY — DX: Other specific arthropathies, not elsewhere classified, right shoulder: M12.811

## 2020-08-01 HISTORY — DX: Unspecified rotator cuff tear or rupture of right shoulder, not specified as traumatic: M75.101

## 2020-08-06 ENCOUNTER — Inpatient Hospital Stay: Payer: Medicare Other

## 2020-08-06 ENCOUNTER — Other Ambulatory Visit: Payer: Self-pay

## 2020-08-06 ENCOUNTER — Other Ambulatory Visit: Payer: Self-pay | Admitting: *Deleted

## 2020-08-06 VITALS — BP 146/75 | HR 68 | Temp 97.0°F | Resp 18

## 2020-08-06 DIAGNOSIS — D461 Refractory anemia with ring sideroblasts: Secondary | ICD-10-CM | POA: Diagnosis not present

## 2020-08-06 LAB — HEMOGLOBIN AND HEMATOCRIT, BLOOD
HCT: 25.3 % — ABNORMAL LOW (ref 39.0–52.0)
Hemoglobin: 8.2 g/dL — ABNORMAL LOW (ref 13.0–17.0)

## 2020-08-06 MED ORDER — EPOETIN ALFA-EPBX 10000 UNIT/ML IJ SOLN
20000.0000 [IU] | Freq: Once | INTRAMUSCULAR | Status: AC
  Administered 2020-08-06: 20000 [IU] via SUBCUTANEOUS

## 2020-08-13 ENCOUNTER — Inpatient Hospital Stay: Payer: Medicare Other

## 2020-08-20 ENCOUNTER — Ambulatory Visit: Payer: Medicare Other

## 2020-08-20 ENCOUNTER — Ambulatory Visit: Payer: Medicare Other | Admitting: Internal Medicine

## 2020-08-20 ENCOUNTER — Other Ambulatory Visit: Payer: Medicare Other

## 2020-08-20 DIAGNOSIS — M25511 Pain in right shoulder: Secondary | ICD-10-CM | POA: Diagnosis not present

## 2020-08-20 DIAGNOSIS — M7581 Other shoulder lesions, right shoulder: Secondary | ICD-10-CM | POA: Diagnosis not present

## 2020-08-20 DIAGNOSIS — Z4789 Encounter for other orthopedic aftercare: Secondary | ICD-10-CM | POA: Diagnosis not present

## 2020-08-20 DIAGNOSIS — M25611 Stiffness of right shoulder, not elsewhere classified: Secondary | ICD-10-CM | POA: Diagnosis not present

## 2020-08-21 ENCOUNTER — Inpatient Hospital Stay: Payer: Medicare Other

## 2020-08-21 ENCOUNTER — Other Ambulatory Visit: Payer: Self-pay

## 2020-08-21 ENCOUNTER — Inpatient Hospital Stay: Payer: Medicare Other | Admitting: Oncology

## 2020-08-21 ENCOUNTER — Encounter: Payer: Self-pay | Admitting: Oncology

## 2020-08-21 VITALS — BP 122/61 | HR 81 | Temp 98.4°F | Resp 18 | Wt 240.3 lb

## 2020-08-21 DIAGNOSIS — D539 Nutritional anemia, unspecified: Secondary | ICD-10-CM | POA: Diagnosis not present

## 2020-08-21 DIAGNOSIS — D461 Refractory anemia with ring sideroblasts: Secondary | ICD-10-CM

## 2020-08-21 DIAGNOSIS — R7989 Other specified abnormal findings of blood chemistry: Secondary | ICD-10-CM | POA: Diagnosis not present

## 2020-08-21 DIAGNOSIS — Z79899 Other long term (current) drug therapy: Secondary | ICD-10-CM | POA: Diagnosis not present

## 2020-08-21 DIAGNOSIS — D649 Anemia, unspecified: Secondary | ICD-10-CM | POA: Diagnosis not present

## 2020-08-21 DIAGNOSIS — Z148 Genetic carrier of other disease: Secondary | ICD-10-CM

## 2020-08-21 LAB — COMPREHENSIVE METABOLIC PANEL
ALT: 11 U/L (ref 0–44)
AST: 15 U/L (ref 15–41)
Albumin: 3.9 g/dL (ref 3.5–5.0)
Alkaline Phosphatase: 47 U/L (ref 38–126)
Anion gap: 3 — ABNORMAL LOW (ref 5–15)
BUN: 14 mg/dL (ref 8–23)
CO2: 29 mmol/L (ref 22–32)
Calcium: 8.8 mg/dL — ABNORMAL LOW (ref 8.9–10.3)
Chloride: 103 mmol/L (ref 98–111)
Creatinine, Ser: 1.01 mg/dL (ref 0.61–1.24)
GFR, Estimated: >60 mL/min (ref >60)
Glucose, Bld: 136 mg/dL — ABNORMAL HIGH (ref 70–99)
Potassium: 4.2 mmol/L (ref 3.5–5.1)
Sodium: 135 mmol/L (ref 135–145)
Total Bilirubin: 1.6 mg/dL — ABNORMAL HIGH (ref 0.3–1.2)
Total Protein: 6.8 g/dL (ref 6.5–8.1)

## 2020-08-21 LAB — IRON AND TIBC
Iron: 70 ug/dL (ref 45–182)
Saturation Ratios: 31 % (ref 17.9–39.5)
TIBC: 224 ug/dL — ABNORMAL LOW (ref 250–450)
UIBC: 154 ug/dL

## 2020-08-21 LAB — CBC WITH DIFFERENTIAL/PLATELET
Abs Immature Granulocytes: 0.04 10*3/uL (ref 0.00–0.07)
Basophils Absolute: 0 10*3/uL (ref 0.0–0.1)
Basophils Relative: 0 %
Eosinophils Absolute: 0.1 10*3/uL (ref 0.0–0.5)
Eosinophils Relative: 1 %
HCT: 25.6 % — ABNORMAL LOW (ref 39.0–52.0)
Hemoglobin: 8.2 g/dL — ABNORMAL LOW (ref 13.0–17.0)
Immature Granulocytes: 1 %
Lymphocytes Relative: 14 %
Lymphs Abs: 1.1 10*3/uL (ref 0.7–4.0)
MCH: 33.3 pg (ref 26.0–34.0)
MCHC: 32 g/dL (ref 30.0–36.0)
MCV: 104.1 fL — ABNORMAL HIGH (ref 80.0–100.0)
Monocytes Absolute: 0.5 10*3/uL (ref 0.1–1.0)
Monocytes Relative: 6 %
Neutro Abs: 6.3 10*3/uL (ref 1.7–7.7)
Neutrophils Relative %: 78 %
Platelets: 302 10*3/uL (ref 150–400)
RBC: 2.46 MIL/uL — ABNORMAL LOW (ref 4.22–5.81)
RDW: 23.1 % — ABNORMAL HIGH (ref 11.5–15.5)
WBC: 8 10*3/uL (ref 4.0–10.5)
nRBC: 0.6 % — ABNORMAL HIGH (ref 0.0–0.2)

## 2020-08-21 LAB — FERRITIN: Ferritin: 824 ng/mL — ABNORMAL HIGH (ref 24–336)

## 2020-08-21 MED ORDER — EPOETIN ALFA-EPBX 10000 UNIT/ML IJ SOLN
30000.0000 [IU] | INTRAMUSCULAR | Status: DC
  Administered 2020-08-21: 30000 [IU] via SUBCUTANEOUS

## 2020-08-21 NOTE — Progress Notes (Signed)
pt states that he will like to know the next steps in his injections. Says he has not felt any different from when he recieved his dx a year ago, but comes in more often now for appts and injections. Labs do not improve,neither get worse. Also, wife states they will be going out of town next week and will like to know if he can have his inj before they leave or is he able to skip a week?

## 2020-08-21 NOTE — Progress Notes (Signed)
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 Bone marrow biopsy on 09/11/2019 revealed macrocytic anemia and hypercellular bone marrow for age with dyspoietic changes primarily involving the erythroid cell lines associated with abundant ring sideroblasts (> 15%).  There was no increase in blasts. Flow cytometry was negative. Cytogenetics revealed 45,X,-Y [20].  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 IPSS score is low.   Baseline EPO level was 63.3 in August 2021.  Patient received Aranesp for a month in September 2021 and was then switched to [place].  He is presently on weekly Retacrit 20,000 units.  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  MRI abdomen in September 2021 showed 1.3 cm lesion in the left kidney concerning for renal cell carcinoma for which he follows up with urology   Interval history-patient underwent surgery for rotator cuff injury after a fall 3 weeks ago.  He is currently recovering from that.  He reports baseline fatigue but denies other new complaints at this time  ECOG PS- 1 Pain scale- 0   Review of systems- Review of Systems  Constitutional:  Positive for malaise/fatigue. Negative for chills, fever and weight loss.  HENT:  Negative for  congestion, ear discharge and nosebleeds.   Eyes:  Negative for blurred vision.  Respiratory:  Negative for cough, hemoptysis, sputum production, shortness of breath and wheezing.   Cardiovascular:  Negative for chest pain, palpitations, orthopnea and claudication.  Gastrointestinal:  Negative for abdominal pain, blood in stool, constipation, diarrhea, heartburn, melena, nausea and vomiting.  Genitourinary:  Negative for dysuria, flank pain, frequency, hematuria and urgency.  Musculoskeletal:  Negative for back pain, joint pain and myalgias.  Skin:  Negative for rash.  Neurological:  Negative for dizziness, tingling, focal weakness, seizures, weakness and headaches.  Endo/Heme/Allergies:  Does not bruise/bleed easily.  Psychiatric/Behavioral:  Negative for depression and suicidal ideas. The patient does not have insomnia.      Allergies  Allergen Reactions   Amoxicillin-Pot Clavulanate Hives and Nausea And Vomiting     Past Medical History:  Diagnosis Date   Anemia    Heart murmur    Hydrocele    Venous stasis dermatitis of both lower extremities      Past Surgical History:  Procedure Laterality Date   CATARACT EXTRACTION W/PHACO Right 03/09/2017   Procedure: CATARACT EXTRACTION PHACO AND INTRAOCULAR LENS PLACEMENT ([place]) RIGHT;  Surgeon: Eulogio Bear, MD;  Location: [place];  Service: Ophthalmology;  Laterality: Right;   COLONOSCOPY  2013   normal- cleared for 5 yrs- Dr Beckey Downing   COLONOSCOPY WITH PROPOFOL N/A 02/17/2017   Procedure: COLONOSCOPY WITH PROPOFOL;  Surgeon: Lin Landsman, MD;  Location: [place];  Service: Endoscopy;  Laterality: N/A;   COLONOSCOPY WITH PROPOFOL N/A 10/12/2019   Procedure: COLONOSCOPY WITH BIOPSY;  Surgeon: Lin Landsman, MD;  Location: [place];  Service: Endoscopy;  Laterality: N/A;   EPIGASTRIC HERNIA REPAIR N/A 07/06/2014   Procedure: HERNIA REPAIR EPIGASTRIC ADULT;  Surgeon: Molly Maduro,  MD;  Location: [facility];  Service: General;  Laterality: N/A;   HERNIA REPAIR Bilateral    INSERTION OF MESH N/A 07/06/2014   Procedure: INSERTION OF MESH;  Surgeon: Molly Maduro, MD;  Location: [facility];  Service: General;  Laterality: N/A;   IR RADIOLOGIST EVAL & MGMT  11/07/2019   IR RADIOLOGIST EVAL & MGMT  04/25/2020   POLYPECTOMY  02/17/2017   Procedure: POLYPECTOMY;  Surgeon: Lin Landsman, MD;  Location: [place];  Service: Endoscopy;;   POLYPECTOMY N/A 10/12/2019   Procedure: POLYPECTOMY;  Surgeon: Lin Landsman, MD;  Location: [place];  Service: Endoscopy;  Laterality: N/A;   SHOULDER ARTHROSCOPY Left 2008   SINUS EXPLORATION      Social History   Socioeconomic History   Marital status: Married    Spouse name: Not on file   Number of children: 3   Years of education: Not on file   Highest education level: Bachelor's degree (e.g., BA, AB, BS)  Occupational History   Occupation: Retired  Tobacco Use   Smoking status: Former    Packs/day: 0.25    Years: 50.00    Pack years: 12.50    Types: Cigarettes    Quit date: 05/03/2018    Years since quitting: 2.3   Smokeless tobacco: Former   Tobacco comments:    pt quit smoking  Vaping Use   Vaping Use: Never used  Substance and Sexual Activity   Alcohol use: Yes    Comment: Occasional -1x/month   Drug use: No   Sexual activity: Yes  Other Topics Concern   Not on file  Social History Narrative   Not on file   Social Determinants of Health   Financial Resource Strain: Low Risk    Difficulty of Paying Living Expenses: Not hard at all  Food Insecurity: No Food Insecurity   Worried About Charity fundraiser in the Last Year: Never true   [place] in the Last Year: Never true  Transportation Needs: No Transportation Needs   Lack of Transportation (Medical): No   Lack of Transportation (Non-Medical): No  Physical Activity: Inactive   Days of Exercise per Week: 0 days   Minutes  of Exercise per Session: 0 min  Stress: No Stress Concern Present   Feeling of Stress : Only a little  Social Connections: Moderately Integrated   Frequency of Communication with Friends and Family: More than three times a week   Frequency of Social Gatherings with Friends and Family: Once a week   Attends Religious Services: More than 4 times per year   Active Member of [employer] or Organizations: No   Attends Music therapist: Never   Marital Status: Married  Human resources officer Violence: Not At Risk   Fear of Current or Ex-Partner: No   Emotionally Abused: No   Physically Abused: No   Sexually Abused: No    Family History  Problem Relation Age of Onset   Diabetes Father    Heart disease Father 96   Stroke Mother      Current Outpatient Medications:    aspirin 81 MG EC tablet, Take by mouth., Disp: , Rfl:    Calcium Carb-Cholecalciferol 600-400 MG-UNIT TABS, Take by mouth. (Patient not taking: Reported on 08/21/2020), Disp: , Rfl:    Calcium  Carbonate-Vit D-Min (CALCIUM 1200 PO), Take by mouth. (Patient not taking: Reported on 08/21/2020), Disp: , Rfl:    docusate sodium (COLACE) 100 MG capsule, Take 100 mg by mouth 2 (two) times daily. (Patient not taking: Reported on 08/21/2020), Disp: , Rfl:    Epoetin Alfa-epbx (RETACRIT IJ), Inject as directed once a week (Patient not taking: Reported on 08/21/2020), Disp: , Rfl:    ondansetron (ZOFRAN) 4 MG tablet, Take 4 mg by mouth every 8 (eight) hours as needed. (Patient not taking: Reported on 08/21/2020), Disp: , Rfl:    oxyCODONE (OXY IR/ROXICODONE) 5 MG immediate release tablet, Take by mouth. (Patient not taking: Reported on 08/21/2020), Disp: , Rfl:    tadalafil (CIALIS) 5 MG tablet, Take 1 tablet (5 mg total) by mouth daily. (Patient not taking: Reported on 08/21/2020), Disp: 30 tablet, Rfl: 11 No current facility-administered medications for this visit.  Facility-Administered Medications Ordered in Other Visits:    epoetin  alfa-epbx (RETACRIT) injection 30,000 Units, 30,000 Units, Subcutaneous, Weekly, Sindy Guadeloupe, MD, 30,000 Units at 08/21/20 1154  Physical exam:  Vitals:   08/21/20 1100  BP: 122/61  Pulse: 81  Resp: 18  Temp: 98.4 F (36.9 C)  SpO2: 100%  Weight: 240 lb 4.8 oz (109 kg)   Physical Exam Constitutional:      General: He is not in acute distress. Cardiovascular:     Rate and Rhythm: Normal rate and regular rhythm.     Heart sounds: Normal heart sounds.  Pulmonary:     Effort: Pulmonary effort is normal.     Breath sounds: Normal breath sounds.  Abdominal:     General: Bowel sounds are normal.     Palpations: Abdomen is soft.  Musculoskeletal:     Comments: Sling in place over right arm  Skin:    General: Skin is warm and dry.  Neurological:     Mental Status: He is alert and oriented to person, place, and time.     CMP Latest Ref Rng & Units 08/21/2020  Glucose 70 - 99 mg/dL 136(H)  BUN 8 - 23 mg/dL 14  Creatinine 0.61 - 1.24 mg/dL 1.01  Sodium 135 - 145 mmol/L 135  Potassium 3.5 - 5.1 mmol/L 4.2  Chloride 98 - 111 mmol/L 103  CO2 22 - 32 mmol/L 29  Calcium 8.9 - 10.3 mg/dL 8.8(L)  Total Protein 6.5 - 8.1 g/dL 6.8  Total Bilirubin 0.3 - 1.2 mg/dL 1.6(H)  Alkaline Phos 38 - 126 U/L 47  AST 15 - 41 U/L 15  ALT 0 - 44 U/L 11   CBC Latest Ref Rng & Units 08/21/2020  WBC 4.0 - 10.5 K/uL 8.0  Hemoglobin 13.0 - 17.0 g/dL 8.2(L)  Hematocrit 39.0 - 52.0 % 25.6(L)  Platelets 150 - 400 K/uL 302     Assessment and plan- Patient is a 75 y.o. 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   I will increase the dose of Retacrit to 30,000 units once a week and see him back in 1 month's time.  If there is no significant improvement in his hemoglobin  despite increasing the dose of Retacrit I will consider switching him to Luspatercept at that time.  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  We will start working on getting insurance approval for this.  Discussed natural history of MDS and risk of progression to AML.  Median overall survival with low risk MDS is about 5.3 years and median time to 25% AML evolution is about 10.8 years  I will see him back in 1 month's time and discuss pros and cons of Luspatercept based on his hemoglobin and response to Retacrit   Visit Diagnosis 1. Elevated ferritin   2. Refractory anemia with ring sideroblasts (HCC)   3. Macrocytic anemia   4. Erythropoietin (EPO) stimulating agent anemia management patient      Dr. Randa Evens, MD, MPH [facility] [phone] 08/21/2020 12:31 PM

## 2020-08-27 ENCOUNTER — Inpatient Hospital Stay: Payer: Medicare Other | Attending: Oncology

## 2020-08-27 ENCOUNTER — Inpatient Hospital Stay: Payer: Medicare Other

## 2020-08-27 ENCOUNTER — Other Ambulatory Visit: Payer: Self-pay

## 2020-08-27 VITALS — BP 120/61 | HR 64 | Temp 97.2°F | Resp 18

## 2020-08-27 DIAGNOSIS — D461 Refractory anemia with ring sideroblasts: Secondary | ICD-10-CM

## 2020-08-27 LAB — HEMOGLOBIN AND HEMATOCRIT, BLOOD
HCT: 27 % — ABNORMAL LOW (ref 39.0–52.0)
Hemoglobin: 8.7 g/dL — ABNORMAL LOW (ref 13.0–17.0)

## 2020-08-27 MED ORDER — EPOETIN ALFA-EPBX 10000 UNIT/ML IJ SOLN
30000.0000 [IU] | INTRAMUSCULAR | Status: DC
  Administered 2020-08-27: 30000 [IU] via SUBCUTANEOUS

## 2020-08-28 ENCOUNTER — Inpatient Hospital Stay: Payer: Medicare Other

## 2020-09-03 DIAGNOSIS — M7581 Other shoulder lesions, right shoulder: Secondary | ICD-10-CM | POA: Diagnosis not present

## 2020-09-03 DIAGNOSIS — M25611 Stiffness of right shoulder, not elsewhere classified: Secondary | ICD-10-CM | POA: Diagnosis not present

## 2020-09-03 DIAGNOSIS — M25511 Pain in right shoulder: Secondary | ICD-10-CM | POA: Diagnosis not present

## 2020-09-03 DIAGNOSIS — Z4789 Encounter for other orthopedic aftercare: Secondary | ICD-10-CM | POA: Diagnosis not present

## 2020-09-04 ENCOUNTER — Other Ambulatory Visit: Payer: Self-pay

## 2020-09-04 ENCOUNTER — Inpatient Hospital Stay: Payer: Medicare Other

## 2020-09-04 VITALS — BP 112/68 | HR 75 | Temp 97.2°F | Resp 18

## 2020-09-04 DIAGNOSIS — D461 Refractory anemia with ring sideroblasts: Secondary | ICD-10-CM | POA: Diagnosis not present

## 2020-09-04 LAB — HEMOGLOBIN AND HEMATOCRIT, BLOOD
HCT: 27.7 % — ABNORMAL LOW (ref 39.0–52.0)
Hemoglobin: 8.9 g/dL — ABNORMAL LOW (ref 13.0–17.0)

## 2020-09-04 MED ORDER — EPOETIN ALFA-EPBX 10000 UNIT/ML IJ SOLN
30000.0000 [IU] | Freq: Once | INTRAMUSCULAR | Status: AC
  Administered 2020-09-04: 30000 [IU] via SUBCUTANEOUS

## 2020-09-04 MED ORDER — EPOETIN ALFA-EPBX 40000 UNIT/ML IJ SOLN: 30000.0000 [IU] | INTRAMUSCULAR | Status: DC

## 2020-09-05 DIAGNOSIS — Z4789 Encounter for other orthopedic aftercare: Secondary | ICD-10-CM | POA: Diagnosis not present

## 2020-09-05 DIAGNOSIS — M25611 Stiffness of right shoulder, not elsewhere classified: Secondary | ICD-10-CM | POA: Diagnosis not present

## 2020-09-05 DIAGNOSIS — M7581 Other shoulder lesions, right shoulder: Secondary | ICD-10-CM | POA: Diagnosis not present

## 2020-09-05 DIAGNOSIS — M25511 Pain in right shoulder: Secondary | ICD-10-CM | POA: Diagnosis not present

## 2020-09-10 DIAGNOSIS — M25611 Stiffness of right shoulder, not elsewhere classified: Secondary | ICD-10-CM | POA: Diagnosis not present

## 2020-09-10 DIAGNOSIS — M7581 Other shoulder lesions, right shoulder: Secondary | ICD-10-CM | POA: Diagnosis not present

## 2020-09-10 DIAGNOSIS — Z4789 Encounter for other orthopedic aftercare: Secondary | ICD-10-CM | POA: Diagnosis not present

## 2020-09-10 DIAGNOSIS — M25511 Pain in right shoulder: Secondary | ICD-10-CM | POA: Diagnosis not present

## 2020-09-11 ENCOUNTER — Inpatient Hospital Stay: Payer: Medicare Other

## 2020-09-11 ENCOUNTER — Other Ambulatory Visit: Payer: Self-pay

## 2020-09-11 VITALS — BP 109/65 | HR 102 | Resp 20

## 2020-09-11 DIAGNOSIS — D461 Refractory anemia with ring sideroblasts: Secondary | ICD-10-CM

## 2020-09-11 LAB — HEMOGLOBIN AND HEMATOCRIT, BLOOD
HCT: 29.4 % — ABNORMAL LOW (ref 39.0–52.0)
Hemoglobin: 9.4 g/dL — ABNORMAL LOW (ref 13.0–17.0)

## 2020-09-11 MED ORDER — EPOETIN ALFA-EPBX 10000 UNIT/ML IJ SOLN
30000.0000 [IU] | INTRAMUSCULAR | Status: DC
  Administered 2020-09-11: 30000 [IU] via SUBCUTANEOUS

## 2020-09-12 DIAGNOSIS — M25511 Pain in right shoulder: Secondary | ICD-10-CM | POA: Diagnosis not present

## 2020-09-12 DIAGNOSIS — M7581 Other shoulder lesions, right shoulder: Secondary | ICD-10-CM | POA: Diagnosis not present

## 2020-09-12 DIAGNOSIS — M25611 Stiffness of right shoulder, not elsewhere classified: Secondary | ICD-10-CM | POA: Diagnosis not present

## 2020-09-12 DIAGNOSIS — Z4789 Encounter for other orthopedic aftercare: Secondary | ICD-10-CM | POA: Diagnosis not present

## 2020-09-17 DIAGNOSIS — M7581 Other shoulder lesions, right shoulder: Secondary | ICD-10-CM | POA: Diagnosis not present

## 2020-09-17 DIAGNOSIS — Z4789 Encounter for other orthopedic aftercare: Secondary | ICD-10-CM | POA: Diagnosis not present

## 2020-09-17 DIAGNOSIS — M25611 Stiffness of right shoulder, not elsewhere classified: Secondary | ICD-10-CM | POA: Diagnosis not present

## 2020-09-17 DIAGNOSIS — M25511 Pain in right shoulder: Secondary | ICD-10-CM | POA: Diagnosis not present

## 2020-09-18 ENCOUNTER — Inpatient Hospital Stay: Payer: Medicare Other

## 2020-09-18 ENCOUNTER — Inpatient Hospital Stay (HOSPITAL_BASED_OUTPATIENT_CLINIC_OR_DEPARTMENT_OTHER): Payer: Medicare Other | Admitting: Oncology

## 2020-09-18 ENCOUNTER — Encounter: Payer: Self-pay | Admitting: Oncology

## 2020-09-18 ENCOUNTER — Other Ambulatory Visit: Payer: Self-pay

## 2020-09-18 VITALS — BP 117/69 | HR 79 | Temp 98.3°F | Resp 18 | Wt 242.9 lb

## 2020-09-18 DIAGNOSIS — Z79899 Other long term (current) drug therapy: Secondary | ICD-10-CM

## 2020-09-18 DIAGNOSIS — R7989 Other specified abnormal findings of blood chemistry: Secondary | ICD-10-CM | POA: Diagnosis not present

## 2020-09-18 DIAGNOSIS — D539 Nutritional anemia, unspecified: Secondary | ICD-10-CM

## 2020-09-18 DIAGNOSIS — D461 Refractory anemia with ring sideroblasts: Secondary | ICD-10-CM | POA: Diagnosis not present

## 2020-09-18 DIAGNOSIS — D649 Anemia, unspecified: Secondary | ICD-10-CM

## 2020-09-18 LAB — CBC WITH DIFFERENTIAL/PLATELET
Abs Immature Granulocytes: 0.03 10*3/uL (ref 0.00–0.07)
Basophils Absolute: 0.1 10*3/uL (ref 0.0–0.1)
Basophils Relative: 1 %
Eosinophils Absolute: 0.4 10*3/uL (ref 0.0–0.5)
Eosinophils Relative: 6 %
HCT: 29.9 % — ABNORMAL LOW (ref 39.0–52.0)
Hemoglobin: 9.8 g/dL — ABNORMAL LOW (ref 13.0–17.0)
Immature Granulocytes: 1 %
Lymphocytes Relative: 21 %
Lymphs Abs: 1.3 10*3/uL (ref 0.7–4.0)
MCH: 32.9 pg (ref 26.0–34.0)
MCHC: 32.8 g/dL (ref 30.0–36.0)
MCV: 100.3 fL — ABNORMAL HIGH (ref 80.0–100.0)
Monocytes Absolute: 0.4 10*3/uL (ref 0.1–1.0)
Monocytes Relative: 7 %
Neutro Abs: 4 10*3/uL (ref 1.7–7.7)
Neutrophils Relative %: 64 %
Platelets: 327 10*3/uL (ref 150–400)
RBC: 2.98 MIL/uL — ABNORMAL LOW (ref 4.22–5.81)
RDW: 23.1 % — ABNORMAL HIGH (ref 11.5–15.5)
WBC: 6.2 10*3/uL (ref 4.0–10.5)
nRBC: 0.8 % — ABNORMAL HIGH (ref 0.0–0.2)

## 2020-09-18 LAB — IRON AND TIBC
Iron: 162 ug/dL (ref 45–182)
Saturation Ratios: 67 % — ABNORMAL HIGH (ref 17.9–39.5)
TIBC: 242 ug/dL — ABNORMAL LOW (ref 250–450)
UIBC: 80 ug/dL

## 2020-09-18 LAB — FOLATE: Folate: 16.1 ng/mL (ref >5.9)

## 2020-09-18 LAB — VITAMIN B12: Vitamin B-12: 723 pg/mL (ref 180–914)

## 2020-09-18 LAB — FERRITIN: Ferritin: 764 ng/mL — ABNORMAL HIGH (ref 24–336)

## 2020-09-18 MED ORDER — EPOETIN ALFA-EPBX 10000 UNIT/ML IJ SOLN
30000.0000 [IU] | INTRAMUSCULAR | Status: DC
  Administered 2020-09-18: 30000 [IU] via SUBCUTANEOUS

## 2020-09-19 DIAGNOSIS — M25611 Stiffness of right shoulder, not elsewhere classified: Secondary | ICD-10-CM | POA: Diagnosis not present

## 2020-09-19 DIAGNOSIS — M7581 Other shoulder lesions, right shoulder: Secondary | ICD-10-CM | POA: Diagnosis not present

## 2020-09-19 DIAGNOSIS — M25511 Pain in right shoulder: Secondary | ICD-10-CM | POA: Diagnosis not present

## 2020-09-19 DIAGNOSIS — Z4789 Encounter for other orthopedic aftercare: Secondary | ICD-10-CM | POA: Diagnosis not present

## 2020-09-20 ENCOUNTER — Encounter: Payer: Self-pay | Admitting: Hematology and Oncology

## 2020-09-20 NOTE — Progress Notes (Signed)
I connected with Brett Boone on 09/20/20 at 11:15 AM EDT by video enabled telemedicine visit and verified that I am speaking with the correct person using two identifiers.   I discussed the limitations, risks, security and privacy concerns of performing an evaluation and management service by telemedicine and the availability of in-person appointments. I also discussed with the patient that there may be a patient responsible charge related to this service. The patient expressed understanding and agreed to proceed.  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 Bone marrow biopsy on 09/11/2019 revealed macrocytic anemia and hypercellular bone marrow for age with dyspoietic changes primarily involving the erythroid cell lines associated with abundant ring sideroblasts (> 15%).  There was no increase in blasts. Flow cytometry was negative. Cytogenetics revealed 45,X,-Y [20].  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 IPSS score is low.   Baseline EPO level was 63.3 in August 2021.  Patient received Aranesp for a month in September 2021 and was then switched to [place].  He is presently on weekly Retacrit 20,000 units.   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   MRI abdomen in September 2021 showed 1.3 cm lesion in the left kidney concerning for renal cell carcinoma for which he follows up with urology  Interval history. Overall no recent changes in his  health. No recurrent infections. Has baseline fatigue   Review of Systems  Constitutional:  Positive for malaise/fatigue. Negative for chills, fever and weight loss.  HENT:  Negative for congestion, ear discharge and nosebleeds.   Eyes:  Negative for blurred vision.  Respiratory:  Negative for cough, hemoptysis, sputum production, shortness of breath and wheezing.   Cardiovascular:  Negative for chest pain, palpitations, orthopnea and claudication.  Gastrointestinal:  Negative for abdominal pain, blood in stool, constipation, diarrhea, heartburn, melena, nausea and vomiting.  Genitourinary:  Negative for dysuria, flank pain, frequency, hematuria and urgency.  Musculoskeletal:  Negative for back pain, joint pain and myalgias.  Skin:  Negative for rash.  Neurological:  Negative for dizziness, tingling, focal weakness, seizures, weakness and headaches.  Endo/Heme/Allergies:  Does not bruise/bleed easily.  Psychiatric/Behavioral:  Negative for depression and suicidal ideas. The patient does not have insomnia.    Allergies  Allergen Reactions   Amoxicillin-Pot Clavulanate Hives and Nausea And Vomiting    Past Medical History:  Diagnosis Date   Anemia    Heart murmur    Hydrocele    Venous stasis dermatitis of both lower extremities     Past Surgical History:  Procedure Laterality Date   CATARACT EXTRACTION W/PHACO Right 03/09/2017   Procedure: CATARACT EXTRACTION PHACO AND INTRAOCULAR LENS PLACEMENT ([place]) RIGHT;  Surgeon: Eulogio Bear, MD;  Location: [place];  Service: Ophthalmology;  Laterality: Right;   COLONOSCOPY  2013   normal- cleared for 5 yrs- Dr Beckey Downing   COLONOSCOPY WITH PROPOFOL N/A 02/17/2017   Procedure: COLONOSCOPY WITH PROPOFOL;  Surgeon: Lin Landsman, MD;  Location: [place];  Service: Endoscopy;  Laterality: N/A;   COLONOSCOPY WITH PROPOFOL N/A 10/12/2019   Procedure: COLONOSCOPY WITH BIOPSY;  Surgeon: Lin Landsman, MD;   Location: [place];  Service: Endoscopy;  Laterality: N/A;   EPIGASTRIC HERNIA REPAIR N/A 07/06/2014   Procedure: HERNIA REPAIR EPIGASTRIC ADULT;  Surgeon: Molly Maduro, MD;  Location: [facility];  Service: General;  Laterality: N/A;   HERNIA REPAIR Bilateral    INSERTION OF MESH N/A 07/06/2014   Procedure: INSERTION OF MESH;  Surgeon: Molly Maduro, MD;  Location: [facility];  Service: General;  Laterality: N/A;   IR RADIOLOGIST EVAL & MGMT  11/07/2019   IR RADIOLOGIST EVAL & MGMT  04/25/2020   POLYPECTOMY  02/17/2017   Procedure: POLYPECTOMY;  Surgeon: Lin Landsman, MD;  Location: [place];  Service: Endoscopy;;   POLYPECTOMY N/A 10/12/2019   Procedure: POLYPECTOMY;  Surgeon: Lin Landsman, MD;  Location: [place];  Service: Endoscopy;  Laterality: N/A;   SHOULDER ARTHROSCOPY Left 2008   SINUS EXPLORATION      Social History   Socioeconomic History   Marital status: Married    Spouse name: Not on file   Number of children: 3   Years of education: Not on file   Highest education level: Bachelor's degree (e.g., BA, AB, BS)  Occupational History   Occupation: Retired  Tobacco Use   Smoking status: Former    Packs/day: 0.25    Years: 50.00    Pack years: 12.50    Types: Cigarettes    Quit date: 05/03/2018    Years since quitting: 2.3   Smokeless tobacco: Former   Tobacco comments:    pt quit smoking  Vaping Use   Vaping Use: Never used  Substance and Sexual Activity   Alcohol use: Yes    Comment: Occasional -1x/month   Drug use: No   Sexual activity: Yes  Other Topics Concern   Not on file  Social History Narrative   Not on file   Social Determinants of Health   Financial Resource Strain: Low Risk    Difficulty of Paying Living Expenses: Not hard at all  Food Insecurity: No Food Insecurity   Worried About Charity fundraiser in the Last Year: Never true   [place] in the Last Year: Never true  Transportation Needs:  No Transportation Needs   Lack of Transportation (Medical): No   Lack of Transportation (Non-Medical): No  Physical Activity: Inactive   Days of Exercise per Week: 0 days   Minutes of Exercise per Session: 0 min  Stress: No Stress Concern Present   Feeling of Stress : Only a little  Social Connections: Moderately Integrated   Frequency of Communication with Friends and Family: More than three times a week   Frequency of Social Gatherings with Friends and Family: Once a week   Attends Religious Services: More than 4 times per year   Active Member of [employer] or Organizations: No   Attends Music therapist: Never   Marital Status: Married  Human resources officer Violence: Not At Risk   Fear of Current or Ex-Partner: No   Emotionally Abused: No   Physically Abused: No   Sexually Abused: No    Family History  Problem Relation Age of Onset   Diabetes Father    Heart disease Father 68   Stroke Mother      Current Outpatient Medications:    Epoetin Alfa-epbx (RETACRIT IJ), , Disp: , Rfl:  tadalafil (CIALIS) 5 MG tablet, Take 1 tablet (5 mg total) by mouth daily., Disp: 30 tablet, Rfl: 11   Calcium Carb-Cholecalciferol 600-400 MG-UNIT TABS, Take by mouth. (Patient not taking: No sig reported), Disp: , Rfl:    Calcium Carbonate-Vit D-Min (CALCIUM 1200 PO), Take by mouth. (Patient not taking: No sig reported), Disp: , Rfl:    docusate sodium (COLACE) 100 MG capsule, Take 100 mg by mouth 2 (two) times daily. (Patient not taking: No sig reported), Disp: , Rfl:    ondansetron (ZOFRAN) 4 MG tablet, Take 4 mg by mouth every 8 (eight) hours as needed. (Patient not taking: No sig reported), Disp: , Rfl:   No results found.  No images are attached to the encounter.   CMP Latest Ref Rng & Units 08/21/2020  Glucose 70 - 99 mg/dL 136(H)  BUN 8 - 23 mg/dL 14  Creatinine 0.61 - 1.24 mg/dL 1.01  Sodium 135 - 145 mmol/L 135  Potassium 3.5 - 5.1 mmol/L 4.2  Chloride 98 - 111 mmol/L 103   CO2 22 - 32 mmol/L 29  Calcium 8.9 - 10.3 mg/dL 8.8(L)  Total Protein 6.5 - 8.1 g/dL 6.8  Total Bilirubin 0.3 - 1.2 mg/dL 1.6(H)  Alkaline Phos 38 - 126 U/L 47  AST 15 - 41 U/L 15  ALT 0 - 44 U/L 11   CBC Latest Ref Rng & Units 09/18/2020  WBC 4.0 - 10.5 K/uL 6.2  Hemoglobin 13.0 - 17.0 g/dL 9.8(L)  Hematocrit 39.0 - 52.0 % 29.9(L)  Platelets 150 - 400 K/uL 327     Observation/objective:appears in no acute distress over video visit. Breathing is non labored  Assessment and plan:Patient is a 75 y.o. 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 He is here for f/u of his anemia and ongoing retacrit  Patient's hemoglobin at baseline on Depo runs between 9-10.  It dipped down to 8.8 in May 2022 when he underwent shoulder surgery and then further down to 8.2 in July 2022.  We increased his dose of Retacrit which she has been receiving at 30,000 units every other week and his hemoglobin has improved to 9.4 presently which is close to his baseline.  As long as he is transfusion free with a hemoglobin around 9 we do not have to switch him to Luspatercept just yet.  He will therefore continue receiving Retacrit at his present dose every other week and I will see him back in 6 weeks with CBC ferritin and iron studies B12 and folate  Written information about Luspatercept has been given to the patient.  Also discussed that Luspatercept was typically done as a subcu injection every 3 weeks until progression or toxicity.  Discussed risks and benefits of Luspatercept including all but not limited to side effect such as hypertension nausea vomiting diarrhea, abnormal lfts, arthralgia  Follow-up instructions:as above  I discussed the assessment and treatment plan with the patient. The patient was provided an opportunity to ask questions and all were answered. The patient agreed with the plan and demonstrated an understanding of the instructions.    The patient was advised to call back or seek an in-person evaluation if the symptoms worsen or if the condition fails to improve as anticipated.    Visit Diagnosis: 1. Refractory anemia with ring sideroblasts (HCC)   2. Elevated ferritin   3. Macrocytic anemia   4. Erythropoietin (EPO) stimulating agent anemia management patient  Dr. Randa Evens, MD, MPH [facility] Tel- [phone] 09/20/2020 11:37 AM

## 2020-09-24 DIAGNOSIS — Z4789 Encounter for other orthopedic aftercare: Secondary | ICD-10-CM | POA: Diagnosis not present

## 2020-09-24 DIAGNOSIS — M25611 Stiffness of right shoulder, not elsewhere classified: Secondary | ICD-10-CM | POA: Diagnosis not present

## 2020-09-24 DIAGNOSIS — M25511 Pain in right shoulder: Secondary | ICD-10-CM | POA: Diagnosis not present

## 2020-09-24 DIAGNOSIS — M7581 Other shoulder lesions, right shoulder: Secondary | ICD-10-CM | POA: Diagnosis not present

## 2020-09-25 ENCOUNTER — Inpatient Hospital Stay: Payer: Medicare Other

## 2020-09-25 ENCOUNTER — Other Ambulatory Visit: Payer: Self-pay

## 2020-09-25 VITALS — BP 126/84

## 2020-09-25 DIAGNOSIS — D461 Refractory anemia with ring sideroblasts: Secondary | ICD-10-CM

## 2020-09-25 DIAGNOSIS — R7989 Other specified abnormal findings of blood chemistry: Secondary | ICD-10-CM

## 2020-09-25 LAB — CBC WITH DIFFERENTIAL/PLATELET
Abs Immature Granulocytes: 0.02 10*3/uL (ref 0.00–0.07)
Basophils Absolute: 0.1 10*3/uL (ref 0.0–0.1)
Basophils Relative: 1 %
Eosinophils Absolute: 0.1 10*3/uL (ref 0.0–0.5)
Eosinophils Relative: 2 %
HCT: 29.5 % — ABNORMAL LOW (ref 39.0–52.0)
Hemoglobin: 9.7 g/dL — ABNORMAL LOW (ref 13.0–17.0)
Immature Granulocytes: 0 %
Lymphocytes Relative: 20 %
Lymphs Abs: 1.2 10*3/uL (ref 0.7–4.0)
MCH: 33.7 pg (ref 26.0–34.0)
MCHC: 32.9 g/dL (ref 30.0–36.0)
MCV: 102.4 fL — ABNORMAL HIGH (ref 80.0–100.0)
Monocytes Absolute: 0.6 10*3/uL (ref 0.1–1.0)
Monocytes Relative: 10 %
Neutro Abs: 3.9 10*3/uL (ref 1.7–7.7)
Neutrophils Relative %: 67 %
Platelets: 311 10*3/uL (ref 150–400)
RBC: 2.88 MIL/uL — ABNORMAL LOW (ref 4.22–5.81)
RDW: 22.4 % — ABNORMAL HIGH (ref 11.5–15.5)
WBC: 5.8 10*3/uL (ref 4.0–10.5)
nRBC: 0.3 % — ABNORMAL HIGH (ref 0.0–0.2)

## 2020-09-25 LAB — VITAMIN B12: Vitamin B-12: 603 pg/mL (ref 180–914)

## 2020-09-25 LAB — IRON AND TIBC
Iron: 114 ug/dL (ref 45–182)
Saturation Ratios: 46 % — ABNORMAL HIGH (ref 17.9–39.5)
TIBC: 251 ug/dL (ref 250–450)
UIBC: 137 ug/dL

## 2020-09-25 LAB — FERRITIN: Ferritin: 620 ng/mL — ABNORMAL HIGH (ref 24–336)

## 2020-09-25 LAB — FOLATE: Folate: 12.3 ng/mL (ref >5.9)

## 2020-09-25 MED ORDER — EPOETIN ALFA-EPBX 10000 UNIT/ML IJ SOLN
30000.0000 [IU] | INTRAMUSCULAR | Status: AC
  Administered 2020-09-25: 30000 [IU] via SUBCUTANEOUS

## 2020-09-26 DIAGNOSIS — Z4789 Encounter for other orthopedic aftercare: Secondary | ICD-10-CM | POA: Diagnosis not present

## 2020-09-26 DIAGNOSIS — M25611 Stiffness of right shoulder, not elsewhere classified: Secondary | ICD-10-CM | POA: Diagnosis not present

## 2020-09-26 DIAGNOSIS — M7581 Other shoulder lesions, right shoulder: Secondary | ICD-10-CM | POA: Diagnosis not present

## 2020-09-26 DIAGNOSIS — M25511 Pain in right shoulder: Secondary | ICD-10-CM | POA: Diagnosis not present

## 2020-10-02 ENCOUNTER — Inpatient Hospital Stay: Payer: Medicare Other | Attending: Oncology

## 2020-10-02 ENCOUNTER — Inpatient Hospital Stay: Payer: Medicare Other

## 2020-10-02 ENCOUNTER — Other Ambulatory Visit: Payer: Self-pay

## 2020-10-02 VITALS — BP 157/74 | HR 73 | Temp 98.0°F | Resp 18

## 2020-10-02 DIAGNOSIS — D461 Refractory anemia with ring sideroblasts: Secondary | ICD-10-CM | POA: Insufficient documentation

## 2020-10-02 LAB — HEMOGLOBIN AND HEMATOCRIT, BLOOD
HCT: 28.4 % — ABNORMAL LOW (ref 39.0–52.0)
Hemoglobin: 9.3 g/dL — ABNORMAL LOW (ref 13.0–17.0)

## 2020-10-02 MED ORDER — EPOETIN ALFA-EPBX 10000 UNIT/ML IJ SOLN
30000.0000 [IU] | INTRAMUSCULAR | Status: DC
  Administered 2020-10-02: 30000 [IU] via SUBCUTANEOUS

## 2020-10-03 DIAGNOSIS — M7581 Other shoulder lesions, right shoulder: Secondary | ICD-10-CM | POA: Diagnosis not present

## 2020-10-03 DIAGNOSIS — M25611 Stiffness of right shoulder, not elsewhere classified: Secondary | ICD-10-CM | POA: Diagnosis not present

## 2020-10-03 DIAGNOSIS — Z4789 Encounter for other orthopedic aftercare: Secondary | ICD-10-CM | POA: Diagnosis not present

## 2020-10-03 DIAGNOSIS — M25511 Pain in right shoulder: Secondary | ICD-10-CM | POA: Diagnosis not present

## 2020-10-08 DIAGNOSIS — M7581 Other shoulder lesions, right shoulder: Secondary | ICD-10-CM | POA: Diagnosis not present

## 2020-10-08 DIAGNOSIS — Z4789 Encounter for other orthopedic aftercare: Secondary | ICD-10-CM | POA: Diagnosis not present

## 2020-10-08 DIAGNOSIS — M25511 Pain in right shoulder: Secondary | ICD-10-CM | POA: Diagnosis not present

## 2020-10-08 DIAGNOSIS — M25611 Stiffness of right shoulder, not elsewhere classified: Secondary | ICD-10-CM | POA: Diagnosis not present

## 2020-10-09 ENCOUNTER — Inpatient Hospital Stay: Payer: Medicare Other

## 2020-10-09 ENCOUNTER — Other Ambulatory Visit: Payer: Self-pay

## 2020-10-09 VITALS — BP 128/86 | HR 64 | Temp 97.0°F | Resp 18

## 2020-10-09 DIAGNOSIS — D461 Refractory anemia with ring sideroblasts: Secondary | ICD-10-CM

## 2020-10-09 LAB — HEMOGLOBIN AND HEMATOCRIT, BLOOD
HCT: 30.7 % — ABNORMAL LOW (ref 39.0–52.0)
Hemoglobin: 10 g/dL — ABNORMAL LOW (ref 13.0–17.0)

## 2020-10-09 MED ORDER — EPOETIN ALFA-EPBX 10000 UNIT/ML IJ SOLN
30000.0000 [IU] | INTRAMUSCULAR | Status: DC
  Administered 2020-10-09: 30000 [IU] via SUBCUTANEOUS

## 2020-10-10 DIAGNOSIS — M7581 Other shoulder lesions, right shoulder: Secondary | ICD-10-CM | POA: Diagnosis not present

## 2020-10-10 DIAGNOSIS — M25511 Pain in right shoulder: Secondary | ICD-10-CM | POA: Diagnosis not present

## 2020-10-10 DIAGNOSIS — M25611 Stiffness of right shoulder, not elsewhere classified: Secondary | ICD-10-CM | POA: Diagnosis not present

## 2020-10-10 DIAGNOSIS — Z4789 Encounter for other orthopedic aftercare: Secondary | ICD-10-CM | POA: Diagnosis not present

## 2020-10-15 DIAGNOSIS — M7581 Other shoulder lesions, right shoulder: Secondary | ICD-10-CM | POA: Diagnosis not present

## 2020-10-15 DIAGNOSIS — M25511 Pain in right shoulder: Secondary | ICD-10-CM | POA: Diagnosis not present

## 2020-10-15 DIAGNOSIS — Z4789 Encounter for other orthopedic aftercare: Secondary | ICD-10-CM | POA: Diagnosis not present

## 2020-10-15 DIAGNOSIS — M25611 Stiffness of right shoulder, not elsewhere classified: Secondary | ICD-10-CM | POA: Diagnosis not present

## 2020-10-16 ENCOUNTER — Inpatient Hospital Stay: Payer: Medicare Other

## 2020-10-16 ENCOUNTER — Other Ambulatory Visit: Payer: Self-pay

## 2020-10-16 VITALS — BP 128/69 | HR 48 | Temp 97.5°F | Resp 18

## 2020-10-16 DIAGNOSIS — D461 Refractory anemia with ring sideroblasts: Secondary | ICD-10-CM | POA: Diagnosis not present

## 2020-10-16 LAB — HEMOGLOBIN AND HEMATOCRIT, BLOOD
HCT: 30 % — ABNORMAL LOW (ref 39.0–52.0)
Hemoglobin: 9.8 g/dL — ABNORMAL LOW (ref 13.0–17.0)

## 2020-10-16 MED ORDER — EPOETIN ALFA-EPBX 10000 UNIT/ML IJ SOLN
30000.0000 [IU] | INTRAMUSCULAR | Status: DC
  Administered 2020-10-16: 30000 [IU] via SUBCUTANEOUS

## 2020-10-17 ENCOUNTER — Ambulatory Visit
Admission: RE | Admit: 2020-10-17 | Discharge: 2020-10-17 | Disposition: A | Discharge status: Home / Self Care | Payer: Medicare Other | Source: Ambulatory Visit | Attending: Urology | Admitting: Urology

## 2020-10-17 DIAGNOSIS — K862 Cyst of pancreas: Secondary | ICD-10-CM | POA: Diagnosis not present

## 2020-10-17 DIAGNOSIS — Z4789 Encounter for other orthopedic aftercare: Secondary | ICD-10-CM | POA: Diagnosis not present

## 2020-10-17 DIAGNOSIS — N281 Cyst of kidney, acquired: Secondary | ICD-10-CM | POA: Diagnosis not present

## 2020-10-17 DIAGNOSIS — N2889 Other specified disorders of kidney and ureter: Secondary | ICD-10-CM | POA: Diagnosis not present

## 2020-10-17 DIAGNOSIS — K449 Diaphragmatic hernia without obstruction or gangrene: Secondary | ICD-10-CM | POA: Diagnosis not present

## 2020-10-17 DIAGNOSIS — M25511 Pain in right shoulder: Secondary | ICD-10-CM | POA: Diagnosis not present

## 2020-10-17 DIAGNOSIS — M7581 Other shoulder lesions, right shoulder: Secondary | ICD-10-CM | POA: Diagnosis not present

## 2020-10-17 DIAGNOSIS — M25611 Stiffness of right shoulder, not elsewhere classified: Secondary | ICD-10-CM | POA: Diagnosis not present

## 2020-10-17 IMAGING — MR MR ABDOMEN WO/W CM
16 of 17 series · 42 of 48 positions shown · IV contrast (gadavist)
Comparison: [DATE]

CLINICAL DATA: Follow-up of left renal mass.

EXAM:
MRI ABDOMEN WITHOUT AND WITH CONTRAST
TECHNIQUE: Multiplanar multisequence MR imaging of the abdomen was performed
both before and after the administration of intravenous contrast.
CONTRAST:  10mL GADAVIST GADOBUTROL 1 MMOL/ML IV SOLN

[Series 3: cor ssfse / · coronal · 7.0mm · 1.48mm/px · 1 of 40 slices shown]
[im 1/40]
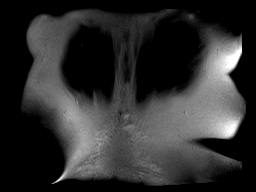

[Series 4: T2 fat-sat · axial · 6.0mm · 1.48mm/px · 1 of 37 slices shown]
[im 1/37]
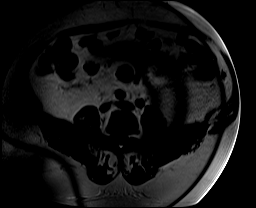

[Series 5: T1 · axial · 6.0mm · 0.74mm/px · z∈[-150,+109]mm · 2 of 74 slices shown]
[im 1/74]
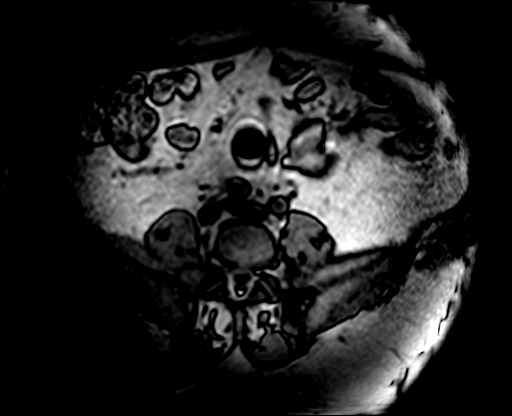
[im 74/74]
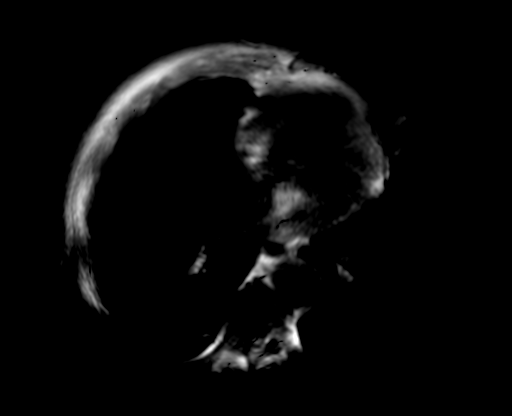

[Series 7: DWI · axial · 6.0mm · 2.00mm/px · z∈[-107,+137]mm · 5 of 105 slices shown (1 of 2)]
[im 1/105]
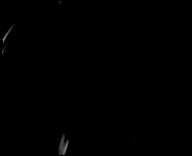
[im 27/105]
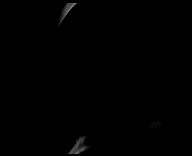
[im 53/105]
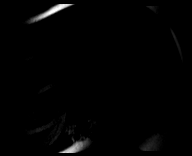
[im 79/105]
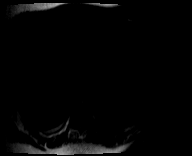
[im 105/105]
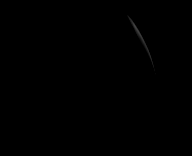

[Series 8: DWI · axial · 6.0mm · 2.00mm/px · z∈[-107,+137]mm · 2 of 35 slices shown (2 of 2)]
[im 1/35]
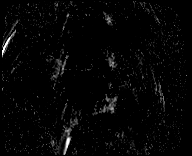
[im 35/35]
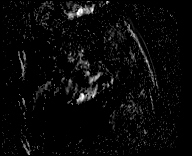

[Series 9: bSSFP · axial · 6.0mm · 0.74mm/px · z∈[-150,+109]mm · 2 of 37 slices shown]
[im 1/37]
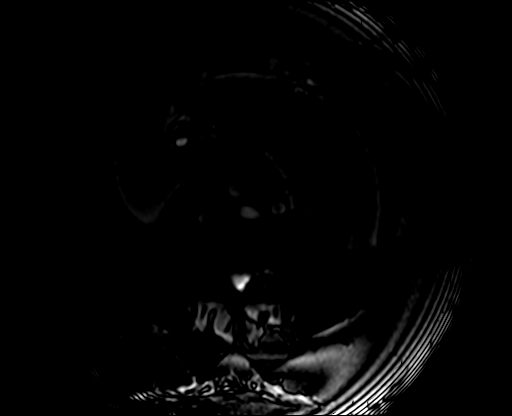
[im 37/37]
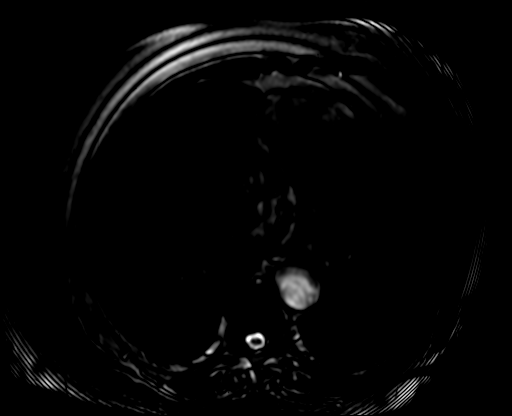

[Series 10: axial ssfse / · axial · 6.0mm · 1.19mm/px · z∈[-150,+109]mm · 2 of 37 slices shown]
[im 1/37]
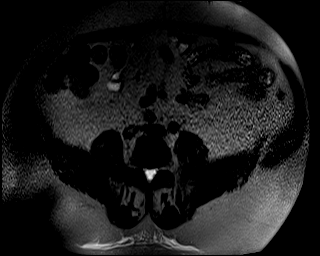
[im 37/37]
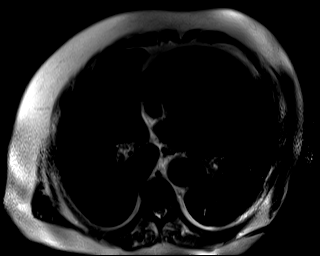

[Series 11: axial dynamic pre · axial · non-contrast · 4.0mm · 1.25mm/px · z∈[-129,+122]mm · 3 of 64 slices shown]
[im 1/64]
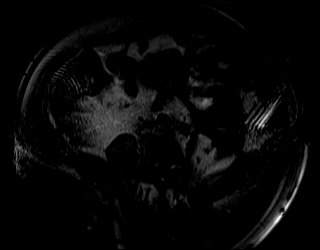
[im 32/64]
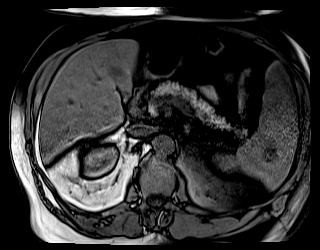
[im 64/64]
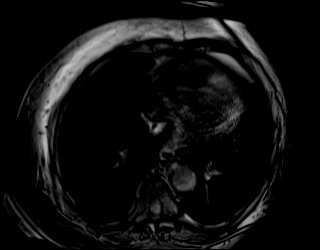

[Series 12: axial dynamic post · axial · 4.0mm · 1.25mm/px · z∈[-129,+122]mm · 3 of 64 slices shown (1 of 6)]
[im 1/64]
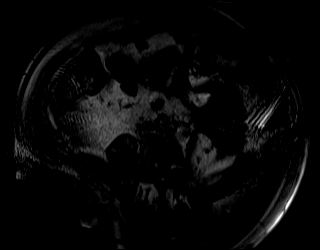
[im 32/64]
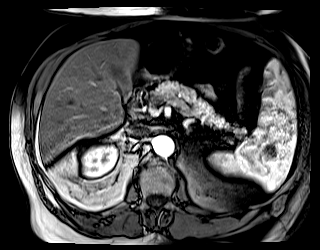
[im 64/64]
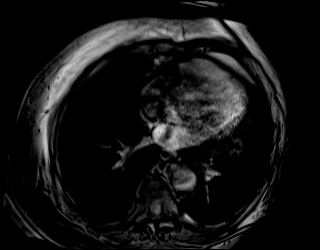

[Series 13: axial dynamic post · axial · 4.0mm · 1.25mm/px · z∈[-129,+122]mm · 3 of 64 slices shown (2 of 6)]
[im 1/64]
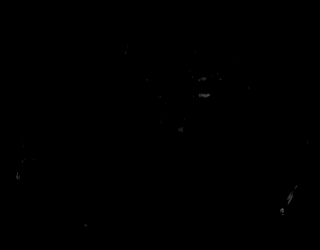
[im 32/64]
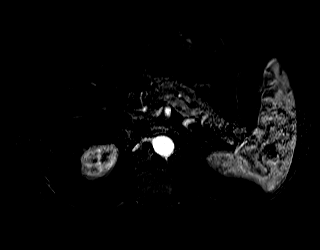
[im 64/64]
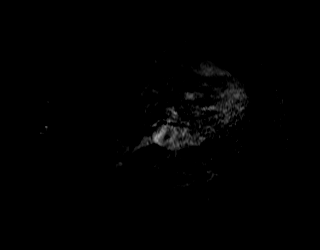

[Series 14: axial dynamic post · axial · 4.0mm · 1.25mm/px · z∈[-129,+122]mm · 3 of 64 slices shown (3 of 6)]
[im 1/64]
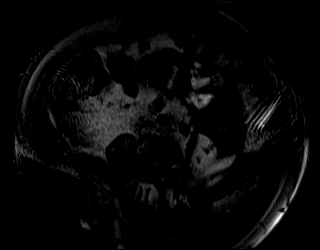
[im 32/64]
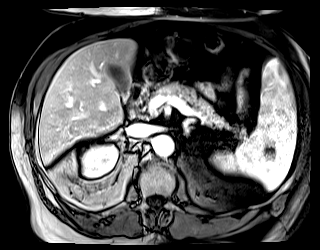
[im 64/64]
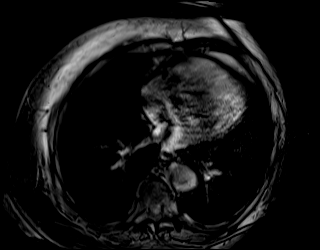

[Series 15: axial dynamic post · axial · 4.0mm · 1.25mm/px · z∈[-129,+122]mm · 3 of 64 slices shown (4 of 6)]
[im 1/64]
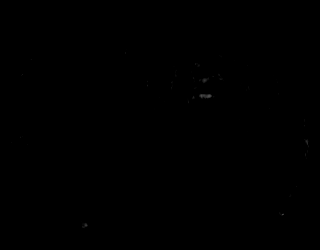
[im 32/64]
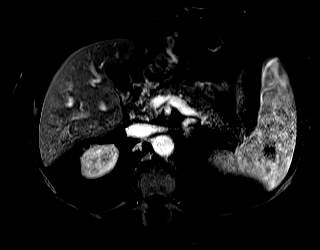
[im 64/64]
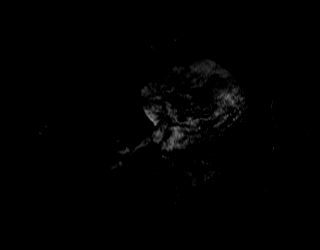

[Series 16: axial dynamic post · axial · 4.0mm · 1.25mm/px · z∈[-129,+122]mm · 3 of 64 slices shown (5 of 6)]
[im 1/64]
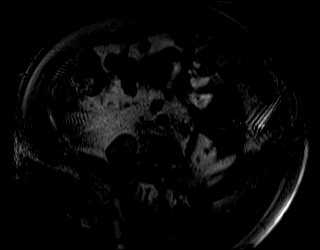
[im 32/64]
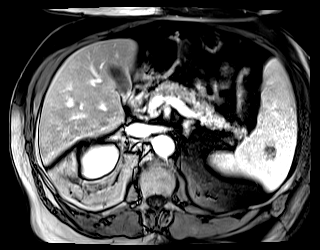
[im 64/64]
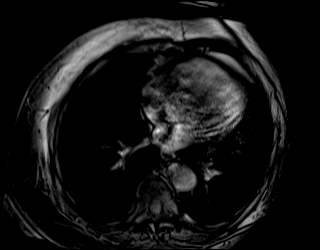

[Series 17: axial dynamic post · axial · 4.0mm · 1.25mm/px · z∈[-129,+122]mm · 3 of 64 slices shown (6 of 6)]
[im 1/64]
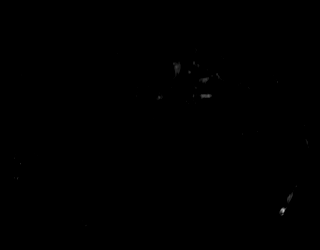
[im 32/64]
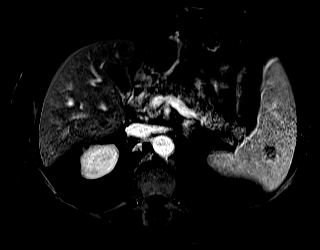
[im 64/64]
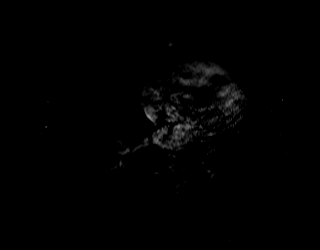

[Series 19: axial dynamic 3 · axial · 4.0mm · 1.25mm/px · z∈[-129,+122]mm · 3 of 64 slices shown (1 of 2)]
[im 1/64]
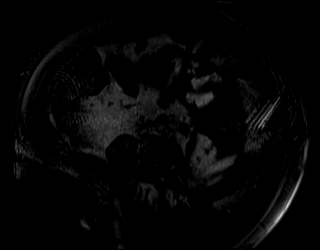
[im 32/64]
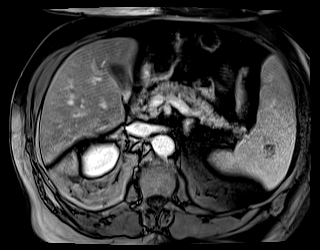
[im 64/64]
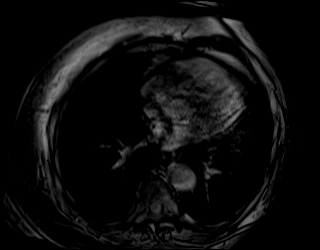

[Series 20: axial dynamic 3 · axial · 4.0mm · 1.25mm/px · z∈[-129,+122]mm · 3 of 64 slices shown (2 of 2)]
[im 1/64]
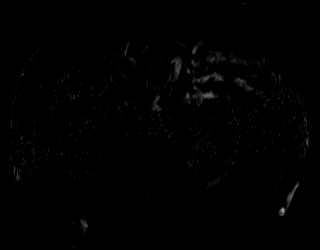
[im 32/64]
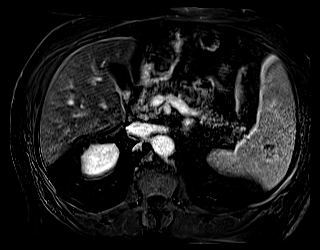
[im 64/64]
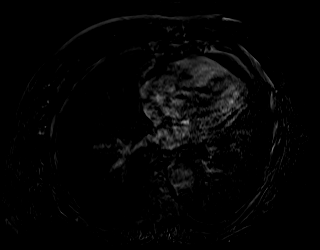

[42 of 48 positions shown; findings below may reference images not displayed]

FINDINGS: Lower chest: Normal heart size without pericardial or pleural
effusion. Tiny hiatal hernia.

Hepatobiliary: Iron deposition within the liver. No focal liver
lesion. Normal gallbladder, without biliary ductal dilatation.

Pancreas: Cystic lesion within the pancreatic head measures 8 mm on
[DATE], possibly more distinct than on the prior.

Spleen: Mildly T2 hyperintense lesion with delayed post-contrast
fill, again favoring a hemangioma. Example 2.5 cm on [DATE], similar.
No splenomegaly.

Adrenals/Urinary Tract: Normal adrenal glands. Normal right kidney.
Again identified is an exophytic lower pole left renal 1.9 cm lesion
with precontrast T1 hyperintensity on 54/11. No post-contrast
enhancement.

The lesion of interest, within the upper/interpolar left kidney is
most apparent on image 42/14. Measures 1.7 cm today versus 1.6 cm on
the prior exam (when remeasured). Again demonstrates heterogeneous
post-contrast enhancement and heterogeneous precontrast T2 signal.

No hydronephrosis.

Stomach/Bowel: Normal remainder of the stomach and abdominal bowel
loops.

Vascular/Lymphatic: Suspect mild celiac narrowing including on
[DATE]. Aortic atherosclerosis. Patent renal veins.

No retroperitoneal or retrocrural adenopathy.

Other:  No ascites.

Musculoskeletal: No acute osseous abnormality.
IMPRESSION: 1. Similar to minimal enlargement of a 1.7 cm left renal lesion,
which remains suspicious for renal cell carcinoma.
2. Bosniak 2 left renal hemorrhagic/proteinaceous cyst, as before.
3. Pancreatic head cystic lesion of 8 mm, possibly more distinct
than on the prior exam. Most likely a pseudocyst versus less likely
indolent cystic neoplasm. This warrants follow-up with pre and post
contrast abdominal MRI/MRCP at 2 years. This recommendation follows
ACR consensus guidelines: Management of Incidental Pancreatic Cysts:
A White Paper of the ACR Incidental Findings Committee. [HOSPITAL] [1T];[DATE].
4. Splenic lesion which is similar in size and likely a hemangioma.
5.  Tiny hiatal hernia.
6. Iron deposition in the liver, suggesting hemosiderosis.

## 2020-10-17 MED ORDER — GADOBUTROL 1 MMOL/ML IV SOLN
10.0000 mL | Freq: Once | INTRAVENOUS | Status: AC | PRN
  Administered 2020-10-17: 10 mL via INTRAVENOUS

## 2020-10-22 DIAGNOSIS — Z4789 Encounter for other orthopedic aftercare: Secondary | ICD-10-CM | POA: Diagnosis not present

## 2020-10-22 DIAGNOSIS — M25611 Stiffness of right shoulder, not elsewhere classified: Secondary | ICD-10-CM | POA: Diagnosis not present

## 2020-10-22 DIAGNOSIS — M7581 Other shoulder lesions, right shoulder: Secondary | ICD-10-CM | POA: Diagnosis not present

## 2020-10-22 DIAGNOSIS — M25511 Pain in right shoulder: Secondary | ICD-10-CM | POA: Diagnosis not present

## 2020-10-23 ENCOUNTER — Inpatient Hospital Stay: Payer: Medicare Other

## 2020-10-23 ENCOUNTER — Other Ambulatory Visit: Payer: Self-pay

## 2020-10-23 VITALS — BP 128/83 | HR 79 | Temp 97.0°F | Resp 18

## 2020-10-23 DIAGNOSIS — D461 Refractory anemia with ring sideroblasts: Secondary | ICD-10-CM

## 2020-10-23 LAB — HEMOGLOBIN AND HEMATOCRIT, BLOOD
HCT: 30.3 % — ABNORMAL LOW (ref 39.0–52.0)
Hemoglobin: 10 g/dL — ABNORMAL LOW (ref 13.0–17.0)

## 2020-10-23 MED ORDER — EPOETIN ALFA-EPBX 10000 UNIT/ML IJ SOLN
30000.0000 [IU] | INTRAMUSCULAR | Status: DC
  Administered 2020-10-23: 30000 [IU] via SUBCUTANEOUS

## 2020-10-24 DIAGNOSIS — M25511 Pain in right shoulder: Secondary | ICD-10-CM | POA: Diagnosis not present

## 2020-10-24 DIAGNOSIS — M7581 Other shoulder lesions, right shoulder: Secondary | ICD-10-CM | POA: Diagnosis not present

## 2020-10-24 DIAGNOSIS — M25611 Stiffness of right shoulder, not elsewhere classified: Secondary | ICD-10-CM | POA: Diagnosis not present

## 2020-10-24 DIAGNOSIS — Z4789 Encounter for other orthopedic aftercare: Secondary | ICD-10-CM | POA: Diagnosis not present

## 2020-10-25 ENCOUNTER — Ambulatory Visit: Payer: Medicare Other

## 2020-10-29 ENCOUNTER — Other Ambulatory Visit: Payer: Self-pay | Admitting: *Deleted

## 2020-10-29 DIAGNOSIS — D539 Nutritional anemia, unspecified: Secondary | ICD-10-CM

## 2020-10-29 DIAGNOSIS — D461 Refractory anemia with ring sideroblasts: Secondary | ICD-10-CM

## 2020-10-30 ENCOUNTER — Other Ambulatory Visit: Payer: Medicare Other

## 2020-10-30 ENCOUNTER — Ambulatory Visit: Payer: Medicare Other

## 2020-10-30 ENCOUNTER — Inpatient Hospital Stay: Payer: Medicare Other | Attending: Nurse Practitioner

## 2020-10-30 ENCOUNTER — Ambulatory Visit: Payer: Medicare Other | Admitting: Oncology

## 2020-10-30 ENCOUNTER — Other Ambulatory Visit: Payer: Self-pay

## 2020-10-30 ENCOUNTER — Encounter: Payer: Self-pay | Admitting: Oncology

## 2020-10-30 ENCOUNTER — Inpatient Hospital Stay (HOSPITAL_BASED_OUTPATIENT_CLINIC_OR_DEPARTMENT_OTHER): Payer: Medicare Other | Admitting: Oncology

## 2020-10-30 ENCOUNTER — Inpatient Hospital Stay: Payer: Medicare Other

## 2020-10-30 VITALS — BP 130/81 | HR 65 | Temp 98.1°F | Resp 16 | Ht 70.0 in | Wt 251.0 lb

## 2020-10-30 DIAGNOSIS — D461 Refractory anemia with ring sideroblasts: Secondary | ICD-10-CM | POA: Insufficient documentation

## 2020-10-30 DIAGNOSIS — D539 Nutritional anemia, unspecified: Secondary | ICD-10-CM | POA: Diagnosis not present

## 2020-10-30 LAB — CBC WITH DIFFERENTIAL/PLATELET
Abs Immature Granulocytes: 0.03 10*3/uL (ref 0.00–0.07)
Basophils Absolute: 0 10*3/uL (ref 0.0–0.1)
Basophils Relative: 1 %
Eosinophils Absolute: 0.1 10*3/uL (ref 0.0–0.5)
Eosinophils Relative: 1 %
HCT: 28.8 % — ABNORMAL LOW (ref 39.0–52.0)
Hemoglobin: 9.3 g/dL — ABNORMAL LOW (ref 13.0–17.0)
Immature Granulocytes: 0 %
Lymphocytes Relative: 22 %
Lymphs Abs: 1.5 10*3/uL (ref 0.7–4.0)
MCH: 34.3 pg — ABNORMAL HIGH (ref 26.0–34.0)
MCHC: 32.3 g/dL (ref 30.0–36.0)
MCV: 106.3 fL — ABNORMAL HIGH (ref 80.0–100.0)
Monocytes Absolute: 0.6 10*3/uL (ref 0.1–1.0)
Monocytes Relative: 8 %
Neutro Abs: 4.6 10*3/uL (ref 1.7–7.7)
Neutrophils Relative %: 68 %
Platelets: 296 10*3/uL (ref 150–400)
RBC: 2.71 MIL/uL — ABNORMAL LOW (ref 4.22–5.81)
RDW: 20.5 % — ABNORMAL HIGH (ref 11.5–15.5)
WBC: 6.7 10*3/uL (ref 4.0–10.5)
nRBC: 0.4 % — ABNORMAL HIGH (ref 0.0–0.2)

## 2020-10-30 LAB — FERRITIN: Ferritin: 647 ng/mL — ABNORMAL HIGH (ref 24–336)

## 2020-10-30 LAB — IRON AND TIBC
Iron: 92 ug/dL (ref 45–182)
Saturation Ratios: 40 % — ABNORMAL HIGH (ref 17.9–39.5)
TIBC: 231 ug/dL — ABNORMAL LOW (ref 250–450)
UIBC: 139 ug/dL

## 2020-10-30 LAB — VITAMIN B12: Vitamin B-12: 688 pg/mL (ref 180–914)

## 2020-10-30 LAB — FOLATE: Folate: 10.9 ng/mL (ref >5.9)

## 2020-10-30 MED ORDER — EPOETIN ALFA-EPBX 10000 UNIT/ML IJ SOLN
30000.0000 [IU] | INTRAMUSCULAR | Status: AC
  Administered 2020-10-30: 30000 [IU] via SUBCUTANEOUS
  Filled 2020-10-30: qty 3

## 2020-10-30 NOTE — Progress Notes (Signed)
Patient does not feel any different with the shots whether he is at 10 or 9.3. no difference in his fatigue or not with shots

## 2020-11-05 ENCOUNTER — Ambulatory Visit: Payer: Medicare Other | Admitting: Urology

## 2020-11-05 ENCOUNTER — Other Ambulatory Visit: Payer: Self-pay

## 2020-11-05 ENCOUNTER — Encounter: Payer: Self-pay | Admitting: Urology

## 2020-11-05 VITALS — BP 130/71 | HR 79 | Ht 70.0 in | Wt 251.0 lb

## 2020-11-05 DIAGNOSIS — N432 Other hydrocele: Secondary | ICD-10-CM | POA: Diagnosis not present

## 2020-11-05 DIAGNOSIS — M25511 Pain in right shoulder: Secondary | ICD-10-CM | POA: Diagnosis not present

## 2020-11-05 DIAGNOSIS — Z4789 Encounter for other orthopedic aftercare: Secondary | ICD-10-CM | POA: Diagnosis not present

## 2020-11-05 DIAGNOSIS — N2889 Other specified disorders of kidney and ureter: Secondary | ICD-10-CM

## 2020-11-05 DIAGNOSIS — M25611 Stiffness of right shoulder, not elsewhere classified: Secondary | ICD-10-CM | POA: Diagnosis not present

## 2020-11-05 DIAGNOSIS — N529 Male erectile dysfunction, unspecified: Secondary | ICD-10-CM | POA: Diagnosis not present

## 2020-11-05 DIAGNOSIS — M7581 Other shoulder lesions, right shoulder: Secondary | ICD-10-CM | POA: Diagnosis not present

## 2020-11-05 NOTE — Progress Notes (Signed)
   11/05/2020 10:29 AM   Brett Boone 01/29/1945 [id number]  Reason for visit: Follow up left small renal mass, right hydrocele, ED  HPI: 75 year old male I have been following for the above issues.  He has a ~1.6 left upper pole subtly enhancing endophytic renal mass that is under active surveillance.  He was seen by Dr. Kathlene Cote in interventional radiology, and he felt this would be very challenging to ablate percutaneously and recommended active surveillance as well.  I personally viewed and interpreted his most recent MRI that shows minimal change in a 1.7 cm left upper pole renal endophytic lesion, as well as a stable 8 mm pancreatic head cystic lesion and repeat MRI was recommended in 2 years regarding the pancreatic lesion.  We again reviewed options including attempted percutaneous ablation, partial nephrectomy, or active surveillance.  We reviewed the AUA guidelines regarding renal masses, and I recommended continuing active surveillance with his stable lesion, with repeat MRI in 9 months.  If MRI is stable at that time can consider spacing to yearly images.  Regarding his erectile dysfunction, he is getting good results with Cialis 5 mg on demand.  He has enough refills for the next year.  He denies any changes in the right hydrocele, and we again reviewed options including observation or hydrocelectomy, and he would like to continue observation at this time.  RTC 9 months with MRI prior for left small renal mass Continue Cialis for ED  Billey Co, MD  [facility] [address], [place], [place] [address] [phone]

## 2020-11-06 ENCOUNTER — Inpatient Hospital Stay: Payer: Medicare Other

## 2020-11-06 VITALS — BP 122/75

## 2020-11-06 DIAGNOSIS — D461 Refractory anemia with ring sideroblasts: Secondary | ICD-10-CM | POA: Diagnosis not present

## 2020-11-06 DIAGNOSIS — Z9889 Other specified postprocedural states: Secondary | ICD-10-CM | POA: Diagnosis not present

## 2020-11-06 DIAGNOSIS — W1849XD Other slipping, tripping and stumbling without falling, subsequent encounter: Secondary | ICD-10-CM | POA: Diagnosis not present

## 2020-11-06 DIAGNOSIS — S46011D Strain of muscle(s) and tendon(s) of the rotator cuff of right shoulder, subsequent encounter: Secondary | ICD-10-CM | POA: Diagnosis not present

## 2020-11-06 LAB — HEMOGLOBIN AND HEMATOCRIT, BLOOD
HCT: 30.6 % — ABNORMAL LOW (ref 39.0–52.0)
Hemoglobin: 10 g/dL — ABNORMAL LOW (ref 13.0–17.0)

## 2020-11-06 MED ORDER — EPOETIN ALFA-EPBX 10000 UNIT/ML IJ SOLN
30000.0000 [IU] | INTRAMUSCULAR | Status: DC
  Administered 2020-11-06: 30000 [IU] via SUBCUTANEOUS

## 2020-11-10 ENCOUNTER — Encounter: Payer: Self-pay | Admitting: Hematology and Oncology

## 2020-11-10 NOTE — Progress Notes (Signed)
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 Bone marrow biopsy on 09/11/2019 revealed macrocytic anemia and hypercellular bone marrow for age with dyspoietic changes primarily involving the erythroid cell lines associated with abundant ring sideroblasts (> 15%).  There was no increase in blasts. Flow cytometry was negative. Cytogenetics revealed 45,X,-Y [20].  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 IPSS score is low.   Baseline EPO level was 63.3 in August 2021.  Patient received Aranesp for a month in September 2021 and was then switched to [place].  He is presently on weekly Retacrit 20,000 units.   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   MRI abdomen in September 2021 showed 1.3 cm lesion in the left kidney concerning for renal cell carcinoma for which he follows up with urology    Interval history- He reports feeling well overall and denies any specific complaints at this time.  He is tolerating Retacrit well.  He has baseline fatigue which is remained stable overall.  ECOG PS- 1 Pain scale- 0   Review of systems- Review of Systems  Constitutional:  Negative for chills, fever, malaise/fatigue and weight loss.  HENT:  Negative for congestion, ear discharge and  nosebleeds.   Eyes:  Negative for blurred vision.  Respiratory:  Negative for cough, hemoptysis, sputum production, shortness of breath and wheezing.   Cardiovascular:  Negative for chest pain, palpitations, orthopnea and claudication.  Gastrointestinal:  Negative for abdominal pain, blood in stool, constipation, diarrhea, heartburn, melena, nausea and vomiting.  Genitourinary:  Negative for dysuria, flank pain, frequency, hematuria and urgency.  Musculoskeletal:  Negative for back pain, joint pain and myalgias.  Skin:  Negative for rash.  Neurological:  Negative for dizziness, tingling, focal weakness, seizures, weakness and headaches.  Endo/Heme/Allergies:  Does not bruise/bleed easily.  Psychiatric/Behavioral:  Negative for depression and suicidal ideas. The patient does not have insomnia.       Allergies  Allergen Reactions   Amoxicillin-Pot Clavulanate Hives and Nausea And Vomiting     Past Medical History:  Diagnosis Date   Anemia    Heart murmur    Hydrocele    Rotator cuff tear arthropathy of right shoulder 08/01/2020   Venous stasis dermatitis of both lower extremities      Past Surgical History:  Procedure Laterality Date   CATARACT EXTRACTION W/PHACO Right 03/09/2017   Procedure: CATARACT EXTRACTION PHACO AND INTRAOCULAR LENS PLACEMENT ([place]) RIGHT;  Surgeon: Eulogio Bear, MD;  Location: [place];  Service: Ophthalmology;  Laterality: Right;   COLONOSCOPY  2013   normal- cleared for 5 yrs- Dr Beckey Downing   COLONOSCOPY WITH PROPOFOL N/A 02/17/2017   Procedure: COLONOSCOPY WITH PROPOFOL;  Surgeon: Lin Landsman, MD;  Location: [place];  Service: Endoscopy;  Laterality: N/A;   COLONOSCOPY WITH PROPOFOL N/A 10/12/2019   Procedure: COLONOSCOPY  WITH BIOPSY;  Surgeon: Lin Landsman, MD;  Location: [place];  Service: Endoscopy;  Laterality: N/A;   EPIGASTRIC HERNIA REPAIR N/A 07/06/2014   Procedure: HERNIA REPAIR EPIGASTRIC  ADULT;  Surgeon: Molly Maduro, MD;  Location: [facility];  Service: General;  Laterality: N/A;   HERNIA REPAIR Bilateral    INSERTION OF MESH N/A 07/06/2014   Procedure: INSERTION OF MESH;  Surgeon: Molly Maduro, MD;  Location: [facility];  Service: General;  Laterality: N/A;   IR RADIOLOGIST EVAL & MGMT  11/07/2019   IR RADIOLOGIST EVAL & MGMT  04/25/2020   POLYPECTOMY  02/17/2017   Procedure: POLYPECTOMY;  Surgeon: Lin Landsman, MD;  Location: [place];  Service: Endoscopy;;   POLYPECTOMY N/A 10/12/2019   Procedure: POLYPECTOMY;  Surgeon: Lin Landsman, MD;  Location: [place];  Service: Endoscopy;  Laterality: N/A;   SHOULDER ARTHROSCOPY Left 2008   SINUS EXPLORATION      Social History   Socioeconomic History   Marital status: Married    Spouse name: Not on file   Number of children: 3   Years of education: Not on file   Highest education level: Bachelor's degree (e.g., BA, AB, BS)  Occupational History   Occupation: Retired  Tobacco Use   Smoking status: Former    Packs/day: 0.25    Years: 50.00    Pack years: 12.50    Types: Cigarettes    Quit date: 05/03/2018    Years since quitting: 2.5   Smokeless tobacco: Former   Tobacco comments:    pt quit smoking  Vaping Use   Vaping Use: Never used  Substance and Sexual Activity   Alcohol use: Yes    Comment: Occasional -1x/month   Drug use: No   Sexual activity: Yes  Other Topics Concern   Not on file  Social History Narrative   Not on file   Social Determinants of Health   Financial Resource Strain: Low Risk    Difficulty of Paying Living Expenses: Not hard at all  Food Insecurity: No Food Insecurity   Worried About Charity fundraiser in the Last Year: Never true   [place] in the Last Year: Never true  Transportation Needs: No Transportation Needs   Lack of Transportation (Medical): No   Lack of Transportation (Non-Medical): No  Physical Activity: Inactive   Days of  Exercise per Week: 0 days   Minutes of Exercise per Session: 0 min  Stress: No Stress Concern Present   Feeling of Stress : Only a little  Social Connections: Moderately Integrated   Frequency of Communication with Friends and Family: More than three times a week   Frequency of Social Gatherings with Friends and Family: Once a week   Attends Religious Services: More than 4 times per year   Active Member of [employer] or Organizations: No   Attends Music therapist: Never   Marital Status: Married  Human resources officer Violence: Not At Risk   Fear of Current or Ex-Partner: No   Emotionally Abused: No   Physically Abused: No   Sexually Abused: No    Family History  Problem Relation Age of Onset   Diabetes Father    Heart disease Father 82   Stroke Mother      Current Outpatient Medications:    Calcium Carb-Cholecalciferol 600-400 MG-UNIT TABS, Take by mouth., Disp: , Rfl:    Epoetin Alfa-epbx (RETACRIT IJ), , Disp: , Rfl:    tadalafil (  CIALIS) 5 MG tablet, Take 1 tablet (5 mg total) by mouth daily., Disp: 30 tablet, Rfl: 11 No current facility-administered medications for this visit.  Facility-Administered Medications Ordered in Other Visits:    epoetin alfa-epbx (RETACRIT) injection 30,000 Units, 30,000 Units, Subcutaneous, Weekly, Sindy Guadeloupe, MD, 30,000 Units at 09/25/20 1113   epoetin alfa-epbx (RETACRIT) injection 30,000 Units, 30,000 Units, Subcutaneous, Weekly, Sindy Guadeloupe, MD, 30,000 Units at 10/30/20 1443  Physical exam:  Vitals:   10/30/20 1424  BP: 130/81  Pulse: 65  Resp: 16  Temp: 98.1 F (36.7 C)  TempSrc: Oral  Weight: 251 lb (113.9 kg)  Height: 5' 10"  (1.778 m)   Physical Exam Constitutional:      General: He is not in acute distress. Cardiovascular:     Rate and Rhythm: Normal rate and regular rhythm.     Heart sounds: Normal heart sounds.  Pulmonary:     Effort: Pulmonary effort is normal.     Breath sounds: Normal breath sounds.   Abdominal:     General: Bowel sounds are normal.     Palpations: Abdomen is soft.  Skin:    General: Skin is warm and dry.  Neurological:     Mental Status: He is alert and oriented to person, place, and time.     CMP Latest Ref Rng & Units 08/21/2020  Glucose 70 - 99 mg/dL 136(H)  BUN 8 - 23 mg/dL 14  Creatinine 0.61 - 1.24 mg/dL 1.01  Sodium 135 - 145 mmol/L 135  Potassium 3.5 - 5.1 mmol/L 4.2  Chloride 98 - 111 mmol/L 103  CO2 22 - 32 mmol/L 29  Calcium 8.9 - 10.3 mg/dL 8.8(L)  Total Protein 6.5 - 8.1 g/dL 6.8  Total Bilirubin 0.3 - 1.2 mg/dL 1.6(H)  Alkaline Phos 38 - 126 U/L 47  AST 15 - 41 U/L 15  ALT 0 - 44 U/L 11   CBC Latest Ref Rng & Units 11/06/2020  WBC 4.0 - 10.5 K/uL -  Hemoglobin 13.0 - 17.0 g/dL 10.0(L)  Hematocrit 39.0 - 52.0 % 30.6(L)  Platelets 150 - 400 K/uL -    No images are attached to the encounter.  MR Abdomen W Wo Contrast  Result Date: 10/18/2020 CLINICAL DATA:  Follow-up of left renal mass. EXAM: MRI ABDOMEN WITHOUT AND WITH CONTRAST TECHNIQUE: Multiplanar multisequence MR imaging of the abdomen was performed both before and after the administration of intravenous contrast. CONTRAST:  2m GADAVIST GADOBUTROL 1 MMOL/ML IV SOLN COMPARISON:  04/12/2020 FINDINGS: Lower chest: Normal heart size without pericardial or pleural effusion. Tiny hiatal hernia. Hepatobiliary: Iron deposition within the liver. No focal liver lesion. Normal gallbladder, without biliary ductal dilatation. Pancreas: Cystic lesion within the pancreatic head measures 8 mm on 22/10, possibly more distinct than on the prior. Spleen: Mildly T2 hyperintense lesion with delayed post-contrast fill, again favoring a hemangioma. Example 2.5 cm on 21/4, similar. No splenomegaly. Adrenals/Urinary Tract: Normal adrenal glands. Normal right kidney. Again identified is an exophytic lower pole left renal 1.9 cm lesion with precontrast T1 hyperintensity on 54/11. No post-contrast enhancement. The  lesion of interest, within the upper/interpolar left kidney is most apparent on image 42/14. Measures 1.7 cm today versus 1.6 cm on the prior exam (when remeasured). Again demonstrates heterogeneous post-contrast enhancement and heterogeneous precontrast T2 signal. No hydronephrosis. Stomach/Bowel: Normal remainder of the stomach and abdominal bowel loops. Vascular/Lymphatic: Suspect mild celiac narrowing including on 28/12. Aortic atherosclerosis. Patent renal veins. No retroperitoneal or retrocrural adenopathy. Other:  No ascites. Musculoskeletal: No acute osseous abnormality. IMPRESSION: 1. Similar to minimal enlargement of a 1.7 cm left renal lesion, which remains suspicious for renal cell carcinoma. 2. Bosniak 2 left renal hemorrhagic/proteinaceous cyst, as before. 3. Pancreatic head cystic lesion of 8 mm, possibly more distinct than on the prior exam. Most likely a pseudocyst versus less likely indolent cystic neoplasm. This warrants follow-up with pre and post contrast abdominal MRI/MRCP at 2 years. This recommendation follows ACR consensus guidelines: Management of Incidental Pancreatic Cysts: A White Paper of the ACR Incidental Findings Committee. [place] 9166;06:004-599. 4. 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 5.  Tiny hiatal hernia. 6. 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 Electronically Signed   By: Abigail Miyamoto M.D.   On: 10/18/2020 14:29     Assessment and plan- Patient is a 75 y.o. 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  He is here for routine follow-up of anemia and possible Retacrit  Patient's hemoglobin is currently stable between 9-9.5 and this is where he has been since June 2021.  My plan is to continue Retacrit every 3 weeks as long as we can keep his hemoglobin at this range.  However if his hemoglobin drops down to less than 9 towards the eights I would consider  Luspatercept for him.  Overall patient is a good quality of life and other than mild fatigue he denies any other complaints at this time continue H&H and Retacrit every 3 weeks and I will see him back in 3 months with CBC ferritin and iron studies   Visit Diagnosis 1. Refractory anemia with ring sideroblasts (HCC)   2. Macrocytic anemia      Dr. Randa Evens, MD, MPH [facility] at [facility] [phone] 11/10/2020 6:09 PM

## 2020-11-12 ENCOUNTER — Ambulatory Visit
Admission: EM | Admit: 2020-11-12 | Discharge: 2020-11-12 | Disposition: A | Discharge status: Home / Self Care | Payer: Medicare Other

## 2020-11-12 ENCOUNTER — Other Ambulatory Visit: Payer: Self-pay

## 2020-11-12 DIAGNOSIS — S0031XA Abrasion of nose, initial encounter: Secondary | ICD-10-CM

## 2020-11-12 DIAGNOSIS — T148XXA Other injury of unspecified body region, initial encounter: Secondary | ICD-10-CM

## 2020-11-12 NOTE — ED Triage Notes (Signed)
Pt c/o bleeding to the outer area of the right side of his nose for about the past hour. Pt has been applying pressure to the area without success. Pt states he was blowing his nose and then noticed the bleeding. Pt is on Retacrit injections.

## 2020-11-12 NOTE — ED Provider Notes (Signed)
[facility]    CSN: [id number] Arrival date & time: 11/12/20  1606      History   Chief Complaint Chief Complaint  Patient presents with   bleeding, nose, external    HPI Brett Boone is a 75 y.o. male.   HPI  42 old male here for evaluation of external nosebleed.  Patient reports that he was getting ready for his granddaughters ballgame when he blew his nose and then noticed some blood dripping onto his tissue and onto the floor.  He states that he held pressure on his nose for approximately 45 minutes without being able to get it to stop bleeding so he came in here for evaluation.  Patient is currently on than Retacrit for treatment of anemia but is not currently on any blood thinners.  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   Macrocytic anemia 08/15/2019   Weight loss 08/15/2019   Elevated ferritin 08/15/2019   Erectile dysfunction 07/25/2019   Class 2 severe obesity due to excess calories with serious comorbidity and body mass index (BMI) of 36.0 to 36.9 in adult [facility]) 07/19/2019   History of adenomatous polyp of colon    Annual physical exam 08/30/2014   Epigastric hernia 07/18/2014    Past Surgical History:  Procedure Laterality Date   CATARACT EXTRACTION W/PHACO Right 03/09/2017   Procedure: CATARACT EXTRACTION PHACO AND INTRAOCULAR LENS PLACEMENT ([place]) RIGHT;  Surgeon: Eulogio Bear, MD;  Location: [place];  Service: Ophthalmology;  Laterality: Right;   COLONOSCOPY  2013   normal- cleared for 5 yrs- Dr Beckey Downing    COLONOSCOPY WITH PROPOFOL N/A 02/17/2017   Procedure: COLONOSCOPY WITH PROPOFOL;  Surgeon: Lin Landsman, MD;  Location: [place];  Service: Endoscopy;  Laterality: N/A;   COLONOSCOPY WITH PROPOFOL N/A 10/12/2019   Procedure: COLONOSCOPY WITH BIOPSY;  Surgeon: Lin Landsman, MD;  Location: [place];  Service: Endoscopy;  Laterality: N/A;   EPIGASTRIC HERNIA REPAIR N/A 07/06/2014   Procedure: HERNIA REPAIR EPIGASTRIC ADULT;  Surgeon: Molly Maduro, MD;  Location: [facility];  Service: General;  Laterality: N/A;   HERNIA REPAIR Bilateral    INSERTION OF MESH N/A 07/06/2014   Procedure: INSERTION OF MESH;  Surgeon: Molly Maduro, MD;  Location: [facility];  Service: General;  Laterality: N/A;   IR RADIOLOGIST EVAL & MGMT  11/07/2019   IR RADIOLOGIST EVAL & MGMT  04/25/2020   POLYPECTOMY  02/17/2017   Procedure: POLYPECTOMY;  Surgeon: Lin Landsman, MD;  Location: [place];  Service: Endoscopy;;   POLYPECTOMY N/A 10/12/2019   Procedure: POLYPECTOMY;  Surgeon: Lin Landsman, MD;  Location: [place];  Service: Endoscopy;  Laterality: N/A;   SHOULDER ARTHROSCOPY Left 2008   SINUS EXPLORATION         Home Medications    Prior to Admission medications   Medication Sig Start Date End Date Taking? Authorizing Provider  Calcium Carb-Cholecalciferol 600-400 MG-UNIT TABS Take by mouth.   Yes [provider]  Epoetin Alfa-epbx (RETACRIT  IJ)    Yes [provider]  tadalafil (CIALIS) 5 MG tablet Take 1 tablet (5 mg total) by mouth daily. 04/30/20  Yes Billey Co, MD    Family History Family History  Problem Relation Age of Onset   Diabetes Father    Heart disease Father 76   Stroke Mother     Social History Social History   Tobacco Use   Smoking status: Former    Packs/day: 0.25    Years: 50.00    Pack years: 12.50    Types: Cigarettes    Quit date: 05/03/2018    Years since quitting: 2.5   Smokeless  tobacco: Former   Tobacco comments:    pt quit smoking  Vaping Use   Vaping Use: Never used  Substance Use Topics   Alcohol use: Yes    Comment: Occasional -1x/month   Drug use: No     Allergies   Amoxicillin-pot clavulanate   Review of Systems Review of Systems  Constitutional:  Negative for activity change, appetite change and fever.  HENT:  Positive for nosebleeds.   Neurological:  Negative for dizziness.  Hematological: Negative.   Psychiatric/Behavioral: Negative.      Physical Exam Triage Vital Signs ED Triage Vitals  Enc Vitals Group     BP 11/12/20 1615 (!) 142/93     Pulse Rate 11/12/20 1615 83     Resp 11/12/20 1615 18     Temp 11/12/20 1615 98.1 F (36.7 C)     Temp Source 11/12/20 1615 Oral     SpO2 11/12/20 1615 95 %     Weight 11/12/20 1613 250 lb (113.4 kg)     Height 11/12/20 1613 5\' 10"  (1.778 m)     Head Circumference --      Peak Flow --      Pain Score 11/12/20 1613 0     Pain Loc --      Pain Edu? --      Excl. in [place]? --    No data found.  Updated Vital Signs BP (!) 142/93 (BP Location: Left Arm)   Pulse 83   Temp 98.1 F (36.7 C) (Oral)   Resp 18   Ht 5\' 10"  (1.778 m)   Wt 250 lb (113.4 kg)   SpO2 95%   BMI 35.87 kg/m   Visual Acuity Right Eye Distance:   Left Eye Distance:   Bilateral Distance:    Right Eye Near:   Left Eye Near:    Bilateral Near:     Physical Exam Vitals and nursing note reviewed.  Constitutional:      General: He is not in acute distress.    Appearance: Normal appearance. He is normal weight. He is not ill-appearing.  HENT:     Head: Normocephalic and atraumatic.     Nose: Nose normal.     Comments: Is a small 2 mm linear abrasion near the right nasal flange.  No active bleeding at this time. Skin:    General: Skin is warm and dry.     Capillary Refill: Capillary refill takes less than 2 seconds.     Findings: No erythema or rash.  Neurological:     General: No focal deficit present.      Mental Status: He is alert and oriented to person, place, and time.  Psychiatric:        Mood and Affect: Mood normal.        Behavior: Behavior normal.  Thought Content: Thought content normal.        Judgment: Judgment normal.     UC Treatments / Results  Labs (all labs ordered are listed, but only abnormal results are displayed) Labs Reviewed - No data to display  EKG   Radiology No results found.  Procedures Procedures (including critical care time)  Medications Ordered in UC Medications - No data to display  Initial Impression / Assessment and Plan / UC Course  I have reviewed the triage vital signs and the nursing notes.  Pertinent labs & imaging results that were available during my care of the patient were reviewed by me and considered in my medical decision making (see chart for details).  Patient is a very pleasant, nontoxic-appearing 75 year old male here for evaluation of an external nosebleed that happened after he blew his nose this afternoon.  He states that he blows nose and then noticed some drops of blood on his tissue and also on the floor.  He then noticed that he had some bleeding from the right side of his nose that he attempted to stop by holding pressure.  He held pressure for 45 minutes unsuccessfully before coming to the urgent care.  Patient denies any pain and there is no active bleeding at present time.  There is a small linear abrasion just superior to the lateral aspect of the nasal flange on the right side of the nose.  When visualized under magnification there is to be approximately 2 mm in length.  There is no active bleeding at present.  Suspect patient may have scratched himself which triggered the bleeding.  With bleeding fully resolved patient has left the clinic with instructions to return for new or worsening symptoms.  Patient also advised that he might be able to try a styptic pencil at home to help resolve minor bleeding such as this in  the future.   Final Clinical Impressions(s) / UC Diagnoses   Final diagnoses:  Abrasion     Discharge Instructions      As we discussed, when visualizing the area of bleeding under magnification there appears to be a small linear abrasion.  There is no active bleeding at present but if it returns you can try using a styptic pencil to achieve hemostasis.  If that is unsuccessful please return for reevaluation.     ED Prescriptions   None    PDMP not reviewed this encounter.   Margarette Canada, NP 11/12/20 1652

## 2020-11-12 NOTE — Discharge Instructions (Addendum)
As we discussed, when visualizing the area of bleeding under magnification there appears to be a small linear abrasion.  There is no active bleeding at present but if it returns you can try using a styptic pencil to achieve hemostasis.  If that is unsuccessful please return for reevaluation.

## 2020-11-13 ENCOUNTER — Inpatient Hospital Stay: Payer: Medicare Other

## 2020-11-13 VITALS — BP 137/76

## 2020-11-13 DIAGNOSIS — D461 Refractory anemia with ring sideroblasts: Secondary | ICD-10-CM

## 2020-11-13 DIAGNOSIS — L570 Actinic keratosis: Secondary | ICD-10-CM | POA: Diagnosis not present

## 2020-11-13 LAB — HEMOGLOBIN AND HEMATOCRIT, BLOOD
HCT: 31 % — ABNORMAL LOW (ref 39.0–52.0)
Hemoglobin: 10 g/dL — ABNORMAL LOW (ref 13.0–17.0)

## 2020-11-13 MED ORDER — EPOETIN ALFA-EPBX 10000 UNIT/ML IJ SOLN
30000.0000 [IU] | INTRAMUSCULAR | Status: DC
  Administered 2020-11-13: 30000 [IU] via SUBCUTANEOUS

## 2020-11-14 ENCOUNTER — Inpatient Hospital Stay: Payer: Medicare Other

## 2020-11-19 DIAGNOSIS — M25611 Stiffness of right shoulder, not elsewhere classified: Secondary | ICD-10-CM | POA: Diagnosis not present

## 2020-11-19 DIAGNOSIS — Z4789 Encounter for other orthopedic aftercare: Secondary | ICD-10-CM | POA: Diagnosis not present

## 2020-11-19 DIAGNOSIS — M7581 Other shoulder lesions, right shoulder: Secondary | ICD-10-CM | POA: Diagnosis not present

## 2020-11-19 DIAGNOSIS — M25511 Pain in right shoulder: Secondary | ICD-10-CM | POA: Diagnosis not present

## 2020-11-20 ENCOUNTER — Inpatient Hospital Stay: Payer: Medicare Other

## 2020-11-20 ENCOUNTER — Other Ambulatory Visit: Payer: Self-pay

## 2020-11-20 VITALS — BP 129/76 | HR 79 | Temp 96.0°F | Resp 18

## 2020-11-20 DIAGNOSIS — D461 Refractory anemia with ring sideroblasts: Secondary | ICD-10-CM | POA: Diagnosis not present

## 2020-11-20 LAB — HEMOGLOBIN AND HEMATOCRIT, BLOOD
HCT: 30.1 % — ABNORMAL LOW (ref 39.0–52.0)
Hemoglobin: 9.7 g/dL — ABNORMAL LOW (ref 13.0–17.0)

## 2020-11-20 MED ORDER — EPOETIN ALFA-EPBX 10000 UNIT/ML IJ SOLN
30000.0000 [IU] | INTRAMUSCULAR | Status: DC
  Administered 2020-11-20: 30000 [IU] via SUBCUTANEOUS

## 2020-11-27 ENCOUNTER — Other Ambulatory Visit: Payer: Self-pay

## 2020-11-27 ENCOUNTER — Inpatient Hospital Stay: Payer: Medicare Other | Attending: Oncology

## 2020-11-27 ENCOUNTER — Inpatient Hospital Stay: Payer: Medicare Other

## 2020-11-27 VITALS — BP 133/79 | HR 76 | Temp 97.2°F | Resp 18

## 2020-11-27 DIAGNOSIS — D461 Refractory anemia with ring sideroblasts: Secondary | ICD-10-CM | POA: Insufficient documentation

## 2020-11-27 LAB — HEMOGLOBIN AND HEMATOCRIT, BLOOD
HCT: 30.7 % — ABNORMAL LOW (ref 39.0–52.0)
Hemoglobin: 9.9 g/dL — ABNORMAL LOW (ref 13.0–17.0)

## 2020-11-27 MED ORDER — EPOETIN ALFA-EPBX 10000 UNIT/ML IJ SOLN
30000.0000 [IU] | INTRAMUSCULAR | Status: AC
  Administered 2020-11-27: 30000 [IU] via SUBCUTANEOUS

## 2020-11-28 ENCOUNTER — Other Ambulatory Visit: Payer: Self-pay | Admitting: Interventional Radiology

## 2020-11-28 ENCOUNTER — Inpatient Hospital Stay: Payer: Medicare Other

## 2020-11-28 DIAGNOSIS — N2889 Other specified disorders of kidney and ureter: Secondary | ICD-10-CM

## 2020-12-03 DIAGNOSIS — M7581 Other shoulder lesions, right shoulder: Secondary | ICD-10-CM | POA: Diagnosis not present

## 2020-12-03 DIAGNOSIS — M25611 Stiffness of right shoulder, not elsewhere classified: Secondary | ICD-10-CM | POA: Diagnosis not present

## 2020-12-03 DIAGNOSIS — M25511 Pain in right shoulder: Secondary | ICD-10-CM | POA: Diagnosis not present

## 2020-12-03 DIAGNOSIS — Z4789 Encounter for other orthopedic aftercare: Secondary | ICD-10-CM | POA: Diagnosis not present

## 2020-12-04 ENCOUNTER — Other Ambulatory Visit: Payer: Self-pay

## 2020-12-04 ENCOUNTER — Inpatient Hospital Stay: Payer: Medicare Other

## 2020-12-04 VITALS — BP 112/74 | HR 65 | Resp 18

## 2020-12-04 DIAGNOSIS — D461 Refractory anemia with ring sideroblasts: Secondary | ICD-10-CM | POA: Diagnosis not present

## 2020-12-04 LAB — HEMOGLOBIN AND HEMATOCRIT, BLOOD
HCT: 31.3 % — ABNORMAL LOW (ref 39.0–52.0)
Hemoglobin: 9.9 g/dL — ABNORMAL LOW (ref 13.0–17.0)

## 2020-12-04 MED ORDER — EPOETIN ALFA-EPBX 10000 UNIT/ML IJ SOLN
30000.0000 [IU] | INTRAMUSCULAR | Status: DC
  Administered 2020-12-04: 30000 [IU] via SUBCUTANEOUS

## 2020-12-05 ENCOUNTER — Ambulatory Visit
Admission: RE | Admit: 2020-12-05 | Discharge: 2020-12-05 | Disposition: A | Discharge status: Home / Self Care | Payer: Medicare Other | Source: Ambulatory Visit | Attending: Interventional Radiology | Admitting: Interventional Radiology

## 2020-12-05 DIAGNOSIS — N2889 Other specified disorders of kidney and ureter: Secondary | ICD-10-CM

## 2020-12-05 HISTORY — PX: IR RADIOLOGIST EVAL & MGMT: IMG5224

## 2020-12-05 NOTE — Progress Notes (Signed)
Chief Complaint: Patient was consulted remotely today (TeleHealth) for follow up of a left renal mass.  History of Present Illness: Brett Boone is a 75 y.o. male seen previously for opinion regarding possible ablation of a left anterior renal mass.  This mass showed little change between MRI studies on 10/16/2019 and 04/12/2020 and decision was made to continue MRI surveillance.  Most recent MRI was performed on 10/17/2020 and the patient has also seen Dr. Diamantina Providence in follow-up recently.  He had a right rotator cuff repair in July.  He continues to have no urinary or abdominal complaints.  Past Medical History:  Diagnosis Date   Anemia    Heart murmur    Hydrocele    Rotator cuff tear arthropathy of right shoulder 08/01/2020   Venous stasis dermatitis of both lower extremities     Past Surgical History:  Procedure Laterality Date   CATARACT EXTRACTION W/PHACO Right 03/09/2017   Procedure: CATARACT EXTRACTION PHACO AND INTRAOCULAR LENS PLACEMENT ([place]) RIGHT;  Surgeon: Eulogio Bear, MD;  Location: [place];  Service: Ophthalmology;  Laterality: Right;   COLONOSCOPY  2013   normal- cleared for 5 yrs- Dr Beckey Downing   COLONOSCOPY WITH PROPOFOL N/A 02/17/2017   Procedure: COLONOSCOPY WITH PROPOFOL;  Surgeon: Lin Landsman, MD;  Location: [place];  Service: Endoscopy;  Laterality: N/A;   COLONOSCOPY WITH PROPOFOL N/A 10/12/2019   Procedure: COLONOSCOPY WITH BIOPSY;  Surgeon: Lin Landsman, MD;  Location: [place];  Service: Endoscopy;  Laterality: N/A;   EPIGASTRIC HERNIA REPAIR N/A 07/06/2014   Procedure: HERNIA REPAIR EPIGASTRIC ADULT;  Surgeon: Molly Maduro, MD;  Location: [facility];  Service: General;  Laterality: N/A;   HERNIA REPAIR Bilateral    INSERTION OF MESH N/A 07/06/2014   Procedure: INSERTION OF MESH;  Surgeon: Molly Maduro, MD;  Location: [facility];  Service: General;  Laterality: N/A;   IR RADIOLOGIST EVAL &  MGMT  11/07/2019   IR RADIOLOGIST EVAL & MGMT  04/25/2020   POLYPECTOMY  02/17/2017   Procedure: POLYPECTOMY;  Surgeon: Lin Landsman, MD;  Location: [place];  Service: Endoscopy;;   POLYPECTOMY N/A 10/12/2019   Procedure: POLYPECTOMY;  Surgeon: Lin Landsman, MD;  Location: [place];  Service: Endoscopy;  Laterality: N/A;   SHOULDER ARTHROSCOPY Left 2008   SINUS EXPLORATION      Allergies: Amoxicillin-pot clavulanate  Medications: Prior to Admission medications   Medication Sig Start Date End Date Taking? Authorizing Provider  Calcium Carb-Cholecalciferol 600-400 MG-UNIT TABS Take by mouth.    [provider]  Epoetin Alfa-epbx (RETACRIT IJ)     [provider]  tadalafil (CIALIS) 5 MG tablet Take 1 tablet (5 mg total) by mouth daily. 04/30/20   Billey Co, MD     Family History  Problem Relation Age of Onset   Diabetes Father    Heart disease Father 61   Stroke Mother     Social History   Socioeconomic History   Marital status: Married    Spouse name: Not on file   Number of children: 3   Years of education: Not on file   Highest education level: Bachelor's degree (e.g., BA, AB, BS)  Occupational History   Occupation: Retired  Tobacco Use   Smoking status: Former    Packs/day: 0.25    Years: 50.00    Pack years: 12.50    Types: Cigarettes    Quit date: 05/03/2018  Years since quitting: 2.5   Smokeless tobacco: Former   Tobacco comments:    pt quit smoking  Vaping Use   Vaping Use: Never used  Substance and Sexual Activity   Alcohol use: Yes    Comment: Occasional -1x/month   Drug use: No   Sexual activity: Yes  Other Topics Concern   Not on file  Social History Narrative   Not on file   Social Determinants of Health   Financial Resource Strain: Low Risk    Difficulty of Paying Living Expenses: Not hard at all  Food Insecurity: No Food Insecurity   Worried About Charity fundraiser in the Last  Year: Never true   [place] in the Last Year: Never true  Transportation Needs: No Transportation Needs   Lack of Transportation (Medical): No   Lack of Transportation (Non-Medical): No  Physical Activity: Inactive   Days of Exercise per Week: 0 days   Minutes of Exercise per Session: 0 min  Stress: No Stress Concern Present   Feeling of Stress : Only a little  Social Connections: Moderately Integrated   Frequency of Communication with Friends and Family: More than three times a week   Frequency of Social Gatherings with Friends and Family: Once a week   Attends Religious Services: More than 4 times per year   Active Member of [employer] or Organizations: No   Attends Archivist Meetings: Never   Marital Status: Married    Review of Systems  Constitutional: Negative.   Respiratory: Negative.    Cardiovascular: Negative.   Gastrointestinal: Negative.   Genitourinary: Negative.   Musculoskeletal: Negative.   Neurological: Negative.    Review of Systems: A 12 point ROS discussed and pertinent positives are indicated in the HPI above.  All other systems are negative.  Physical Exam No direct physical exam was performed (except for noted visual exam findings with Video Visits).   Vital Signs: There were no vitals taken for this visit.  Imaging: No results found.  Labs:  CBC: Recent Labs    08/21/20 1047 08/27/20 0911 09/18/20 1103 09/25/20 1050 10/02/20 1104 10/30/20 1323 11/06/20 1057 11/13/20 1055 11/20/20 1058 11/27/20 1033 12/04/20 1053  WBC 8.0  --  6.2 5.8  --  6.7  --   --   --   --   --   HGB 8.2*   < > 9.8* 9.7*   < > 9.3*   < > 10.0* 9.7* 9.9* 9.9*  HCT 25.6*   < > 29.9* 29.5*   < > 28.8*   < > 31.0* 30.1* 30.7* 31.3*  PLT 302  --  327 311  --  296  --   --   --   --   --    < > = values in this interval not displayed.    COAGS: No results for input(s): INR, APTT in the last 8760 hours.  BMP: Recent Labs    01/23/20 1321  03/19/20 1249 05/28/20 0950 08/21/20 1047  NA 138 135 138 135  K 4.6 5.1 4.8 4.2  CL 106 100 106 103  CO2 26 28 29 29   GLUCOSE 98 126* 113* 136*  BUN 17 21 25* 14  CALCIUM 8.9 9.5 9.0 8.8*  CREATININE 1.14 1.24 1.01 1.01  GFRNONAA >60 >60 >60 >60    LIVER FUNCTION TESTS: Recent Labs    01/23/20 1321 03/19/20 1249 05/28/20 0950 07/23/20 0922 08/21/20 1047  BILITOT 1.4* 1.6* 1.3* 0.8  1.6*  AST 19 24 21 7 15   ALT 15 18 16 12 11   ALKPHOS 34* 38 35* 42* 47  PROT 6.3* 7.1 6.4*  --  6.8  ALBUMIN 3.9 4.4 4.1 4.1 3.9     Assessment and Plan:  I spoke with Mr. Fyock and his wife over the phone.  I reviewed the follow-up MRI dated 10/17/2020.  This demonstrates no significant change in the enhancing endophytic anterior interpolar left renal lesion with maximal diameter of approximately 1.7 cm.  Morphology appears identical to the prior studies.  I continued to recommend active surveillance with MRI in 9 to 12 months which was also suggested by Dr. Diamantina Providence.  The small subcentimeter cyst of the head of the pancreas may be mildly more conspicuous but continues to demonstrate features of a noncomplicated cyst and follow-up MRI was recommended.  I will plan to meet back with Mr. Shorb after the next follow-up MRI.   Electronically Signed: Azzie Roup 12/05/2020, 4:15 PM    I spent a total of 10 Minutes in remote  clinical consultation, greater than 50% of which was counseling/coordinating care for a left renal mass.    Visit type: Audio only (telephone). Audio (no video) only due to patient's lack of internet/smartphone capability. Alternative for in-person consultation at [facility], [address], [place], [place]. This visit type was conducted due to national recommendations for restrictions regarding the COVID-19 Pandemic (e.g. social distancing).  This format is felt to be most appropriate for this patient at this time.  All issues noted in this document were  discussed and addressed.

## 2020-12-11 ENCOUNTER — Inpatient Hospital Stay: Payer: Medicare Other

## 2020-12-11 ENCOUNTER — Other Ambulatory Visit: Payer: Self-pay

## 2020-12-11 DIAGNOSIS — D461 Refractory anemia with ring sideroblasts: Secondary | ICD-10-CM

## 2020-12-11 LAB — HEMOGLOBIN AND HEMATOCRIT, BLOOD
HCT: 31.3 % — ABNORMAL LOW (ref 39.0–52.0)
Hemoglobin: 10.2 g/dL — ABNORMAL LOW (ref 13.0–17.0)

## 2020-12-12 ENCOUNTER — Inpatient Hospital Stay: Payer: Medicare Other

## 2020-12-18 ENCOUNTER — Inpatient Hospital Stay: Payer: Medicare Other

## 2020-12-18 ENCOUNTER — Other Ambulatory Visit: Payer: Self-pay

## 2020-12-18 VITALS — BP 149/72 | HR 81 | Temp 97.0°F | Resp 18

## 2020-12-18 DIAGNOSIS — D461 Refractory anemia with ring sideroblasts: Secondary | ICD-10-CM | POA: Diagnosis not present

## 2020-12-18 LAB — HEMOGLOBIN AND HEMATOCRIT, BLOOD
HCT: 28.6 % — ABNORMAL LOW (ref 39.0–52.0)
Hemoglobin: 9.2 g/dL — ABNORMAL LOW (ref 13.0–17.0)

## 2020-12-18 MED ORDER — EPOETIN ALFA-EPBX 10000 UNIT/ML IJ SOLN
30000.0000 [IU] | INTRAMUSCULAR | Status: DC
  Administered 2020-12-18: 30000 [IU] via SUBCUTANEOUS

## 2020-12-24 ENCOUNTER — Telehealth: Payer: Self-pay | Admitting: Oncology

## 2020-12-24 NOTE — Telephone Encounter (Signed)
Pt called in to reschedule his appt. Call back at [phone]

## 2020-12-24 NOTE — Telephone Encounter (Signed)
Attempts made to reach patient. Left VM with his spouse also explaining that he did not need to have his lab and injection in [place] on 11/30 because he can have that done while seeing Dr. Janese Banks in [place] on 12/2. Left direct extension.

## 2020-12-25 ENCOUNTER — Inpatient Hospital Stay: Payer: Medicare Other

## 2020-12-27 ENCOUNTER — Inpatient Hospital Stay: Payer: Medicare Other

## 2020-12-27 ENCOUNTER — Other Ambulatory Visit: Payer: Self-pay

## 2020-12-27 ENCOUNTER — Inpatient Hospital Stay: Payer: Medicare Other | Admitting: Oncology

## 2020-12-27 DIAGNOSIS — D539 Nutritional anemia, unspecified: Secondary | ICD-10-CM

## 2021-01-02 DIAGNOSIS — M25511 Pain in right shoulder: Secondary | ICD-10-CM | POA: Diagnosis not present

## 2021-01-02 DIAGNOSIS — M25611 Stiffness of right shoulder, not elsewhere classified: Secondary | ICD-10-CM | POA: Diagnosis not present

## 2021-01-02 DIAGNOSIS — Z4789 Encounter for other orthopedic aftercare: Secondary | ICD-10-CM | POA: Diagnosis not present

## 2021-01-02 DIAGNOSIS — M7581 Other shoulder lesions, right shoulder: Secondary | ICD-10-CM | POA: Diagnosis not present

## 2021-01-03 ENCOUNTER — Encounter: Payer: Self-pay | Admitting: Nurse Practitioner

## 2021-01-03 ENCOUNTER — Inpatient Hospital Stay: Payer: Medicare Other | Admitting: Nurse Practitioner

## 2021-01-03 ENCOUNTER — Inpatient Hospital Stay: Payer: Medicare Other

## 2021-01-03 ENCOUNTER — Inpatient Hospital Stay (HOSPITAL_BASED_OUTPATIENT_CLINIC_OR_DEPARTMENT_OTHER): Payer: Medicare Other | Admitting: Nurse Practitioner

## 2021-01-03 ENCOUNTER — Inpatient Hospital Stay: Payer: Medicare Other | Attending: Nurse Practitioner

## 2021-01-03 ENCOUNTER — Other Ambulatory Visit: Payer: Self-pay

## 2021-01-03 VITALS — BP 119/77 | HR 63 | Temp 98.1°F | Resp 18 | Wt 258.5 lb

## 2021-01-03 DIAGNOSIS — D539 Nutritional anemia, unspecified: Secondary | ICD-10-CM

## 2021-01-03 DIAGNOSIS — D461 Refractory anemia with ring sideroblasts: Secondary | ICD-10-CM

## 2021-01-03 DIAGNOSIS — Z148 Genetic carrier of other disease: Secondary | ICD-10-CM

## 2021-01-03 DIAGNOSIS — D649 Anemia, unspecified: Secondary | ICD-10-CM | POA: Diagnosis not present

## 2021-01-03 DIAGNOSIS — Z79899 Other long term (current) drug therapy: Secondary | ICD-10-CM

## 2021-01-03 LAB — COMPREHENSIVE METABOLIC PANEL
ALT: 13 U/L (ref 0–44)
AST: 19 U/L (ref 15–41)
Albumin: 3.9 g/dL (ref 3.5–5.0)
Alkaline Phosphatase: 42 U/L (ref 38–126)
Anion gap: 7 (ref 5–15)
BUN: 15 mg/dL (ref 8–23)
CO2: 26 mmol/L (ref 22–32)
Calcium: 9 mg/dL (ref 8.9–10.3)
Chloride: 103 mmol/L (ref 98–111)
Creatinine, Ser: 0.91 mg/dL (ref 0.61–1.24)
GFR, Estimated: >60 mL/min (ref >60)
Glucose, Bld: 128 mg/dL — ABNORMAL HIGH (ref 70–99)
Potassium: 4.9 mmol/L (ref 3.5–5.1)
Sodium: 136 mmol/L (ref 135–145)
Total Bilirubin: 1.2 mg/dL (ref 0.3–1.2)
Total Protein: 6.3 g/dL — ABNORMAL LOW (ref 6.5–8.1)

## 2021-01-03 LAB — CBC WITH DIFFERENTIAL/PLATELET
Abs Immature Granulocytes: 0.05 10*3/uL (ref 0.00–0.07)
Basophils Absolute: 0.1 10*3/uL (ref 0.0–0.1)
Basophils Relative: 1 %
Eosinophils Absolute: 0.1 10*3/uL (ref 0.0–0.5)
Eosinophils Relative: 1 %
HCT: 28.8 % — ABNORMAL LOW (ref 39.0–52.0)
Hemoglobin: 9.3 g/dL — ABNORMAL LOW (ref 13.0–17.0)
Immature Granulocytes: 1 %
Lymphocytes Relative: 19 %
Lymphs Abs: 1.4 10*3/uL (ref 0.7–4.0)
MCH: 34.7 pg — ABNORMAL HIGH (ref 26.0–34.0)
MCHC: 32.3 g/dL (ref 30.0–36.0)
MCV: 107.5 fL — ABNORMAL HIGH (ref 80.0–100.0)
Monocytes Absolute: 0.6 10*3/uL (ref 0.1–1.0)
Monocytes Relative: 8 %
Neutro Abs: 5.2 10*3/uL (ref 1.7–7.7)
Neutrophils Relative %: 70 %
Platelets: 296 10*3/uL (ref 150–400)
RBC: 2.68 MIL/uL — ABNORMAL LOW (ref 4.22–5.81)
RDW: 19.1 % — ABNORMAL HIGH (ref 11.5–15.5)
WBC: 7.4 10*3/uL (ref 4.0–10.5)
nRBC: 0.4 % — ABNORMAL HIGH (ref 0.0–0.2)

## 2021-01-03 MED ORDER — EPOETIN ALFA-EPBX 10000 UNIT/ML IJ SOLN
30000.0000 [IU] | INTRAMUSCULAR | Status: DC
  Administered 2021-01-03: 30000 [IU] via SUBCUTANEOUS
  Filled 2021-01-03 (×2): qty 3

## 2021-01-03 NOTE — Progress Notes (Signed)
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 Bone marrow biopsy on 09/11/2019 revealed macrocytic anemia and hypercellular bone marrow for age with dyspoietic changes primarily involving the erythroid cell lines associated with abundant ring sideroblasts (> 15%).  There was no increase in blasts. Flow cytometry was negative. Cytogenetics revealed 45,X,-Y [20].  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 IPSS score is low.   Baseline EPO level was 63.3 in August 2021.  Patient received Aranesp for a month in September 2021 and was then switched to [place].  He is presently on weekly Retacrit 20,000 units.   Patient found to have elevated ferritin and when we was under the care of Dr. Mike Boone underwent hemochromatosis testing in July 2021 which showed single mutation for H63D.   MRI abdomen in September 2021 showed 1.3 cm lesion in the left kidney concerning for renal cell carcinoma for which he follows up with urology    Interval history-patient is 75 year old male who returns to clinic for labs and follow-up of anemia.  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  He has been receiving Retacrit weekly.   He has been seen by IR for possible ablation of the left anterior renal mass however, mass appears unchanged and morphologically stable.  Continued surveillance  with MRI was recommended.  He continues to feel well and denies specific complaints.  He is tolerating Retacrit well.  He has baseline fatigue which is stable and overall unchanged.   ECOG PS- 1 Pain scale- 0  Review of systems- Review of Systems  Constitutional:  Positive for malaise/fatigue. Negative for chills, fever and weight loss.  HENT:  Negative for congestion, ear discharge and nosebleeds.   Eyes:  Negative for blurred vision.  Respiratory:  Negative for cough, hemoptysis, sputum production, shortness of breath and wheezing.   Cardiovascular:  Negative for chest pain, palpitations, orthopnea and claudication.  Gastrointestinal:  Negative for abdominal pain, blood in stool, constipation, diarrhea, heartburn, melena, nausea and vomiting.  Genitourinary:  Negative for dysuria, flank pain, frequency, hematuria and urgency.  Musculoskeletal:  Negative for back pain, joint pain and myalgias.  Skin:  Negative for rash.  Neurological:  Negative for dizziness, tingling, focal weakness, seizures, weakness and headaches.  Endo/Heme/Allergies:  Does not bruise/bleed easily.  Psychiatric/Behavioral:  Negative for depression and suicidal ideas. The patient does not have insomnia.      Allergies  Allergen Reactions   Amoxicillin-Pot Clavulanate Hives and Nausea And Vomiting    Past Medical History:  Diagnosis Date   Anemia    Heart murmur    Hydrocele    Rotator cuff tear arthropathy of right shoulder 08/01/2020   Venous stasis dermatitis of both lower extremities     Past Surgical History:  Procedure Laterality Date   CATARACT EXTRACTION W/PHACO Right 03/09/2017   Procedure: CATARACT EXTRACTION PHACO AND INTRAOCULAR LENS PLACEMENT ([facility]) RIGHT;  Surgeon: Eulogio Bear, MD;  Location:  [place];  Service: Ophthalmology;  Laterality: Right;   COLONOSCOPY  2013   normal- cleared for 5 yrs- Dr Beckey Downing   COLONOSCOPY WITH PROPOFOL N/A 02/17/2017   Procedure: COLONOSCOPY  WITH PROPOFOL;  Surgeon: Lin Landsman, MD;  Location: [place];  Service: Endoscopy;  Laterality: N/A;   COLONOSCOPY WITH PROPOFOL N/A 10/12/2019   Procedure: COLONOSCOPY WITH BIOPSY;  Surgeon: Lin Landsman, MD;  Location: [place];  Service: Endoscopy;  Laterality: N/A;   EPIGASTRIC HERNIA REPAIR N/A 07/06/2014   Procedure: HERNIA REPAIR EPIGASTRIC ADULT;  Surgeon: Molly Maduro, MD;  Location: [facility];  Service: General;  Laterality: N/A;   HERNIA REPAIR Bilateral    INSERTION OF MESH N/A 07/06/2014   Procedure: INSERTION OF MESH;  Surgeon: Molly Maduro, MD;  Location: [facility];  Service: General;  Laterality: N/A;   IR RADIOLOGIST EVAL & MGMT  11/07/2019   IR RADIOLOGIST EVAL & MGMT  04/25/2020   IR RADIOLOGIST EVAL & MGMT  12/05/2020   POLYPECTOMY  02/17/2017   Procedure: POLYPECTOMY;  Surgeon: Lin Landsman, MD;  Location: [place];  Service: Endoscopy;;   POLYPECTOMY N/A 10/12/2019   Procedure: POLYPECTOMY;  Surgeon: Lin Landsman, MD;  Location: [place];  Service: Endoscopy;  Laterality: N/A;   SHOULDER ARTHROSCOPY Left 2008   SINUS EXPLORATION      Social History   Socioeconomic History   Marital status: Married    Spouse name: Not on file   Number of children: 3   Years of education: Not on file   Highest education level: Bachelor's degree (e.g., BA, AB, BS)  Occupational History   Occupation: Retired  Tobacco Use   Smoking status: Former    Packs/day: 0.25    Years: 50.00    Pack years: 12.50    Types: Cigarettes    Quit date: 05/03/2018    Years since quitting: 2.6   Smokeless tobacco: Former   Tobacco comments:    pt quit smoking  Vaping Use   Vaping Use: Never used  Substance and Sexual Activity   Alcohol use: Yes    Comment: Occasional -1x/month   Drug use: No   Sexual activity: Yes  Other Topics Concern   Not on file  Social History Narrative   Not on file   Social Determinants of  Health   Financial Resource Strain: Low Risk    Difficulty of Paying Living Expenses: Not hard at all  Food Insecurity: No Food Insecurity   Worried About Charity fundraiser in the Last Year: Never true   [place] in the Last Year: Never true  Transportation Needs: No Transportation Needs   Lack of Transportation (Medical): No   Lack of Transportation (Non-Medical): No  Physical Activity: Inactive   Days of Exercise per Week: 0 days   Minutes of Exercise per Session: 0 min  Stress: No Stress Concern Present   Feeling of Stress : Only a little  Social Connections: Moderately Integrated   Frequency of Communication with Friends and Family: More than three times a week   Frequency of Social Gatherings with Friends and Family: Once a week   Attends Religious Services: More than 4 times per year   Active Member of [employer] or Organizations: No   Attends Archivist Meetings: Never   Marital Status: Married  Human resources officer Violence: Not At Risk   Fear of Current or Ex-Partner: No   Emotionally Abused: No  Physically Abused: No   Sexually Abused: No    Family History  Problem Relation Age of Onset   Diabetes Father    Heart disease Father 104   Stroke Mother     Current Outpatient Medications:    Calcium Carb-Cholecalciferol 600-400 MG-UNIT TABS, Take by mouth., Disp: , Rfl:    Epoetin Alfa-epbx (RETACRIT IJ), , Disp: , Rfl:    tadalafil (CIALIS) 5 MG tablet, Take 1 tablet (5 mg total) by mouth daily., Disp: 30 tablet, Rfl: 11 No current facility-administered medications for this visit.  Facility-Administered Medications Ordered in Other Visits:    epoetin alfa-epbx (RETACRIT) injection 30,000 Units, 30,000 Units, Subcutaneous, Weekly, Sindy Guadeloupe, MD, 30,000 Units at 09/25/20 1113   epoetin alfa-epbx (RETACRIT) injection 30,000 Units, 30,000 Units, Subcutaneous, Weekly, Sindy Guadeloupe, MD, 30,000 Units at 10/30/20 1443   epoetin alfa-epbx (RETACRIT) injection  30,000 Units, 30,000 Units, Subcutaneous, Weekly, Sindy Guadeloupe, MD, 30,000 Units at 11/27/20 1044  Physical exam:  Vitals:   01/03/21 1400  BP: 127/73  Pulse: 75   Physical Exam Constitutional:      General: He is not in acute distress. Cardiovascular:     Rate and Rhythm: Normal rate and regular rhythm.     Heart sounds: Normal heart sounds.  Pulmonary:     Effort: Pulmonary effort is normal.     Breath sounds: Normal breath sounds.  Abdominal:     General: Bowel sounds are normal.     Palpations: Abdomen is soft.  Skin:    General: Skin is warm and dry.  Neurological:     Mental Status: He is alert and oriented to person, place, and time.     CMP Latest Ref Rng & Units 08/21/2020  Glucose 70 - 99 mg/dL 136(H)  BUN 8 - 23 mg/dL 14  Creatinine 0.61 - 1.24 mg/dL 1.01  Sodium 135 - 145 mmol/L 135  Potassium 3.5 - 5.1 mmol/L 4.2  Chloride 98 - 111 mmol/L 103  CO2 22 - 32 mmol/L 29  Calcium 8.9 - 10.3 mg/dL 8.8(L)  Total Protein 6.5 - 8.1 g/dL 6.8  Total Bilirubin 0.3 - 1.2 mg/dL 1.6(H)  Alkaline Phos 38 - 126 U/L 47  AST 15 - 41 U/L 15  ALT 0 - 44 U/L 11   CBC Latest Ref Rng & Units 01/03/2021  WBC 4.0 - 10.5 K/uL 7.4  Hemoglobin 13.0 - 17.0 g/dL 9.3(L)  Hematocrit 39.0 - 52.0 % 28.8(L)  Platelets 150 - 400 K/uL 296   IR Radiologist Eval & Mgmt  Result Date: 12/06/2020 Please refer to notes tab for details about interventional procedure. (Op Note)    Assessment and plan- Patient is a 75 y.o. 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  He is here for routine follow-up of anemia and possible Retacrit.   Patient's hemoglobin continues to remain stable at 9.5 since June 2021.  Recommend continuing Retacrit every week as long as hemoglobin is stable. Patient would like to try every other week dosing which is fine. However, if hemoglobin drops to less than 9, would consider Luspatercept for him vs restarting  weekly dosing.  Overall his quality of life is good other than mild fatigue which is stable.  Hemochromatosis-heterozygous.  He had documented elevated ferritin and iron saturation.  In setting of anemia recommend monitoring.  Weekly to bi-weekly H&H, +/- retacrit 30,000 units  3 months- labs (cbc, cmp, ferritin, iron studies),  Dr. Janese Banks, +/- retacrit  Visit Diagnosis 1. 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

## 2021-01-03 NOTE — Progress Notes (Signed)
Pt states that it is becoming more frequent him waking up in the middle of the night due to his feet being so hot at times he will need to step on a bag of ice normally occurs between 1-3AM. Not able to sleep well. Also,how much effect does the retacrit injection cause him to bruise and bleed the way he has been.

## 2021-01-10 ENCOUNTER — Inpatient Hospital Stay: Payer: Medicare Other

## 2021-01-10 ENCOUNTER — Other Ambulatory Visit: Payer: Self-pay

## 2021-01-10 VITALS — BP 136/75 | HR 82

## 2021-01-10 DIAGNOSIS — D461 Refractory anemia with ring sideroblasts: Secondary | ICD-10-CM

## 2021-01-10 LAB — HEMOGLOBIN AND HEMATOCRIT, BLOOD
HCT: 29.2 % — ABNORMAL LOW (ref 39.0–52.0)
Hemoglobin: 9.5 g/dL — ABNORMAL LOW (ref 13.0–17.0)

## 2021-01-10 MED ORDER — EPOETIN ALFA-EPBX 10000 UNIT/ML IJ SOLN
30000.0000 [IU] | INTRAMUSCULAR | Status: DC
  Administered 2021-01-10: 30000 [IU] via SUBCUTANEOUS
  Filled 2021-01-10: qty 3

## 2021-01-17 ENCOUNTER — Inpatient Hospital Stay: Payer: Medicare Other

## 2021-01-22 ENCOUNTER — Inpatient Hospital Stay: Payer: Medicare Other

## 2021-01-22 ENCOUNTER — Other Ambulatory Visit: Payer: Self-pay

## 2021-01-22 VITALS — BP 135/83 | HR 76

## 2021-01-22 DIAGNOSIS — D461 Refractory anemia with ring sideroblasts: Secondary | ICD-10-CM

## 2021-01-22 LAB — HEMOGLOBIN AND HEMATOCRIT, BLOOD
HCT: 27.4 % — ABNORMAL LOW (ref 39.0–52.0)
Hemoglobin: 8.9 g/dL — ABNORMAL LOW (ref 13.0–17.0)

## 2021-01-22 MED ORDER — EPOETIN ALFA-EPBX 10000 UNIT/ML IJ SOLN
30000.0000 [IU] | INTRAMUSCULAR | Status: DC
  Administered 2021-01-22: 15:00:00 30000 [IU] via SUBCUTANEOUS
  Filled 2021-01-22: qty 3

## 2021-01-28 DIAGNOSIS — M25611 Stiffness of right shoulder, not elsewhere classified: Secondary | ICD-10-CM | POA: Diagnosis not present

## 2021-01-28 DIAGNOSIS — Z4789 Encounter for other orthopedic aftercare: Secondary | ICD-10-CM | POA: Diagnosis not present

## 2021-01-28 DIAGNOSIS — M25511 Pain in right shoulder: Secondary | ICD-10-CM | POA: Diagnosis not present

## 2021-01-28 DIAGNOSIS — M7581 Other shoulder lesions, right shoulder: Secondary | ICD-10-CM | POA: Diagnosis not present

## 2021-01-31 ENCOUNTER — Inpatient Hospital Stay: Payer: Medicare Other

## 2021-02-05 ENCOUNTER — Inpatient Hospital Stay: Payer: Medicare Other | Attending: Oncology

## 2021-02-05 ENCOUNTER — Inpatient Hospital Stay: Payer: Medicare Other

## 2021-02-05 ENCOUNTER — Other Ambulatory Visit: Payer: Self-pay

## 2021-02-05 VITALS — BP 110/71 | HR 87

## 2021-02-05 DIAGNOSIS — D461 Refractory anemia with ring sideroblasts: Secondary | ICD-10-CM | POA: Diagnosis not present

## 2021-02-05 DIAGNOSIS — Z9889 Other specified postprocedural states: Secondary | ICD-10-CM | POA: Diagnosis not present

## 2021-02-05 DIAGNOSIS — W1849XD Other slipping, tripping and stumbling without falling, subsequent encounter: Secondary | ICD-10-CM | POA: Diagnosis not present

## 2021-02-05 DIAGNOSIS — S46011D Strain of muscle(s) and tendon(s) of the rotator cuff of right shoulder, subsequent encounter: Secondary | ICD-10-CM | POA: Diagnosis not present

## 2021-02-05 LAB — HEMOGLOBIN AND HEMATOCRIT, BLOOD
HCT: 29.9 % — ABNORMAL LOW (ref 39.0–52.0)
Hemoglobin: 9.7 g/dL — ABNORMAL LOW (ref 13.0–17.0)

## 2021-02-05 MED ORDER — EPOETIN ALFA-EPBX 10000 UNIT/ML IJ SOLN
30000.0000 [IU] | INTRAMUSCULAR | Status: DC
  Administered 2021-02-05: 30000 [IU] via SUBCUTANEOUS
  Filled 2021-02-05: qty 3

## 2021-02-12 ENCOUNTER — Ambulatory Visit: Payer: Medicare Other

## 2021-02-14 ENCOUNTER — Inpatient Hospital Stay: Payer: Medicare Other

## 2021-02-19 ENCOUNTER — Inpatient Hospital Stay: Payer: Medicare Other

## 2021-02-19 ENCOUNTER — Other Ambulatory Visit: Payer: Self-pay

## 2021-02-19 VITALS — BP 136/73 | HR 69

## 2021-02-19 DIAGNOSIS — D461 Refractory anemia with ring sideroblasts: Secondary | ICD-10-CM

## 2021-02-19 LAB — HEMOGLOBIN AND HEMATOCRIT, BLOOD
HCT: 27.9 % — ABNORMAL LOW (ref 39.0–52.0)
Hemoglobin: 9.1 g/dL — ABNORMAL LOW (ref 13.0–17.0)

## 2021-02-19 MED ORDER — EPOETIN ALFA-EPBX 10000 UNIT/ML IJ SOLN
30000.0000 [IU] | INTRAMUSCULAR | Status: DC
  Administered 2021-02-19: 15:00:00 30000 [IU] via SUBCUTANEOUS
  Filled 2021-02-19: qty 3

## 2021-02-26 ENCOUNTER — Ambulatory Visit (INDEPENDENT_AMBULATORY_CARE_PROVIDER_SITE_OTHER): Payer: Medicare Other

## 2021-02-26 DIAGNOSIS — Z0000 Encounter for general adult medical examination without abnormal findings: Secondary | ICD-10-CM | POA: Diagnosis not present

## 2021-02-26 NOTE — Patient Instructions (Signed)
[name] , Thank you for taking time to come for your Medicare Wellness Visit. I appreciate your ongoing commitment to your health goals. Please review the following plan we discussed and let me know if I can assist you in the future.   Screening recommendations/referrals: Colonoscopy: done 10/12/19; repeat 09/2022 Recommended yearly ophthalmology/optometry visit for glaucoma screening and checkup Recommended yearly dental visit for hygiene and checkup  Vaccinations: Influenza vaccine: due Pneumococcal vaccine: done 10/11/13 Tdap vaccine: done 04/14/16 Shingles vaccine: Shingrix discussed. Please contact your pharmacy for coverage information.  Covid-19:  done 03/08/19, 03/29/19 & 01/04/20  Advanced directives: Advance directive discussed with you today. I have provided a copy for you to complete at home and have notarized. Once this is complete please bring a copy in to our office so we can scan it into your chart.   Conditions/risks identified: Keep up the great work!   Next appointment: Follow up in one year for your annual wellness visit.   Preventive Care 39 Years and Older, Male Preventive care refers to lifestyle choices and visits with your health care provider that can promote health and wellness. What does preventive care include? A yearly physical exam. This is also called an annual well check. Dental exams once or twice a year. Routine eye exams. Ask your health care provider how often you should have your eyes checked. Personal lifestyle choices, including: Daily care of your teeth and gums. Regular physical activity. Eating a healthy diet. Avoiding tobacco and drug use. Limiting alcohol use. Practicing safe sex. Taking low doses of aspirin every day. Taking vitamin and mineral supplements as recommended by your health care provider. What happens during an annual well check? The services and screenings done by your health care provider during your annual well check will  depend on your age, overall health, lifestyle risk factors, and family history of disease. Counseling  Your health care provider may ask you questions about your: Alcohol use. Tobacco use. Drug use. Emotional well-being. Home and relationship well-being. Sexual activity. Eating habits. History of falls. Memory and ability to understand (cognition). Work and work Statistician. Screening  You may have the following tests or measurements: Height, weight, and BMI. Blood pressure. Lipid and cholesterol levels. These may be checked every 5 years, or more frequently if you are over 7 years old. Skin check. Lung cancer screening. You may have this screening every year starting at age 52 if you have a 30-pack-year history of smoking and currently smoke or have quit within the past 15 years. Fecal occult blood test (FOBT) of the stool. You may have this test every year starting at age 31. Flexible sigmoidoscopy or colonoscopy. You may have a sigmoidoscopy every 5 years or a colonoscopy every 10 years starting at age 17. Prostate cancer screening. Recommendations will vary depending on your family history and other risks. Hepatitis C blood test. Hepatitis B blood test. Sexually transmitted disease (STD) testing. Diabetes screening. This is done by checking your blood sugar (glucose) after you have not eaten for a while (fasting). You may have this done every 1-3 years. Abdominal aortic aneurysm (AAA) screening. You may need this if you are a current or former smoker. Osteoporosis. You may be screened starting at age 12 if you are at high risk. Talk with your health care provider about your test results, treatment options, and if necessary, the need for more tests. Vaccines  Your health care provider may recommend certain vaccines, such as: Influenza vaccine. This is recommended every  year. Tetanus, diphtheria, and acellular pertussis (Tdap, Td) vaccine. You may need a Td booster every 10  years. Zoster vaccine. You may need this after age 66. Pneumococcal 13-valent conjugate (PCV13) vaccine. One dose is recommended after age 49. Pneumococcal polysaccharide (PPSV23) vaccine. One dose is recommended after age 27. Talk to your health care provider about which screenings and vaccines you need and how often you need them. This information is not intended to replace advice given to you by your health care provider. Make sure you discuss any questions you have with your health care provider. Document Released: 02/08/2015 Document Revised: 10/02/2015 Document Reviewed: 11/13/2014 Elsevier Interactive Patient Education  2017 [place] Prevention in the Home Falls can cause injuries. They can happen to people of all ages. There are many things you can do to make your home safe and to help prevent falls. What can I do on the outside of my home? Regularly fix the edges of walkways and driveways and fix any cracks. Remove anything that might make you trip as you walk through a door, such as a raised step or threshold. Trim any bushes or trees on the path to your home. Use bright outdoor lighting. Clear any walking paths of anything that might make someone trip, such as rocks or tools. Regularly check to see if handrails are loose or broken. Make sure that both sides of any steps have handrails. Any raised decks and porches should have guardrails on the edges. Have any leaves, snow, or ice cleared regularly. Use sand or salt on walking paths during winter. Clean up any spills in your garage right away. This includes oil or grease spills. What can I do in the bathroom? Use night lights. Install grab bars by the toilet and in the tub and shower. Do not use towel bars as grab bars. Use non-skid mats or decals in the tub or shower. If you need to sit down in the shower, use a plastic, non-slip stool. Keep the floor dry. Clean up any water that spills on the floor as soon as it  happens. Remove soap buildup in the tub or shower regularly. Attach bath mats securely with double-sided non-slip rug tape. Do not have throw rugs and other things on the floor that can make you trip. What can I do in the bedroom? Use night lights. Make sure that you have a light by your bed that is easy to reach. Do not use any sheets or blankets that are too big for your bed. They should not hang down onto the floor. Have a firm chair that has side arms. You can use this for support while you get dressed. Do not have throw rugs and other things on the floor that can make you trip. What can I do in the kitchen? Clean up any spills right away. Avoid walking on wet floors. Keep items that you use a lot in easy-to-reach places. If you need to reach something above you, use a strong step stool that has a grab bar. Keep electrical cords out of the way. Do not use floor polish or wax that makes floors slippery. If you must use wax, use non-skid floor wax. Do not have throw rugs and other things on the floor that can make you trip. What can I do with my stairs? Do not leave any items on the stairs. Make sure that there are handrails on both sides of the stairs and use them. Fix handrails that are broken or  loose. Make sure that handrails are as long as the stairways. Check any carpeting to make sure that it is firmly attached to the stairs. Fix any carpet that is loose or worn. Avoid having throw rugs at the top or bottom of the stairs. If you do have throw rugs, attach them to the floor with carpet tape. Make sure that you have a light switch at the top of the stairs and the bottom of the stairs. If you do not have them, ask someone to add them for you. What else can I do to help prevent falls? Wear shoes that: Do not have high heels. Have rubber bottoms. Are comfortable and fit you well. Are closed at the toe. Do not wear sandals. If you use a stepladder: Make sure that it is fully opened.  Do not climb a closed stepladder. Make sure that both sides of the stepladder are locked into place. Ask someone to hold it for you, if possible. Clearly mark and make sure that you can see: Any grab bars or handrails. First and last steps. Where the edge of each step is. Use tools that help you move around (mobility aids) if they are needed. These include: Canes. Walkers. Scooters. Crutches. Turn on the lights when you go into a dark area. Replace any light bulbs as soon as they burn out. Set up your furniture so you have a clear path. Avoid moving your furniture around. If any of your floors are uneven, fix them. If there are any pets around you, be aware of where they are. Review your medicines with your doctor. Some medicines can make you feel dizzy. This can increase your chance of falling. Ask your doctor what other things that you can do to help prevent falls. This information is not intended to replace advice given to you by your health care provider. Make sure you discuss any questions you have with your health care provider. Document Released: 11/08/2008 Document Revised: 06/20/2015 Document Reviewed: 02/16/2014 Elsevier Interactive Patient Education  2017 [employer].

## 2021-02-26 NOTE — Progress Notes (Signed)
Subjective:   Brett Boone is a 76 y.o. male who presents for Medicare Annual/Subsequent preventive examination.  Virtual Visit via Telephone Note  I connected with  Brett Boone on 02/26/21 at  3:20 PM EST by telephone and verified that I am speaking with the correct person using two identifiers.  Location: Patient: home Provider: [facility] Persons participating in the virtual visit: [place]   I discussed the limitations, risks, security and privacy concerns of performing an evaluation and management service by telephone and the availability of in person appointments. The patient expressed understanding and agreed to proceed.  Interactive audio and video telecommunications were attempted between this nurse and patient, however failed, due to patient having technical difficulties OR patient did not have access to video capability.  We continued and completed visit with audio only.  Some vital signs may be absent or patient reported.   [name], LPN   Review of Systems     Cardiac Risk Factors include: advanced age (>5men, >12 women);male gender     Objective:    There were no vitals filed for this visit. There is no height or weight on file to calculate BMI.  Advanced Directives 02/26/2021 01/03/2021 10/30/2020 08/21/2020 05/28/2020 02/07/2020 01/23/2020  Does Patient Have a Medical Advance Directive? No No No No No Yes Yes  Type of Advance Directive - - - - - Catering manager;Living will  Does patient want to make changes to medical advance directive? - - - - - Yes (MAU/Ambulatory/Procedural Areas - Information given) Yes (MAU/Ambulatory/Procedural Areas - Information given)  Copy of [place] in Chart? - - - - - - -  Would patient like information on creating a medical advance directive? Yes (MAU/Ambulatory/Procedural Areas - Information given) No - Patient declined No - Patient declined  No - Patient declined No - Patient declined - -    Current Medications (verified) Outpatient Encounter Medications as of 02/26/2021  Medication Sig   acetaminophen (TYLENOL) 325 MG tablet Take 650 mg by mouth every 6 (six) hours as needed.   Calcium Carb-Cholecalciferol 600-400 MG-UNIT TABS Take by mouth.   Epoetin Alfa-epbx (RETACRIT IJ)    tadalafil (CIALIS) 5 MG tablet Take 1 tablet (5 mg total) by mouth daily.   Facility-Administered Encounter Medications as of 02/26/2021  Medication   epoetin alfa-epbx (RETACRIT) injection 30,000 Units   epoetin alfa-epbx (RETACRIT) injection 30,000 Units   epoetin alfa-epbx (RETACRIT) injection 30,000 Units    Allergies (verified) Amoxicillin-pot clavulanate   History: Past Medical History:  Diagnosis Date   Anemia    Heart murmur    Hydrocele    Rotator cuff tear arthropathy of right shoulder 08/01/2020   Venous stasis dermatitis of both lower extremities    Past Surgical History:  Procedure Laterality Date   CATARACT EXTRACTION W/PHACO Right 03/09/2017   Procedure: CATARACT EXTRACTION PHACO AND INTRAOCULAR LENS PLACEMENT ([place]) RIGHT;  Surgeon: Eulogio Bear, MD;  Location: [place];  Service: Ophthalmology;  Laterality: Right;   COLONOSCOPY  2013   normal- cleared for 5 yrs- Dr Beckey Downing   COLONOSCOPY WITH PROPOFOL N/A 02/17/2017   Procedure: COLONOSCOPY WITH PROPOFOL;  Surgeon: Lin Landsman, MD;  Location: [place];  Service: Endoscopy;  Laterality: N/A;   COLONOSCOPY WITH PROPOFOL N/A 10/12/2019   Procedure: COLONOSCOPY WITH BIOPSY;  Surgeon: Lin Landsman, MD;  Location: [place];  Service: Endoscopy;  Laterality: N/A;  EPIGASTRIC HERNIA REPAIR N/A 07/06/2014   Procedure: HERNIA REPAIR EPIGASTRIC ADULT;  Surgeon: Molly Maduro, MD;  Location: [facility];  Service: General;  Laterality: N/A;   HERNIA REPAIR Bilateral    INSERTION OF MESH N/A 07/06/2014   Procedure: INSERTION OF MESH;   Surgeon: Molly Maduro, MD;  Location: [facility];  Service: General;  Laterality: N/A;   IR RADIOLOGIST EVAL & MGMT  11/07/2019   IR RADIOLOGIST EVAL & MGMT  04/25/2020   IR RADIOLOGIST EVAL & MGMT  12/05/2020   POLYPECTOMY  02/17/2017   Procedure: POLYPECTOMY;  Surgeon: Lin Landsman, MD;  Location: [place];  Service: Endoscopy;;   POLYPECTOMY N/A 10/12/2019   Procedure: POLYPECTOMY;  Surgeon: Lin Landsman, MD;  Location: [place];  Service: Endoscopy;  Laterality: N/A;   SHOULDER ARTHROSCOPY Left 2008   SINUS EXPLORATION     Family History  Problem Relation Age of Onset   Diabetes Father    Heart disease Father 16   Stroke Mother    Social History   Socioeconomic History   Marital status: Married    Spouse name: Not on file   Number of children: 3   Years of education: Not on file   Highest education level: Bachelor's degree (e.g., BA, AB, BS)  Occupational History   Occupation: Retired  Tobacco Use   Smoking status: Former    Packs/day: 0.25    Years: 50.00    Pack years: 12.50    Types: Cigarettes    Quit date: 05/03/2018    Years since quitting: 2.8   Smokeless tobacco: Former   Tobacco comments:    pt quit smoking  Vaping Use   Vaping Use: Never used  Substance and Sexual Activity   Alcohol use: Yes    Comment: Occasional -1x/month   Drug use: No   Sexual activity: Yes  Other Topics Concern   Not on file  Social History Narrative   Not on file   Social Determinants of Health   Financial Resource Strain: Low Risk    Difficulty of Paying Living Expenses: Not hard at all  Food Insecurity: No Food Insecurity   Worried About Charity fundraiser in the Last Year: Never true   [place] in the Last Year: Never true  Transportation Needs: No Transportation Needs   Lack of Transportation (Medical): No   Lack of Transportation (Non-Medical): No  Physical Activity: Insufficiently Active   Days of Exercise per Week: 3 days    Minutes of Exercise per Session: 30 min  Stress: No Stress Concern Present   Feeling of Stress : Only a little  Social Connections: Moderately Integrated   Frequency of Communication with Friends and Family: More than three times a week   Frequency of Social Gatherings with Friends and Family: Once a week   Attends Religious Services: More than 4 times per year   Active Member of [employer] or Organizations: No   Attends Music therapist: Never   Marital Status: Married    Tobacco Counseling Counseling given: Not Answered Tobacco comments: pt quit smoking   Clinical Intake:  Pre-visit preparation completed: Yes  Pain : No/denies pain     Nutritional Risks: None Diabetes: No  How often do you need to have someone help you when you read instructions, pamphlets, or other written materials from your doctor or pharmacy?: 1 - Never    Interpreter Needed?: No  Information entered by :: [name] LPN  Activities of Daily Living In your present state of health, do you have any difficulty performing the following activities: 02/26/2021  Hearing? Y  Comment needs to replace hearing aids  Vision? N  Difficulty concentrating or making decisions? N  Walking or climbing stairs? N  Dressing or bathing? N  Doing errands, shopping? N  Preparing Food and eating ? N  Using the Toilet? N  In the past six months, have you accidently leaked urine? N  Do you have problems with loss of bowel control? N  Managing your Medications? N  Managing your Finances? N  Housekeeping or managing your Housekeeping? N  Some recent data might be hidden    Patient Care Team: [name], MD as PCP - General (Family Medicine) Sindy Guadeloupe, MD as Consulting Physician (Oncology)  Indicate any recent Medical Services you may have received from other than [facility] providers in the past year (date may be approximate).     Assessment:   This is a routine wellness examination for  K[place].  Hearing/Vision screen Hearing Screening - Comments:: Pt has had hearing aids in the past, lost them and has not replaced them yet.  Vision Screening - Comments:: Vision screenings done at [place] issues and exercise activities discussed: Current Exercise Habits: Home exercise routine, Type of exercise: stretching;calisthenics;strength training/weights, Time (Minutes): 30, Frequency (Times/Week): 3, Weekly Exercise (Minutes/Week): 90, Intensity: Mild, Exercise limited by: orthopedic condition(s)   Goals Addressed             This Visit's Progress    Have 3 meals a day   On track    Eat 3 healthy meals per day and 2 healthy snacks per day     Increase physical activity   On track    Recommend increasing physical activity to at least 3 days per week        Depression Screen PHQ 2/9 Scores 02/26/2021 07/23/2020 02/07/2020 07/26/2019 07/19/2019 06/07/2019 02/06/2019  PHQ - 2 Score 0 0 0 0 0 0 0  PHQ- 9 Score - 0 - 0 0 0 -    Fall Risk Fall Risk  02/26/2021 02/07/2020 07/26/2019 07/19/2019 06/07/2019  Falls in the past year? 1 0 0 0 0  Comment - - - - -  Number falls in past yr: 0 0 - - -  Injury with Fall? 1 0 - - -  Comment - - - - -  Risk for fall due to : No Fall Risks No Fall Risks - No Fall Risks -  Follow up Falls prevention discussed Falls prevention discussed Falls evaluation completed Falls evaluation completed Falls evaluation completed    [place]:  Any stairs in or around the home? Yes  If so, are there any without handrails? No  Home free of loose throw rugs in walkways, pet beds, electrical cords, etc? Yes  Adequate lighting in your home to reduce risk of falls? Yes   ASSISTIVE DEVICES UTILIZED TO PREVENT FALLS:  Life alert? No  Use of a cane, walker or w/c? No  Grab bars in the bathroom? Yes  Shower chair or bench in shower? No  Elevated toilet seat or a handicapped toilet? No   TIMED UP AND  GO:  Was the test performed? No . Telephonic visit.   Cognitive Function: Normal cognitive status assessed by direct observation by this Nurse Health Advisor. No abnormalities found.       6CIT Screen 12/14/2016  What Year? 0 points  What month? 0 points  What time? 0 points  Count back from 20 0 points  Months in reverse 0 points  Repeat phrase 0 points  Total Score 0    Immunizations Immunization History  Administered Date(s) Administered   Fluad Quad(high Dose 65+) 11/14/2018, 11/21/2019   Influenza, High Dose Seasonal PF 12/14/2016, 12/15/2017   Influenza,inj,Quad PF,6+ Mos 10/22/2015   Moderna Sars-Covid-2 Vaccination 01/04/2020   PFIZER(Purple Top)SARS-COV-2 Vaccination 03/08/2019, 03/29/2019   Pneumococcal Conjugate-13 12/14/2016   Pneumococcal Polysaccharide-23 08/30/2014   Tdap 08/30/2014    TDAP status: Up to date  Flu Vaccine status: Due, Education has been provided regarding the importance of this vaccine. Advised may receive this vaccine at local pharmacy or Health Dept. Aware to provide a copy of the vaccination record if obtained from local pharmacy or Health Dept. Verbalized acceptance and understanding.  Pneumococcal vaccine status: Up to date  Covid-19 vaccine status: Completed vaccines  Qualifies for Shingles Vaccine? Yes   Zostavax completed No   Shingrix Completed?: No.    Education has been provided regarding the importance of this vaccine. Patient has been advised to call insurance company to determine out of pocket expense if they have not yet received this vaccine. Advised may also receive vaccine at local pharmacy or Health Dept. Verbalized acceptance and understanding.  Screening Tests Health Maintenance  Topic Date Due   Zoster Vaccines- Shingrix (1 of 2) Never done   COVID-19 Vaccine (4 - Booster) 02/29/2020   INFLUENZA VACCINE  08/26/2020   COLONOSCOPY (Pts 45-16yrs Insurance coverage will need to be confirmed)  10/12/2022   TETANUS/TDAP   08/29/2024   Pneumonia Vaccine 54+ Years old  Completed   Hepatitis C Screening  Completed   HPV VACCINES  Aged Out    Health Maintenance  Health Maintenance Due  Topic Date Due   Zoster Vaccines- Shingrix (1 of 2) Never done   COVID-19 Vaccine (4 - Booster) 02/29/2020   INFLUENZA VACCINE  08/26/2020    Colorectal cancer screening: Type of screening: Colonoscopy. Completed 10/12/19. Repeat every 3 years  Lung Cancer Screening: (Low Dose CT Chest recommended if Age 88-80 years, 30 pack-year currently smoking OR have quit w/in 15years.) does not qualify.   Additional Screening:  Hepatitis C Screening: does qualify; Completed 12/31/16  Vision Screening: Recommended annual ophthalmology exams for early detection of glaucoma and other disorders of the eye. Is the patient up to date with their annual eye exam?  Yes  Who is the provider or what is the name of the office in which the patient attends annual eye exams? [facility].   Dental Screening: Recommended annual dental exams for proper oral hygiene  Community Resource Referral / Chronic Care Management: CRR required this visit?  No   CCM required this visit?  No      Plan:     I have personally reviewed and noted the following in the patients chart:   Medical and social history Use of alcohol, tobacco or illicit drugs  Current medications and supplements including opioid prescriptions. Patient is not currently taking opioid prescriptions. Functional ability and status Nutritional status Physical activity Advanced directives List of other physicians Hospitalizations, surgeries, and ER visits in previous 12 months Vitals Screenings to include cognitive, depression, and falls Referrals and appointments  In addition, I have reviewed and discussed with patient certain preventive protocols, quality metrics, and best practice recommendations. A written personalized care plan for preventive services as well as  general preventive health recommendations were provided to patient.     [name], LPN   0/01/402   Nurse Notes: pt c/o increase in stiffness and soreness in lower legs within the last few weeks. Pt wears compression stockings daily but still having swelling in lower legs. Pt advised to discuss with PCP.

## 2021-02-28 ENCOUNTER — Inpatient Hospital Stay: Payer: Medicare Other

## 2021-03-05 ENCOUNTER — Inpatient Hospital Stay: Payer: Medicare Other | Attending: Oncology

## 2021-03-05 ENCOUNTER — Other Ambulatory Visit: Payer: Self-pay

## 2021-03-05 ENCOUNTER — Inpatient Hospital Stay: Payer: Medicare Other

## 2021-03-05 VITALS — BP 125/70 | HR 69

## 2021-03-05 DIAGNOSIS — D461 Refractory anemia with ring sideroblasts: Secondary | ICD-10-CM

## 2021-03-05 LAB — HEMOGLOBIN AND HEMATOCRIT, BLOOD
HCT: 27 % — ABNORMAL LOW (ref 39.0–52.0)
Hemoglobin: 8.7 g/dL — ABNORMAL LOW (ref 13.0–17.0)

## 2021-03-05 MED ORDER — EPOETIN ALFA-EPBX 10000 UNIT/ML IJ SOLN
30000.0000 [IU] | INTRAMUSCULAR | Status: DC
  Administered 2021-03-05: 30000 [IU] via SUBCUTANEOUS
  Filled 2021-03-05: qty 3

## 2021-03-14 ENCOUNTER — Inpatient Hospital Stay: Payer: Medicare Other

## 2021-03-19 ENCOUNTER — Other Ambulatory Visit: Payer: Self-pay

## 2021-03-19 ENCOUNTER — Inpatient Hospital Stay: Payer: Medicare Other

## 2021-03-19 VITALS — BP 131/76 | HR 71

## 2021-03-19 DIAGNOSIS — D461 Refractory anemia with ring sideroblasts: Secondary | ICD-10-CM

## 2021-03-19 LAB — HEMOGLOBIN AND HEMATOCRIT, BLOOD
HCT: 28.6 % — ABNORMAL LOW (ref 39.0–52.0)
Hemoglobin: 9.4 g/dL — ABNORMAL LOW (ref 13.0–17.0)

## 2021-03-19 MED ORDER — EPOETIN ALFA-EPBX 10000 UNIT/ML IJ SOLN
30000.0000 [IU] | INTRAMUSCULAR | Status: DC
  Administered 2021-03-19: 30000 [IU] via SUBCUTANEOUS
  Filled 2021-03-19: qty 3

## 2021-03-20 ENCOUNTER — Other Ambulatory Visit: Payer: Self-pay | Admitting: *Deleted

## 2021-03-20 DIAGNOSIS — Z87891 Personal history of nicotine dependence: Secondary | ICD-10-CM

## 2021-03-26 ENCOUNTER — Ambulatory Visit
Admission: RE | Admit: 2021-03-26 | Discharge: 2021-03-26 | Disposition: A | Discharge status: Home / Self Care | Payer: Medicare Other | Source: Ambulatory Visit | Attending: Acute Care | Admitting: Acute Care

## 2021-03-26 ENCOUNTER — Other Ambulatory Visit: Payer: Self-pay

## 2021-03-26 DIAGNOSIS — Z87891 Personal history of nicotine dependence: Secondary | ICD-10-CM | POA: Insufficient documentation

## 2021-03-26 IMAGING — CT CT CHEST LUNG CANCER SCREENING LOW DOSE W/O CM
1 of 2 series · 15 of 40 positions shown, 19 images · non-contrast
Comparison: CT lung cancer screening dated [DATE]

CLINICAL DATA: Former smoker with 30 pack-year history



[Series 2: axial st · axial · 0.78mm/px · z∈[-604,-299]mm · 15 of 67 slices shown, 19 images]
[im 3/67  mediastinal]
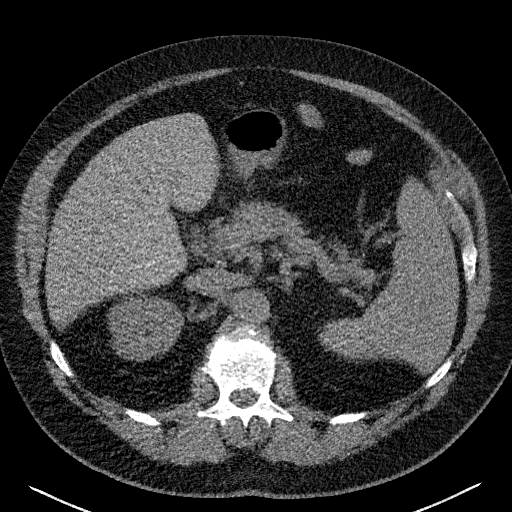
[im 3/67  lung]
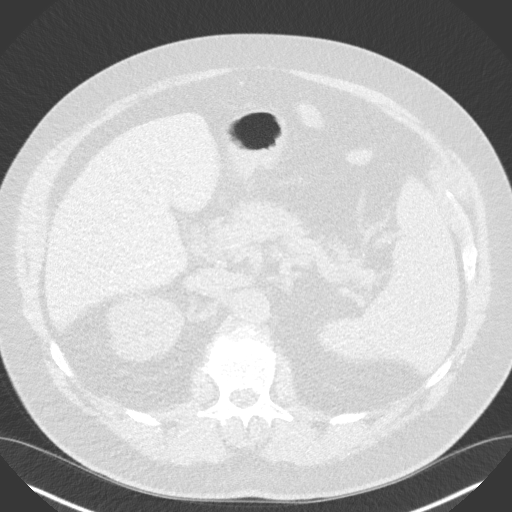
[im 8/67  lung]
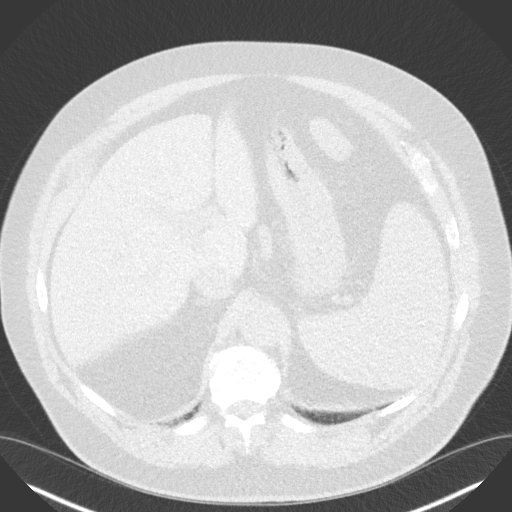
[im 13/67  lung]
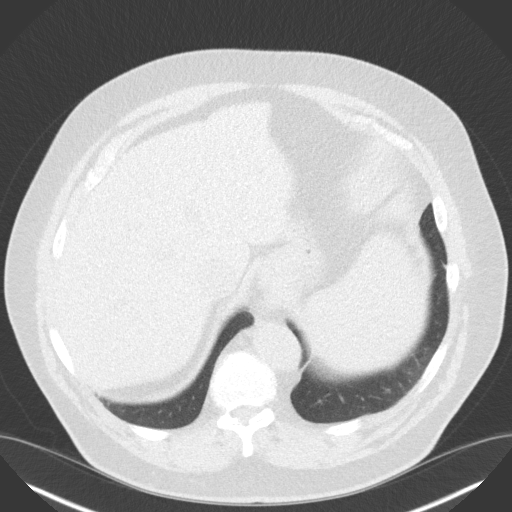
[im 17/67  lung]
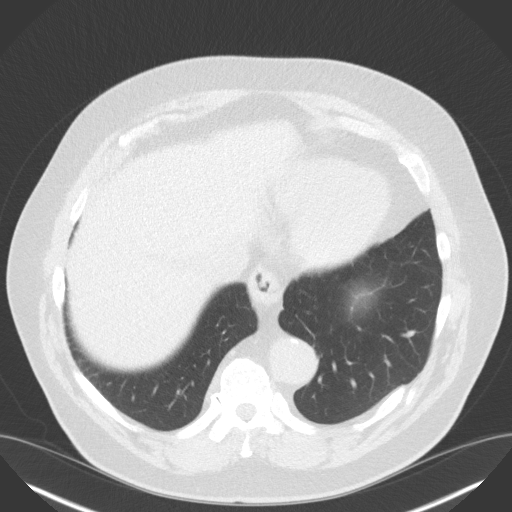
[im 21/67  mediastinal]
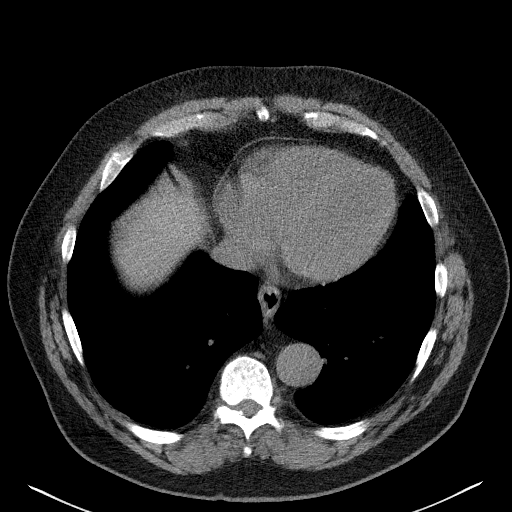
[im 21/67  lung]
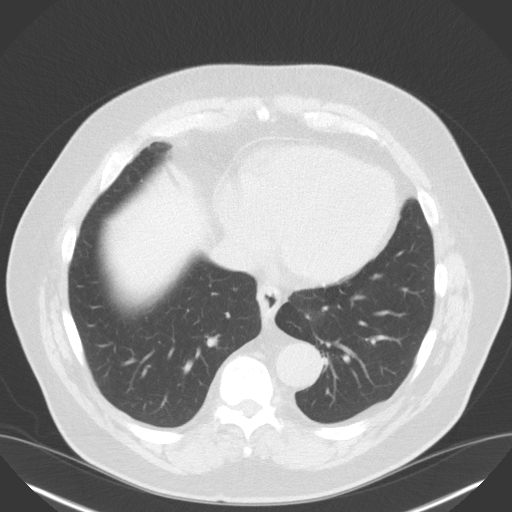
[im 26/67  lung]
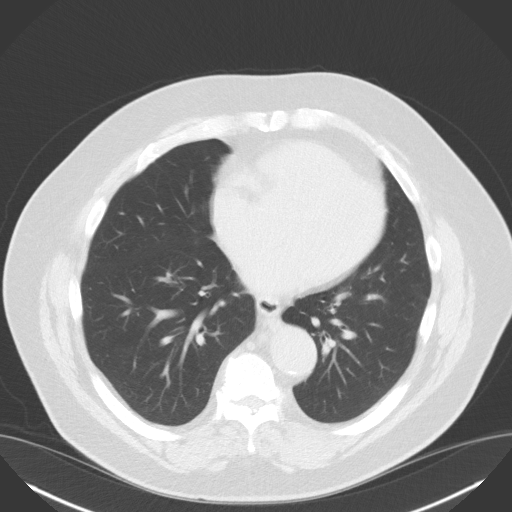
[im 31/67  lung]
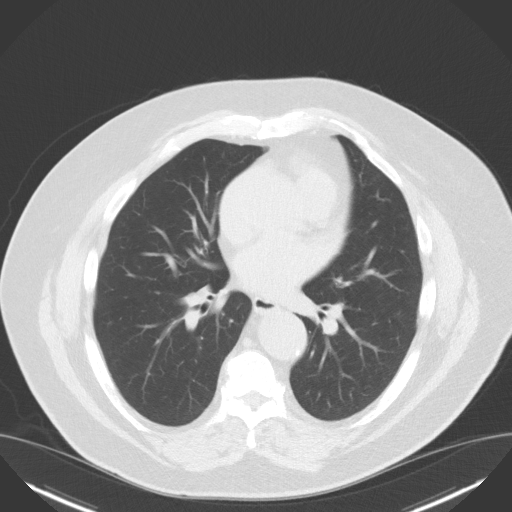
[im 34/67  lung]
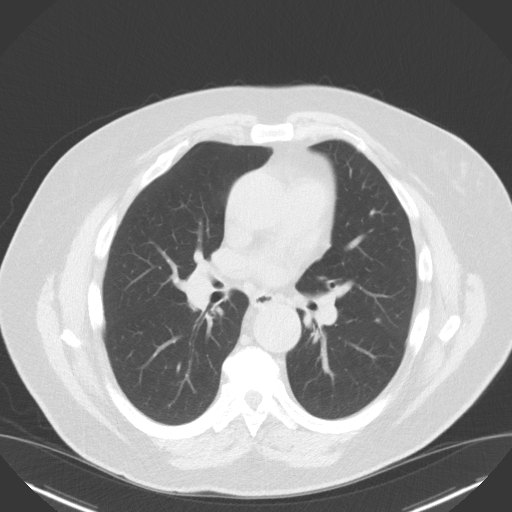
[im 36/67  mediastinal]
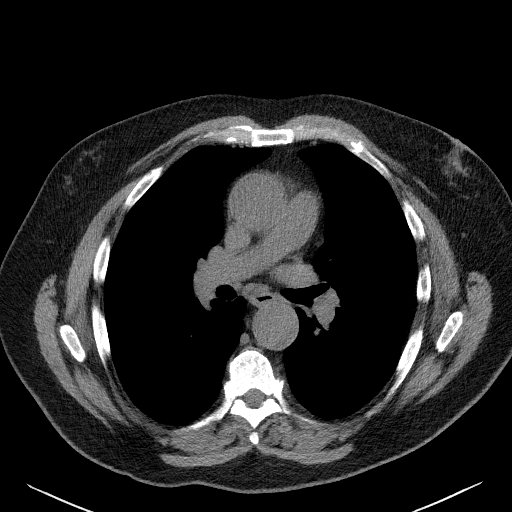
[im 36/67  lung]
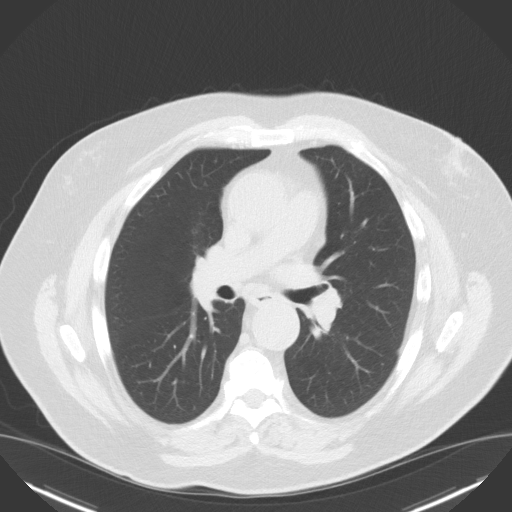
[im 41/67  lung]
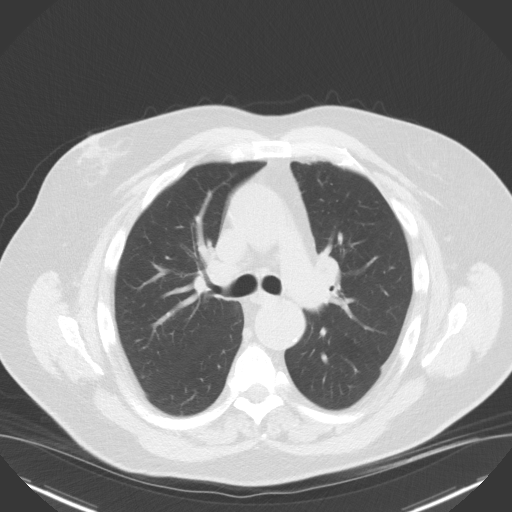
[im 46/67  lung]
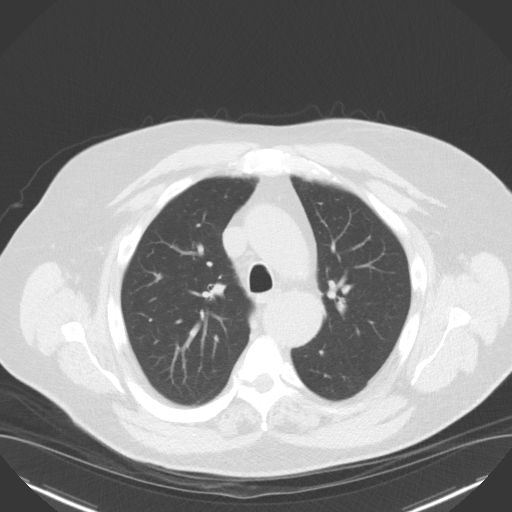
[im 50/67  lung]
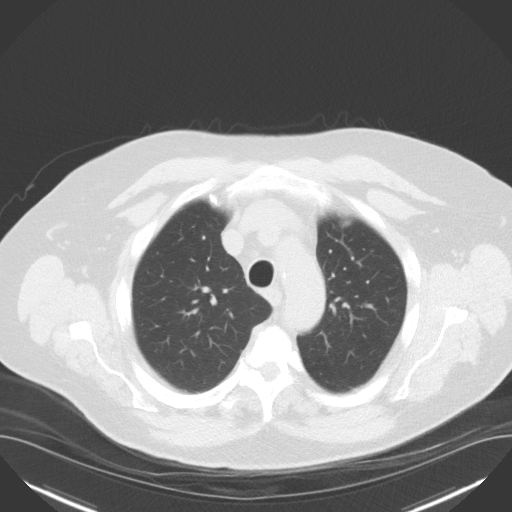
[im 54/67  mediastinal]
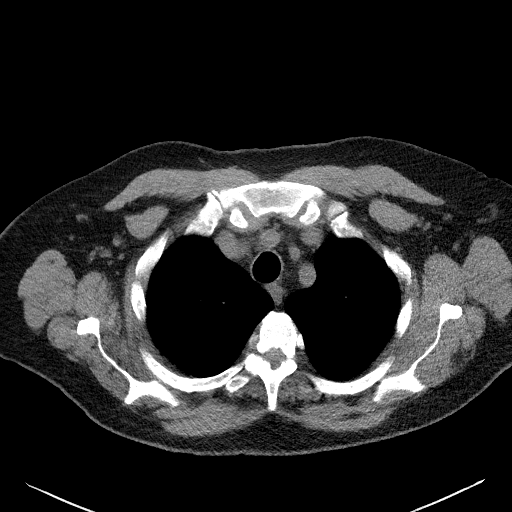
[im 54/67  lung]
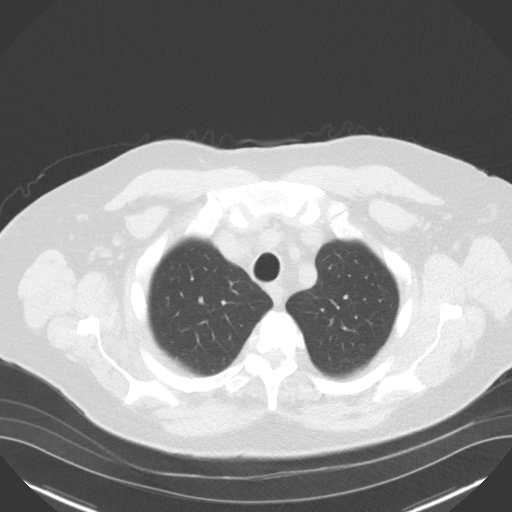
[im 59/67  lung]
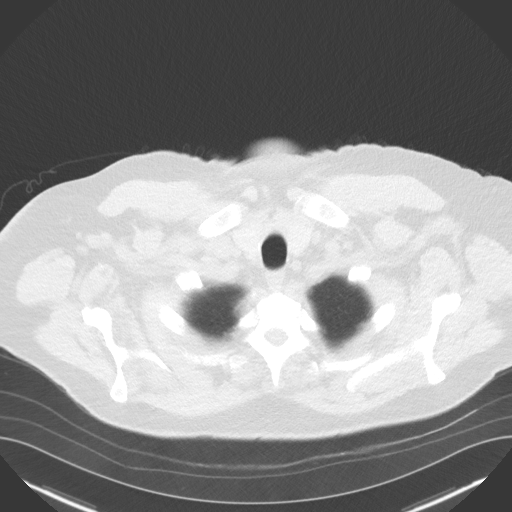
[im 64/67  lung]
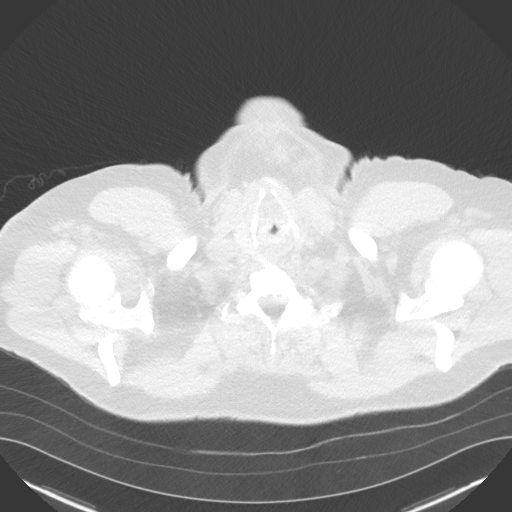

[15 of 40 positions shown; findings below may reference images not displayed]

FINDINGS: Cardiovascular: Normal heart size. No pericardial effusion. Dilated
ascending thoracic aorta, measuring up to 4.5 cm, unchanged compared
to prior exam. Atherosclerotic disease of the thoracic aorta.

Mediastinum/Nodes: Small hiatal hernia. Thyroid is unremarkable. No
pathologically enlarged lymph nodes seen in the chest.

Lungs/Pleura: Central airways are patent. No consolidation, pleural
effusion or pneumothorax. Stable small solid pulmonary nodules.
Reference nodule is located in the right lower lobe image 187 and
measures 5.1 mm in mean diameter.

Upper Abdomen: No acute abnormality.

Musculoskeletal: No chest wall mass or suspicious bone lesions
identified.
IMPRESSION: 1. Lung-RADS 2, benign appearance or behavior. Continue annual
screening with low-dose chest CT without contrast in 12 months.
2. Dilated ascending thoracic aortic aneurysm measuring up to
cm, unchanged when compared with prior exam. Recommend attention on
annual lung cancer screening CT.
3. Aortic Atherosclerosis ([3H]-[3H]) and Emphysema ([3H]-[3H]).

## 2021-03-28 ENCOUNTER — Ambulatory Visit: Payer: Medicare Other | Admitting: Oncology

## 2021-03-28 ENCOUNTER — Ambulatory Visit: Payer: Medicare Other

## 2021-03-28 ENCOUNTER — Other Ambulatory Visit: Payer: Self-pay

## 2021-03-28 ENCOUNTER — Other Ambulatory Visit: Payer: Medicare Other

## 2021-03-28 DIAGNOSIS — Z87891 Personal history of nicotine dependence: Secondary | ICD-10-CM

## 2021-04-01 ENCOUNTER — Other Ambulatory Visit: Payer: Self-pay | Admitting: *Deleted

## 2021-04-01 DIAGNOSIS — D539 Nutritional anemia, unspecified: Secondary | ICD-10-CM

## 2021-04-01 DIAGNOSIS — D461 Refractory anemia with ring sideroblasts: Secondary | ICD-10-CM

## 2021-04-02 ENCOUNTER — Inpatient Hospital Stay (HOSPITAL_BASED_OUTPATIENT_CLINIC_OR_DEPARTMENT_OTHER): Payer: Medicare Other | Admitting: Oncology

## 2021-04-02 ENCOUNTER — Other Ambulatory Visit: Payer: Self-pay

## 2021-04-02 ENCOUNTER — Encounter: Payer: Self-pay | Admitting: Oncology

## 2021-04-02 ENCOUNTER — Inpatient Hospital Stay: Payer: Medicare Other | Attending: Nurse Practitioner

## 2021-04-02 ENCOUNTER — Inpatient Hospital Stay: Payer: Medicare Other

## 2021-04-02 VITALS — BP 124/60 | HR 79 | Temp 98.6°F | Resp 18 | Wt 259.3 lb

## 2021-04-02 DIAGNOSIS — D461 Refractory anemia with ring sideroblasts: Secondary | ICD-10-CM

## 2021-04-02 DIAGNOSIS — D649 Anemia, unspecified: Secondary | ICD-10-CM

## 2021-04-02 DIAGNOSIS — D539 Nutritional anemia, unspecified: Secondary | ICD-10-CM

## 2021-04-02 DIAGNOSIS — Z79899 Other long term (current) drug therapy: Secondary | ICD-10-CM

## 2021-04-02 LAB — IRON AND TIBC
Iron: 144 ug/dL (ref 45–182)
Saturation Ratios: 61 % — ABNORMAL HIGH (ref 17.9–39.5)
TIBC: 237 ug/dL — ABNORMAL LOW (ref 250–450)
UIBC: 93 ug/dL

## 2021-04-02 LAB — CBC WITH DIFFERENTIAL/PLATELET
Abs Immature Granulocytes: 0.02 10*3/uL (ref 0.00–0.07)
Basophils Absolute: 0.1 10*3/uL (ref 0.0–0.1)
Basophils Relative: 1 %
Eosinophils Absolute: 0.1 10*3/uL (ref 0.0–0.5)
Eosinophils Relative: 2 %
HCT: 28.8 % — ABNORMAL LOW (ref 39.0–52.0)
Hemoglobin: 9.4 g/dL — ABNORMAL LOW (ref 13.0–17.0)
Immature Granulocytes: 0 %
Lymphocytes Relative: 20 %
Lymphs Abs: 1.5 10*3/uL (ref 0.7–4.0)
MCH: 34.2 pg — ABNORMAL HIGH (ref 26.0–34.0)
MCHC: 32.6 g/dL (ref 30.0–36.0)
MCV: 104.7 fL — ABNORMAL HIGH (ref 80.0–100.0)
Monocytes Absolute: 0.6 10*3/uL (ref 0.1–1.0)
Monocytes Relative: 8 %
Neutro Abs: 4.9 10*3/uL (ref 1.7–7.7)
Neutrophils Relative %: 69 %
Platelets: 290 10*3/uL (ref 150–400)
RBC: 2.75 MIL/uL — ABNORMAL LOW (ref 4.22–5.81)
RDW: 19.1 % — ABNORMAL HIGH (ref 11.5–15.5)
WBC: 7.1 10*3/uL (ref 4.0–10.5)
nRBC: 0.4 % — ABNORMAL HIGH (ref 0.0–0.2)

## 2021-04-02 LAB — COMPREHENSIVE METABOLIC PANEL
ALT: 13 U/L (ref 0–44)
AST: 20 U/L (ref 15–41)
Albumin: 3.9 g/dL (ref 3.5–5.0)
Alkaline Phosphatase: 42 U/L (ref 38–126)
Anion gap: 4 — ABNORMAL LOW (ref 5–15)
BUN: 24 mg/dL — ABNORMAL HIGH (ref 8–23)
CO2: 24 mmol/L (ref 22–32)
Calcium: 8.8 mg/dL — ABNORMAL LOW (ref 8.9–10.3)
Chloride: 105 mmol/L (ref 98–111)
Creatinine, Ser: 1.01 mg/dL (ref 0.61–1.24)
GFR, Estimated: >60 mL/min (ref >60)
Glucose, Bld: 122 mg/dL — ABNORMAL HIGH (ref 70–99)
Potassium: 4.1 mmol/L (ref 3.5–5.1)
Sodium: 133 mmol/L — ABNORMAL LOW (ref 135–145)
Total Bilirubin: 1.1 mg/dL (ref 0.3–1.2)
Total Protein: 6.6 g/dL (ref 6.5–8.1)

## 2021-04-02 LAB — FERRITIN: Ferritin: 722 ng/mL — ABNORMAL HIGH (ref 24–336)

## 2021-04-02 MED ORDER — EPOETIN ALFA-EPBX 40000 UNIT/ML IJ SOLN
40000.0000 [IU] | INTRAMUSCULAR | Status: AC
  Administered 2021-04-02: 40000 [IU] via SUBCUTANEOUS
  Filled 2021-04-02: qty 1

## 2021-04-02 NOTE — Progress Notes (Signed)
Pt will like to know If  there anything in a diet that will promote a healthy hemoglobin. ?

## 2021-04-09 ENCOUNTER — Other Ambulatory Visit: Payer: Self-pay | Admitting: *Deleted

## 2021-04-09 DIAGNOSIS — Z148 Genetic carrier of other disease: Secondary | ICD-10-CM

## 2021-04-09 DIAGNOSIS — R7989 Other specified abnormal findings of blood chemistry: Secondary | ICD-10-CM

## 2021-04-09 DIAGNOSIS — D461 Refractory anemia with ring sideroblasts: Secondary | ICD-10-CM

## 2021-04-13 ENCOUNTER — Encounter: Payer: Self-pay | Admitting: Oncology

## 2021-04-13 NOTE — Progress Notes (Signed)
? ? ? ?Hematology/Oncology Consult note ?[place]  ?[phone] [phone] ? ?Patient Care Team: ?[name], MD as PCP - General (Family Medicine) ?Sindy Guadeloupe, MD as Consulting Physician (Oncology)  ? ?Name of the patient: Brett Boone  ?[id number]  ?04/03/1945  ? ?Date of visit: 04/13/21 ? 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 ?Chief complaint/ Reason for visit-routine follow-up of MDS on Retacrit ? ?Heme/Onc history: Brett Boone is a 76 y.o. 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 Bone marrow biopsy on 09/11/2019 revealed macrocytic anemia and hypercellular bone marrow for age with dyspoietic changes primarily involving the erythroid cell lines associated with abundant ring sideroblasts (> 15%).  There was no increase in blasts. Flow cytometry was negative. Cytogenetics revealed 45,X,-Y [20].  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 IPSS score is low. ?  ?Baseline EPO level was 63.3 in August 2021.  Patient received Aranesp for a month in September 2021 and was then switched to [place].  He is presently on weekly Retacrit 20,000 units. ?  ?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 ?  ?MRI abdomen in September 2021 showed 1.3 cm lesion in the left kidney concerning for renal cell carcinoma for which he follows up with urology ?  ?  ? ?Interval history-overall he is doing well and other than mild fatigue he denies other complaints at this time. ? ?ECOG PS- 1 ?Pain scale- 0 ? ?Review of systems- Review of Systems  ?Constitutional:  Positive for malaise/fatigue. Negative for chills, fever and weight loss.  ?HENT:  Negative for congestion, ear discharge and nosebleeds.   ?Eyes:  Negative for blurred vision.  ?Respiratory:  Negative  for cough, hemoptysis, sputum production, shortness of breath and wheezing.   ?Cardiovascular:  Negative for chest pain, palpitations, orthopnea and claudication.  ?Gastrointestinal:  Negative for abdominal pain, blood in stool, constipation, diarrhea, heartburn, melena, nausea and vomiting.  ?Genitourinary:  Negative for dysuria, flank pain, frequency, hematuria and urgency.  ?Musculoskeletal:  Negative for back pain, joint pain and myalgias.  ?Skin:  Negative for rash.  ?Neurological:  Negative for dizziness, tingling, focal weakness, seizures, weakness and headaches.  ?Endo/Heme/Allergies:  Does not bruise/bleed easily.  ?Psychiatric/Behavioral:  Negative for depression and suicidal ideas. The patient does not have insomnia.    ? ? ?Allergies  ?Allergen Reactions  ? Amoxicillin-Pot Clavulanate Hives and Nausea And Vomiting  ? ? ? ?Past Medical History:  ?Diagnosis Date  ? Anemia   ? Heart murmur   ? Hydrocele   ? Rotator cuff tear arthropathy of right shoulder 08/01/2020  ? Venous stasis dermatitis of both lower extremities   ? ? ? ?Past Surgical History:  ?Procedure Laterality Date  ? CATARACT EXTRACTION W/PHACO Right 03/09/2017  ? Procedure: CATARACT EXTRACTION PHACO AND INTRAOCULAR LENS PLACEMENT ([place]) RIGHT;  Surgeon: Eulogio Bear, MD;  Location: [facility];  Service: Ophthalmology;  Laterality: Right;  ? COLONOSCOPY  2013  ? normal- cleared for 5 yrs- Dr Beckey Downing  ? COLONOSCOPY WITH PROPOFOL N/A 02/17/2017  ? Procedure: COLONOSCOPY WITH PROPOFOL;  Surgeon: Lin Landsman, MD;  Location: [facility];  Service: Endoscopy;  Laterality: N/A;  ? COLONOSCOPY WITH PROPOFOL N/A 10/12/2019  ? Procedure: COLONOSCOPY WITH BIOPSY;  Surgeon: Lin Landsman, MD;  Location: [facility];  Service: Endoscopy;  Laterality: N/A;  ? EPIGASTRIC HERNIA REPAIR N/A 07/06/2014  ? Procedure: HERNIA REPAIR EPIGASTRIC ADULT;  Surgeon: Molly Maduro, MD;  Location: [facility];  Service: General;   Laterality: N/A;  ? HERNIA REPAIR Bilateral   ? INSERTION OF MESH N/A 07/06/2014  ? Procedure: INSERTION OF MESH;  Surgeon: Molly Maduro, MD;  Location: [facility];  Service: General;  Laterality: N/A;  ? IR RADIOLOGIST EVAL & MGMT  11/07/2019  ? IR RADIOLOGIST EVAL & MGMT  04/25/2020  ? IR RADIOLOGIST EVAL & MGMT  12/05/2020  ? POLYPECTOMY  02/17/2017  ? Procedure: POLYPECTOMY;  Surgeon: Lin Landsman, MD;  Location: [facility];  Service: Endoscopy;;  ? POLYPECTOMY N/A 10/12/2019  ? Procedure: POLYPECTOMY;  Surgeon: Lin Landsman, MD;  Location: [facility];  Service: Endoscopy;  Laterality: N/A;  ? SHOULDER ARTHROSCOPY Left 2008  ? SINUS EXPLORATION    ? ? ?Social History  ? ?Socioeconomic History  ? Marital status: Married  ?  Spouse name: Not on file  ? Number of children: 3  ? Years of education: Not on file  ? Highest education level: Bachelor's degree (e.g., BA, AB, BS)  ?Occupational History  ? Occupation: Retired  ?Tobacco Use  ? Smoking status: Former  ?  Packs/day: 0.25  ?  Years: 50.00  ?  Pack years: 12.50  ?  Types: Cigarettes  ?  Quit date: 05/03/2018  ?  Years since quitting: 2.9  ? Smokeless tobacco: Former  ? Tobacco comments:  ?  pt quit smoking  ?Vaping Use  ? Vaping Use: Never used  ?Substance and Sexual Activity  ? Alcohol use: Yes  ?  Comment: Occasional -1x/month  ? Drug use: No  ? Sexual activity: Yes  ?Other Topics Concern  ? Not on file  ?Social History Narrative  ? Not on file  ? ?Social Determinants of Health  ? ?Financial Resource Strain: Low Risk   ? Difficulty of Paying Living Expenses: Not hard at all  ?Food Insecurity: No Food Insecurity  ? Worried About Charity fundraiser in the Last Year: Never true  ? Ran Out of Food in the Last Year: Never true  ?Transportation Needs: No Transportation Needs  ? Lack of Transportation (Medical): No  ? Lack of Transportation (Non-Medical): No  ?Physical Activity: Insufficiently Active  ? Days of Exercise per Week: 3 days   ? Minutes of Exercise per Session: 30 min  ?Stress: No Stress Concern Present  ? Feeling of Stress : Only a little  ?Social Connections: Moderately Integrated  ? Frequency of Communication with Friends and Family: More than three times a week  ? Frequency of Social Gatherings with Friends and Family: Once a week  ? Attends Religious Services: More than 4 times per year  ? Active Member of Clubs or Organizations: No  ? Attends Archivist Meetings: Never  ? Marital Status: Married  ?Intimate Partner Violence: Not At Risk  ? Fear of Current or Ex-Partner: No  ? Emotionally Abused: No  ? Physically Abused: No  ? Sexually Abused: No  ? ? ?Family History  ?Problem Relation Age of Onset  ? Diabetes Father   ? Heart disease Father 30  ? Stroke Mother   ? ? ? ?Current Outpatient Medications:  ?  acetaminophen (TYLENOL) 325 MG tablet, Take 650 mg by mouth every 6 (six) hours as needed., Disp: , Rfl:  ?  Calcium Carb-Cholecalciferol 600-400 MG-UNIT TABS, Take  by mouth., Disp: , Rfl:  ?  Epoetin Alfa-epbx (RETACRIT IJ), , Disp: , Rfl:  ?  tadalafil (CIALIS) 5 MG tablet, Take 1 tablet (5 mg total) by mouth daily., Disp: 30 tablet, Rfl: 11 ?No current facility-administered medications for this visit. ? ?Facility-Administered Medications Ordered in Other Visits:  ?  epoetin alfa-epbx (RETACRIT) injection 30,000 Units, 30,000 Units, Subcutaneous, Weekly, Sindy Guadeloupe, MD, 30,000 Units at 09/25/20 1113 ?  epoetin alfa-epbx (RETACRIT) injection 30,000 Units, 30,000 Units, Subcutaneous, Weekly, Sindy Guadeloupe, MD, 30,000 Units at 10/30/20 1443 ?  epoetin alfa-epbx (RETACRIT) injection 30,000 Units, 30,000 Units, Subcutaneous, Weekly, Sindy Guadeloupe, MD, 30,000 Units at 11/27/20 1044 ?  epoetin alfa-epbx (RETACRIT) injection 40,000 Units, 40,000 Units, Subcutaneous, Weekly, Sindy Guadeloupe, MD, 40,000 Units at 04/02/21 1523 ? ?Physical exam:  ?Vitals:  ? 04/02/21 1433  ?BP: 124/60  ?Pulse: 79  ?Resp: 18  ?Temp: 98.6 ?F (37  ?C)  ?SpO2: 99%  ?Weight: 259 lb 4.8 oz (117.6 kg)  ? ?Physical Exam ?Constitutional:   ?   General: He is not in acute distress. ?Cardiovascular:  ?   Rate and Rhythm: Normal rate and regular rhythm.

## 2021-04-16 ENCOUNTER — Inpatient Hospital Stay: Payer: Medicare Other

## 2021-04-16 ENCOUNTER — Other Ambulatory Visit: Payer: Self-pay

## 2021-04-16 VITALS — BP 126/73 | HR 72

## 2021-04-16 DIAGNOSIS — D461 Refractory anemia with ring sideroblasts: Secondary | ICD-10-CM | POA: Diagnosis not present

## 2021-04-16 DIAGNOSIS — D649 Anemia, unspecified: Secondary | ICD-10-CM

## 2021-04-16 LAB — HEMOGLOBIN AND HEMATOCRIT, BLOOD
HCT: 28.6 % — ABNORMAL LOW (ref 39.0–52.0)
Hemoglobin: 9.4 g/dL — ABNORMAL LOW (ref 13.0–17.0)

## 2021-04-16 MED ORDER — EPOETIN ALFA-EPBX 40000 UNIT/ML IJ SOLN
40000.0000 [IU] | INTRAMUSCULAR | Status: DC
  Administered 2021-04-16: 40000 [IU] via SUBCUTANEOUS
  Filled 2021-04-16: qty 1

## 2021-04-30 ENCOUNTER — Inpatient Hospital Stay: Payer: Medicare Other

## 2021-04-30 ENCOUNTER — Inpatient Hospital Stay: Payer: Medicare Other | Attending: Nurse Practitioner

## 2021-04-30 VITALS — BP 122/77 | HR 76

## 2021-04-30 DIAGNOSIS — D461 Refractory anemia with ring sideroblasts: Secondary | ICD-10-CM | POA: Diagnosis not present

## 2021-04-30 DIAGNOSIS — Z79899 Other long term (current) drug therapy: Secondary | ICD-10-CM

## 2021-04-30 LAB — HEMOGLOBIN AND HEMATOCRIT, BLOOD
HCT: 27.8 % — ABNORMAL LOW (ref 39.0–52.0)
Hemoglobin: 9.2 g/dL — ABNORMAL LOW (ref 13.0–17.0)

## 2021-04-30 MED ORDER — EPOETIN ALFA-EPBX 40000 UNIT/ML IJ SOLN
40000.0000 [IU] | INTRAMUSCULAR | Status: DC
  Administered 2021-04-30: 40000 [IU] via SUBCUTANEOUS
  Filled 2021-04-30: qty 1

## 2021-05-14 ENCOUNTER — Inpatient Hospital Stay: Payer: Medicare Other

## 2021-05-14 VITALS — BP 127/64 | HR 56

## 2021-05-14 DIAGNOSIS — D461 Refractory anemia with ring sideroblasts: Secondary | ICD-10-CM

## 2021-05-14 LAB — HEMOGLOBIN AND HEMATOCRIT, BLOOD
HCT: 28.9 % — ABNORMAL LOW (ref 39.0–52.0)
Hemoglobin: 9.5 g/dL — ABNORMAL LOW (ref 13.0–17.0)

## 2021-05-14 MED ORDER — EPOETIN ALFA-EPBX 40000 UNIT/ML IJ SOLN
40000.0000 [IU] | INTRAMUSCULAR | Status: DC
  Administered 2021-05-14: 40000 [IU] via SUBCUTANEOUS

## 2021-05-14 MED ORDER — EPOETIN ALFA-EPBX 40000 UNIT/ML IJ SOLN
INTRAMUSCULAR | Status: AC
  Filled 2021-05-14: qty 1

## 2021-05-28 ENCOUNTER — Inpatient Hospital Stay: Payer: Medicare Other | Attending: Nurse Practitioner

## 2021-05-28 ENCOUNTER — Inpatient Hospital Stay: Payer: Medicare Other

## 2021-05-28 VITALS — BP 120/71 | HR 59

## 2021-05-28 DIAGNOSIS — D461 Refractory anemia with ring sideroblasts: Secondary | ICD-10-CM | POA: Insufficient documentation

## 2021-05-28 LAB — HEMOGLOBIN AND HEMATOCRIT, BLOOD
HCT: 27.2 % — ABNORMAL LOW (ref 39.0–52.0)
Hemoglobin: 8.9 g/dL — ABNORMAL LOW (ref 13.0–17.0)

## 2021-05-28 MED ORDER — EPOETIN ALFA-EPBX 40000 UNIT/ML IJ SOLN
40000.0000 [IU] | INTRAMUSCULAR | Status: DC
  Administered 2021-05-28: 40000 [IU] via SUBCUTANEOUS
  Filled 2021-05-28: qty 1

## 2021-05-29 ENCOUNTER — Other Ambulatory Visit: Payer: Self-pay | Admitting: Interventional Radiology

## 2021-05-29 ENCOUNTER — Other Ambulatory Visit: Payer: Self-pay | Admitting: Urology

## 2021-05-29 DIAGNOSIS — N2889 Other specified disorders of kidney and ureter: Secondary | ICD-10-CM

## 2021-06-05 DIAGNOSIS — W1849XD Other slipping, tripping and stumbling without falling, subsequent encounter: Secondary | ICD-10-CM | POA: Diagnosis not present

## 2021-06-05 DIAGNOSIS — S46011D Strain of muscle(s) and tendon(s) of the rotator cuff of right shoulder, subsequent encounter: Secondary | ICD-10-CM | POA: Diagnosis not present

## 2021-06-05 DIAGNOSIS — Z9889 Other specified postprocedural states: Secondary | ICD-10-CM | POA: Diagnosis not present

## 2021-06-11 ENCOUNTER — Inpatient Hospital Stay: Payer: Medicare Other

## 2021-06-11 VITALS — BP 126/74 | HR 74

## 2021-06-11 DIAGNOSIS — D461 Refractory anemia with ring sideroblasts: Secondary | ICD-10-CM

## 2021-06-11 DIAGNOSIS — D539 Nutritional anemia, unspecified: Secondary | ICD-10-CM

## 2021-06-11 LAB — HEMOGLOBIN AND HEMATOCRIT, BLOOD
HCT: 27.4 % — ABNORMAL LOW (ref 39.0–52.0)
Hemoglobin: 8.9 g/dL — ABNORMAL LOW (ref 13.0–17.0)

## 2021-06-11 MED ORDER — EPOETIN ALFA-EPBX 40000 UNIT/ML IJ SOLN
40000.0000 [IU] | INTRAMUSCULAR | Status: DC
  Administered 2021-06-11: 40000 [IU] via SUBCUTANEOUS
  Filled 2021-06-11: qty 1

## 2021-06-25 ENCOUNTER — Inpatient Hospital Stay: Payer: Medicare Other

## 2021-06-25 VITALS — BP 119/62 | HR 58

## 2021-06-25 DIAGNOSIS — D539 Nutritional anemia, unspecified: Secondary | ICD-10-CM

## 2021-06-25 DIAGNOSIS — D461 Refractory anemia with ring sideroblasts: Secondary | ICD-10-CM | POA: Diagnosis not present

## 2021-06-25 LAB — HEMOGLOBIN AND HEMATOCRIT, BLOOD
HCT: 28.1 % — ABNORMAL LOW (ref 39.0–52.0)
Hemoglobin: 9.2 g/dL — ABNORMAL LOW (ref 13.0–17.0)

## 2021-06-25 MED ORDER — EPOETIN ALFA-EPBX 40000 UNIT/ML IJ SOLN
40000.0000 [IU] | INTRAMUSCULAR | Status: DC
  Administered 2021-06-25: 40000 [IU] via SUBCUTANEOUS
  Filled 2021-06-25: qty 1

## 2021-06-26 ENCOUNTER — Ambulatory Visit
Admission: RE | Admit: 2021-06-26 | Discharge: 2021-06-26 | Disposition: A | Discharge status: Home / Self Care | Payer: Medicare Other | Source: Ambulatory Visit | Attending: Urology | Admitting: Urology

## 2021-06-26 DIAGNOSIS — N2889 Other specified disorders of kidney and ureter: Secondary | ICD-10-CM

## 2021-06-26 DIAGNOSIS — K862 Cyst of pancreas: Secondary | ICD-10-CM | POA: Diagnosis not present

## 2021-06-26 DIAGNOSIS — K7689 Other specified diseases of liver: Secondary | ICD-10-CM | POA: Diagnosis not present

## 2021-06-26 DIAGNOSIS — N281 Cyst of kidney, acquired: Secondary | ICD-10-CM | POA: Diagnosis not present

## 2021-06-26 IMAGING — MR MR ABDOMEN WO/W CM
16 series · 48 of 48 positions shown · IV contrast (20 mL Multihance)
Comparison: Multiple priors including most recent MRI abdomen
[DATE]

CLINICAL DATA: Follow-up left renal mass, active surveillance.

EXAM:
MRI ABDOMEN WITHOUT AND WITH CONTRAST
TECHNIQUE: Multiplanar multisequence MR imaging of the abdomen was performed
both before and after the administration of intravenous contrast.
CONTRAST:  20mL MULTIHANCE GADOBENATE DIMEGLUMINE 529 MG/ML IV SOLN

[Series 3: T2 · coronal · 6.0mm · 1.72mm/px · 3 of 38 slices shown (1 of 3)]
[im 1/38]
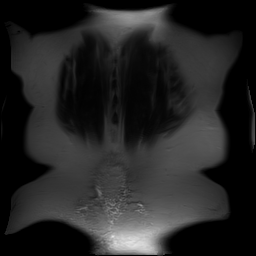
[im 19/38]
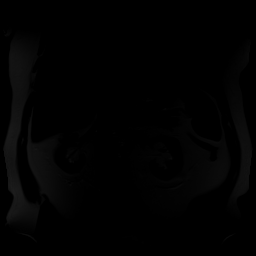
[im 38/38]
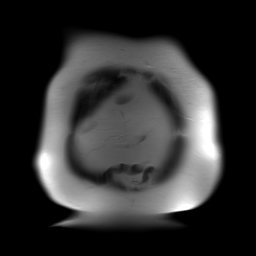

[Series 4: T1 · axial · 5.5mm · 0.86mm/px · z∈[-114,+130]mm · 3 of 76 slices shown]
[im 1/76]
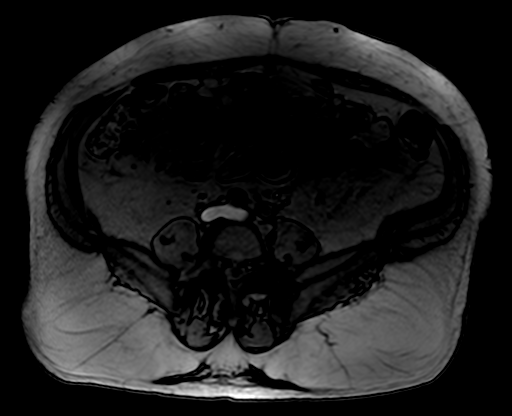
[im 38/76]
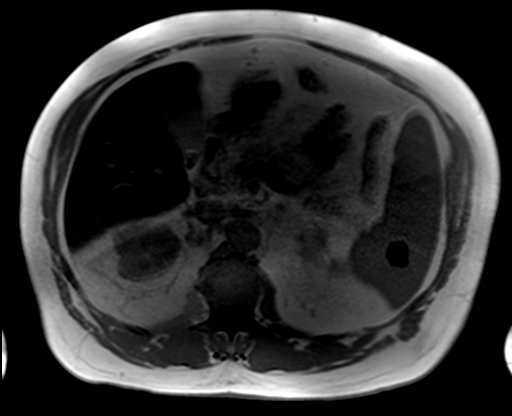
[im 76/76]
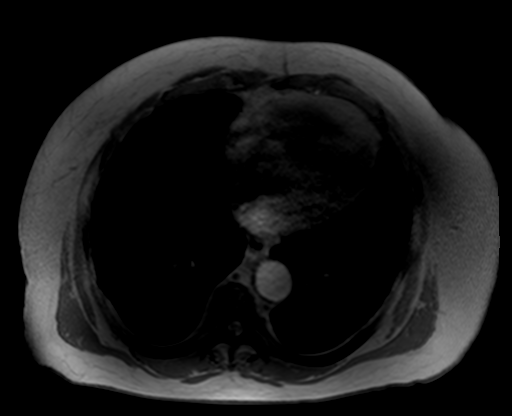

[Series 5: T2 · axial · 6.0mm · 1.64mm/px · z∈[-103,+156]mm · 2 of 37 slices shown (2 of 3)]
[im 1/37]
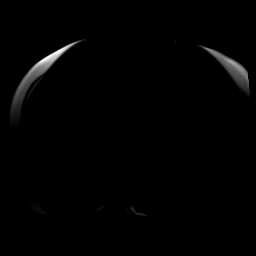
[im 37/37]
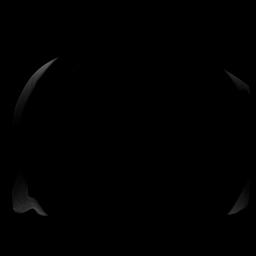

[Series 6: DWI · axial · 5.6mm · 1.64mm/px · z∈[-117,+145]mm · 5 of 120 slices shown (1 of 2)]
[im 1/120]
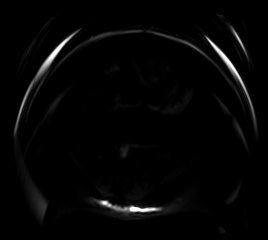
[im 30/120]
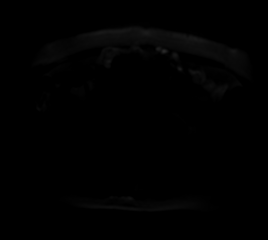
[im 60/120]
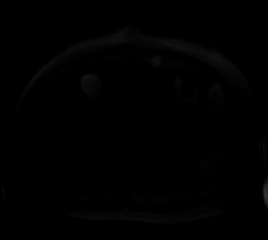
[im 90/120]
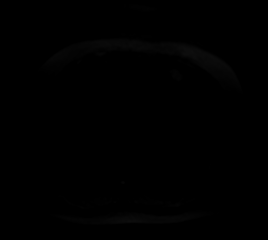
[im 120/120]
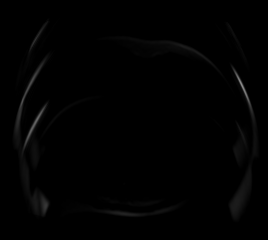

[Series 7: DWI · axial · 5.6mm · 1.64mm/px · z∈[-117,+145]mm · 2 of 40 slices shown (2 of 2)]
[im 1/40]
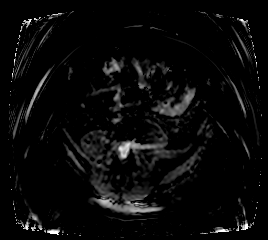
[im 40/40]
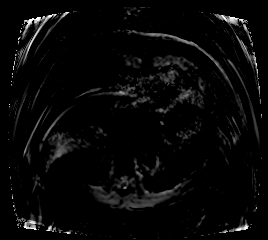

[Series 8: T2 · axial · 6.0mm · 1.38mm/px · z∈[-125,+134]mm · 2 of 37 slices shown (3 of 3)]
[im 1/37]
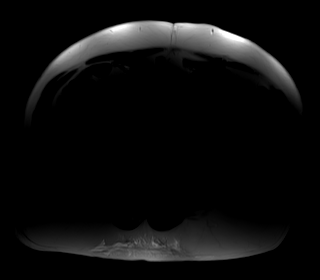
[im 37/37]
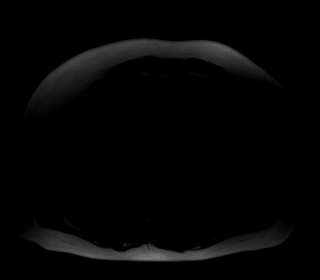

[Series 9: T1 dynamic · axial · non-contrast · 3.0mm · 1.38mm/px · z∈[-109,+128]mm · 3 of 80 slices shown]
[im 1/80]
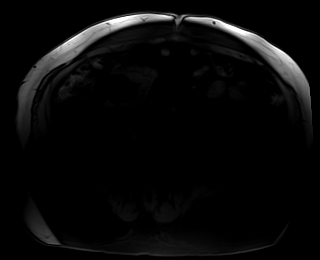
[im 40/80]
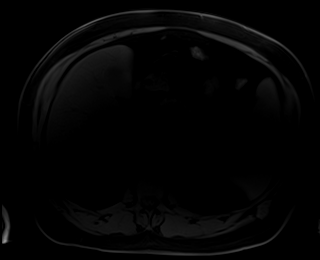
[im 80/80]
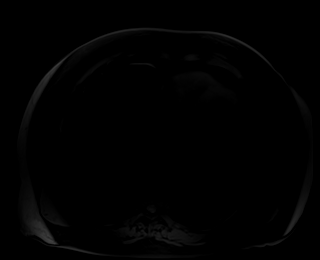

[Series 10: T1 dynamic post-contrast · axial · 3.0mm · 1.38mm/px · z∈[-109,+128]mm · 3 of 80 slices shown (1 of 9)]
[im 1/80]
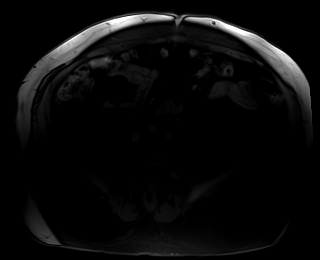
[im 40/80]
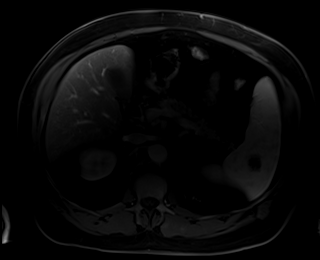
[im 80/80]
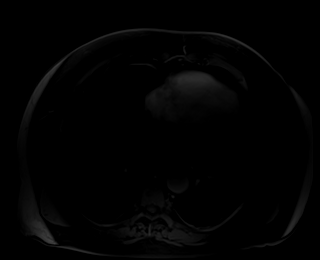

[Series 11: T1 dynamic post-contrast · axial · 3.0mm · 1.38mm/px · z∈[-109,+128]mm · 3 of 80 slices shown (2 of 9)]
[im 1/80]
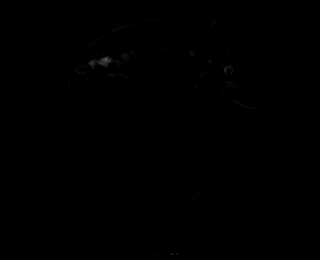
[im 40/80]
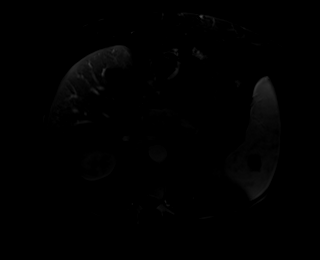
[im 80/80]
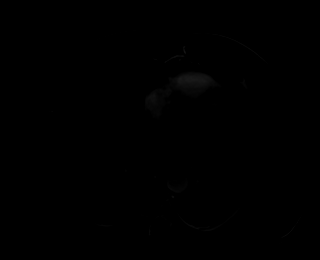

[Series 12: T1 dynamic post-contrast · axial · 3.0mm · 1.38mm/px · z∈[-109,+128]mm · 3 of 80 slices shown (3 of 9)]
[im 1/80]
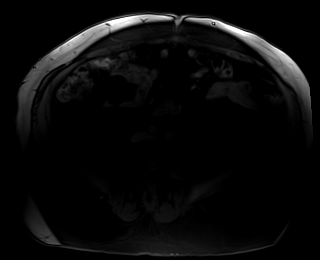
[im 40/80]
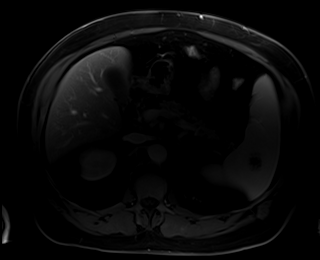
[im 80/80]
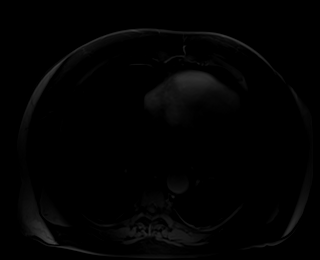

[Series 13: T1 dynamic post-contrast · axial · 3.0mm · 1.38mm/px · z∈[-109,+128]mm · 3 of 80 slices shown (4 of 9)]
[im 1/80]
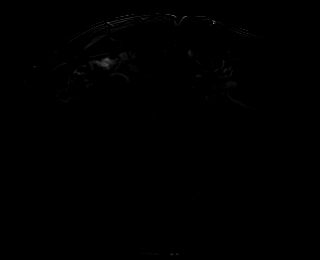
[im 40/80]
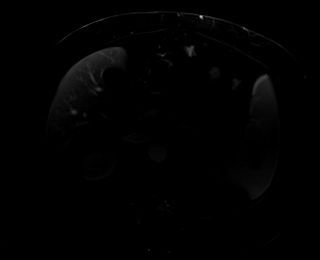
[im 80/80]
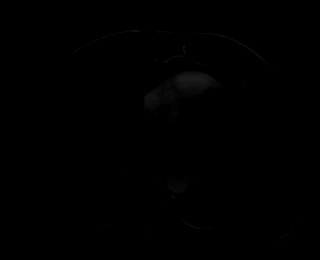

[Series 14: T1 dynamic post-contrast · axial · 3.0mm · 1.38mm/px · z∈[-109,+128]mm · 3 of 80 slices shown (5 of 9)]
[im 1/80]
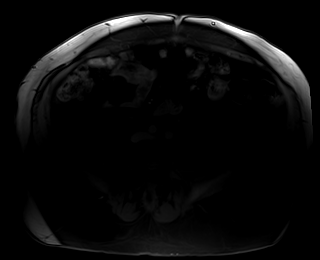
[im 40/80]
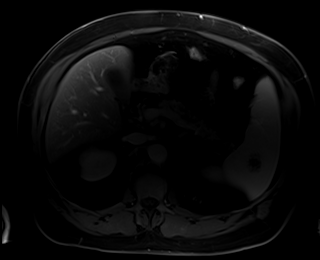
[im 80/80]
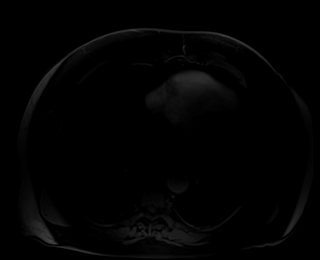

[Series 15: T1 dynamic post-contrast · axial · 3.0mm · 1.38mm/px · z∈[-109,+128]mm · 3 of 80 slices shown (6 of 9)]
[im 1/80]
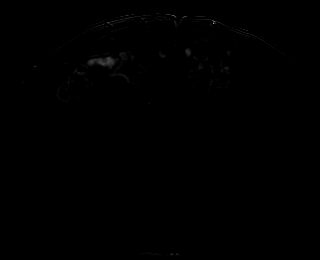
[im 40/80]
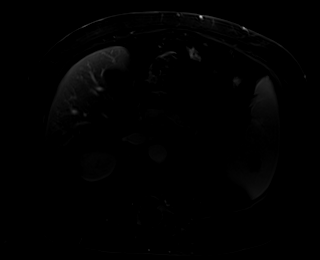
[im 80/80]
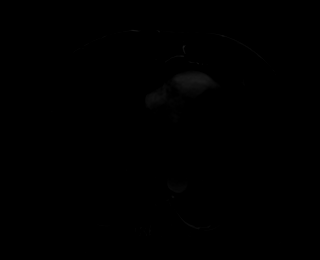

[Series 16: T1 dynamic post-contrast · coronal · 3.0mm · 1.38mm/px · 4 of 88 slices shown (7 of 9)]
[im 1/88]
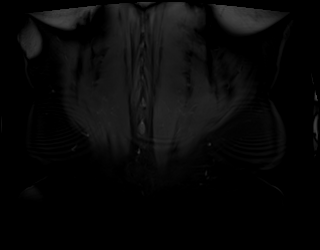
[im 30/88]
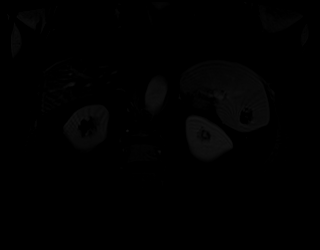
[im 59/88]
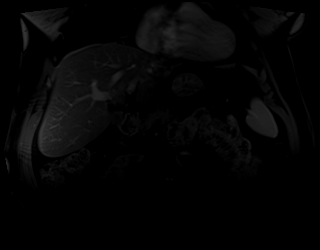
[im 88/88]
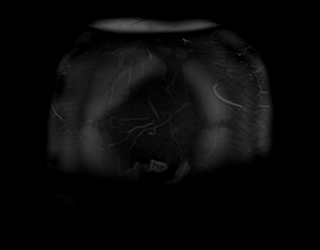

[Series 17: T1 dynamic post-contrast · axial · 3.0mm · 1.38mm/px · z∈[-109,+128]mm · 3 of 80 slices shown (8 of 9)]
[im 1/80]
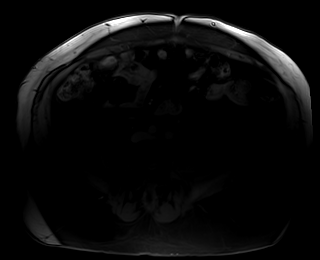
[im 40/80]
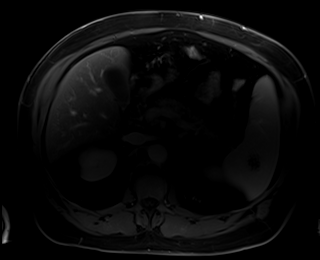
[im 80/80]
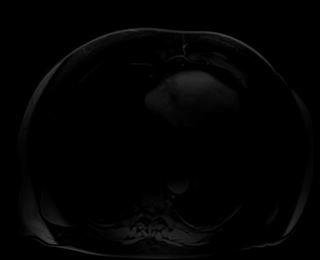

[Series 18: T1 dynamic post-contrast · axial · 3.0mm · 1.38mm/px · z∈[-109,+128]mm · 3 of 80 slices shown (9 of 9)]
[im 1/80]
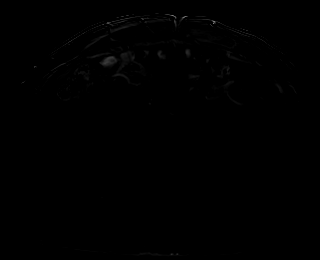
[im 40/80]
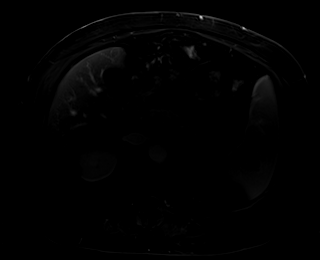
[im 80/80]
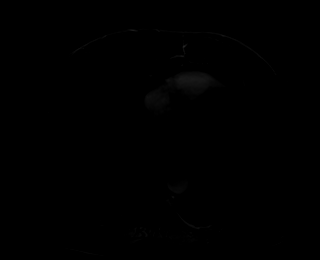

[48 of 48 positions shown; findings below may reference images not displayed]

FINDINGS: Lower chest: No acute abnormality.  Small hiatal hernia.

Hepatobiliary: Hepatic iron deposition. No suspicious hepatic
lesion. Gallbladder is unremarkable. No biliary ductal dilation.

Pancreas: Stable 8 mm cystic lesion in the head of the pancreas on
image [DATE] without suspicious MRI features. No pancreatic ductal
dilation.

Spleen: Stable enhancing 2.4 cm splenic lesion which is benign and
likely reflects a hemangioma.

Adrenals/Urinary Tract:  Bilateral adrenal glands are normal.

Heterogeneously enhancing lesion in the upper/interpolar left kidney
measures 1.7 cm on image 53/10, unchanged from prior.

Stable 1.9 cm exophytic left lower pole hemorrhagic/proteinaceous
renal cysts measuring 1.9 cm on image 71/9.

Benign 11 mm Bosniak classification 1 left interpolar renal cyst.

Stomach/Bowel: Visualized portions within the abdomen are
unremarkable.

Vascular/Lymphatic: No pathologically enlarged lymph nodes
identified. No abdominal aortic aneurysm demonstrated. Patent renal
veins.

Other:  None.

Musculoskeletal: No suspicious bone lesions identified.
IMPRESSION: 1. Stable 1.7 cm heterogeneously enhancing lesion in the
upper/interpolar left kidney, again suspicious for renal cell
carcinoma.
2. Stable benign Bosniak classification 1 and 2 renal cysts
including a hemorrhagic/proteinaceous cyst on the left.
3. Stable 8 mm cystic lesion in the head of the pancreas without
suspicious MRI features, likely reflecting a pseudocyst or side
branch IPMN. Continued attention on follow-up active surveillance in
this patient is sufficient. Otherwise, recommend follow up pre and
post contrast MRI/MRCP in 2 years. This recommendation follows ACR
consensus guidelines: Management of Incidental Pancreatic Cysts: A
White Paper of the ACR Incidental Findings Committee. [HOSPITAL] [PF];[DATE].
4. Hepatic iron deposition suggestive of hemosiderosis.

## 2021-06-26 MED ORDER — GADOBENATE DIMEGLUMINE 529 MG/ML IV SOLN
20.0000 mL | Freq: Once | INTRAVENOUS | Status: AC | PRN
  Administered 2021-06-26: 20 mL via INTRAVENOUS

## 2021-06-30 ENCOUNTER — Ambulatory Visit
Admission: RE | Admit: 2021-06-30 | Discharge: 2021-06-30 | Disposition: A | Discharge status: Home / Self Care | Payer: Medicare Other | Source: Ambulatory Visit | Attending: Interventional Radiology | Admitting: Interventional Radiology

## 2021-06-30 DIAGNOSIS — N2889 Other specified disorders of kidney and ureter: Secondary | ICD-10-CM

## 2021-06-30 DIAGNOSIS — K8689 Other specified diseases of pancreas: Secondary | ICD-10-CM | POA: Diagnosis not present

## 2021-06-30 HISTORY — PX: IR RADIOLOGIST EVAL & MGMT: IMG5224

## 2021-06-30 NOTE — Progress Notes (Signed)
Chief Complaint: Patient was consulted remotely today (TeleHealth) for follow up of a left renal mass.  History of Present Illness: Brett Boone is a 76 y.o. male seen previously for opinion regarding possible ablation of a left anterior renal mass.  This mass showed little change between MRI studies on 10/16/2019, 04/12/2020 and 10/17/2020 and decision was made to continue MRI surveillance. He continues to have no urinary or abdominal complaints.  A follow-up MRI was performed on 06/26/2021.  Past Medical History:  Diagnosis Date   Anemia    Heart murmur    Hydrocele    Rotator cuff tear arthropathy of right shoulder 08/01/2020   Venous stasis dermatitis of both lower extremities     Past Surgical History:  Procedure Laterality Date   CATARACT EXTRACTION W/PHACO Right 03/09/2017   Procedure: CATARACT EXTRACTION PHACO AND INTRAOCULAR LENS PLACEMENT ([place]) RIGHT;  Surgeon: Eulogio Bear, MD;  Location: [place];  Service: Ophthalmology;  Laterality: Right;   COLONOSCOPY  2013   normal- cleared for 5 yrs- Dr Beckey Downing   COLONOSCOPY WITH PROPOFOL N/A 02/17/2017   Procedure: COLONOSCOPY WITH PROPOFOL;  Surgeon: Lin Landsman, MD;  Location: [place];  Service: Endoscopy;  Laterality: N/A;   COLONOSCOPY WITH PROPOFOL N/A 10/12/2019   Procedure: COLONOSCOPY WITH BIOPSY;  Surgeon: Lin Landsman, MD;  Location: [place];  Service: Endoscopy;  Laterality: N/A;   EPIGASTRIC HERNIA REPAIR N/A 07/06/2014   Procedure: HERNIA REPAIR EPIGASTRIC ADULT;  Surgeon: Molly Maduro, MD;  Location: [facility];  Service: General;  Laterality: N/A;   HERNIA REPAIR Bilateral    INSERTION OF MESH N/A 07/06/2014   Procedure: INSERTION OF MESH;  Surgeon: Molly Maduro, MD;  Location: [facility];  Service: General;  Laterality: N/A;   IR RADIOLOGIST EVAL & MGMT  11/07/2019   IR RADIOLOGIST EVAL & MGMT  04/25/2020   IR RADIOLOGIST EVAL & MGMT  12/05/2020    POLYPECTOMY  02/17/2017   Procedure: POLYPECTOMY;  Surgeon: Lin Landsman, MD;  Location: [place];  Service: Endoscopy;;   POLYPECTOMY N/A 10/12/2019   Procedure: POLYPECTOMY;  Surgeon: Lin Landsman, MD;  Location: [place];  Service: Endoscopy;  Laterality: N/A;   SHOULDER ARTHROSCOPY Left 2008   SINUS EXPLORATION      Allergies: Amoxicillin-pot clavulanate  Medications: Prior to Admission medications   Medication Sig Start Date End Date Taking? Authorizing Provider  acetaminophen (TYLENOL) 325 MG tablet Take 650 mg by mouth every 6 (six) hours as needed.    [provider]  Calcium Carb-Cholecalciferol 600-400 MG-UNIT TABS Take by mouth.    [provider]  Epoetin Alfa-epbx (RETACRIT IJ)     [provider]  tadalafil (CIALIS) 5 MG tablet Take 1 tablet (5 mg total) by mouth daily. 04/30/20   Billey Co, MD     Family History  Problem Relation Age of Onset   Diabetes Father    Heart disease Father 21   Stroke Mother     Social History   Socioeconomic History   Marital status: Married    Spouse name: Not on file   Number of children: 3   Years of education: Not on file   Highest education level: Bachelor's degree (e.g., BA, AB, BS)  Occupational History   Occupation: Retired  Tobacco Use   Smoking status: Former    Packs/day: 0.25    Years: 50.00    Pack years: 12.50  Types: Cigarettes    Quit date: 05/03/2018    Years since quitting: 3.1   Smokeless tobacco: Former   Tobacco comments:    pt quit smoking  Vaping Use   Vaping Use: Never used  Substance and Sexual Activity   Alcohol use: Yes    Comment: Occasional -1x/month   Drug use: No   Sexual activity: Yes  Other Topics Concern   Not on file  Social History Narrative   Not on file   Social Determinants of Health   Financial Resource Strain: Low Risk    Difficulty of Paying Living Expenses: Not hard at all  Food Insecurity: No Food  Insecurity   Worried About Charity fundraiser in the Last Year: Never true   [place] in the Last Year: Never true  Transportation Needs: No Transportation Needs   Lack of Transportation (Medical): No   Lack of Transportation (Non-Medical): No  Physical Activity: Insufficiently Active   Days of Exercise per Week: 3 days   Minutes of Exercise per Session: 30 min  Stress: No Stress Concern Present   Feeling of Stress : Only a little  Social Connections: Moderately Integrated   Frequency of Communication with Friends and Family: More than three times a week   Frequency of Social Gatherings with Friends and Family: Once a week   Attends Religious Services: More than 4 times per year   Active Member of [employer] or Organizations: No   Attends Archivist Meetings: Never   Marital Status: Married     Review of Systems  Constitutional: Negative.   Respiratory: Negative.    Cardiovascular: Negative.   Gastrointestinal: Negative.   Genitourinary: Negative.   Musculoskeletal: Negative.   Neurological: Negative.    Review of Systems: A 12 point ROS discussed and pertinent positives are indicated in the HPI above.  All other systems are negative.  Physical Exam No direct physical exam was performed (except for noted visual exam findings with Video Visits).   Vital Signs: There were no vitals taken for this visit.  Imaging: MR Abdomen W Wo Contrast  Result Date: 06/26/2021 CLINICAL DATA:  Follow-up left renal mass, active surveillance. EXAM: MRI ABDOMEN WITHOUT AND WITH CONTRAST TECHNIQUE: Multiplanar multisequence MR imaging of the abdomen was performed both before and after the administration of intravenous contrast. CONTRAST:  19m MULTIHANCE GADOBENATE DIMEGLUMINE 529 MG/ML IV SOLN COMPARISON:  Multiple priors including most recent MRI abdomen October 17, 2020 FINDINGS: Lower chest: No acute abnormality.  Small hiatal hernia. Hepatobiliary: Hepatic iron deposition. No  suspicious hepatic lesion. Gallbladder is unremarkable. No biliary ductal dilation. Pancreas: Stable 8 mm cystic lesion in the head of the pancreas on image 22/10 without suspicious MRI features. No pancreatic ductal dilation. Spleen: Stable enhancing 2.4 cm splenic lesion which is benign and likely reflects a hemangioma. Adrenals/Urinary Tract:  Bilateral adrenal glands are normal. Heterogeneously enhancing lesion in the upper/interpolar left kidney measures 1.7 cm on image 53/10, unchanged from prior. Stable 1.9 cm exophytic left lower pole hemorrhagic/proteinaceous renal cysts measuring 1.9 cm on image 71/9. Benign 11 mm Bosniak classification 1 left interpolar renal cyst. Stomach/Bowel: Visualized portions within the abdomen are unremarkable. Vascular/Lymphatic: No pathologically enlarged lymph nodes identified. No abdominal aortic aneurysm demonstrated. Patent renal veins. Other:  None. Musculoskeletal: No suspicious bone lesions identified. IMPRESSION: 1. Stable 1.7 cm heterogeneously enhancing lesion in the upper/interpolar left kidney, again suspicious for renal cell carcinoma. 2. Stable benign Bosniak classification 1 and 2 renal cysts  including a hemorrhagic/proteinaceous cyst on the left. 3. Stable 8 mm cystic lesion in the head of the pancreas without suspicious MRI features, likely reflecting a pseudocyst or side branch IPMN. Continued attention on follow-up active surveillance in this patient is sufficient. Otherwise, recommend follow up pre and post contrast MRI/MRCP in 2 years. This recommendation follows ACR consensus guidelines: Management of Incidental Pancreatic Cysts: A White Paper of the ACR Incidental Findings Committee. [place] 7517;00:[id number]. 4. Hepatic iron deposition suggestive of hemosiderosis. Electronically Signed   By: Dahlia Bailiff M.D.   On: 06/26/2021 15:16    Labs:  CBC: Recent Labs    09/25/20 1050 10/02/20 1104 10/30/20 1323 11/06/20 1057 01/03/21 1415  01/10/21 1303 04/02/21 1403 04/16/21 1320 05/14/21 1335 05/28/21 1342 06/11/21 1335 06/25/21 1339  WBC 5.8  --  6.7  --  7.4  --  7.1  --   --   --   --   --   HGB 9.7*   < > 9.3*   < > 9.3*   < > 9.4*   < > 9.5* 8.9* 8.9* 9.2*  HCT 29.5*   < > 28.8*   < > 28.8*   < > 28.8*   < > 28.9* 27.2* 27.4* 28.1*  PLT 311  --  296  --  296  --  290  --   --   --   --   --    < > = values in this interval not displayed.    COAGS: No results for input(s): INR, APTT in the last 8760 hours.  BMP: Recent Labs    08/21/20 1047 01/03/21 1415 04/02/21 1403  NA 135 136 133*  K 4.2 4.9 4.1  CL 103 103 105  CO2 '29 26 24  '$ GLUCOSE 136* 128* 122*  BUN 14 15 24*  CALCIUM 8.8* 9.0 8.8*  CREATININE 1.01 0.91 1.01  GFRNONAA >60 >60 >60    LIVER FUNCTION TESTS: Recent Labs    07/23/20 0922 08/21/20 1047 01/03/21 1415 04/02/21 1403  BILITOT 0.8 1.6* 1.2 1.1  AST '7 15 19 20  '$ ALT '12 11 13 13  '$ ALKPHOS 42* 47 42 42  PROT  --  6.8 6.3* 6.6  ALBUMIN 4.1 3.9 3.9 3.9     Assessment and Plan:  I spoke with Mr. Mannella over the phone.  I reviewed the follow-up MRI dated 06/26/2021.This demonstrates no significant change in the enhancing endophytic anterior interpolar left renal lesion with maximal diameter of approximately 1.7 cm.  Morphology appears identical to the prior studies. I recommended active surveillance with a follow-up MRI in 12 months.  The 8 mm cystic lesion within the head of the pancreas was stable by MRI.   Electronically Signed: Azzie Roup 06/30/2021, 11:24 AM    I spent a total of 10 Minutes in remote  clinical consultation, greater than 50% of which was counseling/coordinating care for a left renal mass.    Visit type: Audio only (telephone). Audio (no video) only due to patient's lack of internet/smartphone capability. Alternative for in-person consultation at [facility], [address], [place], [place]. This visit type was conducted due to national  recommendations for restrictions regarding the COVID-19 Pandemic (e.g. social distancing).  This format is felt to be most appropriate for this patient at this time.  All issues noted in this document were discussed and addressed.

## 2021-07-09 ENCOUNTER — Inpatient Hospital Stay: Payer: Medicare Other | Admitting: Oncology

## 2021-07-09 ENCOUNTER — Other Ambulatory Visit: Payer: Self-pay | Admitting: Urology

## 2021-07-09 ENCOUNTER — Inpatient Hospital Stay: Payer: Medicare Other | Attending: Nurse Practitioner

## 2021-07-09 ENCOUNTER — Encounter: Payer: Self-pay | Admitting: Oncology

## 2021-07-09 ENCOUNTER — Other Ambulatory Visit: Payer: Self-pay

## 2021-07-09 ENCOUNTER — Inpatient Hospital Stay: Payer: Medicare Other

## 2021-07-09 VITALS — BP 126/70 | HR 64 | Temp 98.3°F | Resp 20 | Wt 255.1 lb

## 2021-07-09 VITALS — BP 126/70

## 2021-07-09 DIAGNOSIS — D461 Refractory anemia with ring sideroblasts: Secondary | ICD-10-CM

## 2021-07-09 DIAGNOSIS — D649 Anemia, unspecified: Secondary | ICD-10-CM

## 2021-07-09 DIAGNOSIS — Z148 Genetic carrier of other disease: Secondary | ICD-10-CM

## 2021-07-09 DIAGNOSIS — R7989 Other specified abnormal findings of blood chemistry: Secondary | ICD-10-CM

## 2021-07-09 DIAGNOSIS — Z79899 Other long term (current) drug therapy: Secondary | ICD-10-CM | POA: Diagnosis not present

## 2021-07-09 LAB — CBC WITH DIFFERENTIAL/PLATELET
Abs Immature Granulocytes: 0.02 10*3/uL (ref 0.00–0.07)
Basophils Absolute: 0.1 10*3/uL (ref 0.0–0.1)
Basophils Relative: 1 %
Eosinophils Absolute: 0.1 10*3/uL (ref 0.0–0.5)
Eosinophils Relative: 1 %
HCT: 28 % — ABNORMAL LOW (ref 39.0–52.0)
Hemoglobin: 9.2 g/dL — ABNORMAL LOW (ref 13.0–17.0)
Immature Granulocytes: 0 %
Lymphocytes Relative: 18 %
Lymphs Abs: 1.3 10*3/uL (ref 0.7–4.0)
MCH: 35 pg — ABNORMAL HIGH (ref 26.0–34.0)
MCHC: 32.9 g/dL (ref 30.0–36.0)
MCV: 106.5 fL — ABNORMAL HIGH (ref 80.0–100.0)
Monocytes Absolute: 0.5 10*3/uL (ref 0.1–1.0)
Monocytes Relative: 7 %
Neutro Abs: 5.3 10*3/uL (ref 1.7–7.7)
Neutrophils Relative %: 73 %
Platelets: 321 10*3/uL (ref 150–400)
RBC: 2.63 MIL/uL — ABNORMAL LOW (ref 4.22–5.81)
RDW: 19.3 % — ABNORMAL HIGH (ref 11.5–15.5)
WBC: 7.2 10*3/uL (ref 4.0–10.5)
nRBC: 0.6 % — ABNORMAL HIGH (ref 0.0–0.2)

## 2021-07-09 LAB — IRON AND TIBC
Iron: 182 ug/dL (ref 45–182)
Saturation Ratios: 82 % — ABNORMAL HIGH (ref 17.9–39.5)
TIBC: 221 ug/dL — ABNORMAL LOW (ref 250–450)
UIBC: 39 ug/dL

## 2021-07-09 LAB — FERRITIN: Ferritin: 726 ng/mL — ABNORMAL HIGH (ref 24–336)

## 2021-07-09 MED ORDER — EPOETIN ALFA-EPBX 40000 UNIT/ML IJ SOLN
40000.0000 [IU] | INTRAMUSCULAR | Status: DC
  Administered 2021-07-09: 40000 [IU] via SUBCUTANEOUS
  Filled 2021-07-09: qty 1

## 2021-07-09 MED ORDER — EPOETIN ALFA-EPBX 10000 UNIT/ML IJ SOLN
10000.0000 [IU] | Freq: Once | INTRAMUSCULAR | Status: AC
  Administered 2021-07-09: 10000 [IU] via SUBCUTANEOUS
  Filled 2021-07-09: qty 1

## 2021-07-09 NOTE — Progress Notes (Signed)
Pt will like to discuss doage change on his RETACRIT does not feel any different due to dosage but will like an explanation on why numbers are not staying steady. Currently, on less medication this time around and not understanding his numbers.

## 2021-07-09 NOTE — Progress Notes (Signed)
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 Bone marrow biopsy on 09/11/2019 revealed macrocytic anemia and hypercellular bone marrow for age with dyspoietic changes primarily involving the erythroid cell lines associated with abundant ring sideroblasts (> 15%).  There was no increase in blasts. Flow cytometry was negative. Cytogenetics revealed 45,X,-Y [20].  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 IPSS score is low.   Baseline EPO level was 63.3 in August 2021.  Patient received Aranesp for a month in September 2021 and was then switched to [place].  He is presently on weekly Retacrit 20,000 units.   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   MRI abdomen in September 2021 showed 1.3 cm lesion in the left kidney concerning for renal cell carcinoma for which he follows up with urology  Interval history-patient has been getting Retacrit every 2 weeks and tolerating it well.  He gets occasional chills for a day after Retacrit which resolves on its own.  He is independent of his ADLs and reports no significant fatigue  ECOG PS- 1 Pain scale- 0   Review of systems- Review of Systems  Constitutional:  Negative for chills, fever, malaise/fatigue and weight loss.  HENT:  Negative for  congestion, ear discharge and nosebleeds.   Eyes:  Negative for blurred vision.  Respiratory:  Negative for cough, hemoptysis, sputum production, shortness of breath and wheezing.   Cardiovascular:  Negative for chest pain, palpitations, orthopnea and claudication.  Gastrointestinal:  Negative for abdominal pain, blood in stool, constipation, diarrhea, heartburn, melena, nausea and vomiting.  Genitourinary:  Negative for dysuria, flank pain, frequency, hematuria and urgency.  Musculoskeletal:  Negative for back pain, joint pain and myalgias.  Skin:  Negative for rash.  Neurological:  Negative for dizziness, tingling, focal weakness, seizures, weakness and headaches.  Endo/Heme/Allergies:  Does not bruise/bleed easily.  Psychiatric/Behavioral:  Negative for depression and suicidal ideas. The patient does not have insomnia.        Allergies  Allergen Reactions   Amoxicillin-Pot Clavulanate Hives and Nausea And Vomiting     Past Medical History:  Diagnosis Date   Anemia    Heart murmur    Hydrocele    Rotator cuff tear arthropathy of right shoulder 08/01/2020   Venous stasis dermatitis of both lower extremities      Past Surgical History:  Procedure Laterality Date   CATARACT EXTRACTION W/PHACO Right 03/09/2017   Procedure: CATARACT EXTRACTION PHACO AND INTRAOCULAR LENS PLACEMENT ([place]) RIGHT;  Surgeon: Eulogio Bear, MD;  Location: [place];  Service: Ophthalmology;  Laterality: Right;   COLONOSCOPY  2013   normal- cleared for 5 yrs- Dr Beckey Downing   COLONOSCOPY WITH PROPOFOL N/A 02/17/2017   Procedure: COLONOSCOPY WITH PROPOFOL;  Surgeon: Lin Landsman, MD;  Location: [place];  Service: Endoscopy;  Laterality: N/A;   COLONOSCOPY WITH PROPOFOL  N/A 10/12/2019   Procedure: COLONOSCOPY WITH BIOPSY;  Surgeon: Lin Landsman, MD;  Location: [place];  Service: Endoscopy;  Laterality: N/A;   EPIGASTRIC HERNIA REPAIR N/A 07/06/2014    Procedure: HERNIA REPAIR EPIGASTRIC ADULT;  Surgeon: Molly Maduro, MD;  Location: [facility];  Service: General;  Laterality: N/A;   HERNIA REPAIR Bilateral    INSERTION OF MESH N/A 07/06/2014   Procedure: INSERTION OF MESH;  Surgeon: Molly Maduro, MD;  Location: [facility];  Service: General;  Laterality: N/A;   IR RADIOLOGIST EVAL & MGMT  11/07/2019   IR RADIOLOGIST EVAL & MGMT  04/25/2020   IR RADIOLOGIST EVAL & MGMT  12/05/2020   IR RADIOLOGIST EVAL & MGMT  06/30/2021   POLYPECTOMY  02/17/2017   Procedure: POLYPECTOMY;  Surgeon: Lin Landsman, MD;  Location: [place];  Service: Endoscopy;;   POLYPECTOMY N/A 10/12/2019   Procedure: POLYPECTOMY;  Surgeon: Lin Landsman, MD;  Location: [place];  Service: Endoscopy;  Laterality: N/A;   SHOULDER ARTHROSCOPY Left 2008   SINUS EXPLORATION      Social History   Socioeconomic History   Marital status: Married    Spouse name: Not on file   Number of children: 3   Years of education: Not on file   Highest education level: Bachelor's degree (e.g., BA, AB, BS)  Occupational History   Occupation: Retired  Tobacco Use   Smoking status: Former    Packs/day: 0.25    Years: 50.00    Total pack years: 12.50    Types: Cigarettes    Quit date: 05/03/2018    Years since quitting: 3.1   Smokeless tobacco: Former   Tobacco comments:    pt quit smoking  Vaping Use   Vaping Use: Never used  Substance and Sexual Activity   Alcohol use: Yes    Comment: Occasional -1x/month   Drug use: No   Sexual activity: Yes  Other Topics Concern   Not on file  Social History Narrative   Not on file   Social Determinants of Health   Financial Resource Strain: Low Risk  (02/26/2021)   Overall Financial Resource Strain (CARDIA)    Difficulty of Paying Living Expenses: Not hard at all  Food Insecurity: No Food Insecurity (02/26/2021)   Hunger Vital Sign    Worried About Running Out of Food in the Last Year: Never true    [place] in the Last Year: Never true  Transportation Needs: No Transportation Needs (02/26/2021)   PRAPARE - Hydrologist (Medical): No    Lack of Transportation (Non-Medical): No  Physical Activity: Insufficiently Active (02/26/2021)   Exercise Vital Sign    Days of Exercise per Week: 3 days    Minutes of Exercise per Session: 30 min  Stress: No Stress Concern Present (02/26/2021)   [place]    Feeling of Stress : Only a little  Social Connections: Moderately Integrated (02/26/2021)   Social Connection and Isolation Panel [NHANES]    Frequency of Communication with Friends and Family: More than three times a week    Frequency of Social Gatherings with Friends and Family: Once a week    Attends Religious Services: More than 4 times per year    Active Member of [employer] or Organizations: No    Attends Archivist Meetings: Never    Marital Status: Married  Human resources officer Violence: Not At  Risk (02/26/2021)   Humiliation, Afraid, Rape, and Kick questionnaire    Fear of Current or Ex-Partner: No    Emotionally Abused: No    Physically Abused: No    Sexually Abused: No    Family History  Problem Relation Age of Onset   Diabetes Father    Heart disease Father 101   Stroke Mother      Current Outpatient Medications:    acetaminophen (TYLENOL) 325 MG tablet, Take 650 mg by mouth every 6 (six) hours as needed., Disp: , Rfl:    Calcium Carb-Cholecalciferol 600-400 MG-UNIT TABS, Take by mouth., Disp: , Rfl:    ibuprofen (ADVIL) 200 MG tablet, Take 200 mg by mouth every 6 (six) hours as needed., Disp: , Rfl:    Epoetin Alfa-epbx (RETACRIT IJ), , Disp: , Rfl:    tadalafil (CIALIS) 5 MG tablet, TAKE ONE TABLET BY MOUTH DAILY, Disp: 30 tablet, Rfl: 11 No current facility-administered medications for this visit.  Facility-Administered Medications Ordered in Other Visits:    epoetin alfa-epbx  (RETACRIT) injection 30,000 Units, 30,000 Units, Subcutaneous, Weekly, Sindy Guadeloupe, MD, 30,000 Units at 10/30/20 1443   epoetin alfa-epbx (RETACRIT) injection 30,000 Units, 30,000 Units, Subcutaneous, Weekly, Sindy Guadeloupe, MD, 30,000 Units at 11/27/20 1044   epoetin alfa-epbx (RETACRIT) injection 40,000 Units, 40,000 Units, Subcutaneous, Weekly, Sindy Guadeloupe, MD, 40,000 Units at 04/02/21 1523   epoetin alfa-epbx (RETACRIT) injection 40,000 Units, 40,000 Units, Subcutaneous, Weekly, Sindy Guadeloupe, MD, 40,000 Units at 07/09/21 1445  Physical exam:  Vitals:   07/09/21 1352  BP: 126/70  Pulse: 64  Resp: 20  Temp: 98.3 F (36.8 C)  SpO2: 97%  Weight: 255 lb 1.6 oz (115.7 kg)   Physical Exam Constitutional:      General: He is not in acute distress. Cardiovascular:     Rate and Rhythm: Normal rate and regular rhythm.     Heart sounds: Normal heart sounds.  Pulmonary:     Effort: Pulmonary effort is normal.  Skin:    General: Skin is warm and dry.  Neurological:     Mental Status: He is alert and oriented to person, place, and time.         Latest Ref Rng & Units 04/02/2021    2:03 PM  CMP  Glucose 70 - 99 mg/dL 122   BUN 8 - 23 mg/dL 24   Creatinine 0.61 - 1.24 mg/dL 1.01   Sodium 135 - 145 mmol/L 133   Potassium 3.5 - 5.1 mmol/L 4.1   Chloride 98 - 111 mmol/L 105   CO2 22 - 32 mmol/L 24   Calcium 8.9 - 10.3 mg/dL 8.8   Total Protein 6.5 - 8.1 g/dL 6.6   Total Bilirubin 0.3 - 1.2 mg/dL 1.1   Alkaline Phos 38 - 126 U/L 42   AST 15 - 41 U/L 20   ALT 0 - 44 U/L 13       Latest Ref Rng & Units 07/09/2021    1:26 PM  CBC  WBC 4.0 - 10.5 K/uL 7.2   Hemoglobin 13.0 - 17.0 g/dL 9.2   Hematocrit 39.0 - 52.0 % 28.0   Platelets 150 - 400 K/uL 321     No images are attached to the encounter.  IR Radiologist Eval & Mgmt  Result Date: 06/30/2021 Please refer to notes tab for details about interventional procedure. (Op Note)  MR Abdomen W Wo Contrast  Result Date:  06/26/2021 CLINICAL DATA:  Follow-up left renal  mass, active surveillance. EXAM: MRI ABDOMEN WITHOUT AND WITH CONTRAST TECHNIQUE: Multiplanar multisequence MR imaging of the abdomen was performed both before and after the administration of intravenous contrast. CONTRAST:  81m MULTIHANCE GADOBENATE DIMEGLUMINE 529 MG/ML IV SOLN COMPARISON:  Multiple priors including most recent MRI abdomen October 17, 2020 FINDINGS: Lower chest: No acute abnormality.  Small hiatal hernia. Hepatobiliary: Hepatic iron deposition. No suspicious hepatic lesion. Gallbladder is unremarkable. No biliary ductal dilation. Pancreas: Stable 8 mm cystic lesion in the head of the pancreas on image 22/10 without suspicious MRI features. No pancreatic ductal dilation. Spleen: Stable enhancing 2.4 cm splenic lesion which is benign and likely reflects a hemangioma. Adrenals/Urinary Tract:  Bilateral adrenal glands are normal. Heterogeneously enhancing lesion in the upper/interpolar left kidney measures 1.7 cm on image 53/10, unchanged from prior. Stable 1.9 cm exophytic left lower pole hemorrhagic/proteinaceous renal cysts measuring 1.9 cm on image 71/9. Benign 11 mm Bosniak classification 1 left interpolar renal cyst. Stomach/Bowel: Visualized portions within the abdomen are unremarkable. Vascular/Lymphatic: No pathologically enlarged lymph nodes identified. No abdominal aortic aneurysm demonstrated. Patent renal veins. Other:  None. Musculoskeletal: No suspicious bone lesions identified. IMPRESSION: 1. Stable 1.7 cm heterogeneously enhancing lesion in the upper/interpolar left kidney, again suspicious for renal cell carcinoma. 2. Stable benign Bosniak classification 1 and 2 renal cysts including a hemorrhagic/proteinaceous cyst on the left. 3. Stable 8 mm cystic lesion in the head of the pancreas without suspicious MRI features, likely reflecting a pseudocyst or side branch IPMN. Continued attention on follow-up active surveillance in this  patient is sufficient. Otherwise, recommend follow up pre and post contrast MRI/MRCP in 2 years. This recommendation follows ACR consensus guidelines: Management of Incidental Pancreatic Cysts: A White Paper of the ACR Incidental Findings Committee. [id number];[id number] 4. Hepatic iron deposition suggestive of hemosiderosis. Electronically Signed   By: JDahlia BailiffM.D.   On: 06/26/2021 15:16     Assessment and plan- Patient is a 76y.o. male with low risk MDS currently on Retacrit here for routine follow-up  Patient is currently on Retacrit 40,000 units every 2 weeks.Hemoglobin has been mostly in the nines but occasionally drops down to the eights.  Today it is 9.2 again.  I will increase his dose of Retacrit to 50,000 units every 2 weeks.  As long as hemoglobin is remaining in the nines he will continue to receive Retacrit.  If it consistently drifts down to the 870 consider switching him to Luspatercept at that time.  We will repeat CBC ferritin and iron studies in 3 months and I will see him thereafter.   Visit Diagnosis 1. Erythropoietin (EPO) stimulating agent anemia management patient   2. Refractory anemia with ring sideroblasts (HCC)      Dr. ARanda Evens MD, MPH [id number]/14/2023 2:59 PM

## 2021-07-11 DIAGNOSIS — H43813 Vitreous degeneration, bilateral: Secondary | ICD-10-CM | POA: Diagnosis not present

## 2021-07-23 ENCOUNTER — Inpatient Hospital Stay: Payer: Medicare Other

## 2021-07-23 ENCOUNTER — Other Ambulatory Visit: Payer: Self-pay

## 2021-07-23 VITALS — BP 115/76 | HR 67

## 2021-07-23 DIAGNOSIS — D461 Refractory anemia with ring sideroblasts: Secondary | ICD-10-CM

## 2021-07-23 LAB — HEMOGLOBIN AND HEMATOCRIT, BLOOD
HCT: 26.5 % — ABNORMAL LOW (ref 39.0–52.0)
Hemoglobin: 8.9 g/dL — ABNORMAL LOW (ref 13.0–17.0)

## 2021-07-23 MED ORDER — EPOETIN ALFA-EPBX 20000 UNIT/ML IJ SOLN: 50000.0000 [IU] | INTRAMUSCULAR | Status: DC

## 2021-07-23 MED ORDER — EPOETIN ALFA-EPBX 10000 UNIT/ML IJ SOLN
10000.0000 [IU] | Freq: Once | INTRAMUSCULAR | Status: AC
  Administered 2021-07-23: 10000 [IU] via SUBCUTANEOUS
  Filled 2021-07-23: qty 1

## 2021-07-23 MED ORDER — EPOETIN ALFA-EPBX 40000 UNIT/ML IJ SOLN
40000.0000 [IU] | Freq: Once | INTRAMUSCULAR | Status: AC
  Administered 2021-07-23: 40000 [IU] via SUBCUTANEOUS
  Filled 2021-07-23: qty 1

## 2021-07-25 ENCOUNTER — Ambulatory Visit (INDEPENDENT_AMBULATORY_CARE_PROVIDER_SITE_OTHER): Payer: Medicare Other | Admitting: Family Medicine

## 2021-07-25 ENCOUNTER — Encounter: Payer: Self-pay | Admitting: Family Medicine

## 2021-07-25 VITALS — BP 130/80 | HR 80 | Ht 70.0 in | Wt 254.0 lb

## 2021-07-25 DIAGNOSIS — R351 Nocturia: Secondary | ICD-10-CM

## 2021-07-25 DIAGNOSIS — E785 Hyperlipidemia, unspecified: Secondary | ICD-10-CM

## 2021-07-25 DIAGNOSIS — Z0000 Encounter for general adult medical examination without abnormal findings: Secondary | ICD-10-CM

## 2021-07-25 LAB — HEMOCCULT GUIAC POC 1CARD (OFFICE): Fecal Occult Blood, POC: NEGATIVE

## 2021-07-25 NOTE — Progress Notes (Signed)
Date:  07/25/2021   Name:  Brett Boone   DOB:  [birth year]   MRN:  [id number]   Chief Complaint: Annual Exam  Patient is a 76 year old male who presents for a comprehensive physical exam. The patient reports the following problems: insomnia. Health maintenance has been reviewed up to date.      Lab Results  Component Value Date   NA 133 (L) 04/02/2021   K 4.1 04/02/2021   CO2 24 04/02/2021   GLUCOSE 122 (H) 04/02/2021   BUN 24 (H) 04/02/2021   CREATININE 1.01 04/02/2021   CALCIUM 8.8 (L) 04/02/2021   GFRNONAA >60 04/02/2021   Lab Results  Component Value Date   CHOL 61 (L) 07/23/2020   HDL 38 (L) 07/23/2020   LDLCALC 12 07/23/2020   TRIG 33 07/23/2020   CHOLHDL 1.4 01/04/2018   Lab Results  Component Value Date   TSH 1.275 08/15/2019   Lab Results  Component Value Date   HGBA1C 5.8 (H) 08/30/2014   Lab Results  Component Value Date   WBC 7.2 07/09/2021   HGB 8.9 (L) 07/23/2021   HCT 26.5 (L) 07/23/2021   MCV 106.5 (H) 07/09/2021   PLT 321 07/09/2021   Lab Results  Component Value Date   ALT 13 04/02/2021   AST 20 04/02/2021   ALKPHOS 42 04/02/2021   BILITOT 1.1 04/02/2021   No results found for: "25OHVITD2", "25OHVITD3", "VD25OH"   Review of Systems  Constitutional:  Negative for fatigue and unexpected weight change.  Respiratory: Negative.    Cardiovascular: Negative.   Gastrointestinal: Negative.   Genitourinary: Negative.   Neurological: Negative.     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   Macrocytic anemia 08/15/2019   Weight loss 08/15/2019   Elevated ferritin 08/15/2019   Erectile dysfunction 07/25/2019   Class 2 severe obesity due to excess calories with serious comorbidity and body mass index (BMI) of 36.0 to  36.9 in adult [facility]) 07/19/2019   History of adenomatous polyp of colon    Annual physical exam 08/30/2014   Epigastric hernia 07/18/2014    Allergies  Allergen Reactions   Amoxicillin-Pot Clavulanate Hives and Nausea And Vomiting    Past Surgical History:  Procedure Laterality Date   CATARACT EXTRACTION W/PHACO Right 03/09/2017   Procedure: CATARACT EXTRACTION PHACO AND INTRAOCULAR LENS PLACEMENT ([place]) RIGHT;  Surgeon: Eulogio Bear, MD;  Location: [place];  Service: Ophthalmology;  Laterality: Right;   COLONOSCOPY  2013   normal- cleared for 5 yrs- Dr Beckey Downing   COLONOSCOPY WITH PROPOFOL N/A 02/17/2017   Procedure: COLONOSCOPY WITH PROPOFOL;  Surgeon: Lin Landsman, MD;  Location: [place];  Service: Endoscopy;  Laterality: N/A;   COLONOSCOPY WITH PROPOFOL N/A 10/12/2019   Procedure: COLONOSCOPY WITH BIOPSY;  Surgeon: Lin Landsman, MD;  Location: [place];  Service: Endoscopy;  Laterality: N/A;   EPIGASTRIC HERNIA REPAIR N/A 07/06/2014   Procedure: HERNIA REPAIR EPIGASTRIC ADULT;  Surgeon: Molly Maduro, MD;  Location: [facility];  Service: General;  Laterality: N/A;   HERNIA REPAIR Bilateral    INSERTION OF MESH N/A 07/06/2014   Procedure: INSERTION OF MESH;  Surgeon: Molly Maduro, MD;  Location: [facility];  Service: General;  Laterality: N/A;   IR RADIOLOGIST  EVAL & MGMT  11/07/2019   IR RADIOLOGIST EVAL & MGMT  04/25/2020   IR RADIOLOGIST EVAL & MGMT  12/05/2020   IR RADIOLOGIST EVAL & MGMT  06/30/2021   POLYPECTOMY  02/17/2017   Procedure: POLYPECTOMY;  Surgeon: Lin Landsman, MD;  Location: [place];  Service: Endoscopy;;   POLYPECTOMY N/A 10/12/2019   Procedure: POLYPECTOMY;  Surgeon: Lin Landsman, MD;  Location: [place];  Service: Endoscopy;  Laterality: N/A;   SHOULDER ARTHROSCOPY Left 2008   SINUS EXPLORATION      Social History   Tobacco Use   Smoking status: Former    Packs/day:  0.25    Years: 50.00    Total pack years: 12.50    Types: Cigarettes    Quit date: 05/03/2018    Years since quitting: 3.2   Smokeless tobacco: Former   Tobacco comments:    pt quit smoking  Vaping Use   Vaping Use: Never used  Substance Use Topics   Alcohol use: Yes    Comment: Occasional -1x/month   Drug use: No     Medication list has been reviewed and updated.  Current Meds  Medication Sig   acetaminophen (TYLENOL) 325 MG tablet Take 650 mg by mouth every 6 (six) hours as needed.   Calcium Carb-Cholecalciferol 600-400 MG-UNIT TABS Take by mouth.   Epoetin Alfa-epbx (RETACRIT IJ)    ibuprofen (ADVIL) 200 MG tablet Take 200 mg by mouth every 6 (six) hours as needed.   tadalafil (CIALIS) 5 MG tablet TAKE ONE TABLET BY MOUTH DAILY       07/25/2021    8:24 AM 07/23/2020    8:47 AM 07/26/2019   10:42 AM 07/19/2019    8:37 AM  GAD 7 : Generalized Anxiety Score  Nervous, Anxious, on Edge 0 0 0 0  Control/stop worrying 0 0 0 0  Worry too much - different things 0 0 0 0  Trouble relaxing 0 0 0 0  Restless 0 0 0 0  Easily annoyed or irritable 0 0 0 0  Afraid - awful might happen 0 0 0 0  Total GAD 7 Score 0 0 0 0  Anxiety Difficulty Not difficult at all   Not difficult at all       07/25/2021    8:24 AM 02/26/2021    3:19 PM 07/23/2020    8:47 AM  Depression screen PHQ 2/9  Decreased Interest 0 0 0  Down, Depressed, Hopeless 0 0 0  PHQ - 2 Score 0 0 0  Altered sleeping 2  0  Tired, decreased energy 0  0  Change in appetite 0  0  Feeling bad or failure about yourself  0  0  Trouble concentrating 0  0  Moving slowly or fidgety/restless 0  0  Suicidal thoughts 0  0  PHQ-9 Score 2  0  Difficult doing work/chores Not difficult at all      BP Readings from Last 3 Encounters:  07/25/21 130/80  07/23/21 115/76  07/09/21 126/70    Physical Exam Vitals and nursing note reviewed.  Constitutional:      Appearance: He is obese.  HENT:     Head: Normocephalic.     Jaw:  There is normal jaw occlusion.     Right Ear: Tympanic membrane, ear canal and external ear normal. Decreased hearing noted.     Left Ear: Tympanic membrane, ear canal and external ear normal. Decreased hearing noted.  Nose: Nose normal. No congestion.     Mouth/Throat:     Lips: Pink.     Mouth: Mucous membranes are moist.     Dentition: No dental tenderness.     Tongue: No lesions.     Palate: No mass.     Pharynx: Oropharynx is clear. Uvula midline.     Tonsils: No tonsillar exudate or tonsillar abscesses.  Eyes:     General: Lids are normal. Vision grossly intact. Gaze aligned appropriately. No scleral icterus.       Right eye: No discharge.        Left eye: No discharge.     Extraocular Movements: Extraocular movements intact.     Conjunctiva/sclera: Conjunctivae normal.     Pupils: Pupils are equal, round, and reactive to light.     Funduscopic exam:    Right eye: Red reflex present.        Left eye: Red reflex present. Neck:     Thyroid: No thyroid mass, thyromegaly or thyroid tenderness.     Vascular: Normal carotid pulses. No carotid bruit, hepatojugular reflux or JVD.     Trachea: Trachea and phonation normal. No tracheal deviation.  Cardiovascular:     Rate and Rhythm: Normal rate and regular rhythm.     Chest Wall: PMI is not displaced.     Pulses: Normal pulses.          Carotid pulses are 2+ on the right side and 2+ on the left side.      Radial pulses are 2+ on the right side and 2+ on the left side.       Femoral pulses are 2+ on the right side and 2+ on the left side.      Popliteal pulses are 2+ on the right side and 2+ on the left side.       Dorsalis pedis pulses are 2+ on the right side and 2+ on the left side.       Posterior tibial pulses are 2+ on the right side and 2+ on the left side.     Heart sounds: Normal heart sounds, S1 normal and S2 normal. No murmur heard.    No systolic murmur is present.     No diastolic murmur is present.     No friction  rub. No gallop. No S3 or S4 sounds.  Pulmonary:     Effort: Pulmonary effort is normal. No respiratory distress.     Breath sounds: Normal breath sounds. No decreased breath sounds, wheezing, rhonchi or rales.  Chest:     Chest wall: No mass.  Breasts:    Breasts are symmetrical.     Right: Normal. No mass.     Left: Normal. No mass.  Abdominal:     General: Bowel sounds are normal.     Palpations: Abdomen is soft. There is no hepatomegaly, splenomegaly or mass.     Tenderness: There is no abdominal tenderness. There is no guarding or rebound.  Genitourinary:    Penis: Normal and circumcised.      Testes:        Right: Testicular hydrocele present. Mass not present.        Left: Testicular hydrocele present. Mass not present.  Musculoskeletal:        General: No tenderness. Normal range of motion.     Cervical back: Full passive range of motion without pain, normal range of motion and neck supple.     Right lower leg: No  edema.     Left lower leg: No edema.  Lymphadenopathy:     Head:     Right side of head: No submental, submandibular or tonsillar adenopathy.     Left side of head: No submental, submandibular or tonsillar adenopathy.     Cervical: No cervical adenopathy.     Right cervical: No superficial, deep or posterior cervical adenopathy.    Left cervical: No superficial, deep or posterior cervical adenopathy.     Upper Body:     Right upper body: No supraclavicular or axillary adenopathy.     Left upper body: No supraclavicular or axillary adenopathy.     Lower Body: No right inguinal adenopathy. No left inguinal adenopathy.  Skin:    General: Skin is warm.     Capillary Refill: Capillary refill takes less than 2 seconds.     Findings: No rash.  Neurological:     Mental Status: He is alert and oriented to person, place, and time.     Cranial Nerves: Cranial nerves 2-12 are intact. No cranial nerve deficit.     Sensory: Sensation is intact.     Motor: Motor function  is intact.     Coordination: Coordination is intact.     Deep Tendon Reflexes: Reflexes are normal and symmetric.     Reflex Scores:      Tricep reflexes are 2+ on the right side and 2+ on the left side.      Bicep reflexes are 2+ on the right side and 2+ on the left side.      Brachioradialis reflexes are 2+ on the right side and 2+ on the left side.      Patellar reflexes are 2+ on the right side and 2+ on the left side.      Achilles reflexes are 2+ on the right side and 2+ on the left side. Psychiatric:        Behavior: Behavior is cooperative.   Occasional nocturia we will check PSA.  DRE is normal without enlargement, nodularity, or change in consistency.  Wt Readings from Last 3 Encounters:  07/25/21 254 lb (115.2 kg)  07/09/21 255 lb 1.6 oz (115.7 kg)  04/02/21 259 lb 4.8 oz (117.6 kg)    BP 130/80   Pulse 80   Ht '5\' 10"'$  (1.778 m)   Wt 254 lb (115.2 kg)   BMI 36.45 kg/m   Assessment and Plan:  [name] is a 76 y.o. male who presents today for his Complete Annual Exam. He feels well. He reports exercising minimally. He reports he is sleeping poorly/using over-the-counter..   1. Annual physical exam Immunizations are reviewed and recommendations provided.   Age appropriate screening tests are discussed. Counseling given for risk factor reduction interventions.  No subjective/objective concerns noted during history of present illness, review of systems, and physical exam. - Lipid Panel With LDL/HDL Ratio - PSA - POCT Occult Blood Stool  2. Mild hyperlipidemia Mild elevation in cholesterol in the past we will recheck lipid panel for current status of LDL - Lipid Panel With LDL/HDL Ratio  3. Nocturia Patient has occasional nocturia.  DRE is normal without enlargement, nodularity, or change in consistency.  We will check PSA for current level - PSA

## 2021-07-26 LAB — LIPID PANEL WITH LDL/HDL RATIO
Cholesterol, Total: 62 mg/dL — ABNORMAL LOW (ref 100–199)
HDL: 47 mg/dL (ref >39)
LDL Chol Calc (NIH): 3 mg/dL (ref 0–99)
LDL/HDL Ratio: 0.1 ratio (ref 0.0–3.6)
Triglycerides: 34 mg/dL (ref 0–149)
VLDL Cholesterol Cal: 12 mg/dL (ref 5–40)

## 2021-07-26 LAB — PSA: Prostate Specific Ag, Serum: 4.7 ng/mL — ABNORMAL HIGH (ref 0.0–4.0)

## 2021-07-28 ENCOUNTER — Other Ambulatory Visit: Payer: Self-pay

## 2021-07-28 DIAGNOSIS — R972 Elevated prostate specific antigen [PSA]: Secondary | ICD-10-CM

## 2021-08-05 ENCOUNTER — Ambulatory Visit: Payer: Medicare Other | Admitting: Urology

## 2021-08-05 ENCOUNTER — Other Ambulatory Visit
Admission: RE | Admit: 2021-08-05 | Discharge: 2021-08-05 | Disposition: A | Discharge status: Home / Self Care | Payer: Medicare Other | Attending: Urology | Admitting: Urology

## 2021-08-05 VITALS — BP 138/72 | HR 86 | Wt 250.0 lb

## 2021-08-05 DIAGNOSIS — N529 Male erectile dysfunction, unspecified: Secondary | ICD-10-CM

## 2021-08-05 DIAGNOSIS — R351 Nocturia: Secondary | ICD-10-CM

## 2021-08-05 DIAGNOSIS — R972 Elevated prostate specific antigen [PSA]: Secondary | ICD-10-CM

## 2021-08-05 DIAGNOSIS — N2889 Other specified disorders of kidney and ureter: Secondary | ICD-10-CM | POA: Diagnosis not present

## 2021-08-05 DIAGNOSIS — R399 Unspecified symptoms and signs involving the genitourinary system: Secondary | ICD-10-CM

## 2021-08-05 DIAGNOSIS — R350 Frequency of micturition: Secondary | ICD-10-CM

## 2021-08-05 LAB — URINALYSIS, COMPLETE (UACMP) WITH MICROSCOPIC
Bacteria, UA: NONE SEEN
Bilirubin Urine: NEGATIVE
Glucose, UA: NEGATIVE mg/dL
Ketones, ur: NEGATIVE mg/dL
Leukocytes,Ua: NEGATIVE
Nitrite: NEGATIVE
Protein, ur: NEGATIVE mg/dL
RBC / HPF: NONE SEEN RBC/hpf (ref 0–5)
Specific Gravity, Urine: >1.03 — ABNORMAL HIGH (ref 1.005–1.030)
Squamous Epithelial / HPF: NONE SEEN (ref 0–5)
pH: 5 (ref 5.0–8.0)

## 2021-08-05 NOTE — Patient Instructions (Signed)

## 2021-08-05 NOTE — Progress Notes (Signed)
   08/05/2021 9:59 AM   Brett Boone 10/06/1945 [id number]  Reason for visit: Follow up left small renal mass, right hydrocele, ED, new urinary frequency/nocturia, elevated PSA  HPI: 76 year old male I have been following for the above issues.  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  He was seen by Dr. Kathlene Cote in interventional radiology, and he felt this would be very challenging to ablate percutaneously and recommended active surveillance as well.  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  We again reviewed options including attempted percutaneous ablation, partial nephrectomy, or active surveillance.  We reviewed the AUA guidelines regarding renal masses, and I recommended continuing active surveillance with his stable lesion, with repeat MRI in 1 year.   Regarding his erectile dysfunction, he is getting good results with Cialis 5 mg on demand.  We discussed he can take a boost dose as needed, and the max dose would be 20 mg daily.  He denies any changes in the right hydrocele, and we again reviewed options including observation or hydrocelectomy, and he would like to continue observation at this time.  He recently had a PSA with PCP on 07/25/2021 that was 4.7 which had increased from 3.1 three years ago.  DRE was normal with PCP recently.  We reviewed the AUA guidelines that do not recommend routine screening in men over age 75, and that a normal PSA for men in his mid 41s would be <6.  We discussed options including repeat PSA, prostate MRI, or prostate biopsy, and he was amenable to a repeat PSA in 6 months which I think is very reasonable.  Finally, he reports some increased urinary frequency overnight, he attributes this to not sleeping as well.  He has had  baseline nocturia 1-2 times per night long-term, but is getting up as often as 3 times now.  I recommended a urinalysis and culture today to rule out UTI or microscopic hematuria.  -Continue Cialis for ED -Urinalysis and culture today, call with results -Behavioral strategies discussed regarding urinary symptoms and nocturia -Repeat PSA in 6 months, call with results -RTC 1 year MRI for surveillance of left small renal mass  Billey Co, MD  [facility] [address], [place], [place] [address] [phone]

## 2021-08-06 ENCOUNTER — Inpatient Hospital Stay: Payer: Medicare Other

## 2021-08-06 ENCOUNTER — Inpatient Hospital Stay: Payer: Medicare Other | Attending: Nurse Practitioner

## 2021-08-06 ENCOUNTER — Ambulatory Visit: Payer: Medicare Other

## 2021-08-06 VITALS — BP 119/68 | HR 61

## 2021-08-06 DIAGNOSIS — D539 Nutritional anemia, unspecified: Secondary | ICD-10-CM

## 2021-08-06 DIAGNOSIS — R7989 Other specified abnormal findings of blood chemistry: Secondary | ICD-10-CM

## 2021-08-06 DIAGNOSIS — D461 Refractory anemia with ring sideroblasts: Secondary | ICD-10-CM | POA: Diagnosis not present

## 2021-08-06 LAB — URINE CULTURE: Culture: NO GROWTH

## 2021-08-06 LAB — HEMOGLOBIN AND HEMATOCRIT, BLOOD
HCT: 28.5 % — ABNORMAL LOW (ref 39.0–52.0)
Hemoglobin: 9 g/dL — ABNORMAL LOW (ref 13.0–17.0)

## 2021-08-06 MED ORDER — EPOETIN ALFA-EPBX 40000 UNIT/ML IJ SOLN
40000.0000 [IU] | Freq: Once | INTRAMUSCULAR | Status: AC
  Administered 2021-08-06: 40000 [IU] via SUBCUTANEOUS
  Filled 2021-08-06: qty 1

## 2021-08-06 MED ORDER — EPOETIN ALFA-EPBX 10000 UNIT/ML IJ SOLN
10000.0000 [IU] | Freq: Once | INTRAMUSCULAR | Status: AC
  Administered 2021-08-06: 10000 [IU] via SUBCUTANEOUS
  Filled 2021-08-06: qty 1

## 2021-08-06 MED ORDER — EPOETIN ALFA-EPBX 20000 UNIT/ML IJ SOLN: 50000.0000 [IU] | INTRAMUSCULAR | Status: DC

## 2021-08-07 DIAGNOSIS — H2512 Age-related nuclear cataract, left eye: Secondary | ICD-10-CM | POA: Diagnosis not present

## 2021-08-18 ENCOUNTER — Encounter: Payer: Self-pay | Admitting: Ophthalmology

## 2021-08-19 ENCOUNTER — Other Ambulatory Visit: Payer: Self-pay

## 2021-08-19 DIAGNOSIS — D461 Refractory anemia with ring sideroblasts: Secondary | ICD-10-CM

## 2021-08-20 ENCOUNTER — Inpatient Hospital Stay: Payer: Medicare Other

## 2021-08-20 ENCOUNTER — Ambulatory Visit: Payer: Medicare Other

## 2021-08-20 VITALS — BP 115/76 | HR 76

## 2021-08-20 DIAGNOSIS — D461 Refractory anemia with ring sideroblasts: Secondary | ICD-10-CM

## 2021-08-20 LAB — HEMOGLOBIN AND HEMATOCRIT, BLOOD
HCT: 27.1 % — ABNORMAL LOW (ref 39.0–52.0)
Hemoglobin: 8.9 g/dL — ABNORMAL LOW (ref 13.0–17.0)

## 2021-08-20 MED ORDER — EPOETIN ALFA-EPBX 40000 UNIT/ML IJ SOLN
40000.0000 [IU] | Freq: Once | INTRAMUSCULAR | Status: AC
  Administered 2021-08-20: 40000 [IU] via SUBCUTANEOUS
  Filled 2021-08-20: qty 1

## 2021-08-20 MED ORDER — EPOETIN ALFA-EPBX 10000 UNIT/ML IJ SOLN
10000.0000 [IU] | Freq: Once | INTRAMUSCULAR | Status: AC
  Administered 2021-08-20: 10000 [IU] via SUBCUTANEOUS
  Filled 2021-08-20: qty 1

## 2021-08-20 MED ORDER — EPOETIN ALFA-EPBX 20000 UNIT/ML IJ SOLN: 50000.0000 [IU] | INTRAMUSCULAR | Status: DC

## 2021-08-21 NOTE — Discharge Instructions (Signed)

## 2021-08-24 NOTE — Anesthesia Preprocedure Evaluation (Addendum)
Anesthesia Evaluation  Patient identified by MRN, date of birth, ID band Patient awake    Reviewed: Allergy & Precautions, NPO status , Patient's Chart, lab work & pertinent test results  Airway Mallampati: II  TM Distance: >3 FB Neck ROM: full    Dental no notable dental hx.    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: Rotator cuff tear arthropathy of right shoulder No date: Venous stasis dermatitis of both lower extremities  Past Surgical History: 03/09/2017: CATARACT EXTRACTION W/PHACO; Right     Comment:  Procedure: CATARACT EXTRACTION PHACO AND INTRAOCULAR               LENS PLACEMENT ([facility]) RIGHT;  Surgeon: Eulogio Bear,              MD;  Location: [place];  Service:               Ophthalmology;  Laterality: Right; 2013: COLONOSCOPY     Comment:  normal- cleared for 5 yrs- Dr Beckey Downing 02/17/2017: COLONOSCOPY WITH PROPOFOL; N/A     Comment:  Procedure: COLONOSCOPY WITH PROPOFOL;  Surgeon: Lin Landsman, MD;  Location: [place];                Service: Endoscopy;  Laterality: N/A; 10/12/2019: COLONOSCOPY WITH PROPOFOL; N/A     Comment:  Procedure: COLONOSCOPY WITH BIOPSY;  Surgeon: Lin Landsman, MD;  Location: [place];                Service: Endoscopy;   Laterality: N/A; 07/06/2014: EPIGASTRIC HERNIA REPAIR; N/A     Comment:  Procedure: HERNIA REPAIR EPIGASTRIC ADULT;  Surgeon:               Molly Maduro, MD;  Location: [facility];  Service:               General;  Laterality: N/A; No date: HERNIA REPAIR; Bilateral 07/06/2014: INSERTION OF MESH; N/A     Comment:  Procedure: INSERTION OF MESH;  Surgeon: Molly Maduro, MD;  Location: [facility];  Service: General;                Laterality: N/A; 11/07/2019: IR RADIOLOGIST EVAL & MGMT 04/25/2020: IR RADIOLOGIST EVAL & MGMT 12/05/2020: IR RADIOLOGIST EVAL & MGMT 06/30/2021: IR RADIOLOGIST EVAL & MGMT 02/17/2017: POLYPECTOMY     Comment:  Procedure: POLYPECTOMY;  Surgeon: Lin Landsman,               MD;  Location: [place];  Service: Endoscopy;; 10/12/2019: POLYPECTOMY; N/A     Comment:  Procedure: POLYPECTOMY;  Surgeon: Lin Landsman,  MD;  Location: [place];  Service: Endoscopy;               Laterality: N/A; 2008: SHOULDER ARTHROSCOPY; Left No date: SINUS EXPLORATION  BMI    Body Mass Index: 34.87 kg/m      Reproductive/Obstetrics negative OB ROS                             Anesthesia Physical Anesthesia Plan  ASA: 2  Anesthesia Plan: MAC   Post-op Pain Management: Minimal or no pain anticipated   Induction: Intravenous  PONV Risk Score and Plan: Treatment may vary due to age or medical condition  Airway Management Planned:   Additional Equipment:   Intra-op Plan:   Post-operative Plan:   Informed Consent:     Dental Advisory Given  Plan Discussed with: Anesthesiologist, CRNA and Surgeon  Anesthesia Plan Comments:         Anesthesia Quick Evaluation

## 2021-08-25 ENCOUNTER — Encounter: Payer: Self-pay | Admitting: Anesthesiology

## 2021-08-25 ENCOUNTER — Other Ambulatory Visit: Payer: Self-pay

## 2021-08-25 ENCOUNTER — Ambulatory Visit
Admission: RE | Admit: 2021-08-25 | Discharge: 2021-08-25 | Disposition: A | Discharge status: Home / Self Care | Payer: Medicare Other | Attending: Ophthalmology | Admitting: Ophthalmology

## 2021-08-25 ENCOUNTER — Encounter: Payer: Self-pay | Admitting: Oncology

## 2021-08-25 ENCOUNTER — Encounter: Payer: Self-pay | Admitting: Ophthalmology

## 2021-08-25 ENCOUNTER — Telehealth: Payer: Self-pay | Admitting: Family Medicine

## 2021-08-25 ENCOUNTER — Encounter
Admission: RE | Disposition: A | Discharge status: Home / Self Care | Payer: Self-pay | Source: Home / Self Care | Attending: Ophthalmology

## 2021-08-25 DIAGNOSIS — Z538 Procedure and treatment not carried out for other reasons: Secondary | ICD-10-CM | POA: Insufficient documentation

## 2021-08-25 DIAGNOSIS — Z01818 Encounter for other preprocedural examination: Secondary | ICD-10-CM | POA: Diagnosis not present

## 2021-08-25 DIAGNOSIS — I4891 Unspecified atrial fibrillation: Secondary | ICD-10-CM | POA: Diagnosis not present

## 2021-08-25 DIAGNOSIS — Z01810 Encounter for preprocedural cardiovascular examination: Secondary | ICD-10-CM | POA: Diagnosis not present

## 2021-08-25 HISTORY — PX: CATARACT EXTRACTION W/PHACO: SHX586

## 2021-08-25 HISTORY — DX: Myelodysplastic syndrome, unspecified: D46.9

## 2021-08-25 SURGERY — PHACOEMULSIFICATION, CATARACT, WITH IOL INSERTION
Anesthesia: Monitor Anesthesia Care | Laterality: Left

## 2021-08-25 MED ORDER — TETRACAINE HCL 0.5 % OP SOLN: 1.0000 [drp] | OPHTHALMIC | Status: DC | PRN

## 2021-08-25 MED ORDER — ARMC OPHTHALMIC DILATING DROPS: 1.0000 | OPHTHALMIC | Status: DC | PRN

## 2021-08-25 SURGICAL SUPPLY — 15 items
CANNULA ANT/CHMB 27GA (MISCELLANEOUS) IMPLANT
CATARACT SUITE SIGHTPATH (MISCELLANEOUS) ×2 IMPLANT
DISSECTOR HYDRO NUCLEUS 50X22 (MISCELLANEOUS) ×2 IMPLANT
GLOVE SURG GAMMEX PI TX LF 7.5 (GLOVE) ×2 IMPLANT
GLOVE SURG SYN 8.5  E (GLOVE) ×1
GLOVE SURG SYN 8.5 E (GLOVE) ×1 IMPLANT
NEEDLE FILTER BLUNT 18X 1/2SAF (NEEDLE) ×1
NEEDLE FILTER BLUNT 18X1 1/2 (NEEDLE) ×1 IMPLANT
PACK VIT ANT 23G (MISCELLANEOUS) IMPLANT
RING MALYGIN (MISCELLANEOUS) IMPLANT
SUT ETHILON 10-0 CS-B-6CS-B-6 (SUTURE)
SUTURE EHLN 10-0 CS-B-6CS-B-6 (SUTURE) IMPLANT
SYR 3ML LL SCALE MARK (SYRINGE) ×2 IMPLANT
SYR 5ML LL (SYRINGE) ×2 IMPLANT
WATER STERILE IRR 250ML POUR (IV SOLUTION) ×2 IMPLANT

## 2021-08-25 NOTE — Telephone Encounter (Signed)
Patient came into the office to ask Dr. Ronnald Ramp for a referral to the Cardiologist. Patient made an appt to Dr. Ronnald Ramp tomorrow on 08/26/21. Patient left a copy of his EKG report. I attempted to put the EkG into his chart, but it appears to be already in there. FYI

## 2021-08-25 NOTE — H&P (Signed)
Patient was found to be in asymptomatic afib.  After discussion with the anesthesiologist, the patient elected to defer cataract surgery and will be rescheduled.

## 2021-08-26 ENCOUNTER — Ambulatory Visit (INDEPENDENT_AMBULATORY_CARE_PROVIDER_SITE_OTHER): Payer: Medicare Other | Admitting: Family Medicine

## 2021-08-26 ENCOUNTER — Encounter: Payer: Self-pay | Admitting: Family Medicine

## 2021-08-26 VITALS — BP 120/70 | HR 64 | Ht 71.0 in | Wt 253.0 lb

## 2021-08-26 DIAGNOSIS — R5383 Other fatigue: Secondary | ICD-10-CM | POA: Diagnosis not present

## 2021-08-26 DIAGNOSIS — I4891 Unspecified atrial fibrillation: Secondary | ICD-10-CM

## 2021-08-26 NOTE — Progress Notes (Signed)
Date:  08/26/2021   Name:  Brett Boone   DOB:  06/26/1945   MRN:  [id number]   Chief Complaint: Atrial Fibrillation (New onset on EKG- repeat EKG for eye surgery. Send to cardiology for clearance)  Atrial Fibrillation Presents for initial visit. Symptoms are negative for bradycardia, dizziness, hypertension, hypotension and tachycardia. Past medical history includes atrial fibrillation.    Lab Results  Component Value Date   NA 133 (L) 04/02/2021   K 4.1 04/02/2021   CO2 24 04/02/2021   GLUCOSE 122 (H) 04/02/2021   BUN 24 (H) 04/02/2021   CREATININE 1.01 04/02/2021   CALCIUM 8.8 (L) 04/02/2021   GFRNONAA >60 04/02/2021   Lab Results  Component Value Date   CHOL 62 (L) 07/25/2021   HDL 47 07/25/2021   LDLCALC 3 07/25/2021   TRIG 34 07/25/2021   CHOLHDL 1.4 01/04/2018   Lab Results  Component Value Date   TSH 1.275 08/15/2019   Lab Results  Component Value Date   HGBA1C 5.8 (H) 08/30/2014   Lab Results  Component Value Date   WBC 7.2 07/09/2021   HGB 8.9 (L) 08/20/2021   HCT 27.1 (L) 08/20/2021   MCV 106.5 (H) 07/09/2021   PLT 321 07/09/2021   Lab Results  Component Value Date   ALT 13 04/02/2021   AST 20 04/02/2021   ALKPHOS 42 04/02/2021   BILITOT 1.1 04/02/2021   No results found for: "25OHVITD2", "25OHVITD3", "VD25OH"   Review of Systems  Constitutional:  Negative for chills and fever.  HENT:  Negative for ear pain and sore throat.   Respiratory:  Negative for cough.   Gastrointestinal:  Negative for abdominal pain, diarrhea and nausea.  Endocrine: Negative for polydipsia.  Genitourinary:  Negative for dysuria, frequency, hematuria and urgency.  Musculoskeletal:  Negative for myalgias and neck pain.  Skin:  Negative for rash.  Allergic/Immunologic: Negative for environmental allergies.  Neurological:  Negative for dizziness and headaches.  Hematological:  Does not bruise/bleed easily.  Psychiatric/Behavioral:  Negative for suicidal  ideas. The patient is not nervous/anxious.     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   Macrocytic anemia 08/15/2019   Weight loss 08/15/2019   Elevated ferritin 08/15/2019   Erectile dysfunction 07/25/2019   Class 2 severe obesity due to excess calories with serious comorbidity and body mass index (BMI) of 36.0 to 36.9 in adult [facility]) 07/19/2019   History of adenomatous polyp of colon    Annual physical exam 08/30/2014   Epigastric hernia 07/18/2014    Allergies  Allergen Reactions   Amoxicillin-Pot Clavulanate Hives and Nausea And Vomiting    Past Surgical History:  Procedure Laterality Date   CATARACT EXTRACTION W/PHACO Right 03/09/2017   Procedure: CATARACT EXTRACTION PHACO AND INTRAOCULAR LENS PLACEMENT ([place]) RIGHT;  Surgeon: Eulogio Bear, MD;  Location: [place];  Service: Ophthalmology;  Laterality: Right;   CATARACT EXTRACTION W/PHACO Left 08/25/2021   Procedure: CATARACT EXTRACTION PHACO AND INTRAOCULAR LENS PLACEMENT ([facility]) LEFT;  Surgeon: Eulogio Bear, MD;  Location: [place];  Service: Ophthalmology;  Laterality: Left;   COLONOSCOPY  2013   normal- cleared for 5 yrs- Dr Beckey Downing   COLONOSCOPY WITH PROPOFOL N/A 02/17/2017   Procedure: COLONOSCOPY WITH PROPOFOL;  Surgeon: Lin Landsman,  MD;  Location: [place];  Service: Endoscopy;  Laterality: N/A;   COLONOSCOPY WITH PROPOFOL N/A 10/12/2019   Procedure: COLONOSCOPY WITH BIOPSY;  Surgeon: Lin Landsman, MD;  Location: [place];  Service: Endoscopy;  Laterality: N/A;   EPIGASTRIC HERNIA REPAIR N/A 07/06/2014   Procedure: HERNIA REPAIR EPIGASTRIC ADULT;  Surgeon: Molly Maduro, MD;  Location: [facility];  Service: General;  Laterality: N/A;   HERNIA  REPAIR Bilateral    INSERTION OF MESH N/A 07/06/2014   Procedure: INSERTION OF MESH;  Surgeon: Molly Maduro, MD;  Location: [facility];  Service: General;  Laterality: N/A;   IR RADIOLOGIST EVAL & MGMT  11/07/2019   IR RADIOLOGIST EVAL & MGMT  04/25/2020   IR RADIOLOGIST EVAL & MGMT  12/05/2020   IR RADIOLOGIST EVAL & MGMT  06/30/2021   POLYPECTOMY  02/17/2017   Procedure: POLYPECTOMY;  Surgeon: Lin Landsman, MD;  Location: [place];  Service: Endoscopy;;   POLYPECTOMY N/A 10/12/2019   Procedure: POLYPECTOMY;  Surgeon: Lin Landsman, MD;  Location: [place];  Service: Endoscopy;  Laterality: N/A;   SHOULDER ARTHROSCOPY Left 2008   SINUS EXPLORATION      Social History   Tobacco Use   Smoking status: Former    Packs/day: 0.25    Years: 50.00    Total pack years: 12.50    Types: Cigarettes    Quit date: 05/03/2018    Years since quitting: 3.3   Smokeless tobacco: Former   Tobacco comments:    pt quit smoking  Vaping Use   Vaping Use: Never used  Substance Use Topics   Alcohol use: Yes    Comment: Occasional -1x/month   Drug use: No     Medication list has been reviewed and updated.  Current Meds  Medication Sig   acetaminophen (TYLENOL) 325 MG tablet Take 650 mg by mouth every 6 (six) hours as needed.   Calcium Carb-Cholecalciferol 600-400 MG-UNIT TABS Take by mouth.   Epoetin Alfa-epbx (RETACRIT IJ)    ibuprofen (ADVIL) 200 MG tablet Take 200 mg by mouth every 6 (six) hours as needed.   tadalafil (CIALIS) 5 MG tablet TAKE ONE TABLET BY MOUTH DAILY       08/26/2021    2:02 PM 07/25/2021    8:24 AM 07/23/2020    8:47 AM 07/26/2019   10:42 AM  GAD 7 : Generalized Anxiety Score  Nervous, Anxious, on Edge 0 0 0 0  Control/stop worrying 0 0 0 0  Worry too much - different things 0 0 0 0  Trouble relaxing 0 0 0 0  Restless 0 0 0 0  Easily annoyed or irritable 0 0 0 0  Afraid - awful might happen 0 0 0 0  Total GAD 7 Score 0 0 0 0  Anxiety  Difficulty Not difficult at all Not difficult at all         08/26/2021    2:01 PM 07/25/2021    8:24 AM 02/26/2021    3:19 PM  Depression screen PHQ 2/9  Decreased Interest 3 0 0  Down, Depressed, Hopeless 3 0 0  PHQ - 2 Score 6 0 0  Altered sleeping 3 2   Tired, decreased energy 3 0   Change in appetite 0 0   Feeling bad or failure about yourself  0 0   Trouble concentrating 0 0   Moving slowly or fidgety/restless 0 0   Suicidal thoughts 0 0   PHQ-9  Score 12 2   Difficult doing work/chores Somewhat difficult Not difficult at all     BP Readings from Last 3 Encounters:  08/26/21 120/70  08/25/21 109/74  08/20/21 115/76    Physical Exam Vitals and nursing note reviewed.  HENT:     Head: Normocephalic.     Right Ear: External ear normal.     Left Ear: External ear normal.     Nose: Nose normal.  Eyes:     General: No scleral icterus.       Right eye: No discharge.        Left eye: No discharge.     Conjunctiva/sclera: Conjunctivae normal.     Pupils: Pupils are equal, round, and reactive to light.  Neck:     Thyroid: No thyromegaly.     Vascular: No JVD.     Trachea: No tracheal deviation.  Cardiovascular:     Rate and Rhythm: Normal rate and regular rhythm.     Heart sounds: Normal heart sounds. No murmur heard.    No friction rub. No gallop.  Pulmonary:     Effort: No respiratory distress.     Breath sounds: Normal breath sounds. No wheezing, rhonchi or rales.  Abdominal:     General: Bowel sounds are normal.     Palpations: Abdomen is soft. There is no mass.     Tenderness: There is no abdominal tenderness. There is no guarding or rebound.  Musculoskeletal:        General: No tenderness. Normal range of motion.     Cervical back: Normal range of motion and neck supple.  Lymphadenopathy:     Cervical: No cervical adenopathy.  Skin:    General: Skin is warm.     Findings: No rash.  Neurological:     Mental Status: He is alert and oriented to person, place,  and time.     Cranial Nerves: No cranial nerve deficit.     Deep Tendon Reflexes: Reflexes are normal and symmetric.     Wt Readings from Last 3 Encounters:  08/26/21 253 lb (114.8 kg)  08/25/21 252 lb (114.3 kg)  08/05/21 250 lb (113.4 kg)    BP 120/70   Pulse 64   Ht 5' 11" (1.803 m)   Wt 253 lb (114.8 kg)   BMI 35.29 kg/m   Assessment and Plan: 1. New onset a-fib [facility]) New onset when evaluated by anesthesia was noted by computer to be in atrial fibrillation was not certain because I thought I saw some low attenuated P waves.  Patient comes in for reevaluation which I repeated EKG which noted the patient to be in the following interpretation: Sinus rhythm with first-degree block frequent PVCs with a PR 0.238 Q wave normal QRS normal QT normal no voltage criteria met for LVH.  No ischemic changes noted.  Patient was interpreted as borderline rhythm but atrial fibrillation was not noted.  Patient may be going intermittent in and out of atrial fibrillation and we will request evaluation by cardiology to determine if this needs to be further evaluated.  CHADS2 score was noted to be 0 at this time anyway and he would not be a significant risk for stroke.  Patient is having some symptomatology of fatigue as noted below and this may be consistent with him going up and heart rate which I noticed that when it was in the high 70s it was sinus rhythm if it was near higher 90s it was interpreted as possible atrial  fib.  Patient has been given EKGs that were done by anesthesiology and our EKG today showed his cardiologist the relative rhythm that he is going in and out of perhaps. - Ambulatory referral to Cardiology - EKG 12-Lead  2. Fatigue, unspecified type Since beginning injections patient has been feeling more more fatigued particular the day after and has episodes of dizziness that seem to be orthostatic in nature.  This may be more blood pressure related and this may need to be further  evaluated in the future.

## 2021-08-28 ENCOUNTER — Encounter: Payer: Self-pay | Admitting: Family Medicine

## 2021-08-29 DIAGNOSIS — R000 Tachycardia, unspecified: Secondary | ICD-10-CM | POA: Diagnosis not present

## 2021-08-29 DIAGNOSIS — R011 Cardiac murmur, unspecified: Secondary | ICD-10-CM | POA: Diagnosis not present

## 2021-08-29 DIAGNOSIS — I499 Cardiac arrhythmia, unspecified: Secondary | ICD-10-CM | POA: Diagnosis not present

## 2021-08-29 DIAGNOSIS — R002 Palpitations: Secondary | ICD-10-CM | POA: Diagnosis not present

## 2021-09-03 ENCOUNTER — Inpatient Hospital Stay: Payer: Medicare Other | Attending: Nurse Practitioner

## 2021-09-03 ENCOUNTER — Inpatient Hospital Stay: Payer: Medicare Other

## 2021-09-03 VITALS — BP 116/62

## 2021-09-03 DIAGNOSIS — D461 Refractory anemia with ring sideroblasts: Secondary | ICD-10-CM | POA: Insufficient documentation

## 2021-09-03 LAB — HEMOGLOBIN AND HEMATOCRIT, BLOOD
HCT: 27 % — ABNORMAL LOW (ref 39.0–52.0)
Hemoglobin: 8.9 g/dL — ABNORMAL LOW (ref 13.0–17.0)

## 2021-09-03 MED ORDER — DARBEPOETIN ALFA 200 MCG/0.4ML IJ SOSY
200.0000 ug | PREFILLED_SYRINGE | Freq: Once | INTRAMUSCULAR | Status: AC
  Administered 2021-09-03: 200 ug via SUBCUTANEOUS
  Filled 2021-09-03: qty 0.4

## 2021-09-11 DIAGNOSIS — I499 Cardiac arrhythmia, unspecified: Secondary | ICD-10-CM | POA: Diagnosis not present

## 2021-09-11 DIAGNOSIS — R011 Cardiac murmur, unspecified: Secondary | ICD-10-CM | POA: Diagnosis not present

## 2021-09-12 DIAGNOSIS — I499 Cardiac arrhythmia, unspecified: Secondary | ICD-10-CM | POA: Diagnosis not present

## 2021-09-12 DIAGNOSIS — R001 Bradycardia, unspecified: Secondary | ICD-10-CM | POA: Diagnosis not present

## 2021-09-17 ENCOUNTER — Inpatient Hospital Stay: Payer: Medicare Other

## 2021-09-17 VITALS — BP 126/71 | HR 68

## 2021-09-17 DIAGNOSIS — R002 Palpitations: Secondary | ICD-10-CM | POA: Diagnosis not present

## 2021-09-17 DIAGNOSIS — I499 Cardiac arrhythmia, unspecified: Secondary | ICD-10-CM | POA: Diagnosis not present

## 2021-09-17 DIAGNOSIS — R000 Tachycardia, unspecified: Secondary | ICD-10-CM | POA: Diagnosis not present

## 2021-09-17 DIAGNOSIS — D461 Refractory anemia with ring sideroblasts: Secondary | ICD-10-CM

## 2021-09-17 DIAGNOSIS — R011 Cardiac murmur, unspecified: Secondary | ICD-10-CM | POA: Diagnosis not present

## 2021-09-17 LAB — HEMOGLOBIN AND HEMATOCRIT, BLOOD
HCT: 26.7 % — ABNORMAL LOW (ref 39.0–52.0)
Hemoglobin: 8.8 g/dL — ABNORMAL LOW (ref 13.0–17.0)

## 2021-09-17 MED ORDER — EPOETIN ALFA-EPBX 20000 UNIT/ML IJ SOLN: 50000.0000 [IU] | INTRAMUSCULAR | Status: DC

## 2021-09-17 MED ORDER — EPOETIN ALFA-EPBX 10000 UNIT/ML IJ SOLN
10000.0000 [IU] | Freq: Once | INTRAMUSCULAR | Status: AC
  Administered 2021-09-17: 10000 [IU] via SUBCUTANEOUS
  Filled 2021-09-17: qty 1

## 2021-09-17 MED ORDER — EPOETIN ALFA-EPBX 40000 UNIT/ML IJ SOLN
40000.0000 [IU] | Freq: Once | INTRAMUSCULAR | Status: AC
  Administered 2021-09-17: 40000 [IU] via SUBCUTANEOUS
  Filled 2021-09-17: qty 1

## 2021-10-01 ENCOUNTER — Other Ambulatory Visit: Payer: Medicare Other

## 2021-10-01 ENCOUNTER — Ambulatory Visit: Payer: Medicare Other

## 2021-10-03 ENCOUNTER — Inpatient Hospital Stay: Payer: Medicare Other | Attending: Nurse Practitioner

## 2021-10-03 ENCOUNTER — Inpatient Hospital Stay: Payer: Medicare Other | Admitting: Oncology

## 2021-10-03 ENCOUNTER — Inpatient Hospital Stay: Payer: Medicare Other

## 2021-10-03 ENCOUNTER — Encounter: Payer: Self-pay | Admitting: Oncology

## 2021-10-03 VITALS — BP 125/59 | HR 58 | Temp 97.3°F | Resp 18 | Wt 259.0 lb

## 2021-10-03 DIAGNOSIS — D461 Refractory anemia with ring sideroblasts: Secondary | ICD-10-CM | POA: Insufficient documentation

## 2021-10-03 DIAGNOSIS — Z79899 Other long term (current) drug therapy: Secondary | ICD-10-CM | POA: Diagnosis not present

## 2021-10-03 DIAGNOSIS — D649 Anemia, unspecified: Secondary | ICD-10-CM

## 2021-10-03 DIAGNOSIS — R5383 Other fatigue: Secondary | ICD-10-CM | POA: Diagnosis not present

## 2021-10-03 LAB — CBC WITH DIFFERENTIAL/PLATELET
Abs Immature Granulocytes: 0.02 10*3/uL (ref 0.00–0.07)
Basophils Absolute: 0.1 10*3/uL (ref 0.0–0.1)
Basophils Relative: 1 %
Eosinophils Absolute: 0.1 10*3/uL (ref 0.0–0.5)
Eosinophils Relative: 2 %
HCT: 26.6 % — ABNORMAL LOW (ref 39.0–52.0)
Hemoglobin: 8.9 g/dL — ABNORMAL LOW (ref 13.0–17.0)
Immature Granulocytes: 0 %
Lymphocytes Relative: 23 %
Lymphs Abs: 1.3 10*3/uL (ref 0.7–4.0)
MCH: 35.5 pg — ABNORMAL HIGH (ref 26.0–34.0)
MCHC: 33.5 g/dL (ref 30.0–36.0)
MCV: 106 fL — ABNORMAL HIGH (ref 80.0–100.0)
Monocytes Absolute: 0.5 10*3/uL (ref 0.1–1.0)
Monocytes Relative: 9 %
Neutro Abs: 3.6 10*3/uL (ref 1.7–7.7)
Neutrophils Relative %: 65 %
Platelets: 311 10*3/uL (ref 150–400)
RBC: 2.51 MIL/uL — ABNORMAL LOW (ref 4.22–5.81)
RDW: 20.2 % — ABNORMAL HIGH (ref 11.5–15.5)
WBC: 5.6 10*3/uL (ref 4.0–10.5)
nRBC: 0.5 % — ABNORMAL HIGH (ref 0.0–0.2)

## 2021-10-03 LAB — IRON AND TIBC
Iron: 151 ug/dL (ref 45–182)
Saturation Ratios: 65 % — ABNORMAL HIGH (ref 17.9–39.5)
TIBC: 231 ug/dL — ABNORMAL LOW (ref 250–450)
UIBC: 80 ug/dL

## 2021-10-03 LAB — FERRITIN: Ferritin: 580 ng/mL — ABNORMAL HIGH (ref 24–336)

## 2021-10-03 MED ORDER — DARBEPOETIN ALFA 300 MCG/0.6ML IJ SOSY
300.0000 ug | PREFILLED_SYRINGE | INTRAMUSCULAR | Status: DC
  Administered 2021-10-03: 300 ug via SUBCUTANEOUS
  Filled 2021-10-03: qty 0.6

## 2021-10-03 NOTE — Progress Notes (Signed)
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 Bone marrow biopsy on 09/11/2019 revealed macrocytic anemia and hypercellular bone marrow for age with dyspoietic changes primarily involving the erythroid cell lines associated with abundant ring sideroblasts (> 15%).  There was no increase in blasts. Flow cytometry was negative. Cytogenetics revealed 45,X,-Y [20].  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 IPSS score is low.   Baseline EPO level was 63.3 in August 2021.  Patient received Aranesp for a month in September 2021 and was then switched to [place].  He is presently on weekly Retacrit 20,000 units.   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   MRI abdomen in September 2021 showed 1.3 cm lesion in the left kidney concerning for renal cell carcinoma for which he follows up with urology    Interval history-patient has been experiencing more fatigue than usual.  He has been coming for EPO injections now every 2 weeks.  Appetite and weight have remained stable.  ECOG PS- 1 Pain scale- 0 Opioid associated constipation- no  Review of systems- Review of Systems  Constitutional:  Positive for malaise/fatigue. Negative for chills, fever and weight loss.  HENT:  Negative for congestion,  ear discharge and nosebleeds.   Eyes:  Negative for blurred vision.  Respiratory:  Negative for cough, hemoptysis, sputum production, shortness of breath and wheezing.   Cardiovascular:  Negative for chest pain, palpitations, orthopnea and claudication.  Gastrointestinal:  Negative for abdominal pain, blood in stool, constipation, diarrhea, heartburn, melena, nausea and vomiting.  Genitourinary:  Negative for dysuria, flank pain, frequency, hematuria and urgency.  Musculoskeletal:  Negative for back pain, joint pain and myalgias.  Skin:  Negative for rash.  Neurological:  Negative for dizziness, tingling, focal weakness, seizures, weakness and headaches.  Endo/Heme/Allergies:  Does not bruise/bleed easily.  Psychiatric/Behavioral:  Negative for depression and suicidal ideas. The patient does not have insomnia.       Allergies  Allergen Reactions   Amoxicillin-Pot Clavulanate Hives and Nausea And Vomiting     Past Medical History:  Diagnosis Date   Anemia    Heart murmur    Hydrocele    MDS (myelodysplastic syndrome) (HCC)    Rotator cuff tear arthropathy of right shoulder 08/01/2020   Venous stasis dermatitis of both lower extremities      Past Surgical History:  Procedure Laterality Date   CATARACT EXTRACTION W/PHACO Right 03/09/2017   Procedure: CATARACT EXTRACTION PHACO AND INTRAOCULAR LENS PLACEMENT ([place]) RIGHT;  Surgeon: Eulogio Bear, MD;  Location: [place];  Service: Ophthalmology;  Laterality: Right;   CATARACT EXTRACTION W/PHACO Left 08/25/2021   Procedure: CATARACT EXTRACTION PHACO AND INTRAOCULAR LENS PLACEMENT ([facility]) LEFT;  Surgeon: Eulogio Bear, MD;  Location: [place];  Service: Ophthalmology;  Laterality: Left;   COLONOSCOPY  2013   normal- cleared for 5  yrs- Dr Beckey Downing   COLONOSCOPY WITH PROPOFOL N/A 02/17/2017   Procedure: COLONOSCOPY WITH PROPOFOL;  Surgeon: Lin Landsman, MD;  Location: [place];  Service:  Endoscopy;  Laterality: N/A;   COLONOSCOPY WITH PROPOFOL N/A 10/12/2019   Procedure: COLONOSCOPY WITH BIOPSY;  Surgeon: Lin Landsman, MD;  Location: [place];  Service: Endoscopy;  Laterality: N/A;   EPIGASTRIC HERNIA REPAIR N/A 07/06/2014   Procedure: HERNIA REPAIR EPIGASTRIC ADULT;  Surgeon: Molly Maduro, MD;  Location: [facility];  Service: General;  Laterality: N/A;   HERNIA REPAIR Bilateral    INSERTION OF MESH N/A 07/06/2014   Procedure: INSERTION OF MESH;  Surgeon: Molly Maduro, MD;  Location: [facility];  Service: General;  Laterality: N/A;   IR RADIOLOGIST EVAL & MGMT  11/07/2019   IR RADIOLOGIST EVAL & MGMT  04/25/2020   IR RADIOLOGIST EVAL & MGMT  12/05/2020   IR RADIOLOGIST EVAL & MGMT  06/30/2021   POLYPECTOMY  02/17/2017   Procedure: POLYPECTOMY;  Surgeon: Lin Landsman, MD;  Location: [place];  Service: Endoscopy;;   POLYPECTOMY N/A 10/12/2019   Procedure: POLYPECTOMY;  Surgeon: Lin Landsman, MD;  Location: [place];  Service: Endoscopy;  Laterality: N/A;   SHOULDER ARTHROSCOPY Left 2008   SINUS EXPLORATION      Social History   Socioeconomic History   Marital status: Married    Spouse name: Not on file   Number of children: 3   Years of education: Not on file   Highest education level: Bachelor's degree (e.g., BA, AB, BS)  Occupational History   Occupation: Retired  Tobacco Use   Smoking status: Former    Packs/day: 0.25    Years: 50.00    Total pack years: 12.50    Types: Cigarettes    Quit date: 05/03/2018    Years since quitting: 3.4   Smokeless tobacco: Former   Tobacco comments:    pt quit smoking  Vaping Use   Vaping Use: Never used  Substance and Sexual Activity   Alcohol use: Yes    Comment: Occasional -1x/month   Drug use: No   Sexual activity: Yes  Other Topics Concern   Not on file  Social History Narrative   Not on file   Social Determinants of Health   Financial Resource Strain: Low  Risk  (02/26/2021)   Overall Financial Resource Strain (CARDIA)    Difficulty of Paying Living Expenses: Not hard at all  Food Insecurity: No Food Insecurity (02/26/2021)   Hunger Vital Sign    Worried About Running Out of Food in the Last Year: Never true    [place] in the Last Year: Never true  Transportation Needs: No Transportation Needs (02/26/2021)   PRAPARE - Hydrologist (Medical): No    Lack of Transportation (Non-Medical): No  Physical Activity: Insufficiently Active (02/26/2021)   Exercise Vital Sign    Days of Exercise per Week: 3 days    Minutes of Exercise per Session: 30 min  Stress: No Stress Concern Present (02/26/2021)   [place]    Feeling of Stress : Only a little  Social Connections: Moderately Integrated (02/26/2021)   Social Connection and Isolation Panel [NHANES]    Frequency of Communication with Friends and Family: More than three times a week    Frequency of Social Gatherings with Friends and Family: Once a week    Attends Religious  Services: More than 4 times per year    Active Member of Clubs or Organizations: No    Attends Archivist Meetings: Never    Marital Status: Married  Human resources officer Violence: Not At Risk (02/26/2021)   Humiliation, Afraid, Rape, and Kick questionnaire    Fear of Current or Ex-Partner: No    Emotionally Abused: No    Physically Abused: No    Sexually Abused: No    Family History  Problem Relation Age of Onset   Diabetes Father    Heart disease Father 47   Stroke Mother      Current Outpatient Medications:    acetaminophen (TYLENOL) 325 MG tablet, Take 650 mg by mouth every 6 (six) hours as needed., Disp: , Rfl:    Calcium Carb-Cholecalciferol 600-400 MG-UNIT TABS, Take by mouth., Disp: , Rfl:    Epoetin Alfa-epbx (RETACRIT IJ), , Disp: , Rfl:    ibuprofen (ADVIL) 200 MG tablet, Take 200 mg by mouth every 6 (six)  hours as needed., Disp: , Rfl:    tadalafil (CIALIS) 5 MG tablet, TAKE ONE TABLET BY MOUTH DAILY, Disp: 30 tablet, Rfl: 11   tadalafil (CIALIS) 5 MG tablet, Take 1 tablet by mouth daily., Disp: , Rfl:  No current facility-administered medications for this visit.  Facility-Administered Medications Ordered in Other Visits:    Darbepoetin Alfa (ARANESP) injection 300 mcg, 300 mcg, Subcutaneous, Weekly, Sindy Guadeloupe, MD, 300 mcg at 10/03/21 1424   epoetin alfa-epbx (RETACRIT) injection 40,000 Units, 40,000 Units, Subcutaneous, Weekly, Sindy Guadeloupe, MD, 40,000 Units at 04/02/21 1523  Physical exam:  Vitals:   10/03/21 1321  BP: (!) 125/59  Pulse: (!) 58  Resp: 18  Temp: (!) 97.3 F (36.3 C)  TempSrc: Tympanic  SpO2: 99%  Weight: 259 lb (117.5 kg)   Physical Exam Constitutional:      General: He is not in acute distress. Cardiovascular:     Rate and Rhythm: Normal rate and regular rhythm.     Heart sounds: Normal heart sounds.  Pulmonary:     Effort: Pulmonary effort is normal.     Breath sounds: Normal breath sounds.  Abdominal:     General: Bowel sounds are normal.     Palpations: Abdomen is soft.     Comments: No palpable hepatosplenomegaly  Skin:    General: Skin is warm and dry.  Neurological:     Mental Status: He is alert and oriented to person, place, and time.         Latest Ref Rng & Units 04/02/2021    2:03 PM  CMP  Glucose 70 - 99 mg/dL 122   BUN 8 - 23 mg/dL 24   Creatinine 0.61 - 1.24 mg/dL 1.01   Sodium 135 - 145 mmol/L 133   Potassium 3.5 - 5.1 mmol/L 4.1   Chloride 98 - 111 mmol/L 105   CO2 22 - 32 mmol/L 24   Calcium 8.9 - 10.3 mg/dL 8.8   Total Protein 6.5 - 8.1 g/dL 6.6   Total Bilirubin 0.3 - 1.2 mg/dL 1.1   Alkaline Phos 38 - 126 U/L 42   AST 15 - 41 U/L 20   ALT 0 - 44 U/L 13       Latest Ref Rng & Units 10/03/2021    1:01 PM  CBC  WBC 4.0 - 10.5 K/uL 5.6   Hemoglobin 13.0 - 17.0 g/dL 8.9   Hematocrit 39.0 - 52.0 % 26.6   Platelets  150 -  400 K/uL 311         Assessment and plan- Patient is a 76 y.o. male with low risk MDS with SF 3 B1 mutation here for routine follow-up  Patient has been mainly Getting Retacrit every other week at 50,000 units.  However there has been a shortage of Retacrit and he did receive 200 mcg of iron as on 09/03/2021.  Today again we are running out of Retacrit and therefore patient will receive 300 mcg of Aranesp which we will continue on a weekly basis.Patient has been on EPO management since November 2021 and his hemoglobin was staying stable between 9-10 in the past.  However over the last 2 months his hemoglobin is more consistently in the 8-8.5 range.  He is also feeling more fatigued.  I am therefore concerned that his anemia is currently not well controlled with EPO and we are at a point where we need to consider second line of treatment.  I will see how his hemoglobin does over the next 3 weeks with weekly Aranesp at 300 mcg and if it remains less than 9 I plan to switch him to Luspatercept given that he has SF 3 B1 mutation.  Luspatercept will be started at a dose of 1 mg Per kilogram and titrated upwards based on response.  Gust risks and benefits of Luspatercept including all but not limited to nausea, vomiting, diarrhea, abnormal LFTs and hypertension.  Treatment will be given with a palliative intent.  If patient does not respond to Luspatercept I will repeat his bone marrow biopsy and refer him to [facility] for second opinion before considering third line hypomethylating agents.  A phase 3 trial (MEDALIST) included 229 patients with ?15 percent ring sideroblasts (or ?5 percent ring sideroblasts if an SF3B1 mutation was present) and with <5 percent bone marrow blasts, who were refractory or unlikely to respond to ESAs . Luspatercept was superior to placebo for achieving RBC transfusion-independence (TI) for ?8 weeks (38 versus 13 percent) and TI for ?12 weeks (28 versus 8 percent). Luspatercept was  well-tolerated, and toxicity was primarily mild fatigue, diarrhea, asthenia, nausea, and dizziness. Longer-term follow-up (median >26 months) indicates that benefits of luspatercept continued to increase over time [60]. Compared with placebo, luspatercept achieved more ?8-week TI (45 versus 16 percent), ?16-week TI (28 versus 7 percent; odds ratio 6.0 [95% CI 2.2-16.0]), ?50 percent reduction in transfusion requirements (50 versus 14 percent), and erythroid improvement (59 versus 17 percent, per IWG 2006 criteria [61]); the median duration of ?8-week TI was 30 weeks with luspatercept and 17 weeks for placebo.    Median overall survival with low risk MDS is about 5.3 years and median time to 25% AML evolution is about 10.8 years   Visit Diagnosis 1. Refractory anemia with ring sideroblasts (HCC)   2. Erythropoietin (EPO) stimulating agent anemia management patient      Dr. Randa Evens, MD, MPH [facility] [phone] 10/03/2021 4:00 PM

## 2021-10-03 NOTE — Progress Notes (Signed)
Patient here today for follow up regarding anemia. Patient is concerned about his lab results, reports overall does not feel worse.

## 2021-10-03 NOTE — Progress Notes (Signed)
DISCONTINUE ON PATHWAY REGIMEN - MDS     A cycle is every two weeks:     Darbepoetin alfa   **Always confirm dose/schedule in your pharmacy ordering system**  REASON: Disease Progression PRIOR TREATMENT: [id number]: Darbepoetin 200 mcg q2 Weeks TREATMENT RESPONSE: Progressive Disease (PD)  START ON PATHWAY REGIMEN - MDS     A cycle is every 21 days:     Luspatercept-aamt   **Always confirm dose/schedule in your pharmacy ordering system**  Patient Characteristics: Lower-Risk, MDS-Ring Sideroblasts/SF3B1 Mutation, Second Line, Anemia Requiring Treatment, No Prior Luspatercept Did cytogenetic and molecular analysis 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

## 2021-10-04 ENCOUNTER — Other Ambulatory Visit: Payer: Self-pay

## 2021-10-07 ENCOUNTER — Encounter: Payer: Self-pay | Admitting: Ophthalmology

## 2021-10-08 NOTE — Discharge Instructions (Signed)

## 2021-10-09 ENCOUNTER — Inpatient Hospital Stay: Payer: Medicare Other

## 2021-10-09 VITALS — BP 139/72 | HR 82

## 2021-10-09 DIAGNOSIS — R5383 Other fatigue: Secondary | ICD-10-CM | POA: Diagnosis not present

## 2021-10-09 DIAGNOSIS — D461 Refractory anemia with ring sideroblasts: Secondary | ICD-10-CM | POA: Diagnosis not present

## 2021-10-09 LAB — HEMOGLOBIN AND HEMATOCRIT, BLOOD
HCT: 27.4 % — ABNORMAL LOW (ref 39.0–52.0)
Hemoglobin: 8.8 g/dL — ABNORMAL LOW (ref 13.0–17.0)

## 2021-10-09 MED ORDER — DARBEPOETIN ALFA 300 MCG/0.6ML IJ SOSY
300.0000 ug | PREFILLED_SYRINGE | INTRAMUSCULAR | Status: DC
  Administered 2021-10-09: 300 ug via SUBCUTANEOUS
  Filled 2021-10-09: qty 0.6

## 2021-10-13 ENCOUNTER — Other Ambulatory Visit: Payer: Self-pay

## 2021-10-13 ENCOUNTER — Ambulatory Visit: Payer: Medicare Other | Admitting: General Practice

## 2021-10-13 ENCOUNTER — Encounter: Payer: Self-pay | Admitting: Ophthalmology

## 2021-10-13 ENCOUNTER — Encounter
Admission: RE | Disposition: A | Discharge status: Home / Self Care | Payer: Self-pay | Source: Home / Self Care | Attending: Ophthalmology

## 2021-10-13 ENCOUNTER — Ambulatory Visit (AMBULATORY_SURGERY_CENTER): Payer: Medicare Other | Admitting: General Practice

## 2021-10-13 ENCOUNTER — Ambulatory Visit
Admission: RE | Admit: 2021-10-13 | Discharge: 2021-10-13 | Disposition: A | Discharge status: Home / Self Care | Payer: Medicare Other | Attending: Ophthalmology | Admitting: Ophthalmology

## 2021-10-13 DIAGNOSIS — H2512 Age-related nuclear cataract, left eye: Secondary | ICD-10-CM

## 2021-10-13 DIAGNOSIS — Z87891 Personal history of nicotine dependence: Secondary | ICD-10-CM | POA: Insufficient documentation

## 2021-10-13 DIAGNOSIS — D469 Myelodysplastic syndrome, unspecified: Secondary | ICD-10-CM

## 2021-10-13 DIAGNOSIS — D649 Anemia, unspecified: Secondary | ICD-10-CM | POA: Diagnosis not present

## 2021-10-13 HISTORY — PX: CATARACT EXTRACTION W/PHACO: SHX586

## 2021-10-13 SURGERY — PHACOEMULSIFICATION, CATARACT, WITH IOL INSERTION
Anesthesia: Monitor Anesthesia Care | Site: Eye | Laterality: Left

## 2021-10-13 MED ORDER — ACETAMINOPHEN 160 MG/5ML PO SOLN: 975.0000 mg | Freq: Once | ORAL | Status: DC | PRN

## 2021-10-13 MED ORDER — SIGHTPATH DOSE#1 BSS IO SOLN
INTRAOCULAR | Status: DC | PRN
  Administered 2021-10-13: 15 mL

## 2021-10-13 MED ORDER — TETRACAINE HCL 0.5 % OP SOLN
1.0000 [drp] | OPHTHALMIC | Status: DC | PRN
  Administered 2021-10-13 (×3): 1 [drp] via OPHTHALMIC

## 2021-10-13 MED ORDER — LIDOCAINE HCL (PF) 2 % IJ SOLN
INTRAOCULAR | Status: DC | PRN
  Administered 2021-10-13: 1 mL via INTRAOCULAR

## 2021-10-13 MED ORDER — MIDAZOLAM HCL 2 MG/2ML IJ SOLN
INTRAMUSCULAR | Status: DC | PRN
  Administered 2021-10-13: 2 mg via INTRAVENOUS

## 2021-10-13 MED ORDER — ACETAMINOPHEN 500 MG PO TABS: 1000.0000 mg | ORAL_TABLET | Freq: Once | ORAL | Status: DC | PRN

## 2021-10-13 MED ORDER — ONDANSETRON HCL 4 MG/2ML IJ SOLN: 4.0000 mg | Freq: Once | INTRAMUSCULAR | Status: DC | PRN

## 2021-10-13 MED ORDER — SIGHTPATH DOSE#1 NA HYALUR & NA CHOND-NA HYALUR IO KIT
PACK | INTRAOCULAR | Status: DC | PRN
  Administered 2021-10-13: 1 via OPHTHALMIC

## 2021-10-13 MED ORDER — FENTANYL CITRATE (PF) 100 MCG/2ML IJ SOLN
INTRAMUSCULAR | Status: DC | PRN
  Administered 2021-10-13: 100 ug via INTRAVENOUS

## 2021-10-13 MED ORDER — SIGHTPATH DOSE#1 BSS IO SOLN
INTRAOCULAR | Status: DC | PRN
  Administered 2021-10-13: 104 mL via OPHTHALMIC

## 2021-10-13 MED ORDER — ARMC OPHTHALMIC DILATING DROPS
1.0000 | OPHTHALMIC | Status: DC | PRN
  Administered 2021-10-13 (×3): 1 via OPHTHALMIC

## 2021-10-13 MED ORDER — MOXIFLOXACIN HCL 0.5 % OP SOLN
OPHTHALMIC | Status: DC | PRN
  Administered 2021-10-13: 0.2 mL via OPHTHALMIC

## 2021-10-13 MED ORDER — LACTATED RINGERS IV SOLN: INTRAVENOUS | Status: DC

## 2021-10-13 SURGICAL SUPPLY — 19 items
CANNULA ANT/CHMB 27G (MISCELLANEOUS) IMPLANT
CANNULA ANT/CHMB 27GA (MISCELLANEOUS) IMPLANT
CATARACT SUITE SIGHTPATH (MISCELLANEOUS) ×1 IMPLANT
DISSECTOR HYDRO NUCLEUS 50X22 (MISCELLANEOUS) ×1 IMPLANT
FEE CATARACT SUITE SIGHTPATH (MISCELLANEOUS) ×1 IMPLANT
GLOVE SURG GAMMEX PI TX LF 7.5 (GLOVE) ×1 IMPLANT
GLOVE SURG SYN 8.5  E (GLOVE) ×1
GLOVE SURG SYN 8.5 E (GLOVE) ×1 IMPLANT
GLOVE SURG SYN 8.5 PF PI (GLOVE) ×1 IMPLANT
LENS IOL TECNIS EYHANCE 21.5 (Intraocular Lens) IMPLANT
NDL FILTER BLUNT 18X1 1/2 (NEEDLE) ×1 IMPLANT
NEEDLE FILTER BLUNT 18X1 1/2 (NEEDLE) ×1 IMPLANT
PACK VIT ANT 23G (MISCELLANEOUS) IMPLANT
RING MALYGIN (MISCELLANEOUS) IMPLANT
SUT ETHILON 10-0 CS-B-6CS-B-6 (SUTURE)
SUTURE EHLN 10-0 CS-B-6CS-B-6 (SUTURE) IMPLANT
SYR 3ML LL SCALE MARK (SYRINGE) ×1 IMPLANT
SYR 5ML LL (SYRINGE) ×1 IMPLANT
WATER STERILE IRR 250ML POUR (IV SOLUTION) ×1 IMPLANT

## 2021-10-13 NOTE — Anesthesia Postprocedure Evaluation (Signed)
Anesthesia Post Note  Patient: Brett Boone  Procedure(s) Performed: CATARACT EXTRACTION PHACO AND INTRAOCULAR LENS PLACEMENT ([facility]) LEFT (Left: Eye)     Patient location during evaluation: PACU Anesthesia Type: MAC Level of consciousness: awake and alert Pain management: pain level controlled Vital Signs Assessment: post-procedure vital signs reviewed and stable Respiratory status: spontaneous breathing, nonlabored ventilation, respiratory function stable and patient connected to nasal cannula oxygen Cardiovascular status: stable and blood pressure returned to baseline Postop Assessment: no apparent nausea or vomiting Anesthetic complications: no   No notable events documented.  April Manson

## 2021-10-13 NOTE — Op Note (Signed)
OPERATIVE NOTE  Brett Boone [id number] 10/13/2021   PREOPERATIVE DIAGNOSIS:  Nuclear sclerotic cataract left eye.  H25.12   POSTOPERATIVE DIAGNOSIS:    Nuclear sclerotic cataract left eye.     PROCEDURE:  Phacoemusification with posterior chamber intraocular lens placement of the left eye   LENS:   Implant Name Type Inv. Item Serial No. Manufacturer Lot No. LRB No. Used Action  LENS IOL TECNIS EYHANCE 21.5 - [id number] Intraocular Lens LENS IOL TECNIS EYHANCE 21.5 [id number] [employer]  Left 1 Implanted      Procedure(s) with comments: CATARACT EXTRACTION PHACO AND INTRAOCULAR LENS PLACEMENT (IOC) LEFT (Left) - 8.12 0:52.0  DIB00 +21.5   ULTRASOUND TIME: 0 minutes 52 seconds.  CDE 8.12   SURGEON:  Benay Pillow, MD, MPH   ANESTHESIA:  Topical with tetracaine drops augmented with 1% preservative-free intracameral lidocaine.  ESTIMATED BLOOD LOSS: <1 mL   COMPLICATIONS:  None.   DESCRIPTION OF PROCEDURE:  The patient was identified in the holding room and transported to the operating room and placed in the supine position under the operating microscope.  The left eye was identified as the operative eye and it was prepped and draped in the usual sterile ophthalmic fashion.   A 1.0 millimeter clear-corneal paracentesis was made at the 5:00 position. 0.5 ml of preservative-free 1% lidocaine with epinephrine was injected into the anterior chamber.  The anterior chamber was filled with viscoelastic.  A 2.4 millimeter keratome was used to make a near-clear corneal incision at the 2:00 position.  A curvilinear capsulorrhexis was made with a cystotome and capsulorrhexis forceps.  Balanced salt solution was used to hydrodissect and hydrodelineate the nucleus.   Phacoemulsification was then used in stop and chop fashion to remove the lens nucleus and epinucleus.  The remaining cortex was then removed using the irrigation and aspiration handpiece. Viscoelastic was then placed into the  capsular bag to distend it for lens placement.  A lens was then injected into the capsular bag.  The remaining viscoelastic was aspirated.   Wounds were hydrated with balanced salt solution.  The anterior chamber was inflated to a physiologic pressure with balanced salt solution.  Intracameral vigamox 0.1 mL undiltued was injected into the eye and a drop placed onto the ocular surface.  No wound leaks were noted.  The patient was taken to the recovery room in stable condition without complications of anesthesia or surgery  Benay Pillow 10/13/2021, 10:30 AM

## 2021-10-13 NOTE — Anesthesia Preprocedure Evaluation (Addendum)
Anesthesia Evaluation  Patient identified by MRN, date of birth, ID band Patient awake    Reviewed: Allergy & Precautions, NPO status , Patient's Chart, lab work & pertinent test results  Airway Mallampati: II  TM Distance: >3 FB Neck ROM: full    Dental no notable dental hx.    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;  Service:               Ophthalmology;  Laterality: Right; 2013: COLONOSCOPY     Comment:  normal- cleared for 5 yrs- Dr Beckey Downing 02/17/2017: COLONOSCOPY WITH PROPOFOL; N/A     Comment:  Procedure: COLONOSCOPY WITH PROPOFOL;  Surgeon: Lin Landsman, MD;  Location: [place];                Service: Endoscopy;  Laterality: N/A; 10/12/2019: COLONOSCOPY WITH PROPOFOL; N/A     Comment:  Procedure: COLONOSCOPY WITH BIOPSY;  Surgeon: Lin Landsman, MD;   Location: [place];                Service: Endoscopy;  Laterality: N/A; 07/06/2014: EPIGASTRIC HERNIA REPAIR; N/A     Comment:  Procedure: HERNIA REPAIR EPIGASTRIC ADULT;  Surgeon:               Molly Maduro, MD;  Location: [facility];  Service:               General;  Laterality: N/A; No date: HERNIA REPAIR; Bilateral 07/06/2014: INSERTION OF MESH; N/A     Comment:  Procedure: INSERTION OF MESH;  Surgeon: Molly Maduro, MD;  Location: [facility];  Service: General;                Laterality: N/A; 11/07/2019: IR RADIOLOGIST EVAL & MGMT 04/25/2020: IR RADIOLOGIST EVAL & MGMT 12/05/2020: IR RADIOLOGIST EVAL & MGMT 06/30/2021: IR RADIOLOGIST EVAL & MGMT 02/17/2017: POLYPECTOMY     Comment:  Procedure: POLYPECTOMY;  Surgeon: Lin Landsman,               MD;  Location: [place];  Service: Endoscopy;; 10/12/2019: POLYPECTOMY; N/A  Comment:  Procedure: POLYPECTOMY;  Surgeon: Lin Landsman,               MD;  Location: [place];  Service: Endoscopy;               Laterality: N/A; 2008: SHOULDER ARTHROSCOPY; Left No date: SINUS EXPLORATION  BMI    Body Mass Index: 34.87 kg/m      Reproductive/Obstetrics negative OB ROS                            Anesthesia Physical  Anesthesia Plan  ASA: 2  Anesthesia Plan: MAC   Post-op Pain Management: Minimal or no pain anticipated   Induction: Intravenous  PONV Risk Score and Plan: 1 and Treatment may vary due to age or medical condition, TIVA and Midazolam  Airway Management Planned:   Additional Equipment:   Intra-op Plan:   Post-operative Plan:   Informed Consent:     Dental Advisory Given  Plan Discussed with: CRNA and Surgeon  Anesthesia Plan Comments: (New-onset Afib. Pt asymptomatic with stable blood pressure. Will cancel and refer to PCP for cardiac eval.)       Anesthesia Quick Evaluation

## 2021-10-13 NOTE — H&P (Signed)
[facility]   Primary Care Physician:  [name], MD Ophthalmologist: Dr. Benay Pillow  Pre-Procedure History & Physical: HPI:  Brett Boone is a 76 y.o. male here for cataract surgery.   Past Medical History:  Diagnosis Date   Anemia    Heart murmur    Hydrocele    MDS (myelodysplastic syndrome) (HCC)    Rotator cuff tear arthropathy of right shoulder 08/01/2020   Venous stasis dermatitis of both lower extremities     Past Surgical History:  Procedure Laterality Date   CATARACT EXTRACTION W/PHACO Right 03/09/2017   Procedure: CATARACT EXTRACTION PHACO AND INTRAOCULAR LENS PLACEMENT ([place]) RIGHT;  Surgeon: Eulogio Bear, MD;  Location: [place];  Service: Ophthalmology;  Laterality: Right;   CATARACT EXTRACTION W/PHACO Left 08/25/2021   Procedure: CATARACT EXTRACTION PHACO AND INTRAOCULAR LENS PLACEMENT (IOC) LEFT;  Surgeon: Eulogio Bear, MD;  Location: [place];  Service: Ophthalmology;  Laterality: Left;   COLONOSCOPY  2013   normal- cleared for 5 yrs- Dr Beckey Downing   COLONOSCOPY WITH PROPOFOL N/A 02/17/2017   Procedure: COLONOSCOPY WITH PROPOFOL;  Surgeon: Lin Landsman, MD;  Location: [place];  Service: Endoscopy;  Laterality: N/A;   COLONOSCOPY WITH PROPOFOL N/A 10/12/2019   Procedure: COLONOSCOPY WITH BIOPSY;  Surgeon: Lin Landsman, MD;  Location: [place];  Service: Endoscopy;  Laterality: N/A;   EPIGASTRIC HERNIA REPAIR N/A 07/06/2014   Procedure: HERNIA REPAIR EPIGASTRIC ADULT;  Surgeon: Molly Maduro, MD;  Location: [facility];  Service: General;  Laterality: N/A;   HERNIA REPAIR Bilateral    INSERTION OF MESH N/A 07/06/2014   Procedure: INSERTION OF MESH;  Surgeon: Molly Maduro, MD;  Location: [facility];  Service: General;  Laterality: N/A;   IR RADIOLOGIST EVAL & MGMT  11/07/2019   IR RADIOLOGIST EVAL & MGMT  04/25/2020   IR RADIOLOGIST EVAL & MGMT  12/05/2020   IR RADIOLOGIST EVAL  & MGMT  06/30/2021   POLYPECTOMY  02/17/2017   Procedure: POLYPECTOMY;  Surgeon: Lin Landsman, MD;  Location: [place];  Service: Endoscopy;;   POLYPECTOMY N/A 10/12/2019   Procedure: POLYPECTOMY;  Surgeon: Lin Landsman, MD;  Location: [place];  Service: Endoscopy;  Laterality: N/A;   SHOULDER ARTHROSCOPY Left 2008   SINUS EXPLORATION      Prior to Admission medications   Medication Sig Start Date End Date Taking? Authorizing Provider  acetaminophen (TYLENOL) 325 MG tablet Take 650 mg by mouth every 6 (six) hours as needed.   Yes [provider]  Calcium Carb-Cholecalciferol 600-400 MG-UNIT TABS Take by mouth.   Yes [provider]  Epoetin Alfa-epbx (RETACRIT IJ)    Yes [provider]  ibuprofen (ADVIL) 200 MG tablet Take 200 mg by mouth every 6 (six) hours as needed.    [provider]  tadalafil (CIALIS) 5 MG tablet TAKE ONE TABLET BY MOUTH DAILY 07/09/21   Billey Co, MD  tadalafil (CIALIS) 5 MG tablet Take 1 tablet by mouth daily. 07/09/21   [provider]    Allergies as of 09/24/2021 - Review Complete 08/26/2021  Allergen Reaction Noted   Amoxicillin-pot clavulanate Hives and Nausea And Vomiting 06/21/2014    Family History  Problem Relation Age of Onset   Diabetes Father    Heart disease Father 77   Stroke Mother     Social History   Socioeconomic History   Marital status: Married    Spouse  name: Not on file   Number of children: 3   Years of education: Not on file   Highest education level: Bachelor's degree (e.g., BA, AB, BS)  Occupational History   Occupation: Retired  Tobacco Use   Smoking status: Former    Packs/day: 0.25    Years: 50.00    Total pack years: 12.50    Types: Cigarettes    Quit date: 05/03/2018    Years since quitting: 3.4   Smokeless tobacco: Former   Tobacco comments:    pt quit smoking  Vaping Use   Vaping Use: Never used  Substance and Sexual Activity    Alcohol use: Yes    Comment: Occasional -1x/month   Drug use: No   Sexual activity: Yes  Other Topics Concern   Not on file  Social History Narrative   Not on file   Social Determinants of Health   Financial Resource Strain: Low Risk  (02/26/2021)   Overall Financial Resource Strain (CARDIA)    Difficulty of Paying Living Expenses: Not hard at all  Food Insecurity: No Food Insecurity (02/26/2021)   Hunger Vital Sign    Worried About Running Out of Food in the Last Year: Never true    [place] in the Last Year: Never true  Transportation Needs: No Transportation Needs (02/26/2021)   PRAPARE - Hydrologist (Medical): No    Lack of Transportation (Non-Medical): No  Physical Activity: Insufficiently Active (02/26/2021)   Exercise Vital Sign    Days of Exercise per Week: 3 days    Minutes of Exercise per Session: 30 min  Stress: No Stress Concern Present (02/26/2021)   [place]    Feeling of Stress : Only a little  Social Connections: Moderately Integrated (02/26/2021)   Social Connection and Isolation Panel [NHANES]    Frequency of Communication with Friends and Family: More than three times a week    Frequency of Social Gatherings with Friends and Family: Once a week    Attends Religious Services: More than 4 times per year    Active Member of [employer] or Organizations: No    Attends Archivist Meetings: Never    Marital Status: Married  Human resources officer Violence: Not At Risk (02/26/2021)   Humiliation, Afraid, Rape, and Kick questionnaire    Fear of Current or Ex-Partner: No    Emotionally Abused: No    Physically Abused: No    Sexually Abused: No    Review of Systems: See HPI, otherwise negative ROS  Physical Exam: BP 139/65   Pulse 78   Temp (!) 97 F (36.1 C) (Temporal)   Resp (!) 22   Ht '5\' 10"'$  (1.778 m)   Wt 115.7 kg   SpO2 98%   BMI 36.59 kg/m  General:    Alert, cooperative in NAD Head:  Normocephalic and atraumatic. Respiratory:  Normal work of breathing. Cardiovascular:  RRR  Impression/Plan: Brett Boone is here for cataract surgery.  Risks, benefits, limitations, and alternatives regarding cataract surgery have been reviewed with the patient.  Questions have been answered.  All parties agreeable.   Benay Pillow, MD  10/13/2021, 10:01 AM

## 2021-10-13 NOTE — Transfer of Care (Signed)
Immediate Anesthesia Transfer of Care Note  Patient: Brett Boone  Procedure(s) Performed: CATARACT EXTRACTION PHACO AND INTRAOCULAR LENS PLACEMENT (IOC) LEFT (Left: Eye)  Patient Location: PACU  Anesthesia Type: MAC  Level of Consciousness: awake, alert  and patient cooperative  Airway and Oxygen Therapy: Patient Spontanous Breathing and Patient connected to supplemental oxygen  Post-op Assessment: Post-op Vital signs reviewed, Patient's Cardiovascular Status Stable, Respiratory Function Stable, Patent Airway and No signs of Nausea or vomiting  Post-op Vital Signs: Reviewed and stable  Complications: No notable events documented.

## 2021-10-14 ENCOUNTER — Encounter: Payer: Self-pay | Admitting: Ophthalmology

## 2021-10-15 ENCOUNTER — Inpatient Hospital Stay: Payer: Medicare Other

## 2021-10-15 VITALS — BP 130/73 | HR 79

## 2021-10-15 DIAGNOSIS — D461 Refractory anemia with ring sideroblasts: Secondary | ICD-10-CM | POA: Diagnosis not present

## 2021-10-15 DIAGNOSIS — R5383 Other fatigue: Secondary | ICD-10-CM | POA: Diagnosis not present

## 2021-10-15 LAB — HEMOGLOBIN AND HEMATOCRIT, BLOOD
HCT: 29 % — ABNORMAL LOW (ref 39.0–52.0)
Hemoglobin: 9.6 g/dL — ABNORMAL LOW (ref 13.0–17.0)

## 2021-10-15 MED ORDER — DARBEPOETIN ALFA 300 MCG/0.6ML IJ SOSY
300.0000 ug | PREFILLED_SYRINGE | INTRAMUSCULAR | Status: DC
  Administered 2021-10-15: 300 ug via SUBCUTANEOUS
  Filled 2021-10-15: qty 0.6

## 2021-10-22 ENCOUNTER — Inpatient Hospital Stay: Payer: Medicare Other

## 2021-10-22 VITALS — BP 110/72 | HR 60

## 2021-10-22 DIAGNOSIS — R5383 Other fatigue: Secondary | ICD-10-CM | POA: Diagnosis not present

## 2021-10-22 DIAGNOSIS — D461 Refractory anemia with ring sideroblasts: Secondary | ICD-10-CM

## 2021-10-22 LAB — HEMOGLOBIN AND HEMATOCRIT, BLOOD
HCT: 30.3 % — ABNORMAL LOW (ref 39.0–52.0)
Hemoglobin: 9.9 g/dL — ABNORMAL LOW (ref 13.0–17.0)

## 2021-10-22 MED ORDER — DARBEPOETIN ALFA 300 MCG/0.6ML IJ SOSY
300.0000 ug | PREFILLED_SYRINGE | INTRAMUSCULAR | Status: DC
  Administered 2021-10-22: 300 ug via SUBCUTANEOUS
  Filled 2021-10-22: qty 0.6

## 2021-10-24 ENCOUNTER — Inpatient Hospital Stay: Payer: Medicare Other

## 2021-10-24 ENCOUNTER — Ambulatory Visit: Payer: Medicare Other

## 2021-10-24 ENCOUNTER — Other Ambulatory Visit: Payer: Medicare Other

## 2021-10-28 ENCOUNTER — Ambulatory Visit: Payer: Medicare Other | Admitting: Cardiovascular Disease

## 2021-10-29 ENCOUNTER — Inpatient Hospital Stay: Payer: Medicare Other | Attending: Nurse Practitioner

## 2021-10-29 ENCOUNTER — Inpatient Hospital Stay: Payer: Medicare Other

## 2021-10-29 VITALS — BP 132/80 | HR 64

## 2021-10-29 DIAGNOSIS — Z87891 Personal history of nicotine dependence: Secondary | ICD-10-CM | POA: Insufficient documentation

## 2021-10-29 DIAGNOSIS — D461 Refractory anemia with ring sideroblasts: Secondary | ICD-10-CM | POA: Insufficient documentation

## 2021-10-29 DIAGNOSIS — Z79899 Other long term (current) drug therapy: Secondary | ICD-10-CM | POA: Insufficient documentation

## 2021-10-29 LAB — HEMOGLOBIN AND HEMATOCRIT, BLOOD
HCT: 30.2 % — ABNORMAL LOW (ref 39.0–52.0)
Hemoglobin: 9.8 g/dL — ABNORMAL LOW (ref 13.0–17.0)

## 2021-10-29 MED ORDER — DARBEPOETIN ALFA 300 MCG/0.6ML IJ SOSY
300.0000 ug | PREFILLED_SYRINGE | INTRAMUSCULAR | Status: DC
  Administered 2021-10-29: 300 ug via SUBCUTANEOUS
  Filled 2021-10-29: qty 0.6

## 2021-11-05 ENCOUNTER — Inpatient Hospital Stay: Payer: Medicare Other

## 2021-11-05 VITALS — BP 124/80 | HR 60

## 2021-11-05 DIAGNOSIS — D461 Refractory anemia with ring sideroblasts: Secondary | ICD-10-CM

## 2021-11-05 DIAGNOSIS — Z79899 Other long term (current) drug therapy: Secondary | ICD-10-CM | POA: Diagnosis not present

## 2021-11-05 DIAGNOSIS — Z87891 Personal history of nicotine dependence: Secondary | ICD-10-CM | POA: Diagnosis not present

## 2021-11-05 LAB — HEMOGLOBIN AND HEMATOCRIT, BLOOD
HCT: 29.8 % — ABNORMAL LOW (ref 39.0–52.0)
Hemoglobin: 9.8 g/dL — ABNORMAL LOW (ref 13.0–17.0)

## 2021-11-05 MED ORDER — DARBEPOETIN ALFA 300 MCG/0.6ML IJ SOSY
300.0000 ug | PREFILLED_SYRINGE | INTRAMUSCULAR | Status: DC
  Administered 2021-11-05: 300 ug via SUBCUTANEOUS
  Filled 2021-11-05: qty 0.6

## 2021-11-14 ENCOUNTER — Other Ambulatory Visit: Payer: Medicare Other

## 2021-11-14 ENCOUNTER — Inpatient Hospital Stay (HOSPITAL_BASED_OUTPATIENT_CLINIC_OR_DEPARTMENT_OTHER): Payer: Medicare Other | Admitting: Nurse Practitioner

## 2021-11-14 ENCOUNTER — Encounter: Payer: Self-pay | Admitting: Nurse Practitioner

## 2021-11-14 ENCOUNTER — Ambulatory Visit: Payer: Medicare Other

## 2021-11-14 ENCOUNTER — Inpatient Hospital Stay: Payer: Medicare Other

## 2021-11-14 VITALS — BP 136/67 | HR 68 | Temp 97.4°F | Resp 17 | Wt 259.4 lb

## 2021-11-14 DIAGNOSIS — Z79899 Other long term (current) drug therapy: Secondary | ICD-10-CM | POA: Diagnosis not present

## 2021-11-14 DIAGNOSIS — D461 Refractory anemia with ring sideroblasts: Secondary | ICD-10-CM

## 2021-11-14 DIAGNOSIS — Z87891 Personal history of nicotine dependence: Secondary | ICD-10-CM | POA: Diagnosis not present

## 2021-11-14 DIAGNOSIS — D649 Anemia, unspecified: Secondary | ICD-10-CM

## 2021-11-14 LAB — COMPREHENSIVE METABOLIC PANEL
ALT: 18 U/L (ref 0–44)
AST: 24 U/L (ref 15–41)
Albumin: 3.9 g/dL (ref 3.5–5.0)
Alkaline Phosphatase: 40 U/L (ref 38–126)
Anion gap: 6 (ref 5–15)
BUN: 21 mg/dL (ref 8–23)
CO2: 26 mmol/L (ref 22–32)
Calcium: 8.8 mg/dL — ABNORMAL LOW (ref 8.9–10.3)
Chloride: 103 mmol/L (ref 98–111)
Creatinine, Ser: 1.04 mg/dL (ref 0.61–1.24)
GFR, Estimated: >60 mL/min (ref >60)
Glucose, Bld: 148 mg/dL — ABNORMAL HIGH (ref 70–99)
Potassium: 4.3 mmol/L (ref 3.5–5.1)
Sodium: 135 mmol/L (ref 135–145)
Total Bilirubin: 1.1 mg/dL (ref 0.3–1.2)
Total Protein: 6.6 g/dL (ref 6.5–8.1)

## 2021-11-14 LAB — CBC WITH DIFFERENTIAL/PLATELET
Abs Immature Granulocytes: 0.02 10*3/uL (ref 0.00–0.07)
Basophils Absolute: 0.1 10*3/uL (ref 0.0–0.1)
Basophils Relative: 1 %
Eosinophils Absolute: 0.1 10*3/uL (ref 0.0–0.5)
Eosinophils Relative: 2 %
HCT: 30.2 % — ABNORMAL LOW (ref 39.0–52.0)
Hemoglobin: 9.9 g/dL — ABNORMAL LOW (ref 13.0–17.0)
Immature Granulocytes: 0 %
Lymphocytes Relative: 20 %
Lymphs Abs: 1.1 10*3/uL (ref 0.7–4.0)
MCH: 35.2 pg — ABNORMAL HIGH (ref 26.0–34.0)
MCHC: 32.8 g/dL (ref 30.0–36.0)
MCV: 107.5 fL — ABNORMAL HIGH (ref 80.0–100.0)
Monocytes Absolute: 0.5 10*3/uL (ref 0.1–1.0)
Monocytes Relative: 9 %
Neutro Abs: 3.8 10*3/uL (ref 1.7–7.7)
Neutrophils Relative %: 68 %
Platelets: 284 10*3/uL (ref 150–400)
RBC: 2.81 MIL/uL — ABNORMAL LOW (ref 4.22–5.81)
RDW: 21.2 % — ABNORMAL HIGH (ref 11.5–15.5)
WBC: 5.6 10*3/uL (ref 4.0–10.5)
nRBC: 0.9 % — ABNORMAL HIGH (ref 0.0–0.2)

## 2021-11-14 LAB — LACTATE DEHYDROGENASE: LDH: 112 U/L (ref 98–192)

## 2021-11-14 MED ORDER — DARBEPOETIN ALFA 300 MCG/0.6ML IJ SOSY
300.0000 ug | PREFILLED_SYRINGE | INTRAMUSCULAR | Status: DC
  Administered 2021-11-14: 300 ug via SUBCUTANEOUS
  Filled 2021-11-14: qty 0.6

## 2021-11-14 NOTE — Progress Notes (Signed)
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 Bone marrow biopsy on 09/11/2019 revealed macrocytic anemia and hypercellular bone marrow for age with dyspoietic changes primarily involving the erythroid cell lines associated with abundant ring sideroblasts (> 15%).  There was no increase in blasts. Flow cytometry was negative. Cytogenetics revealed 45,X,-Y [20].  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 IPSS score is low.   Baseline EPO level was 63.3 in August 2021.  Patient received Aranesp for a month in September 2021 and was then switched to [place].  He is presently on weekly Retacrit 20,000 units.   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   MRI abdomen in September 2021 showed 1.3 cm lesion in the left kidney concerning for renal cell carcinoma for which he follows up with urology    Interval history- Previous concern for progression on aranesp. Aranesp was changed to weekly administration. Patient feeling better. Fatigue has improved. No new complaints.   ECOG PS- 1 Pain scale- 0 Opioid associated constipation- no  Review of systems- Review of Systems  Constitutional:  Positive for malaise/fatigue. Negative for  chills, fever and weight loss.  HENT:  Negative for congestion, ear discharge and nosebleeds.   Eyes:  Negative for blurred vision.  Respiratory:  Negative for cough, hemoptysis, sputum production, shortness of breath and wheezing.   Cardiovascular:  Negative for chest pain, palpitations, orthopnea and claudication.  Gastrointestinal:  Negative for abdominal pain, blood in stool, constipation, diarrhea, heartburn, melena, nausea and vomiting.  Genitourinary:  Negative for dysuria, flank pain, frequency, hematuria and urgency.  Musculoskeletal:  Negative for back pain, joint pain and myalgias.  Skin:  Negative for rash.  Neurological:  Negative for dizziness, tingling, focal weakness, seizures, weakness and headaches.  Endo/Heme/Allergies:  Does not bruise/bleed easily.  Psychiatric/Behavioral:  Negative for depression and suicidal ideas. The patient does not have insomnia.       Allergies  Allergen Reactions   Amoxicillin-Pot Clavulanate Hives and Nausea And Vomiting     Past Medical History:  Diagnosis Date   Anemia    Heart murmur    Hydrocele    MDS (myelodysplastic syndrome) (HCC)    Rotator cuff tear arthropathy of right shoulder 08/01/2020   Venous stasis dermatitis of both lower extremities      Past Surgical History:  Procedure Laterality Date   CATARACT EXTRACTION W/PHACO Right 03/09/2017   Procedure: CATARACT EXTRACTION PHACO AND INTRAOCULAR LENS PLACEMENT ([place]) RIGHT;  Surgeon: Eulogio Bear, MD;  Location: [place];  Service: Ophthalmology;  Laterality: Right;   CATARACT EXTRACTION W/PHACO Left 08/25/2021   Procedure: CATARACT EXTRACTION PHACO AND INTRAOCULAR LENS PLACEMENT (IOC) LEFT;  Surgeon: Eulogio Bear, MD;  Location: [place];  Service: Ophthalmology;  Laterality: Left;   CATARACT EXTRACTION W/PHACO Left 10/13/2021  Procedure: CATARACT EXTRACTION PHACO AND INTRAOCULAR LENS PLACEMENT ([place]) LEFT;  Surgeon: Eulogio Bear, MD;   Location: [place];  Service: Ophthalmology;  Laterality: Left;  8.12 0:52.0   COLONOSCOPY  2013   normal- cleared for 5 yrs- Dr Beckey Downing   COLONOSCOPY WITH PROPOFOL N/A 02/17/2017   Procedure: COLONOSCOPY WITH PROPOFOL;  Surgeon: Lin Landsman, MD;  Location: [place];  Service: Endoscopy;  Laterality: N/A;   COLONOSCOPY WITH PROPOFOL N/A 10/12/2019   Procedure: COLONOSCOPY WITH BIOPSY;  Surgeon: Lin Landsman, MD;  Location: [place];  Service: Endoscopy;  Laterality: N/A;   EPIGASTRIC HERNIA REPAIR N/A 07/06/2014   Procedure: HERNIA REPAIR EPIGASTRIC ADULT;  Surgeon: Molly Maduro, MD;  Location: [facility];  Service: General;  Laterality: N/A;   HERNIA REPAIR Bilateral    INSERTION OF MESH N/A 07/06/2014   Procedure: INSERTION OF MESH;  Surgeon: Molly Maduro, MD;  Location: [facility];  Service: General;  Laterality: N/A;   IR RADIOLOGIST EVAL & MGMT  11/07/2019   IR RADIOLOGIST EVAL & MGMT  04/25/2020   IR RADIOLOGIST EVAL & MGMT  12/05/2020   IR RADIOLOGIST EVAL & MGMT  06/30/2021   POLYPECTOMY  02/17/2017   Procedure: POLYPECTOMY;  Surgeon: Lin Landsman, MD;  Location: [place];  Service: Endoscopy;;   POLYPECTOMY N/A 10/12/2019   Procedure: POLYPECTOMY;  Surgeon: Lin Landsman, MD;  Location: [place];  Service: Endoscopy;  Laterality: N/A;   SHOULDER ARTHROSCOPY Left 2008   SINUS EXPLORATION      Social History   Socioeconomic History   Marital status: Married    Spouse name: Not on file   Number of children: 3   Years of education: Not on file   Highest education level: Bachelor's degree (e.g., BA, AB, BS)  Occupational History   Occupation: Retired  Tobacco Use   Smoking status: Former    Packs/day: 0.25    Years: 50.00    Total pack years: 12.50    Types: Cigarettes    Quit date: 05/03/2018    Years since quitting: 3.5   Smokeless tobacco: Former   Tobacco comments:    pt quit smoking   Vaping Use   Vaping Use: Never used  Substance and Sexual Activity   Alcohol use: Yes    Comment: Occasional -1x/month   Drug use: No   Sexual activity: Yes  Other Topics Concern   Not on file  Social History Narrative   Not on file   Social Determinants of Health   Financial Resource Strain: Low Risk  (02/26/2021)   Overall Financial Resource Strain (CARDIA)    Difficulty of Paying Living Expenses: Not hard at all  Food Insecurity: No Food Insecurity (02/26/2021)   Hunger Vital Sign    Worried About Running Out of Food in the Last Year: Never true    [place] in the Last Year: Never true  Transportation Needs: No Transportation Needs (02/26/2021)   PRAPARE - Hydrologist (Medical): No    Lack of Transportation (Non-Medical): No  Physical Activity: Insufficiently Active (02/26/2021)   Exercise Vital Sign    Days of Exercise per Week: 3 days    Minutes of Exercise per Session: 30 min  Stress: No Stress Concern Present (02/26/2021)   [place]    Feeling of Stress : Only a little  Social Connections: Moderately Integrated (02/26/2021)  Social Licensed conveyancer [NHANES]    Frequency of Communication with Friends and Family: More than three times a week    Frequency of Social Gatherings with Friends and Family: Once a week    Attends Religious Services: More than 4 times per year    Active Member of [employer] or Organizations: No    Attends Archivist Meetings: Never    Marital Status: Married  Human resources officer Violence: Not At Risk (02/26/2021)   Humiliation, Afraid, Rape, and Kick questionnaire    Fear of Current or Ex-Partner: No    Emotionally Abused: No    Physically Abused: No    Sexually Abused: No    Family History  Problem Relation Age of Onset   Diabetes Father    Heart disease Father 89   Stroke Mother      Current Outpatient Medications:     acetaminophen (TYLENOL) 325 MG tablet, Take 650 mg by mouth every 6 (six) hours as needed., Disp: , Rfl:    Calcium Carb-Cholecalciferol 600-400 MG-UNIT TABS, Take by mouth., Disp: , Rfl:    Epoetin Alfa-epbx (RETACRIT IJ), , Disp: , Rfl:    ibuprofen (ADVIL) 200 MG tablet, Take 200 mg by mouth every 6 (six) hours as needed., Disp: , Rfl:    tadalafil (CIALIS) 5 MG tablet, TAKE ONE TABLET BY MOUTH DAILY, Disp: 30 tablet, Rfl: 11   tadalafil (CIALIS) 5 MG tablet, Take 1 tablet by mouth daily., Disp: , Rfl:  No current facility-administered medications for this visit.  Facility-Administered Medications Ordered in Other Visits:    Darbepoetin Alfa (ARANESP) injection 300 mcg, 300 mcg, Subcutaneous, Weekly, Sindy Guadeloupe, MD   epoetin alfa-epbx (RETACRIT) injection 40,000 Units, 40,000 Units, Subcutaneous, Weekly, Sindy Guadeloupe, MD, 40,000 Units at 04/02/21 1523  Physical exam:  Vitals:   11/14/21 1435  BP: 136/67  Pulse: 68  Resp: 17  Temp: (!) 97.4 F (36.3 C)  SpO2: 100%  Weight: 259 lb 6.4 oz (117.7 kg)   Physical Exam Constitutional:      General: He is not in acute distress. Cardiovascular:     Rate and Rhythm: Normal rate and regular rhythm.     Heart sounds: Normal heart sounds.  Pulmonary:     Effort: Pulmonary effort is normal.     Breath sounds: Normal breath sounds.  Abdominal:     General: Bowel sounds are normal.     Palpations: Abdomen is soft.     Comments: No palpable hepatosplenomegaly  Skin:    General: Skin is warm and dry.  Neurological:     Mental Status: He is alert and oriented to person, place, and time.         Latest Ref Rng & Units 11/14/2021    1:50 PM  CMP  Glucose 70 - 99 mg/dL 148   BUN 8 - 23 mg/dL 21   Creatinine 0.61 - 1.24 mg/dL 1.04   Sodium 135 - 145 mmol/L 135   Potassium 3.5 - 5.1 mmol/L 4.3   Chloride 98 - 111 mmol/L 103   CO2 22 - 32 mmol/L 26   Calcium 8.9 - 10.3 mg/dL 8.8   Total Protein 6.5 - 8.1 g/dL 6.6   Total  Bilirubin 0.3 - 1.2 mg/dL 1.1   Alkaline Phos 38 - 126 U/L 40   AST 15 - 41 U/L 24   ALT 0 - 44 U/L 18       Latest Ref Rng & Units 11/14/2021  1:50 PM  CBC  WBC 4.0 - 10.5 K/uL 5.6   Hemoglobin 13.0 - 17.0 g/dL 9.9   Hematocrit 39.0 - 52.0 % 30.2   Platelets 150 - 400 K/uL 284        Assessment and plan- Patient is a 76 y.o. male with low risk MDS with SF 3 B1 mutation here for routine follow-up. He's responded well to changing aranesp to weekly. Hmg 9.9 today. Hold off on second line luspatrecept today. Continue weekly aranesp for hmg < 10. Follow up with Dr. Janese Banks in 1 month or sooner if counts persistently < 9.   Prefers Wednesday as treatment day.   Disposition: Aranesp today 1 week- lab (h&H), +/- aranesp 2 weeks - lab (h&H), +/- aranesp 3 weeks - lab (h&H), +/- aranesp 4 weeks - lab (cbc, cmp), Janese Banks, +/- aranesp- la  Visit Diagnosis 1. 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

## 2021-11-14 NOTE — Progress Notes (Signed)
Feeling stable. Denies weaknes or dyspnea. No obvious signs of blood loss. Receiving shots on weekly basis for anemia.

## 2021-11-20 ENCOUNTER — Inpatient Hospital Stay: Payer: Medicare Other

## 2021-11-20 DIAGNOSIS — Z79899 Other long term (current) drug therapy: Secondary | ICD-10-CM | POA: Diagnosis not present

## 2021-11-20 DIAGNOSIS — D461 Refractory anemia with ring sideroblasts: Secondary | ICD-10-CM | POA: Diagnosis not present

## 2021-11-20 DIAGNOSIS — Z87891 Personal history of nicotine dependence: Secondary | ICD-10-CM | POA: Diagnosis not present

## 2021-11-20 LAB — HEMOGLOBIN AND HEMATOCRIT, BLOOD
HCT: 31.1 % — ABNORMAL LOW (ref 39.0–52.0)
Hemoglobin: 10.1 g/dL — ABNORMAL LOW (ref 13.0–17.0)

## 2021-11-20 NOTE — Progress Notes (Signed)
Hg=10.1. Per treatment parameters, Aranesp not indicated today. Pt informed.

## 2021-11-27 ENCOUNTER — Inpatient Hospital Stay: Payer: Medicare Other

## 2021-11-27 ENCOUNTER — Inpatient Hospital Stay: Payer: Medicare Other | Attending: Nurse Practitioner

## 2021-11-27 ENCOUNTER — Ambulatory Visit
Admission: RE | Admit: 2021-11-27 | Discharge: 2021-11-27 | Disposition: A | Discharge status: Home / Self Care | Payer: Medicare Other | Source: Ambulatory Visit | Attending: Family Medicine | Admitting: Family Medicine

## 2021-11-27 ENCOUNTER — Ambulatory Visit
Admission: RE | Admit: 2021-11-27 | Discharge: 2021-11-27 | Disposition: A | Discharge status: Home / Self Care | Payer: Medicare Other | Attending: Family Medicine | Admitting: Family Medicine

## 2021-11-27 ENCOUNTER — Ambulatory Visit (INDEPENDENT_AMBULATORY_CARE_PROVIDER_SITE_OTHER): Payer: Medicare Other | Admitting: Family Medicine

## 2021-11-27 ENCOUNTER — Encounter: Payer: Self-pay | Admitting: Family Medicine

## 2021-11-27 VITALS — BP 133/80 | HR 68 | Resp 20

## 2021-11-27 VITALS — BP 102/68 | HR 81 | Ht 70.0 in | Wt 264.0 lb

## 2021-11-27 DIAGNOSIS — M5432 Sciatica, left side: Secondary | ICD-10-CM

## 2021-11-27 DIAGNOSIS — M545 Low back pain, unspecified: Secondary | ICD-10-CM | POA: Diagnosis not present

## 2021-11-27 DIAGNOSIS — D461 Refractory anemia with ring sideroblasts: Secondary | ICD-10-CM | POA: Diagnosis not present

## 2021-11-27 LAB — HEMOGLOBIN AND HEMATOCRIT, BLOOD
HCT: 28.4 % — ABNORMAL LOW (ref 39.0–52.0)
Hemoglobin: 9.1 g/dL — ABNORMAL LOW (ref 13.0–17.0)

## 2021-11-27 MED ORDER — MELOXICAM 15 MG PO TABS: 15.0000 mg | ORAL_TABLET | Freq: Every day | ORAL | 0 refills | Status: DC

## 2021-11-27 MED ORDER — PREDNISONE 10 MG PO TABS: 10.0000 mg | ORAL_TABLET | Freq: Every day | ORAL | 0 refills | Status: DC

## 2021-11-27 MED ORDER — DARBEPOETIN ALFA 300 MCG/0.6ML IJ SOSY
300.0000 ug | PREFILLED_SYRINGE | INTRAMUSCULAR | Status: DC
  Administered 2021-11-27: 300 ug via SUBCUTANEOUS
  Filled 2021-11-27: qty 0.6

## 2021-11-27 NOTE — Progress Notes (Signed)
Date:  11/27/2021   Name:  Brett Boone   DOB:  03-17-1945   MRN:  [id number]   Chief Complaint: Hip Pain (Ibuprofen and tylenol together works best "one of each"- L) hip started hurting 3 months ago. "Can't do anything long" )  Hip Pain  Incident onset: right buttock pain. There was no injury mechanism. The quality of the pain is described as aching. The pain is at a severity of 7/10 (at worse). The pain is moderate. The pain has been Fluctuating since onset. Pertinent negatives include no inability to bear weight, loss of motion, loss of sensation, muscle weakness, numbness or tingling. He has tried NSAIDs and acetaminophen for the symptoms. The treatment provided moderate relief.    Lab Results  Component Value Date   NA 135 11/14/2021   K 4.3 11/14/2021   CO2 26 11/14/2021   GLUCOSE 148 (H) 11/14/2021   BUN 21 11/14/2021   CREATININE 1.04 11/14/2021   CALCIUM 8.8 (L) 11/14/2021   GFRNONAA >60 11/14/2021   Lab Results  Component Value Date   CHOL 62 (L) 07/25/2021   HDL 47 07/25/2021   LDLCALC 3 07/25/2021   TRIG 34 07/25/2021   CHOLHDL 1.4 01/04/2018   Lab Results  Component Value Date   TSH 1.275 08/15/2019   Lab Results  Component Value Date   HGBA1C 5.8 (H) 08/30/2014   Lab Results  Component Value Date   WBC 5.6 11/14/2021   HGB 9.1 (L) 11/27/2021   HCT 28.4 (L) 11/27/2021   MCV 107.5 (H) 11/14/2021   PLT 284 11/14/2021   Lab Results  Component Value Date   ALT 18 11/14/2021   AST 24 11/14/2021   ALKPHOS 40 11/14/2021   BILITOT 1.1 11/14/2021   No results found for: "25OHVITD2", "25OHVITD3", "VD25OH"   Review of Systems  Constitutional:  Negative for chills and fever.  HENT:  Negative for drooling, ear discharge, ear pain and sore throat.   Respiratory:  Negative for cough, shortness of breath and wheezing.   Cardiovascular:  Negative for chest pain, palpitations and leg swelling.  Gastrointestinal:  Negative for abdominal pain, blood in  stool, constipation, diarrhea and nausea.  Endocrine: Negative for polydipsia and polyuria.  Genitourinary:  Negative for difficulty urinating, dysuria, frequency, hematuria and urgency.  Musculoskeletal:  Negative for back pain, myalgias and neck pain.  Skin:  Negative for rash.  Allergic/Immunologic: Negative for environmental allergies.  Neurological:  Negative for dizziness, tingling, numbness and headaches.  Hematological:  Does not bruise/bleed easily.  Psychiatric/Behavioral:  Negative for suicidal ideas. The patient is not nervous/anxious.     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   Macrocytic anemia 08/15/2019   Weight loss 08/15/2019   Elevated ferritin 08/15/2019   Erectile dysfunction 07/25/2019   Class 2 severe obesity due to excess calories with serious comorbidity and body mass index (BMI) of 36.0 to 36.9 in adult [facility]) 07/19/2019   History of adenomatous polyp of colon    Annual physical exam 08/30/2014   Epigastric hernia 07/18/2014    Allergies  Allergen Reactions   Amoxicillin-Pot Clavulanate Hives and Nausea And Vomiting    Past Surgical History:  Procedure Laterality Date   CATARACT EXTRACTION W/PHACO Right 03/09/2017   Procedure: CATARACT EXTRACTION PHACO AND  INTRAOCULAR LENS PLACEMENT (IOC) RIGHT;  Surgeon: Eulogio Bear, MD;  Location: [place];  Service: Ophthalmology;  Laterality: Right;   CATARACT EXTRACTION W/PHACO Left 08/25/2021   Procedure: CATARACT EXTRACTION PHACO AND INTRAOCULAR LENS PLACEMENT (IOC) LEFT;  Surgeon: Eulogio Bear, MD;  Location: [place];  Service: Ophthalmology;  Laterality: Left;   CATARACT EXTRACTION W/PHACO Left 10/13/2021   Procedure: CATARACT EXTRACTION PHACO AND INTRAOCULAR LENS  PLACEMENT ([place]) LEFT;  Surgeon: Eulogio Bear, MD;  Location: [place];  Service: Ophthalmology;  Laterality: Left;  8.12 0:52.0   COLONOSCOPY  2013   normal- cleared for 5 yrs- Dr Beckey Downing   COLONOSCOPY WITH PROPOFOL N/A 02/17/2017   Procedure: COLONOSCOPY WITH PROPOFOL;  Surgeon: Lin Landsman, MD;  Location: [place];  Service: Endoscopy;  Laterality: N/A;   COLONOSCOPY WITH PROPOFOL N/A 10/12/2019   Procedure: COLONOSCOPY WITH BIOPSY;  Surgeon: Lin Landsman, MD;  Location: [place];  Service: Endoscopy;  Laterality: N/A;   EPIGASTRIC HERNIA REPAIR N/A 07/06/2014   Procedure: HERNIA REPAIR EPIGASTRIC ADULT;  Surgeon: Molly Maduro, MD;  Location: [facility];  Service: General;  Laterality: N/A;   HERNIA REPAIR Bilateral    INSERTION OF MESH N/A 07/06/2014   Procedure: INSERTION OF MESH;  Surgeon: Molly Maduro, MD;  Location: [facility];  Service: General;  Laterality: N/A;   IR RADIOLOGIST EVAL & MGMT  11/07/2019   IR RADIOLOGIST EVAL & MGMT  04/25/2020   IR RADIOLOGIST EVAL & MGMT  12/05/2020   IR RADIOLOGIST EVAL & MGMT  06/30/2021   POLYPECTOMY  02/17/2017   Procedure: POLYPECTOMY;  Surgeon: Lin Landsman, MD;  Location: [place];  Service: Endoscopy;;   POLYPECTOMY N/A 10/12/2019   Procedure: POLYPECTOMY;  Surgeon: Lin Landsman, MD;  Location: [place];  Service: Endoscopy;  Laterality: N/A;   SHOULDER ARTHROSCOPY Left 2008   SINUS EXPLORATION      Social History   Tobacco Use   Smoking status: Former    Packs/day: 0.25    Years: 50.00    Total pack years: 12.50    Types: Cigarettes    Quit date: 05/03/2018    Years since quitting: 3.5   Smokeless tobacco: Former   Tobacco comments:    pt quit smoking  Vaping Use   Vaping Use: Never used  Substance Use Topics   Alcohol use: Yes    Comment: Occasional -1x/month   Drug use: No     Medication list has been reviewed and updated.  Current  Meds  Medication Sig   acetaminophen (TYLENOL) 325 MG tablet Take 650 mg by mouth every 6 (six) hours as needed.   Calcium Carb-Cholecalciferol 600-400 MG-UNIT TABS Take by mouth.   Epoetin Alfa-epbx (RETACRIT IJ)    ibuprofen (ADVIL) 200 MG tablet Take 200 mg by mouth every 6 (six) hours as needed.   tadalafil (CIALIS) 5 MG tablet TAKE ONE TABLET BY MOUTH DAILY   tadalafil (CIALIS) 5 MG tablet Take 1 tablet by mouth daily.       11/27/2021    3:23 PM 08/26/2021    2:02 PM 07/25/2021    8:24 AM 07/23/2020    8:47 AM  GAD 7 : Generalized Anxiety Score  Nervous, Anxious, on Edge 0 0 0 0  Control/stop worrying 0 0 0 0  Worry too much - different things 0 0 0 0  Trouble relaxing 0 0 0 0  Restless 0 0 0 0  Easily annoyed or irritable 0 0 0 0  Afraid - awful might happen 0 0 0 0  Total GAD 7 Score 0 0 0 0  Anxiety Difficulty Not difficult at all Not difficult at all Not difficult at all        11/27/2021    3:22 PM 08/26/2021    2:01 PM 07/25/2021    8:24 AM  Depression screen PHQ 2/9  Decreased Interest 0 3 0  Down, Depressed, Hopeless 0 3 0  PHQ - 2 Score 0 6 0  Altered sleeping 0 3 2  Tired, decreased energy 0 3 0  Change in appetite 0 0 0  Feeling bad or failure about yourself  0 0 0  Trouble concentrating 0 0 0  Moving slowly or fidgety/restless 0 0 0  Suicidal thoughts 0 0 0  PHQ-9 Score 0 12 2  Difficult doing work/chores Not difficult at all Somewhat difficult Not difficult at all    BP Readings from Last 3 Encounters:  11/27/21 102/68  11/27/21 133/80  11/14/21 136/67    Physical Exam Vitals and nursing note reviewed.  HENT:     Head: Normocephalic.     Right Ear: Tympanic membrane and external ear normal.     Left Ear: Tympanic membrane and external ear normal.     Nose: Nose normal.     Mouth/Throat:     Mouth: Mucous membranes are moist.  Eyes:     General: No scleral icterus.       Right eye: No discharge.        Left eye: No discharge.      Conjunctiva/sclera: Conjunctivae normal.     Pupils: Pupils are equal, round, and reactive to light.  Neck:     Thyroid: No thyromegaly.     Vascular: No JVD.     Trachea: No tracheal deviation.  Cardiovascular:     Rate and Rhythm: Normal rate and regular rhythm.     Heart sounds: Normal heart sounds. No murmur heard.    No friction rub. No gallop.  Pulmonary:     Effort: No respiratory distress.     Breath sounds: Normal breath sounds. No wheezing, rhonchi or rales.  Abdominal:     General: Bowel sounds are normal.     Palpations: Abdomen is soft. There is no mass.     Tenderness: There is no abdominal tenderness. There is no guarding or rebound.  Musculoskeletal:        General: No tenderness.     Cervical back: Normal range of motion and neck supple.     Lumbar back: No signs of trauma, tenderness or bony tenderness. Positive left straight leg raise test. Negative right straight leg raise test.  Lymphadenopathy:     Cervical: No cervical adenopathy.  Skin:    General: Skin is warm.     Findings: No rash.  Neurological:     Mental Status: He is alert and oriented to person, place, and time.     Cranial Nerves: No cranial nerve deficit.     Sensory: Sensation is intact.     Motor: Motor function is intact. No weakness.     Coordination: Coordination normal.     Deep Tendon Reflexes: Reflexes are normal and symmetric.     Wt Readings from Last 3 Encounters:  11/27/21 264 lb (119.7 kg)  11/14/21 259 lb 6.4 oz (117.7 kg)  10/13/21 255 lb (115.7 kg)    BP 102/68   Pulse 81  Ht '5\' 10"'$  (1.778 m)   Wt 264 lb (119.7 kg)   SpO2 96%   BMI 37.88 kg/m   Assessment and Plan:  1. Sciatica of left side New onset.  Persistent.  Waxes and wanes in intensity.  Patient has had left buttock pain worsens with weightbearing activity.  Unassociated with any sensory or motor symptomatology  We will obtain an LS spine x-ray to rule out osteolytic osteoblastic lesions given patient's  history of anemia.  We will treat with meloxicam 15 mg once a day and prednisone 10 mg once a day.  We are considering initiating physical therapy for sciatica and if unresolved will consider referral to orthopedics presumably [facility]. - meloxicam (MOBIC) 15 MG tablet; Take 1 tablet (15 mg total) by mouth daily.  Dispense: 30 tablet; Refill: 0 - predniSONE (DELTASONE) 10 MG tablet; Take 1 tablet (10 mg total) by mouth daily with breakfast.  Dispense: 30 tablet; Refill: 0 - DG Lumbar Spine Complete; Future   Otilio Miu, MD

## 2021-12-01 ENCOUNTER — Other Ambulatory Visit: Payer: Self-pay

## 2021-12-01 DIAGNOSIS — M5432 Sciatica, left side: Secondary | ICD-10-CM

## 2021-12-01 NOTE — Progress Notes (Signed)
Ref placed to PT

## 2021-12-04 ENCOUNTER — Inpatient Hospital Stay: Payer: Medicare Other

## 2021-12-04 VITALS — BP 141/83 | HR 63

## 2021-12-04 DIAGNOSIS — D461 Refractory anemia with ring sideroblasts: Secondary | ICD-10-CM

## 2021-12-04 LAB — CBC WITH DIFFERENTIAL/PLATELET
Abs Immature Granulocytes: 0.1 10*3/uL — ABNORMAL HIGH (ref 0.00–0.07)
Basophils Absolute: 0 10*3/uL (ref 0.0–0.1)
Basophils Relative: 1 %
Eosinophils Absolute: 0.1 10*3/uL (ref 0.0–0.5)
Eosinophils Relative: 1 %
HCT: 27.7 % — ABNORMAL LOW (ref 39.0–52.0)
Hemoglobin: 8.7 g/dL — ABNORMAL LOW (ref 13.0–17.0)
Immature Granulocytes: 1 %
Lymphocytes Relative: 15 %
Lymphs Abs: 1.1 10*3/uL (ref 0.7–4.0)
MCH: 34 pg (ref 26.0–34.0)
MCHC: 31.4 g/dL (ref 30.0–36.0)
MCV: 108.2 fL — ABNORMAL HIGH (ref 80.0–100.0)
Monocytes Absolute: 0.4 10*3/uL (ref 0.1–1.0)
Monocytes Relative: 6 %
Neutro Abs: 5.4 10*3/uL (ref 1.7–7.7)
Neutrophils Relative %: 76 %
Platelets: 314 10*3/uL (ref 150–400)
RBC: 2.56 MIL/uL — ABNORMAL LOW (ref 4.22–5.81)
RDW: 20.5 % — ABNORMAL HIGH (ref 11.5–15.5)
WBC: 7.1 10*3/uL (ref 4.0–10.5)
nRBC: 0.3 % — ABNORMAL HIGH (ref 0.0–0.2)

## 2021-12-04 MED ORDER — DARBEPOETIN ALFA 300 MCG/0.6ML IJ SOSY
300.0000 ug | PREFILLED_SYRINGE | INTRAMUSCULAR | Status: DC
  Administered 2021-12-04: 300 ug via SUBCUTANEOUS
  Filled 2021-12-04: qty 0.6

## 2021-12-05 ENCOUNTER — Other Ambulatory Visit: Payer: Medicare Other

## 2021-12-05 ENCOUNTER — Ambulatory Visit: Payer: Medicare Other | Admitting: Oncology

## 2021-12-05 ENCOUNTER — Ambulatory Visit: Payer: Medicare Other

## 2021-12-12 ENCOUNTER — Inpatient Hospital Stay: Payer: Medicare Other

## 2021-12-12 ENCOUNTER — Encounter: Payer: Self-pay | Admitting: Oncology

## 2021-12-12 ENCOUNTER — Ambulatory Visit: Payer: Medicare Other | Admitting: Oncology

## 2021-12-12 ENCOUNTER — Telehealth: Payer: Self-pay | Admitting: Family Medicine

## 2021-12-12 ENCOUNTER — Ambulatory Visit (INDEPENDENT_AMBULATORY_CARE_PROVIDER_SITE_OTHER): Payer: Medicare Other | Admitting: Family Medicine

## 2021-12-12 ENCOUNTER — Ambulatory Visit: Payer: Medicare Other

## 2021-12-12 ENCOUNTER — Inpatient Hospital Stay (HOSPITAL_BASED_OUTPATIENT_CLINIC_OR_DEPARTMENT_OTHER): Payer: Medicare Other | Admitting: Oncology

## 2021-12-12 ENCOUNTER — Encounter: Payer: Self-pay | Admitting: Family Medicine

## 2021-12-12 ENCOUNTER — Other Ambulatory Visit: Payer: Medicare Other

## 2021-12-12 VITALS — BP 124/78 | HR 68 | Ht 70.0 in | Wt 260.0 lb

## 2021-12-12 VITALS — BP 139/70 | HR 62 | Temp 96.7°F | Wt 261.7 lb

## 2021-12-12 DIAGNOSIS — Z79899 Other long term (current) drug therapy: Secondary | ICD-10-CM

## 2021-12-12 DIAGNOSIS — D461 Refractory anemia with ring sideroblasts: Secondary | ICD-10-CM

## 2021-12-12 DIAGNOSIS — L97319 Non-pressure chronic ulcer of right ankle with unspecified severity: Secondary | ICD-10-CM

## 2021-12-12 DIAGNOSIS — D649 Anemia, unspecified: Secondary | ICD-10-CM | POA: Diagnosis not present

## 2021-12-12 DIAGNOSIS — I83013 Varicose veins of right lower extremity with ulcer of ankle: Secondary | ICD-10-CM | POA: Diagnosis not present

## 2021-12-12 DIAGNOSIS — I872 Venous insufficiency (chronic) (peripheral): Secondary | ICD-10-CM | POA: Diagnosis not present

## 2021-12-12 LAB — HEMOGLOBIN AND HEMATOCRIT, BLOOD
HCT: 31.3 % — ABNORMAL LOW (ref 39.0–52.0)
Hemoglobin: 9.8 g/dL — ABNORMAL LOW (ref 13.0–17.0)

## 2021-12-12 MED ORDER — DARBEPOETIN ALFA 300 MCG/0.6ML IJ SOSY
300.0000 ug | PREFILLED_SYRINGE | INTRAMUSCULAR | Status: DC
  Administered 2021-12-12: 300 ug via SUBCUTANEOUS
  Filled 2021-12-12: qty 0.6

## 2021-12-12 MED ORDER — DOXYCYCLINE HYCLATE 100 MG PO TABS: 100.0000 mg | ORAL_TABLET | Freq: Two times a day (BID) | ORAL | 0 refills | Status: DC

## 2021-12-12 MED ORDER — MUPIROCIN 2 % EX OINT: 1.0000 | TOPICAL_OINTMENT | Freq: Two times a day (BID) | CUTANEOUS | 0 refills | Status: DC

## 2021-12-12 NOTE — Progress Notes (Addendum)
Date:  12/12/2021   Name:  Brett Boone   DOB:  Mar 07, 1945   MRN:  [id number]   Chief Complaint: ankle sore (Feels tight but not sore)  HPI  Lab Results  Component Value Date   NA 135 11/14/2021   K 4.3 11/14/2021   CO2 26 11/14/2021   GLUCOSE 148 (H) 11/14/2021   BUN 21 11/14/2021   CREATININE 1.04 11/14/2021   CALCIUM 8.8 (L) 11/14/2021   GFRNONAA >60 11/14/2021   Lab Results  Component Value Date   CHOL 62 (L) 07/25/2021   HDL 47 07/25/2021   LDLCALC 3 07/25/2021   TRIG 34 07/25/2021   CHOLHDL 1.4 01/04/2018   Lab Results  Component Value Date   TSH 1.275 08/15/2019   Lab Results  Component Value Date   HGBA1C 5.8 (H) 08/30/2014   Lab Results  Component Value Date   WBC 7.1 12/04/2021   HGB 8.7 (L) 12/04/2021   HCT 27.7 (L) 12/04/2021   MCV 108.2 (H) 12/04/2021   PLT 314 12/04/2021   Lab Results  Component Value Date   ALT 18 11/14/2021   AST 24 11/14/2021   ALKPHOS 40 11/14/2021   BILITOT 1.1 11/14/2021   No results found for: "25OHVITD2", "25OHVITD3", "VD25OH"   Review of Systems  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   Macrocytic anemia 08/15/2019   Weight loss 08/15/2019   Elevated ferritin 08/15/2019   Erectile dysfunction 07/25/2019   Class 2 severe obesity due to excess calories with serious comorbidity and body mass index (BMI) of 36.0 to 36.9 in adult [facility]) 07/19/2019   History of adenomatous polyp of colon    Annual physical exam 08/30/2014   Epigastric hernia 07/18/2014    Allergies  Allergen Reactions   Amoxicillin-Pot Clavulanate Hives and Nausea And Vomiting    Past Surgical History:  Procedure Laterality Date   CATARACT EXTRACTION W/PHACO Right 03/09/2017   Procedure: CATARACT EXTRACTION PHACO AND  INTRAOCULAR LENS PLACEMENT ([place]) RIGHT;  Surgeon: Eulogio Bear, MD;  Location: [place];  Service: Ophthalmology;  Laterality: Right;   CATARACT EXTRACTION W/PHACO Left 08/25/2021   Procedure: CATARACT EXTRACTION PHACO AND INTRAOCULAR LENS PLACEMENT (IOC) LEFT;  Surgeon: Eulogio Bear, MD;  Location: [place];  Service: Ophthalmology;  Laterality: Left;   CATARACT EXTRACTION W/PHACO Left 10/13/2021   Procedure: CATARACT EXTRACTION PHACO AND INTRAOCULAR LENS PLACEMENT ([place]) LEFT;  Surgeon: Eulogio Bear, MD;  Location: [place];  Service: Ophthalmology;  Laterality: Left;  8.12 0:52.0   COLONOSCOPY  2013   normal- cleared for 5 yrs- Dr Beckey Downing   COLONOSCOPY WITH PROPOFOL N/A 02/17/2017   Procedure: COLONOSCOPY WITH PROPOFOL;  Surgeon: Lin Landsman, MD;  Location: [place];  Service: Endoscopy;  Laterality: N/A;   COLONOSCOPY WITH PROPOFOL N/A 10/12/2019   Procedure: COLONOSCOPY WITH BIOPSY;  Surgeon: Lin Landsman, MD;  Location: [place];  Service: Endoscopy;  Laterality: N/A;   EPIGASTRIC HERNIA REPAIR N/A 07/06/2014   Procedure: HERNIA REPAIR EPIGASTRIC ADULT;  Surgeon: Molly Maduro, MD;  Location: [facility];  Service: General;  Laterality: N/A;   HERNIA REPAIR Bilateral    INSERTION OF MESH N/A 07/06/2014   Procedure: INSERTION OF MESH;  Surgeon: Molly Maduro, MD;  Location:  [facility];  Service: General;  Laterality: N/A;   IR RADIOLOGIST EVAL & MGMT  11/07/2019   IR RADIOLOGIST EVAL & MGMT  04/25/2020   IR RADIOLOGIST EVAL & MGMT  12/05/2020   IR RADIOLOGIST EVAL & MGMT  06/30/2021   POLYPECTOMY  02/17/2017   Procedure: POLYPECTOMY;  Surgeon: Lin Landsman, MD;  Location: [place];  Service: Endoscopy;;   POLYPECTOMY N/A 10/12/2019   Procedure: POLYPECTOMY;  Surgeon: Lin Landsman, MD;  Location: [place];  Service: Endoscopy;  Laterality: N/A;   SHOULDER ARTHROSCOPY Left  2008   SINUS EXPLORATION      Social History   Tobacco Use   Smoking status: Former    Packs/day: 0.25    Years: 50.00    Total pack years: 12.50    Types: Cigarettes    Quit date: 05/03/2018    Years since quitting: 3.6   Smokeless tobacco: Former   Tobacco comments:    pt quit smoking  Vaping Use   Vaping Use: Never used  Substance Use Topics   Alcohol use: Yes    Comment: Occasional -1x/month   Drug use: No     Medication list has been reviewed and updated.  Current Meds  Medication Sig   acetaminophen (TYLENOL) 325 MG tablet Take 650 mg by mouth every 6 (six) hours as needed.   Calcium Carb-Cholecalciferol 600-400 MG-UNIT TABS Take by mouth.   Epoetin Alfa-epbx (RETACRIT IJ)    ibuprofen (ADVIL) 200 MG tablet Take 200 mg by mouth every 6 (six) hours as needed.   meloxicam (MOBIC) 15 MG tablet Take 1 tablet (15 mg total) by mouth daily.   predniSONE (DELTASONE) 10 MG tablet Take 1 tablet (10 mg total) by mouth daily with breakfast.   tadalafil (CIALIS) 5 MG tablet TAKE ONE TABLET BY MOUTH DAILY   tadalafil (CIALIS) 5 MG tablet Take 1 tablet by mouth daily.       12/12/2021   11:17 AM 11/27/2021    3:23 PM 08/26/2021    2:02 PM 07/25/2021    8:24 AM  GAD 7 : Generalized Anxiety Score  Nervous, Anxious, on Edge 0 0 0 0  Control/stop worrying 0 0 0 0  Worry too much - different things 0 0 0 0  Trouble relaxing 0 0 0 0  Restless 0 0 0 0  Easily annoyed or irritable 0 0 0 0  Afraid - awful might happen 0 0 0 0  Total GAD 7 Score 0 0 0 0  Anxiety Difficulty Not difficult at all Not difficult at all Not difficult at all Not difficult at all       12/12/2021   11:17 AM 11/27/2021    3:22 PM 08/26/2021    2:01 PM  Depression screen PHQ 2/9  Decreased Interest 0 0 3  Down, Depressed, Hopeless 0 0 3  PHQ - 2 Score 0 0 6  Altered sleeping 0 0 3  Tired, decreased energy 0 0 3  Change in appetite 0 0 0  Feeling bad or failure about yourself  0 0 0  Trouble  concentrating 0 0 0  Moving slowly or fidgety/restless 0 0 0  Suicidal thoughts 0 0 0  PHQ-9 Score 0 0 12  Difficult doing work/chores Not difficult at all Not difficult at all Somewhat difficult    BP Readings from Last 3 Encounters:  12/12/21 124/78  12/04/21 (!) 141/83  11/27/21 102/68    Physical Exam  Wt Readings  from Last 3 Encounters:  12/12/21 260 lb (117.9 kg)  11/27/21 264 lb (119.7 kg)  11/14/21 259 lb 6.4 oz (117.7 kg)    BP 124/78   Pulse 68   Ht '5\' 10"'$  (1.778 m)   Wt 260 lb (117.9 kg)   SpO2 99%   BMI 37.31 kg/m   Assessment and Plan:  1. Venous ulcer of ankle, right (HCC) Chronic.  Persistent.  New onset developed an ulcer of the medial malleolus of the right ankle.  There is may be some exposed fat pad noted.  We will clean the area and redress it we will start doxycycline twice a day with Bactroban on a daily basis and refer to dermatology for ulcer treatment and vascular surgery for evaluation venous insufficiency and treatment of venous stasis dermatitis. - doxycycline (VIBRA-TABS) 100 MG tablet; Take 1 tablet (100 mg total) by mouth 2 (two) times daily.  Dispense: 20 tablet; Refill: 0 - mupirocin ointment (BACTROBAN) 2 %; Apply 1 Application topically 2 (two) times daily.  Dispense: 22 g; Refill: 0 - Ambulatory referral to Dermatology - Ambulatory referral to Vascular Surgery  2. 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  Controlled.  Stable.  As noted above probably has underlying venous insufficiency which needs to be further evaluated and treated with compression stockings and/or other endeavors. - doxycycline (VIBRA-TABS) 100 MG tablet; Take 1 tablet (100 mg total) by mouth 2 (two) times daily.  Dispense: 20 tablet; Refill: 0 - mupirocin ointment (BACTROBAN) 2 %; Apply 1 Application topically 2 (two) times daily.  Dispense: 22 g; Refill: 0 - Ambulatory referral to Dermatology - Ambulatory referral to Vascular Surgery   3.  Venous  insufficiency.  Placement to vein and vascular referral for evaluation and further treatment. Otilio Miu, MD

## 2021-12-12 NOTE — Patient Instructions (Addendum)
Stasis Dermatitis Stasis dermatitis is a long-term (chronic) skin condition that happens when veins can no longer pump blood back to the heart (poor circulation). This condition causes a red or brown scaly rash or sores (ulcers) from the pooling of blood (stasis). This condition usually affects the lower legs. It may affect one leg or both legs. Without treatment, severe stasis dermatitis can lead to other skin conditions and infections. What are the causes? This condition is caused by poor circulation. What increases the risk? You are more likely to develop this condition if: You are not very active. You stand for long periods of time. You have veins that have become enlarged and twisted (varicose veins). You have leg veins that are not strong enough to send blood back to the heart (venous insufficiency). You have had a blood clot. You have been pregnant many times. You have had vein surgery. You are obese. You have heart or kidney failure. You are 75 years of age or older. You have had injuries to your legs in the past. What are the signs or symptoms? Common early symptoms of this condition include: Itchiness in one or both of your legs. Swelling in your ankle or leg. This might get better overnight but be worse again during the day. Skin that looks thin on your ankle and leg. Red or brown marks that develop slowly. Skin that is dry, cracked, or easily irritated. Red, swollen skin that is sore or has a burning feeling. An achy or heavy feeling after you walk or stand for long periods of time. Pain. Later and more severe symptoms of this condition include: Skin that looks shiny. Small, open sores (ulcers). These are often red or purple and leak fluid. Skin that feels hard. Severe itching. A change in the shape or color of your lower legs. Severe pain. Difficulty walking. How is this diagnosed? This condition may be diagnosed based on: Your symptoms and medical history. A  physical exam. You may also have tests, including: Blood tests. Imaging tests to check blood flow (Doppler ultrasound). Allergy tests. You may need to see a health care provider who specializes in skin diseases (dermatologist). How is this treated? This condition may be treated with: Compression stockings or an elastic wrap to improve circulation. Medicines, such as: Corticosteroid creams and ointments. Non-corticosteroid medicines applied to the skin (topical). Medicine to reduce swelling in the legs (diuretics). Antibiotics. Medicine to relieve itching (antihistamines). A bandage (dressing). A wrap that contains zinc and gelatin ([place] boot). Follow these instructions at home: Skin care Moisturize your skin as told by your health care provider. Do not use moisturizers with fragrance. This can irritate your skin. Apply a cool, wet cloth (cool compress) to the affected areas. Do not scratch your skin. Do not rub your skin dry after a bath or shower. Gently pat your skin dry. Do not use scented soaps, detergents, or perfumes. Medicines Take or use over-the-counter and prescription medicines only as told by your health care provider. If you were prescribed an antibiotic medicine, take or use it as told by your health care provider. Do not stop taking or using the antibiotic even if your condition improves. Activity Walk as told by your health care provider. Walking increases blood flow. Do calf and ankle exercises throughout the day as told by your health care provider. This will help increase blood flow. Raise (elevate) your legs above the level of your heart when you are sitting or lying down. Lifestyle Work with your health   care provider to lose weight, if needed. Do not cross your legs when you sit. Do not stand or sit in one position for long periods of time. Wear comfortable, loose-fitting clothing. Circulation in your legs will be worse if you wear tight pants, belts, and  waistbands. Do not use any products that contain nicotine or tobacco, such as cigarettes, e-cigarettes, and chewing tobacco. If you need help quitting, ask your health care provider. General instructions If you were asked to use one of the following to help with your condition, follow instructions from your health care provider on how to: Remove and change any dressing. Wear compression stockings. These stockings help to prevent blood clots and reduce swelling in your legs. Wear the [employer]. Keep all follow-up visits as told by your health care provider. This is important. Contact a health care provider if: Your condition does not improve with treatment. Your condition gets worse. You have signs of infection in the affected area. Watch for: Swelling. Tenderness. Redness. Soreness. Warmth. You have a fever. Get help right away if: You notice red streaks coming from the affected area. Your bone or joint underneath the affected area becomes painful after the skin has healed. The affected area turns darker. You feel a deep pain in your leg or groin. You are short of breath. Summary Stasis dermatitis is a long-term (chronic) skin condition that happens when veins can no longer pump blood back to the heart (poor circulation). Wear compression stockings as told by your health care provider. These stockings help to prevent blood clots and reduce swelling in your legs. Follow instructions from your health care provider about activity, medicines, and lifestyle. Contact a health care provider if you have a fever or have signs of infection in the affected area. Keep all follow-up visits as told by your health care provider. This is important. This information is not intended to replace advice given to you by your health care provider. Make sure you discuss any questions you have with your health care provider. Document Revised: 03/25/2020 Document Reviewed: 03/25/2020 Elsevier Patient Education   [place]. Venous Ulcer A venous ulcer is a shallow sore on your lower leg that is caused by poor circulation in your veins. This condition used to be called stasis ulcer. Venous ulcer is the most common type of lower leg ulcer. You may have venous ulcers on one leg or on both legs. The area where this condition most commonly develops is around the ankles. A venous ulcer may last for a long time (chronic ulcer) or it may return repeatedly (recurrent ulcer). What are the causes? A venous ulcer may be caused by any condition that causes poor blood flow in your legs. Veins have valves that help return blood to the heart. If these valves do not work properly: Blood can flow backward and pool in the lower legs. Blood can then leak out of your veins, which can irritate your skin. Irritation can cause a break in the skin, which becomes a venous ulcer. What increases the risk? You are more likely to develop this condition if you: Are 25 years of age or older. Are male. Are overweight. Are not active. Have had a leg ulcer in the past. Have varicose veins. Have clots in your lower leg veins (deep vein thrombosis). Have inflammation of your leg veins (phlebitis). Have recently had a pregnancy. Use products that contain nicotine or tobacco. What are the signs or symptoms? The main symptom of this condition is  an open sore near your ankle. Other symptoms may include: Swelling. Thickening of the skin. Fluid leaking from the ulcer. Bleeding. Itching. Pain and swelling that gets worse when you stand up and feels better when you raise your leg. Blotchy skin. Darkening of the skin. How is this diagnosed? Your health care provider may suspect a venous ulcer based on your medical history and your risk factors. He or she may: Do a physical exam. Do other tests, such as: Measuring blood pressure in your arms and legs. Using sound waves (ultrasound) to measure blood flow in your leg  veins. How is this treated? This condition may be treated by: Keeping your leg raised (elevated). Wearing a type of bandage or stocking to compress the veins of your leg (compression therapy). Taking medicines to improve blood flow. Taking antibiotic medicines to treat infection. Cleaning your ulcer and removing any dead tissue from the wound (debridement). Placing various types of medicated bandages (dressings) or medicated wraps on your ulcer. Surgery to close the wound using a piece of skin taken from another area of your body (graft). This is only done for wounds that are deep or hard to heal. You may need to try several different types of treatment to get your venous ulcer to heal. Healing may take a long time. Follow these instructions at home: Medicines Take or apply over-the-counter and prescription medicines only as told by your health care provider. If you were prescribed an antibiotic medicine, take it as told by your health care provider. Do not stop using the antibiotic even if you start to feel better. Ask your health care provider if you should take aspirin before long trips. Wound care Follow instructions from your health care provider about how to take care of your wound. Make sure you: Wash your hands with soap and water before and after you change your bandage (dressing). If soap and water are not available, use hand sanitizer. Change your dressing as told by your health care provider. If you had a skin graft, leave stitches (sutures) in place. These may need to stay in place for 2 weeks or longer. Ask when you should remove your dressing. If your dressing is dry and sticks to your leg when you try to remove it, moisten or wet the dressing with saline solution or water so that the dressing can be removed without harming your skin or wound tissue. When you are able to remove your dressing, check your wound every day for signs of infection. Have a caregiver do this for you if  you are not able to do it yourself. Check for: More redness, swelling, or pain. More fluid or blood. Warmth. Pus or a bad smell. Activity Avoid sitting for a long time without moving. Get up to take short walks every 1-2 hours. This is important to improve blood flow in your legs. Ask for help if you feel weak or unsteady. Ask your health care provider what level of activity is safe for you. Rest with your legs raised (elevated) during the day. If possible, elevate your legs above the level of your heart for 30 minutes, 3-4 times a day, or as told by your health care provider. Do not sit with your legs crossed. General instructions  Wear elastic stockings, compression stockings, or support hose as told by your health care provider. Raise the foot of your bed as told by your health care provider. Do not use any products that contain nicotine or tobacco, such as cigarettes, e-cigarettes,  and chewing tobacco. If you need help quitting, ask your health care provider. Keep all follow-up visits as told by your health care provider. This is important. Contact a health care provider if: Your ulcer is getting larger or is not healing. Your pain gets worse. Get help right away if you have: More redness, swelling, or pain around your ulcer. More fluid or blood coming from your ulcer. Warmth in the area around your ulcer. Pus or a bad smell coming from your ulcer. A fever. Summary A venous ulcer is a shallow sore on your lower leg that is caused by poor circulation in your veins. Follow instructions from your health care provider about how to take care of your wound. Check your wound every day for signs of infection. Take over-the-counter and prescription medicines only as told by your health care provider. Keep all follow-up visits as told by your health care provider. This is important. This information is not intended to replace advice given to you by your health care provider. Make sure you  discuss any questions you have with your health care provider. Document Revised: 11/07/2020 Document Reviewed: 11/07/2020 Elsevier Patient Education  [place].

## 2021-12-12 NOTE — Progress Notes (Signed)
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 Bone marrow biopsy on 09/11/2019 revealed macrocytic anemia and hypercellular bone marrow for age with dyspoietic changes primarily involving the erythroid cell lines associated with abundant ring sideroblasts (> 15%).  There was no increase in blasts. Flow cytometry was negative. Cytogenetics revealed 45,X,-Y [20].  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 IPSS score is low.   Baseline EPO level was 63.3 in August 2021.  Patient received Aranesp for a month in September 2021 and was then switched to [place].  He was again switched to iron As per insurance preference 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   MRI abdomen in September 2021 showed 1.3 cm lesion in the left kidney concerning for renal cell carcinoma for which he follows up with urology    Interval history-patient is doing well.  He remains independent of his ADLs and IADLs.  Energy levels have remained stable.  Reports no side effects from Aranesp.  ECOG PS- 1 Pain scale- 0   Review of systems- Review of Systems  Constitutional:  Positive for malaise/fatigue. Negative for chills, fever and weight loss.  HENT:  Negative for congestion, ear discharge and nosebleeds.   Eyes:   Negative for blurred vision.  Respiratory:  Negative for cough, hemoptysis, sputum production, shortness of breath and wheezing.   Cardiovascular:  Negative for chest pain, palpitations, orthopnea and claudication.  Gastrointestinal:  Negative for abdominal pain, blood in stool, constipation, diarrhea, heartburn, melena, nausea and vomiting.  Genitourinary:  Negative for dysuria, flank pain, frequency, hematuria and urgency.  Musculoskeletal:  Negative for back pain, joint pain and myalgias.  Skin:  Negative for rash.  Neurological:  Negative for dizziness, tingling, focal weakness, seizures, weakness and headaches.  Endo/Heme/Allergies:  Does not bruise/bleed easily.  Psychiatric/Behavioral:  Negative for depression and suicidal ideas. The patient does not have insomnia.       Allergies  Allergen Reactions   Amoxicillin-Pot Clavulanate Hives and Nausea And Vomiting     Past Medical History:  Diagnosis Date   Anemia    Heart murmur    Hydrocele    MDS (myelodysplastic syndrome) (HCC)    Rotator cuff tear arthropathy of right shoulder 08/01/2020   Venous stasis dermatitis of both lower extremities      Past Surgical History:  Procedure Laterality Date   CATARACT EXTRACTION W/PHACO Right 03/09/2017   Procedure: CATARACT EXTRACTION PHACO AND INTRAOCULAR LENS PLACEMENT ([place]) RIGHT;  Surgeon: Eulogio Bear, MD;  Location: [place];  Service: Ophthalmology;  Laterality: Right;   CATARACT EXTRACTION W/PHACO Left 08/25/2021   Procedure: CATARACT EXTRACTION PHACO AND INTRAOCULAR LENS PLACEMENT ([facility]) LEFT;  Surgeon: Eulogio Bear, MD;  Location: [place];  Service: Ophthalmology;  Laterality: Left;   CATARACT EXTRACTION W/PHACO Left 10/13/2021   Procedure: CATARACT EXTRACTION PHACO AND INTRAOCULAR  LENS PLACEMENT ([facility]) LEFT;  Surgeon: Eulogio Bear, MD;  Location: [place];  Service: Ophthalmology;  Laterality: Left;  8.12 0:52.0   COLONOSCOPY   2013   normal- cleared for 5 yrs- Dr Beckey Downing   COLONOSCOPY WITH PROPOFOL N/A 02/17/2017   Procedure: COLONOSCOPY WITH PROPOFOL;  Surgeon: Lin Landsman, MD;  Location: [place];  Service: Endoscopy;  Laterality: N/A;   COLONOSCOPY WITH PROPOFOL N/A 10/12/2019   Procedure: COLONOSCOPY WITH BIOPSY;  Surgeon: Lin Landsman, MD;  Location: [place];  Service: Endoscopy;  Laterality: N/A;   EPIGASTRIC HERNIA REPAIR N/A 07/06/2014   Procedure: HERNIA REPAIR EPIGASTRIC ADULT;  Surgeon: Molly Maduro, MD;  Location: [facility];  Service: General;  Laterality: N/A;   HERNIA REPAIR Bilateral    INSERTION OF MESH N/A 07/06/2014   Procedure: INSERTION OF MESH;  Surgeon: Molly Maduro, MD;  Location: [facility];  Service: General;  Laterality: N/A;   IR RADIOLOGIST EVAL & MGMT  11/07/2019   IR RADIOLOGIST EVAL & MGMT  04/25/2020   IR RADIOLOGIST EVAL & MGMT  12/05/2020   IR RADIOLOGIST EVAL & MGMT  06/30/2021   POLYPECTOMY  02/17/2017   Procedure: POLYPECTOMY;  Surgeon: Lin Landsman, MD;  Location: [place];  Service: Endoscopy;;   POLYPECTOMY N/A 10/12/2019   Procedure: POLYPECTOMY;  Surgeon: Lin Landsman, MD;  Location: [place];  Service: Endoscopy;  Laterality: N/A;   SHOULDER ARTHROSCOPY Left 2008   SINUS EXPLORATION      Social History   Socioeconomic History   Marital status: Married    Spouse name: Not on file   Number of children: 3   Years of education: Not on file   Highest education level: Bachelor's degree (e.g., BA, AB, BS)  Occupational History   Occupation: Retired  Tobacco Use   Smoking status: Former    Packs/day: 0.25    Years: 50.00    Total pack years: 12.50    Types: Cigarettes    Quit date: 05/03/2018    Years since quitting: 3.6   Smokeless tobacco: Former   Tobacco comments:    pt quit smoking  Vaping Use   Vaping Use: Never used  Substance and Sexual Activity   Alcohol use: Yes    Comment:  Occasional -1x/month   Drug use: No   Sexual activity: Yes  Other Topics Concern   Not on file  Social History Narrative   Not on file   Social Determinants of Health   Financial Resource Strain: Low Risk  (02/26/2021)   Overall Financial Resource Strain (CARDIA)    Difficulty of Paying Living Expenses: Not hard at all  Food Insecurity: No Food Insecurity (02/26/2021)   Hunger Vital Sign    Worried About Running Out of Food in the Last Year: Never true    Hope in the Last Year: Never true  Transportation Needs: No Transportation Needs (02/26/2021)   PRAPARE - Hydrologist (Medical): No    Lack of Transportation (Non-Medical): No  Physical Activity: Insufficiently Active (02/26/2021)   Exercise Vital Sign    Days of Exercise per Week: 3 days    Minutes of Exercise per Session: 30 min  Stress: No Stress Concern Present (02/26/2021)   [place]    Feeling of Stress : Only a little  Social Connections: Moderately Integrated (02/26/2021)   Social Connection and Isolation Panel [NHANES]  Frequency of Communication with Friends and Family: More than three times a week    Frequency of Social Gatherings with Friends and Family: Once a week    Attends Religious Services: More than 4 times per year    Active Member of [employer] or Organizations: No    Attends Archivist Meetings: Never    Marital Status: Married  Human resources officer Violence: Not At Risk (02/26/2021)   Humiliation, Afraid, Rape, and Kick questionnaire    Fear of Current or Ex-Partner: No    Emotionally Abused: No    Physically Abused: No    Sexually Abused: No    Family History  Problem Relation Age of Onset   Diabetes Father    Heart disease Father 22   Stroke Mother      Current Outpatient Medications:    acetaminophen (TYLENOL) 325 MG tablet, Take 650 mg by mouth every 6 (six) hours as needed., Disp: , Rfl:     Calcium Carb-Cholecalciferol 600-400 MG-UNIT TABS, Take by mouth., Disp: , Rfl:    doxycycline (VIBRA-TABS) 100 MG tablet, Take 1 tablet (100 mg total) by mouth 2 (two) times daily., Disp: 20 tablet, Rfl: 0   Epoetin Alfa-epbx (RETACRIT IJ), , Disp: , Rfl:    ibuprofen (ADVIL) 200 MG tablet, Take 200 mg by mouth every 6 (six) hours as needed., Disp: , Rfl:    meloxicam (MOBIC) 15 MG tablet, Take 1 tablet (15 mg total) by mouth daily., Disp: 30 tablet, Rfl: 0   mupirocin ointment (BACTROBAN) 2 %, Apply 1 Application topically 2 (two) times daily., Disp: 22 g, Rfl: 0   predniSONE (DELTASONE) 10 MG tablet, Take 1 tablet (10 mg total) by mouth daily with breakfast., Disp: 30 tablet, Rfl: 0   tadalafil (CIALIS) 5 MG tablet, TAKE ONE TABLET BY MOUTH DAILY, Disp: 30 tablet, Rfl: 11 No current facility-administered medications for this visit.  Facility-Administered Medications Ordered in Other Visits:    Darbepoetin Alfa (ARANESP) injection 300 mcg, 300 mcg, Subcutaneous, Weekly, Sindy Guadeloupe, MD, 300 mcg at 12/12/21 1416   epoetin alfa-epbx (RETACRIT) injection 40,000 Units, 40,000 Units, Subcutaneous, Weekly, Sindy Guadeloupe, MD, 40,000 Units at 04/02/21 1523  Physical exam:  Vitals:   12/12/21 1335  BP: 139/70  Pulse: 62  Temp: (!) 96.7 F (35.9 C)  TempSrc: Tympanic  SpO2: 99%  Weight: 261 lb 11.2 oz (118.7 kg)   Physical Exam Cardiovascular:     Rate and Rhythm: Normal rate and regular rhythm.     Heart sounds: Normal heart sounds.  Pulmonary:     Effort: Pulmonary effort is normal.  Skin:    General: Skin is warm and dry.  Neurological:     Mental Status: He is alert and oriented to person, place, and time.         Latest Ref Rng & Units 11/14/2021    1:50 PM  CMP  Glucose 70 - 99 mg/dL 148   BUN 8 - 23 mg/dL 21   Creatinine 0.61 - 1.24 mg/dL 1.04   Sodium 135 - 145 mmol/L 135   Potassium 3.5 - 5.1 mmol/L 4.3   Chloride 98 - 111 mmol/L 103   CO2 22 - 32 mmol/L 26    Calcium 8.9 - 10.3 mg/dL 8.8   Total Protein 6.5 - 8.1 g/dL 6.6   Total Bilirubin 0.3 - 1.2 mg/dL 1.1   Alkaline Phos 38 - 126 U/L 40   AST 15 - 41 U/L 24   ALT  0 - 44 U/L 18       Latest Ref Rng & Units 12/12/2021    1:25 PM  CBC  Hemoglobin 13.0 - 17.0 g/dL 9.8   Hematocrit 39.0 - 52.0 % 31.3     No images are attached to the encounter.  DG Lumbar Spine Complete  Result Date: 11/30/2021 CLINICAL DATA:  Left buttock pain, left-sided sciatica EXAM: LUMBAR SPINE - COMPLETE 4+ VIEW COMPARISON:  None Available. FINDINGS: Fracture or dislocation of the lumbar spine. Mild to moderate multilevel lumbar disc space height loss and osteophytosis, worst at L2-L3 and L5-S1. Moderate multilevel facet degenerative change, worst at the lower lumbar levels. Nonobstructive pattern of overlying bowel gas. Aortic atherosclerosis. IMPRESSION: 1. No fracture or dislocation of the lumbar spine. 2. Mild to moderate multilevel lumbar disc and facet degenerative change, worst at L2-L3 and L5-S1. Lumbar disc and neural foraminal pathology may be further evaluated by MRI if indicated by neurologically localizing signs and symptoms. Electronically Signed   By: Delanna Ahmadi M.D.   On: 11/30/2021 21:09     Assessment and plan- Patient is a 76 y.o. male with low risk MDS with SF 3 B1 mutation here for routine follow-up  Patient has been on EPO for his MDS related anemia since November 2021 with a hemoglobin which has remained stable between 9-10.  Transiently he did drop his hemoglobin to 8.5 but it is now back up to 9.8 again today.  He is currently getting Aranesp 300 mcg weekly which he will continue.  If his hemoglobin remains consistently around 8 despite getting Aranesp we will consider switching him to Luspatercept at that time.  H&H and Aranesp once a weekAnd I will see him back in 3 months with CBC with differential CMP ferritin and iron studies   Visit Diagnosis 1. Erythropoietin (EPO) stimulating agent  anemia management patient   2. Refractory anemia with ring sideroblasts (HCC)      Dr. Randa Evens, MD, MPH [facility] [phone] 12/12/2021 2:51 PM

## 2021-12-12 NOTE — Progress Notes (Signed)
Patient is here for follow-up. Patient was seen two weeks ago by PCP and started on mobic and prednisone for pain in his sciatic. Patient states that this is getting better. He was seen by PCP again today for an ulcer on his right ankle. They are starting him on doxycycline and Bactroban for this.

## 2021-12-12 NOTE — Telephone Encounter (Signed)
Copied from [place] [id number]. Topic: General - Inquiry >> Dec 12, 2021 12:15 PM Penni Bombard wrote: Reason for CRM:  Pt called to tell tara that his appt is Monday with Dr. Phillip Heal?

## 2021-12-15 DIAGNOSIS — I872 Venous insufficiency (chronic) (peripheral): Secondary | ICD-10-CM | POA: Diagnosis not present

## 2021-12-15 DIAGNOSIS — L0390 Cellulitis, unspecified: Secondary | ICD-10-CM | POA: Diagnosis not present

## 2021-12-15 DIAGNOSIS — I8311 Varicose veins of right lower extremity with inflammation: Secondary | ICD-10-CM | POA: Diagnosis not present

## 2021-12-17 ENCOUNTER — Inpatient Hospital Stay: Payer: Medicare Other

## 2021-12-17 ENCOUNTER — Ambulatory Visit: Payer: Medicare Other

## 2021-12-17 ENCOUNTER — Other Ambulatory Visit: Payer: Medicare Other

## 2021-12-17 VITALS — BP 122/74 | HR 68

## 2021-12-17 DIAGNOSIS — D461 Refractory anemia with ring sideroblasts: Secondary | ICD-10-CM

## 2021-12-17 LAB — HEMOGLOBIN AND HEMATOCRIT, BLOOD
HCT: 31.3 % — ABNORMAL LOW (ref 39.0–52.0)
Hemoglobin: 9.8 g/dL — ABNORMAL LOW (ref 13.0–17.0)

## 2021-12-17 MED ORDER — DARBEPOETIN ALFA 300 MCG/0.6ML IJ SOSY
300.0000 ug | PREFILLED_SYRINGE | INTRAMUSCULAR | Status: DC
  Administered 2021-12-17: 300 ug via SUBCUTANEOUS
  Filled 2021-12-17: qty 0.6

## 2021-12-19 ENCOUNTER — Other Ambulatory Visit: Payer: Self-pay | Admitting: Family Medicine

## 2021-12-19 DIAGNOSIS — M5432 Sciatica, left side: Secondary | ICD-10-CM

## 2021-12-19 NOTE — Telephone Encounter (Signed)
Requested medication (s) are due for refill today: Yes  Requested medication (s) are on the active medication list: Yes  Last refill:  11/27/21  Future visit scheduled: No  Notes to clinic:  Unable to refill per protocol, cannot delegate. Unsure if meloxicam should be refilled since prescribed for acute issue, route to provider for approval     Requested Prescriptions  Pending Prescriptions Disp Refills   predniSONE (DELTASONE) 10 MG tablet [Pharmacy Med Name: PREDNISONE 10 MG TABLET] 30 tablet 0    Sig: Take 1 tablet (10 mg total) by mouth daily with breakfast.     Not Delegated - Endocrinology:  Oral Corticosteroids Failed - 12/19/2021  2:05 AM      Failed - This refill cannot be delegated      Failed - Manual Review: Eye exam for IOP if prolonged treatment      Failed - Glucose (serum) in normal range and within 180 days    Glucose, Bld  Date Value Ref Range Status  11/14/2021 148 (H) 70 - 99 mg/dL Final    Comment:    Glucose reference range applies only to samples taken after fasting for at least 8 hours.         Failed - Bone Mineral Density or Dexa Scan completed in the last 2 years      13 - K in normal range and within 180 days    Potassium  Date Value Ref Range Status  11/14/2021 4.3 3.5 - 5.1 mmol/L Final         Passed - Na in normal range and within 180 days    Sodium  Date Value Ref Range Status  11/14/2021 135 135 - 145 mmol/L Final  07/19/2019 141 134 - 144 mmol/L Final         Passed - Last BP in normal range    BP Readings from Last 1 Encounters:  12/17/21 122/74         Passed - Valid encounter within last 6 months    Recent Outpatient Visits           1 week ago Venous ulcer of ankle, right ([place])   [place] [facility] [place], [name] C, MD   3 weeks ago Sciatica of left side   [place] [facility] [place], [name] C, MD   3 months ago New onset a-fib  [facility])   [place] [facility] at [place], [name] C, MD   4 months ago Annual physical exam   [facility] at [place], [name] C, MD   1 year ago Annual physical exam   [facility] at [place], [place], MD       Future Appointments             In 7 months [name], MD [facility] at [facility], [facility]             meloxicam (MOBIC) 15 MG tablet [Pharmacy Med Name: MELOXICAM 15 MG TABLET] 30 tablet 0    Sig: Take 1 tablet (15 mg total) by mouth daily.     Analgesics:  COX2 Inhibitors Failed - 12/19/2021  2:05 AM      Failed - Manual Review: Labs are only required if the patient has taken medication for more than  8 weeks.      Failed - HGB in normal range and within 360 days    Hemoglobin  Date Value Ref Range Status  12/17/2021 9.8 (L) 13.0 - 17.0 g/dL Final  07/26/2019 9.8 (L) 13.0 - 17.7 g/dL Final         Failed - HCT in normal range and within 360 days    HCT  Date Value Ref Range Status  12/17/2021 31.3 (L) 39.0 - 52.0 % Final    Comment:    Performed at [facility], [address] [place], [place] [address]   Hematocrit  Date Value Ref Range Status  07/26/2019 28.5 (L) 37.5 - 51.0 % Final         Passed - Cr in normal range and within 360 days    Creatinine, Ser  Date Value Ref Range Status  11/14/2021 1.04 0.61 - 1.24 mg/dL Final         Passed - AST in normal range and within 360 days    AST  Date Value Ref Range Status  11/14/2021 24 15 - 41 U/L Final         Passed - ALT in normal range and within 360 days    ALT  Date Value Ref Range Status  11/14/2021 18 0 - 44 U/L Final         Passed - eGFR is 30 or above and within 360 days    GFR calc Af Amer  Date Value Ref Range Status  07/19/2019 84 >59 mL/min/1.73 Final    Comment:    **[facility] currently reports eGFR  in compliance with the current**   recommendations of the [employer]. [facility] will   update reporting as new guidelines are published from the NKF-ASN   Task force.    GFR, Estimated  Date Value Ref Range Status  11/14/2021 >60 >60 mL/min Final    Comment:    (NOTE) Calculated using the CKD-EPI Creatinine Equation (2021)          Passed - Patient is not pregnant      Passed - Valid encounter within last 12 months    Recent Outpatient Visits           1 week ago Venous ulcer of ankle, right ([place])   [place] [facility] at [place], [name] C, MD   3 weeks ago Sciatica of left side   [place] [facility] at [place], [place], MD   3 months ago New onset a-fib [facility])   [place] [facility] at [place], [name] C, MD   4 months ago Annual physical exam   [facility] at [place], [name] C, MD   1 year ago Annual physical exam   [facility] at [place], [place], MD       Future Appointments             In 7 months [name], MD [place] [facility] at [facility], [facility]

## 2021-12-22 DIAGNOSIS — I872 Venous insufficiency (chronic) (peripheral): Secondary | ICD-10-CM | POA: Diagnosis not present

## 2021-12-26 ENCOUNTER — Ambulatory Visit: Payer: Medicare Other

## 2021-12-26 ENCOUNTER — Inpatient Hospital Stay: Payer: Medicare Other | Attending: Nurse Practitioner

## 2021-12-26 ENCOUNTER — Inpatient Hospital Stay: Payer: Medicare Other

## 2021-12-26 ENCOUNTER — Other Ambulatory Visit: Payer: Medicare Other

## 2021-12-26 ENCOUNTER — Ambulatory Visit: Payer: Medicare Other | Admitting: Oncology

## 2021-12-26 ENCOUNTER — Telehealth: Payer: Self-pay | Admitting: Family Medicine

## 2021-12-26 VITALS — BP 143/75 | HR 70

## 2021-12-26 DIAGNOSIS — D461 Refractory anemia with ring sideroblasts: Secondary | ICD-10-CM

## 2021-12-26 LAB — HEMOGLOBIN AND HEMATOCRIT, BLOOD
HCT: 29.3 % — ABNORMAL LOW (ref 39.0–52.0)
Hemoglobin: 9.3 g/dL — ABNORMAL LOW (ref 13.0–17.0)

## 2021-12-26 MED ORDER — DARBEPOETIN ALFA 300 MCG/0.6ML IJ SOSY
300.0000 ug | PREFILLED_SYRINGE | INTRAMUSCULAR | Status: AC
  Administered 2021-12-26: 300 ug via SUBCUTANEOUS
  Filled 2021-12-26: qty 0.6

## 2021-12-26 NOTE — Telephone Encounter (Signed)
Pt wanted to ask if he is suppose to continue taking the meloxicam (MOBIC) 15 MG tablet [[id number]]  And if so is it on a regular basis or as needed / please advise

## 2021-12-29 DIAGNOSIS — M5432 Sciatica, left side: Secondary | ICD-10-CM | POA: Diagnosis not present

## 2021-12-29 DIAGNOSIS — R262 Difficulty in walking, not elsewhere classified: Secondary | ICD-10-CM | POA: Diagnosis not present

## 2021-12-31 DIAGNOSIS — I8311 Varicose veins of right lower extremity with inflammation: Secondary | ICD-10-CM | POA: Diagnosis not present

## 2022-01-01 DIAGNOSIS — R262 Difficulty in walking, not elsewhere classified: Secondary | ICD-10-CM | POA: Diagnosis not present

## 2022-01-01 DIAGNOSIS — M5432 Sciatica, left side: Secondary | ICD-10-CM | POA: Diagnosis not present

## 2022-01-02 ENCOUNTER — Other Ambulatory Visit: Payer: Medicare Other

## 2022-01-02 ENCOUNTER — Inpatient Hospital Stay: Payer: Medicare Other

## 2022-01-02 ENCOUNTER — Ambulatory Visit: Payer: Medicare Other

## 2022-01-02 VITALS — BP 140/88 | HR 72

## 2022-01-02 DIAGNOSIS — D461 Refractory anemia with ring sideroblasts: Secondary | ICD-10-CM | POA: Diagnosis not present

## 2022-01-02 LAB — HEMOGLOBIN AND HEMATOCRIT, BLOOD
HCT: 29.7 % — ABNORMAL LOW (ref 39.0–52.0)
Hemoglobin: 9.6 g/dL — ABNORMAL LOW (ref 13.0–17.0)

## 2022-01-02 MED ORDER — DARBEPOETIN ALFA 300 MCG/0.6ML IJ SOSY
300.0000 ug | PREFILLED_SYRINGE | INTRAMUSCULAR | Status: DC
  Administered 2022-01-02: 300 ug via SUBCUTANEOUS
  Filled 2022-01-02: qty 0.6

## 2022-01-06 DIAGNOSIS — M5432 Sciatica, left side: Secondary | ICD-10-CM | POA: Diagnosis not present

## 2022-01-06 DIAGNOSIS — R262 Difficulty in walking, not elsewhere classified: Secondary | ICD-10-CM | POA: Diagnosis not present

## 2022-01-08 DIAGNOSIS — M5432 Sciatica, left side: Secondary | ICD-10-CM | POA: Diagnosis not present

## 2022-01-08 DIAGNOSIS — R262 Difficulty in walking, not elsewhere classified: Secondary | ICD-10-CM | POA: Diagnosis not present

## 2022-01-09 ENCOUNTER — Inpatient Hospital Stay: Payer: Medicare Other

## 2022-01-09 ENCOUNTER — Other Ambulatory Visit: Payer: Medicare Other

## 2022-01-09 ENCOUNTER — Ambulatory Visit: Payer: Medicare Other

## 2022-01-09 VITALS — BP 138/88

## 2022-01-09 DIAGNOSIS — D461 Refractory anemia with ring sideroblasts: Secondary | ICD-10-CM

## 2022-01-09 LAB — HEMOGLOBIN AND HEMATOCRIT, BLOOD
HCT: 27.7 % — ABNORMAL LOW (ref 39.0–52.0)
Hemoglobin: 9 g/dL — ABNORMAL LOW (ref 13.0–17.0)

## 2022-01-09 MED ORDER — DARBEPOETIN ALFA 300 MCG/0.6ML IJ SOSY
300.0000 ug | PREFILLED_SYRINGE | INTRAMUSCULAR | Status: DC
  Administered 2022-01-09: 300 ug via SUBCUTANEOUS
  Filled 2022-01-09: qty 0.6

## 2022-01-14 DIAGNOSIS — R262 Difficulty in walking, not elsewhere classified: Secondary | ICD-10-CM | POA: Diagnosis not present

## 2022-01-14 DIAGNOSIS — M5432 Sciatica, left side: Secondary | ICD-10-CM | POA: Diagnosis not present

## 2022-01-14 DIAGNOSIS — I8311 Varicose veins of right lower extremity with inflammation: Secondary | ICD-10-CM | POA: Diagnosis not present

## 2022-01-16 ENCOUNTER — Inpatient Hospital Stay: Payer: Medicare Other

## 2022-01-16 VITALS — BP 114/65

## 2022-01-16 DIAGNOSIS — D461 Refractory anemia with ring sideroblasts: Secondary | ICD-10-CM

## 2022-01-16 DIAGNOSIS — R262 Difficulty in walking, not elsewhere classified: Secondary | ICD-10-CM | POA: Diagnosis not present

## 2022-01-16 DIAGNOSIS — M5432 Sciatica, left side: Secondary | ICD-10-CM | POA: Diagnosis not present

## 2022-01-16 LAB — HEMOGLOBIN AND HEMATOCRIT, BLOOD
HCT: 29.1 % — ABNORMAL LOW (ref 39.0–52.0)
Hemoglobin: 9.4 g/dL — ABNORMAL LOW (ref 13.0–17.0)

## 2022-01-16 MED ORDER — DARBEPOETIN ALFA 300 MCG/0.6ML IJ SOSY
300.0000 ug | PREFILLED_SYRINGE | INTRAMUSCULAR | Status: DC
  Administered 2022-01-16: 300 ug via SUBCUTANEOUS
  Filled 2022-01-16: qty 0.6

## 2022-01-17 ENCOUNTER — Other Ambulatory Visit: Payer: Self-pay | Admitting: Family Medicine

## 2022-01-17 DIAGNOSIS — M5432 Sciatica, left side: Secondary | ICD-10-CM

## 2022-01-20 DIAGNOSIS — M5432 Sciatica, left side: Secondary | ICD-10-CM | POA: Diagnosis not present

## 2022-01-20 DIAGNOSIS — R262 Difficulty in walking, not elsewhere classified: Secondary | ICD-10-CM | POA: Diagnosis not present

## 2022-01-22 DIAGNOSIS — M5432 Sciatica, left side: Secondary | ICD-10-CM | POA: Diagnosis not present

## 2022-01-22 DIAGNOSIS — R262 Difficulty in walking, not elsewhere classified: Secondary | ICD-10-CM | POA: Diagnosis not present

## 2022-01-23 ENCOUNTER — Ambulatory Visit (INDEPENDENT_AMBULATORY_CARE_PROVIDER_SITE_OTHER): Payer: Medicare Other | Admitting: Nurse Practitioner

## 2022-01-23 ENCOUNTER — Encounter (INDEPENDENT_AMBULATORY_CARE_PROVIDER_SITE_OTHER): Payer: Self-pay | Admitting: Nurse Practitioner

## 2022-01-23 ENCOUNTER — Inpatient Hospital Stay: Payer: Medicare Other

## 2022-01-23 ENCOUNTER — Telehealth: Payer: Self-pay | Admitting: *Deleted

## 2022-01-23 VITALS — BP 134/75 | HR 64 | Resp 16 | Wt 261.4 lb

## 2022-01-23 VITALS — BP 119/62 | HR 66

## 2022-01-23 DIAGNOSIS — D461 Refractory anemia with ring sideroblasts: Secondary | ICD-10-CM

## 2022-01-23 DIAGNOSIS — I872 Venous insufficiency (chronic) (peripheral): Secondary | ICD-10-CM | POA: Diagnosis not present

## 2022-01-23 DIAGNOSIS — I83009 Varicose veins of unspecified lower extremity with ulcer of unspecified site: Secondary | ICD-10-CM

## 2022-01-23 DIAGNOSIS — L97909 Non-pressure chronic ulcer of unspecified part of unspecified lower leg with unspecified severity: Secondary | ICD-10-CM | POA: Diagnosis not present

## 2022-01-23 LAB — HEMOGLOBIN AND HEMATOCRIT, BLOOD
HCT: 29.3 % — ABNORMAL LOW (ref 39.0–52.0)
Hemoglobin: 9.5 g/dL — ABNORMAL LOW (ref 13.0–17.0)

## 2022-01-23 MED ORDER — DARBEPOETIN ALFA 300 MCG/0.6ML IJ SOSY
300.0000 ug | PREFILLED_SYRINGE | INTRAMUSCULAR | Status: DC
  Administered 2022-01-23: 300 ug via SUBCUTANEOUS
  Filled 2022-01-23: qty 0.6

## 2022-01-23 NOTE — Patient Outreach (Signed)
  Care Coordination   01/23/2022 Name: Brett Boone MRN: [id number] DOB: 06-May-1945   Care Coordination Outreach Attempts:  An unsuccessful telephone outreach was attempted today to offer the patient information about available care coordination services as a benefit of their health plan.   Follow Up Plan:  Additional outreach attempts will be made to offer the patient care coordination information and services.   Encounter Outcome:  No Answer   Care Coordination Interventions:  No, not indicated    [name], RN, MSN, [facility] Care Management Care Management Coordinator [phone]

## 2022-01-25 ENCOUNTER — Encounter (INDEPENDENT_AMBULATORY_CARE_PROVIDER_SITE_OTHER): Payer: Self-pay | Admitting: Nurse Practitioner

## 2022-01-25 NOTE — Progress Notes (Signed)
Subjective:    Patient ID: Brett Boone, male    DOB: April 21, 1945, 76 y.o.   MRN: [id number] Chief Complaint  Patient presents with   New Patient (Initial Visit)    Ref Ronnald Ramp consult right venous ulcer and venous stasis dermatitis     Brett Boone is a 76 year old male who presents today for evaluation by Dr. Ronnald Ramp in regards to venous ulcer as well as stasis dermatitis.  The patient recently saw dermatology who wrapped his left lower extremity and Unna boots for several weeks.  Prior to this the wound developed as a's very small area but it was plagued with weeping and drainage.  Today the area is much improved and very small.  The patient has significant stasis dermatitis bilaterally and notes that this has been present for years.  He has also been having some worsening swelling in his legs.  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  Currently he denies any claudication-like symptoms or rest pain.  There are no additional open wounds or ulcerations.    Review of Systems  Cardiovascular:  Positive for leg swelling.  Skin:  Positive for wound.  All other systems reviewed and are negative.      Objective:   Physical Exam Vitals reviewed.  HENT:     Head: Normocephalic.  Cardiovascular:     Rate and Rhythm: Normal rate.     Pulses: Normal pulses.  Pulmonary:     Effort: Pulmonary effort is normal.  Musculoskeletal:     Right lower leg: Edema present.     Left lower leg: Edema present.  Skin:    General: Skin is warm and dry.     Comments: Bilateral stasis dermatitis  Neurological:     Mental Status: He is alert and oriented to person, place, and time.  Psychiatric:        Mood and Affect: Mood normal.        Behavior: Behavior normal.        Thought Content: Thought content normal.        Judgment: Judgment normal.     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/01/2020   Venous stasis dermatitis of both lower extremities     Social History   Socioeconomic History   Marital status: Married    Spouse name: Not on file   Number of children: 3   Years of education: Not on file   Highest education level: Bachelor's degree (e.g., BA, AB, BS)  Occupational History   Occupation: Retired  Tobacco Use   Smoking status: Former    Packs/day: 0.25    Years: 50.00    Total pack years: 12.50    Types: Cigarettes    Quit date: 05/03/2018    Years since quitting: 3.7   Smokeless tobacco: Former   Tobacco comments:    pt quit smoking  Vaping Use   Vaping Use: Never used  Substance and Sexual Activity   Alcohol use: Yes    Comment: Occasional -1x/month   Drug use: No   Sexual activity: Yes  Other Topics Concern   Not on file  Social History Narrative  Not on file   Social Determinants of Health   Financial Resource Strain: Low Risk  (02/26/2021)   Overall Financial Resource Strain (CARDIA)    Difficulty of Paying Living Expenses: Not hard at all  Food Insecurity: No Food Insecurity (02/26/2021)   Hunger Vital Sign    Worried About Running Out of Food in the Last Year: Never true    Ran Out of Food in the Last Year: Never true  Transportation Needs: No Transportation Needs (02/26/2021)   PRAPARE - Hydrologist (Medical): No    Lack of Transportation (Non-Medical): No  Physical Activity: Insufficiently Active (02/26/2021)   Exercise Vital Sign    Days of Exercise per Week: 3 days    Minutes of Exercise per Session: 30 min  Stress: No Stress Concern Present (02/26/2021)   Wisdom    Feeling of Stress : Only a little  Social Connections: Moderately Integrated (02/26/2021)   Social Connection and Isolation  Panel [NHANES]    Frequency of Communication with Friends and Family: More than three times a week    Frequency of Social Gatherings with Friends and Family: Once a week    Attends Religious Services: More than 4 times per year    Active Member of [employer] or Organizations: No    Attends Archivist Meetings: Never    Marital Status: Married  Human resources officer Violence: Not At Risk (02/26/2021)   Humiliation, Afraid, Rape, and Kick questionnaire    Fear of Current or Ex-Partner: No    Emotionally Abused: No    Physically Abused: No    Sexually Abused: No    Past Surgical History:  Procedure Laterality Date   CATARACT EXTRACTION W/PHACO Right 03/09/2017   Procedure: CATARACT EXTRACTION PHACO AND INTRAOCULAR LENS PLACEMENT ([place]) RIGHT;  Surgeon: Eulogio Bear, MD;  Location: [place];  Service: Ophthalmology;  Laterality: Right;   CATARACT EXTRACTION W/PHACO Left 08/25/2021   Procedure: CATARACT EXTRACTION PHACO AND INTRAOCULAR LENS PLACEMENT ([place]) LEFT;  Surgeon: Eulogio Bear, MD;  Location: [place];  Service: Ophthalmology;  Laterality: Left;   CATARACT EXTRACTION W/PHACO Left 10/13/2021   Procedure: CATARACT EXTRACTION PHACO AND INTRAOCULAR LENS PLACEMENT ([place]) LEFT;  Surgeon: Eulogio Bear, MD;  Location: [place];  Service: Ophthalmology;  Laterality: Left;  8.12 0:52.0   COLONOSCOPY  2013   normal- cleared for 5 yrs- Dr Beckey Downing   COLONOSCOPY WITH PROPOFOL N/A 02/17/2017   Procedure: COLONOSCOPY WITH PROPOFOL;  Surgeon: Lin Landsman, MD;  Location: [place];  Service: Endoscopy;  Laterality: N/A;   COLONOSCOPY WITH PROPOFOL N/A 10/12/2019   Procedure: COLONOSCOPY WITH BIOPSY;  Surgeon: Lin Landsman, MD;  Location: [place];  Service: Endoscopy;  Laterality: N/A;   EPIGASTRIC HERNIA REPAIR N/A 07/06/2014   Procedure: HERNIA REPAIR EPIGASTRIC ADULT;  Surgeon: Molly Maduro, MD;  Location: [facility];   Service: General;  Laterality: N/A;   HERNIA REPAIR Bilateral    INSERTION OF MESH N/A 07/06/2014   Procedure: INSERTION OF MESH;  Surgeon: Molly Maduro, MD;  Location: [facility];  Service: General;  Laterality: N/A;   IR RADIOLOGIST EVAL & MGMT  11/07/2019   IR RADIOLOGIST EVAL & MGMT  04/25/2020   IR RADIOLOGIST EVAL & MGMT  12/05/2020   IR RADIOLOGIST EVAL & MGMT  06/30/2021   POLYPECTOMY  02/17/2017   Procedure: POLYPECTOMY;  Surgeon: Lin Landsman, MD;  Location: [place];  Service: Endoscopy;;   POLYPECTOMY N/A 10/12/2019   Procedure: POLYPECTOMY;  Surgeon: Lin Landsman, MD;  Location: [place];  Service: Endoscopy;  Laterality: N/A;   SHOULDER ARTHROSCOPY Left 2008   SINUS EXPLORATION      Family History  Problem Relation Age of Onset   Diabetes Father    Heart disease Father 3   Stroke Mother     Allergies  Allergen Reactions   Amoxicillin-Pot Clavulanate Hives and Nausea And Vomiting       Latest Ref Rng & Units 01/23/2022    9:31 AM 01/16/2022    9:17 AM 01/09/2022    9:31 AM  CBC  Hemoglobin 13.0 - 17.0 g/dL 9.5  9.4  9.0   Hematocrit 39.0 - 52.0 % 29.3  29.1  27.7       CMP     Component Value Date/Time   NA 135 11/14/2021 1350   NA 141 07/19/2019 0948   K 4.3 11/14/2021 1350   CL 103 11/14/2021 1350   CO2 26 11/14/2021 1350   GLUCOSE 148 (H) 11/14/2021 1350   BUN 21 11/14/2021 1350   BUN 20 07/19/2019 0948   CREATININE 1.04 11/14/2021 1350   CALCIUM 8.8 (L) 11/14/2021 1350   PROT 6.6 11/14/2021 1350   PROT 6.4 07/19/2019 0948   ALBUMIN 3.9 11/14/2021 1350   ALBUMIN 4.1 07/23/2020 0922   AST 24 11/14/2021 1350   ALT 18 11/14/2021 1350   ALKPHOS 40 11/14/2021 1350   BILITOT 1.1 11/14/2021 1350   BILITOT 0.8 07/23/2020 0922   GFRNONAA >60 11/14/2021 1350   GFRAA 84 07/19/2019 0948     No results found.     Assessment & Plan:   1. Venous ulcer ([place]) Patient has a very small wound on his right lower  extremity.  This is improved after several weeks of Unna wraps.  The patient is advised to continue with the use of mupirocin ointment on the area change daily.  He is also advised to continue with use of medical grade compression stockings daily.  2. Venous stasis dermatitis of both lower extremities The patient has significant stasis dermatitis noted bilaterally.  He also has notable varicosities bilaterally.  Will have the patient return with a venous duplex in order to evaluate for venous reflux.  We discussed the conservative therapies necessary for venous insufficiency such as use of medical grade compression stockings 20 to 30 mmHg.  They should be worn daily.  They should be placed first thing in the morning and remove before bedtime.  They should not sleep in the stockings.  He should elevate his lower extremities when not active.  Also 15 to 20 minutes of activity 3 to 4 days/week would also be helpful.   Current Outpatient Medications on File Prior to Visit  Medication Sig Dispense Refill   acetaminophen (TYLENOL) 325 MG tablet Take 650 mg by mouth every 6 (six) hours as needed.     Calcium Carb-Cholecalciferol 600-400 MG-UNIT TABS Take by mouth.     Epoetin Alfa-epbx (RETACRIT IJ)      ibuprofen (ADVIL) 200 MG tablet Take 200 mg by mouth every 6 (six) hours as needed.     meloxicam (MOBIC) 15 MG tablet TAKE 1 TABLET (15 MG TOTAL) BY MOUTH DAILY. 30 tablet 1   mupirocin ointment (BACTROBAN) 2 % Apply 1 Application topically 2 (two) times daily. 22 g 0   tadalafil (CIALIS) 5 MG tablet TAKE ONE TABLET BY  MOUTH DAILY 30 tablet 11   doxycycline (VIBRA-TABS) 100 MG tablet Take 1 tablet (100 mg total) by mouth 2 (two) times daily. (Patient not taking: Reported on 01/23/2022) 20 tablet 0   predniSONE (DELTASONE) 10 MG tablet Take 1 tablet (10 mg total) by mouth daily with breakfast. 30 tablet 0   Current Facility-Administered Medications on File Prior to Visit  Medication Dose Route Frequency  Provider Last Rate Last Admin   Darbepoetin Alfa (ARANESP) injection 300 mcg  300 mcg Subcutaneous Weekly Sindy Guadeloupe, MD   300 mcg at 12/26/21 1013    There are no Patient Instructions on file for this visit. No follow-ups on file.   Kris Hartmann, NP

## 2022-01-27 DIAGNOSIS — R262 Difficulty in walking, not elsewhere classified: Secondary | ICD-10-CM | POA: Diagnosis not present

## 2022-01-27 DIAGNOSIS — M5432 Sciatica, left side: Secondary | ICD-10-CM | POA: Diagnosis not present

## 2022-01-29 ENCOUNTER — Telehealth: Payer: Self-pay | Admitting: *Deleted

## 2022-01-29 DIAGNOSIS — M5432 Sciatica, left side: Secondary | ICD-10-CM | POA: Diagnosis not present

## 2022-01-29 DIAGNOSIS — R262 Difficulty in walking, not elsewhere classified: Secondary | ICD-10-CM | POA: Diagnosis not present

## 2022-01-29 NOTE — Patient Outreach (Signed)
  Care Coordination   01/29/2022 Name: Brett Boone MRN: [id number] DOB: 1945/04/04   Care Coordination Outreach Attempts:  A second unsuccessful outreach was attempted today to offer the patient with information about available care coordination services as a benefit of their health plan.     Follow Up Plan:  Additional outreach attempts will be made to offer the patient care coordination information and services.   Encounter Outcome:  No Answer   Care Coordination Interventions:  No, not indicated    [name], RN, MSN, [facility] Care Management Care Management Coordinator [phone]

## 2022-01-30 ENCOUNTER — Inpatient Hospital Stay: Payer: Medicare Other

## 2022-01-30 ENCOUNTER — Inpatient Hospital Stay: Payer: Medicare Other | Attending: Nurse Practitioner

## 2022-01-30 VITALS — BP 121/65

## 2022-01-30 DIAGNOSIS — D461 Refractory anemia with ring sideroblasts: Secondary | ICD-10-CM | POA: Diagnosis not present

## 2022-01-30 LAB — HEMOGLOBIN AND HEMATOCRIT, BLOOD
HCT: 29.3 % — ABNORMAL LOW (ref 39.0–52.0)
Hemoglobin: 9.3 g/dL — ABNORMAL LOW (ref 13.0–17.0)

## 2022-01-30 MED ORDER — DARBEPOETIN ALFA 300 MCG/0.6ML IJ SOSY
300.0000 ug | PREFILLED_SYRINGE | INTRAMUSCULAR | Status: DC
  Administered 2022-01-30: 300 ug via SUBCUTANEOUS
  Filled 2022-01-30: qty 0.6

## 2022-02-02 DIAGNOSIS — R262 Difficulty in walking, not elsewhere classified: Secondary | ICD-10-CM | POA: Diagnosis not present

## 2022-02-02 DIAGNOSIS — M5432 Sciatica, left side: Secondary | ICD-10-CM | POA: Diagnosis not present

## 2022-02-03 ENCOUNTER — Telehealth: Payer: Self-pay | Admitting: *Deleted

## 2022-02-03 NOTE — Patient Outreach (Signed)
  Care Coordination   02/03/2022 Name: Brett Boone MRN: [id number] DOB: 1945-06-08   Care Coordination Outreach Attempts:  A third unsuccessful outreach was attempted today to offer the patient with information about available care coordination services as a benefit of their health plan.   Follow Up Plan:  Additional outreach attempts will be made to offer the patient care coordination information and services.   Encounter Outcome:  No Answer   Care Coordination Interventions:  No, not indicated    [name], RN, MSN, [facility] Care Management Care Management Coordinator [phone]

## 2022-02-04 ENCOUNTER — Other Ambulatory Visit (INDEPENDENT_AMBULATORY_CARE_PROVIDER_SITE_OTHER): Payer: Self-pay | Admitting: Nurse Practitioner

## 2022-02-04 DIAGNOSIS — I872 Venous insufficiency (chronic) (peripheral): Secondary | ICD-10-CM

## 2022-02-04 DIAGNOSIS — R262 Difficulty in walking, not elsewhere classified: Secondary | ICD-10-CM | POA: Diagnosis not present

## 2022-02-04 DIAGNOSIS — M5432 Sciatica, left side: Secondary | ICD-10-CM | POA: Diagnosis not present

## 2022-02-06 ENCOUNTER — Inpatient Hospital Stay: Payer: Medicare Other

## 2022-02-06 VITALS — BP 147/74

## 2022-02-06 DIAGNOSIS — D461 Refractory anemia with ring sideroblasts: Secondary | ICD-10-CM | POA: Diagnosis not present

## 2022-02-06 LAB — HEMOGLOBIN AND HEMATOCRIT, BLOOD
HCT: 30.4 % — ABNORMAL LOW (ref 39.0–52.0)
Hemoglobin: 9.9 g/dL — ABNORMAL LOW (ref 13.0–17.0)

## 2022-02-06 MED ORDER — DARBEPOETIN ALFA 300 MCG/0.6ML IJ SOSY
300.0000 ug | PREFILLED_SYRINGE | INTRAMUSCULAR | Status: DC
  Administered 2022-02-06: 300 ug via SUBCUTANEOUS
  Filled 2022-02-06: qty 0.6

## 2022-02-09 ENCOUNTER — Ambulatory Visit (INDEPENDENT_AMBULATORY_CARE_PROVIDER_SITE_OTHER): Payer: Medicare Other | Admitting: Nurse Practitioner

## 2022-02-09 ENCOUNTER — Ambulatory Visit (INDEPENDENT_AMBULATORY_CARE_PROVIDER_SITE_OTHER): Payer: Medicare Other

## 2022-02-09 ENCOUNTER — Encounter (INDEPENDENT_AMBULATORY_CARE_PROVIDER_SITE_OTHER): Payer: Self-pay | Admitting: Nurse Practitioner

## 2022-02-09 VITALS — BP 135/74 | HR 60 | Ht 70.0 in | Wt 261.0 lb

## 2022-02-09 DIAGNOSIS — L97909 Non-pressure chronic ulcer of unspecified part of unspecified lower leg with unspecified severity: Secondary | ICD-10-CM | POA: Diagnosis not present

## 2022-02-09 DIAGNOSIS — I872 Venous insufficiency (chronic) (peripheral): Secondary | ICD-10-CM

## 2022-02-09 DIAGNOSIS — I83009 Varicose veins of unspecified lower extremity with ulcer of unspecified site: Secondary | ICD-10-CM | POA: Diagnosis not present

## 2022-02-10 ENCOUNTER — Encounter (INDEPENDENT_AMBULATORY_CARE_PROVIDER_SITE_OTHER): Payer: Self-pay | Admitting: Nurse Practitioner

## 2022-02-11 DIAGNOSIS — M5432 Sciatica, left side: Secondary | ICD-10-CM | POA: Diagnosis not present

## 2022-02-11 DIAGNOSIS — R262 Difficulty in walking, not elsewhere classified: Secondary | ICD-10-CM | POA: Diagnosis not present

## 2022-02-13 ENCOUNTER — Inpatient Hospital Stay: Payer: Medicare Other

## 2022-02-13 VITALS — BP 118/68

## 2022-02-13 DIAGNOSIS — D461 Refractory anemia with ring sideroblasts: Secondary | ICD-10-CM | POA: Diagnosis not present

## 2022-02-13 LAB — HEMOGLOBIN AND HEMATOCRIT, BLOOD
HCT: 29.8 % — ABNORMAL LOW (ref 39.0–52.0)
Hemoglobin: 9.8 g/dL — ABNORMAL LOW (ref 13.0–17.0)

## 2022-02-13 MED ORDER — DARBEPOETIN ALFA 300 MCG/0.6ML IJ SOSY
300.0000 ug | PREFILLED_SYRINGE | INTRAMUSCULAR | Status: AC
  Administered 2022-02-13: 300 ug via SUBCUTANEOUS
  Filled 2022-02-13: qty 0.6

## 2022-02-18 ENCOUNTER — Other Ambulatory Visit: Payer: Self-pay | Admitting: Acute Care

## 2022-02-18 DIAGNOSIS — Z87891 Personal history of nicotine dependence: Secondary | ICD-10-CM

## 2022-02-20 ENCOUNTER — Telehealth: Payer: Self-pay

## 2022-02-20 ENCOUNTER — Other Ambulatory Visit (HOSPITAL_COMMUNITY): Payer: Self-pay

## 2022-02-20 ENCOUNTER — Encounter: Payer: Self-pay | Admitting: Oncology

## 2022-02-20 ENCOUNTER — Inpatient Hospital Stay: Payer: Medicare Other

## 2022-02-20 VITALS — BP 126/73 | HR 68 | Resp 18

## 2022-02-20 DIAGNOSIS — D461 Refractory anemia with ring sideroblasts: Secondary | ICD-10-CM

## 2022-02-20 LAB — HEMOGLOBIN AND HEMATOCRIT, BLOOD
HCT: 29.4 % — ABNORMAL LOW (ref 39.0–52.0)
Hemoglobin: 9.5 g/dL — ABNORMAL LOW (ref 13.0–17.0)

## 2022-02-20 MED ORDER — DARBEPOETIN ALFA 300 MCG/0.6ML IJ SOSY
300.0000 ug | PREFILLED_SYRINGE | INTRAMUSCULAR | Status: DC
  Administered 2022-02-20: 300 ug via SUBCUTANEOUS
  Filled 2022-02-20: qty 0.6

## 2022-02-20 NOTE — Telephone Encounter (Signed)
Oral Oncology Patient Advocate Encounter  Received notification from MD Office that patient dropped off approval letter for a grant. Looking at patient's chart, it appears to be used for Aranresp Injection.    [employer] ID: [id number]   RxBin: [id number] PCN: [id number] Member ID: [id number] Group ID: [id number] Dates of Eligibility: 01/04/22 through 01/04/23  Fund:  [employer], [facility] Patient [place]  [phone] (phone) [phone] (fax) 02/20/2022 11:04 AM

## 2022-02-23 DIAGNOSIS — M5432 Sciatica, left side: Secondary | ICD-10-CM | POA: Diagnosis not present

## 2022-02-23 DIAGNOSIS — R262 Difficulty in walking, not elsewhere classified: Secondary | ICD-10-CM | POA: Diagnosis not present

## 2022-02-23 NOTE — Progress Notes (Signed)
Subjective:    Patient ID: Brett Boone, male    DOB: 1945-06-02, 77 y.o.   MRN: [id number] Chief Complaint  Patient presents with   Follow-up    B I venous reflux study    Brett Boone is a 77 year old male who presents today for evaluation by Dr. Ronnald Ramp in regards to venous ulcer as well as stasis dermatitis.  The patient recently saw dermatology who wrapped his left lower extremity and Unna boots for several weeks.  Prior to this the wound developed as a's very small area but it was plagued with weeping and drainage.  At the previous visit the wound was very small but it is completely healed.  The patient has significant stasis dermatitis bilaterally and notes that this has been present for years.  He has also been having some worsening swelling in his legs.  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  Currently he denies any claudication-like symptoms or rest pain.  There are no additional open wounds or ulcerations.  Today noninvasive study showed no evidence of DVT or superficial thrombophlebitis bilaterally.  There is evidence of deep venous insufficiency bilaterally as well as superficial venous reflux bilaterally.    Review of Systems  Cardiovascular:  Positive for leg swelling.  All other systems reviewed and are negative.      Objective:   Physical Exam Vitals reviewed.  HENT:     Head: Normocephalic.  Cardiovascular:     Rate and Rhythm: Normal rate.     Pulses: Normal pulses.  Pulmonary:     Effort: Pulmonary effort is normal.  Musculoskeletal:     Right lower leg: Edema present.     Left lower leg: Edema present.  Skin:    General: Skin is warm and dry.     Comments: Bilateral stasis dermatitis  Neurological:     Mental Status: He is alert and oriented to person, place, and time.  Psychiatric:        Mood and Affect: Mood normal.        Behavior: Behavior normal.        Thought Content: Thought content normal.        Judgment: Judgment normal.      BP 135/74   Pulse 60   Ht '5\' 10"'$  (1.778 m)   Wt 261 lb (118.4 kg)   BMI 37.45 kg/m   Past Medical History:  Diagnosis Date   Anemia    Heart murmur    Hydrocele    MDS (myelodysplastic syndrome) (HCC)    Rotator cuff tear arthropathy of right shoulder 08/01/2020   Venous stasis dermatitis of both lower extremities     Social History   Socioeconomic History   Marital status: Married    Spouse name: Not on file   Number of children: 3   Years of education: Not on file   Highest education level: Bachelor's degree (e.g., BA, AB, BS)  Occupational History   Occupation: Retired  Tobacco Use   Smoking status: Former    Packs/day: 0.25    Years: 50.00    Total pack years: 12.50    Types: Cigarettes    Quit date: 05/03/2018    Years since quitting: 3.8   Smokeless tobacco: Former   Tobacco comments:    pt quit smoking  Vaping Use   Vaping Use: Never used  Substance and Sexual Activity   Alcohol use: Yes    Comment: Occasional -1x/month   Drug use: No   Sexual  activity: Yes  Other Topics Concern   Not on file  Social History Narrative   Not on file   Social Determinants of Health   Financial Resource Strain: Low Risk  (02/26/2021)   Overall Financial Resource Strain (CARDIA)    Difficulty of Paying Living Expenses: Not hard at all  Food Insecurity: No Food Insecurity (02/26/2021)   Hunger Vital Sign    Worried About Running Out of Food in the Last Year: Never true    Ran Out of Food in the Last Year: Never true  Transportation Needs: No Transportation Needs (02/26/2021)   PRAPARE - Hydrologist (Medical): No    Lack of Transportation (Non-Medical): No  Physical Activity: Insufficiently Active (02/26/2021)   Exercise Vital Sign    Days of Exercise per Week: 3 days    Minutes of Exercise per Session: 30 min  Stress: No Stress Concern Present (02/26/2021)   [place]     Feeling of Stress : Only a little  Social Connections: Moderately Integrated (02/26/2021)   Social Connection and Isolation Panel [NHANES]    Frequency of Communication with Friends and Family: More than three times a week    Frequency of Social Gatherings with Friends and Family: Once a week    Attends Religious Services: More than 4 times per year    Active Member of [employer] or Organizations: No    Attends Archivist Meetings: Never    Marital Status: Married  Human resources officer Violence: Not At Risk (02/26/2021)   Humiliation, Afraid, Rape, and Kick questionnaire    Fear of Current or Ex-Partner: No    Emotionally Abused: No    Physically Abused: No    Sexually Abused: No    Past Surgical History:  Procedure Laterality Date   CATARACT EXTRACTION W/PHACO Right 03/09/2017   Procedure: CATARACT EXTRACTION PHACO AND INTRAOCULAR LENS PLACEMENT ([place]) RIGHT;  Surgeon: Brett Bear, MD;  Location: [facility];  Service: Ophthalmology;  Laterality: Right;   CATARACT EXTRACTION W/PHACO Left 08/25/2021   Procedure: CATARACT EXTRACTION PHACO AND INTRAOCULAR LENS PLACEMENT ([facility]) LEFT;  Surgeon: Brett Bear, MD;  Location: [facility];  Service: Ophthalmology;  Laterality: Left;   CATARACT EXTRACTION W/PHACO Left 10/13/2021   Procedure: CATARACT EXTRACTION PHACO AND INTRAOCULAR LENS PLACEMENT ([place]) LEFT;  Surgeon: Brett Bear, MD;  Location: [facility];  Service: Ophthalmology;  Laterality: Left;  8.12 0:52.0   COLONOSCOPY  2013   normal- cleared for 5 yrs- Dr Beckey Downing   COLONOSCOPY WITH PROPOFOL N/A 02/17/2017   Procedure: COLONOSCOPY WITH PROPOFOL;  Surgeon: Brett Landsman, MD;  Location: [facility];  Service: Endoscopy;  Laterality: N/A;   COLONOSCOPY WITH PROPOFOL N/A 10/12/2019   Procedure: COLONOSCOPY WITH BIOPSY;  Surgeon: Brett Landsman, MD;  Location: [facility];  Service: Endoscopy;  Laterality: N/A;   EPIGASTRIC  HERNIA REPAIR N/A 07/06/2014   Procedure: HERNIA REPAIR EPIGASTRIC ADULT;  Surgeon: Brett Maduro, MD;  Location: [facility];  Service: General;  Laterality: N/A;   HERNIA REPAIR Bilateral    INSERTION OF MESH N/A 07/06/2014   Procedure: INSERTION OF MESH;  Surgeon: Brett Maduro, MD;  Location: [facility];  Service: General;  Laterality: N/A;   IR RADIOLOGIST EVAL & MGMT  11/07/2019   IR RADIOLOGIST EVAL & MGMT  04/25/2020   IR RADIOLOGIST EVAL & MGMT  12/05/2020   IR RADIOLOGIST EVAL & MGMT  06/30/2021   POLYPECTOMY  02/17/2017   Procedure: POLYPECTOMY;  Surgeon: Brett Landsman, MD;  Location: [facility];  Service: Endoscopy;;   POLYPECTOMY N/A 10/12/2019   Procedure: POLYPECTOMY;  Surgeon: Brett Landsman, MD;  Location: [facility];  Service: Endoscopy;  Laterality: N/A;   SHOULDER ARTHROSCOPY Left 2008   SINUS EXPLORATION      Family History  Problem Relation Age of Onset   Diabetes Father    Heart disease Father 24   Stroke Mother     Allergies  Allergen Reactions   Amoxicillin-Pot Clavulanate Hives and Nausea And Vomiting       Latest Ref Rng & Units 02/20/2022    9:27 AM 02/13/2022    9:28 AM 02/06/2022    9:34 AM  CBC  Hemoglobin 13.0 - 17.0 g/dL 9.5  9.8  9.9   Hematocrit 39.0 - 52.0 % 29.4  29.8  30.4       CMP     Component Value Date/Time   NA 135 11/14/2021 1350   NA 141 07/19/2019 0948   K 4.3 11/14/2021 1350   CL 103 11/14/2021 1350   CO2 26 11/14/2021 1350   GLUCOSE 148 (H) 11/14/2021 1350   BUN 21 11/14/2021 1350   BUN 20 07/19/2019 0948   CREATININE 1.04 11/14/2021 1350   CALCIUM 8.8 (L) 11/14/2021 1350   PROT 6.6 11/14/2021 1350   PROT 6.4 07/19/2019 0948   ALBUMIN 3.9 11/14/2021 1350   ALBUMIN 4.1 07/23/2020 0922   AST 24 11/14/2021 1350   ALT 18 11/14/2021 1350   ALKPHOS 40 11/14/2021 1350   BILITOT 1.1 11/14/2021 1350   BILITOT 0.8 07/23/2020 0922   GFRNONAA >60 11/14/2021 1350   GFRAA 84 07/19/2019 0948      No results found.     Assessment & Plan:   1. Venous ulcer ([place]) This is resolved  2. Venous stasis dermatitis of both lower extremities The patient has noted venous insufficiency bilaterally.  We discussed possible treatment options ranging from invasive with an endovenous laser ablation versus conservative therapy.  We discussed the conservative therapies necessary for venous insufficiency such as use of medical grade compression stockings 20 to 30 mmHg.  They should be worn daily.  They should be placed first thing in the morning and remove before bedtime.  They should not sleep in the stockings.  He should elevate his lower extremities when not active.  Also 15 to 20 minutes of activity 3 to 4 days/week would also be helpful.  Following this discussion the patient will continue with conservative therapy as he currently does not have any pain in his legs caused by the varicosities.  Patient is advised that if his symptoms change or if he begins to have worsening ulcers he should follow-up with [place] for further evaluation.  Otherwise he will follow-up on an as-needed basis.   Current Outpatient Medications on File Prior to Visit  Medication Sig Dispense Refill   acetaminophen (TYLENOL) 325 MG tablet Take 650 mg by mouth every 6 (six) hours as needed.     Calcium Carb-Cholecalciferol 600-400 MG-UNIT TABS Take by mouth.     ibuprofen (ADVIL) 200 MG tablet Take 200 mg by mouth every 6 (six) hours as needed.     meloxicam (MOBIC) 15 MG tablet TAKE 1 TABLET (15 MG TOTAL) BY MOUTH DAILY. 30 tablet 1   tadalafil (CIALIS) 5 MG tablet TAKE ONE TABLET BY MOUTH DAILY 30 tablet 11   Epoetin Alfa-epbx (  RETACRIT IJ)      mupirocin ointment (BACTROBAN) 2 % Apply 1 Application topically 2 (two) times daily. 22 g 0   Current Facility-Administered Medications on File Prior to Visit  Medication Dose Route Frequency Provider Last Rate Last Admin   Darbepoetin Alfa (ARANESP) injection 300 mcg  300 mcg  Subcutaneous Weekly Sindy Guadeloupe, MD   300 mcg at 12/26/21 1013    There are no Patient Instructions on file for this visit. No follow-ups on file.   Kris Hartmann, NP

## 2022-02-27 ENCOUNTER — Inpatient Hospital Stay: Payer: Medicare Other

## 2022-02-27 ENCOUNTER — Inpatient Hospital Stay: Payer: Medicare Other | Attending: Nurse Practitioner

## 2022-02-27 VITALS — BP 128/61

## 2022-02-27 DIAGNOSIS — D461 Refractory anemia with ring sideroblasts: Secondary | ICD-10-CM | POA: Diagnosis not present

## 2022-02-27 LAB — HEMOGLOBIN AND HEMATOCRIT, BLOOD
HCT: 27.1 % — ABNORMAL LOW (ref 39.0–52.0)
Hemoglobin: 8.9 g/dL — ABNORMAL LOW (ref 13.0–17.0)

## 2022-02-27 MED ORDER — DARBEPOETIN ALFA 300 MCG/0.6ML IJ SOSY
300.0000 ug | PREFILLED_SYRINGE | INTRAMUSCULAR | Status: DC
  Administered 2022-02-27: 300 ug via SUBCUTANEOUS
  Filled 2022-02-27: qty 0.6

## 2022-03-02 ENCOUNTER — Ambulatory Visit: Payer: Medicare Other

## 2022-03-02 DIAGNOSIS — R262 Difficulty in walking, not elsewhere classified: Secondary | ICD-10-CM | POA: Diagnosis not present

## 2022-03-02 DIAGNOSIS — M5432 Sciatica, left side: Secondary | ICD-10-CM | POA: Diagnosis not present

## 2022-03-05 ENCOUNTER — Other Ambulatory Visit: Payer: Self-pay

## 2022-03-05 DIAGNOSIS — D461 Refractory anemia with ring sideroblasts: Secondary | ICD-10-CM

## 2022-03-06 ENCOUNTER — Inpatient Hospital Stay: Payer: Medicare Other

## 2022-03-06 VITALS — BP 111/51

## 2022-03-06 DIAGNOSIS — D461 Refractory anemia with ring sideroblasts: Secondary | ICD-10-CM

## 2022-03-06 LAB — HEMOGLOBIN AND HEMATOCRIT, BLOOD
HCT: 28.9 % — ABNORMAL LOW (ref 39.0–52.0)
Hemoglobin: 9.5 g/dL — ABNORMAL LOW (ref 13.0–17.0)

## 2022-03-06 MED ORDER — DARBEPOETIN ALFA 300 MCG/0.6ML IJ SOSY
300.0000 ug | PREFILLED_SYRINGE | INTRAMUSCULAR | Status: DC
  Administered 2022-03-06: 300 ug via SUBCUTANEOUS
  Filled 2022-03-06: qty 0.6

## 2022-03-12 ENCOUNTER — Ambulatory Visit (INDEPENDENT_AMBULATORY_CARE_PROVIDER_SITE_OTHER): Payer: Medicare Other

## 2022-03-12 VITALS — Ht 70.0 in | Wt 261.0 lb

## 2022-03-12 DIAGNOSIS — Z0000 Encounter for general adult medical examination without abnormal findings: Secondary | ICD-10-CM

## 2022-03-12 DIAGNOSIS — M5432 Sciatica, left side: Secondary | ICD-10-CM | POA: Diagnosis not present

## 2022-03-12 DIAGNOSIS — R262 Difficulty in walking, not elsewhere classified: Secondary | ICD-10-CM | POA: Diagnosis not present

## 2022-03-12 NOTE — Patient Instructions (Signed)
Brett Boone , Thank you for taking time to come for your Medicare Wellness Visit. I appreciate your ongoing commitment to your health goals. Please review the following plan we discussed and let me know if I can assist you in the future.   These are the goals we discussed:  Goals      DIET - EAT MORE FRUITS AND VEGETABLES     Have 3 meals a day     Eat 3 healthy meals per day and 2 healthy snacks per day     Increase physical activity     Recommend increasing physical activity to at least 3 days per week      Weight (lb) < 200 lb (90.7 kg)     Pt would like to lose 5% of weight over the next year with healthy eating and physical activity        This is a list of the screening recommended for you and due dates:  Health Maintenance  Topic Date Due   Zoster (Shingles) Vaccine (1 of 2) Never done   COVID-19 Vaccine (4 - 2023-24 season) 09/26/2021   Colon Cancer Screening  10/12/2022   Medicare Annual Wellness Visit  03/13/2023   DTaP/Tdap/Td vaccine (2 - Td or Tdap) 08/29/2024   Pneumonia Vaccine  Completed   Flu Shot  Completed   Hepatitis C Screening: USPSTF Recommendation to screen - Ages 56-79 yo.  Completed   HPV Vaccine  Aged Out    Advanced directives: no  Conditions/risks identified: none  Next appointment: Follow up in one year for your annual wellness visit. 03/18/23 @ 2:00 pm by phone  Preventive Care 65 Years and Older, Male  Preventive care refers to lifestyle choices and visits with your health care provider that can promote health and wellness. What does preventive care include? A yearly physical exam. This is also called an annual well check. Dental exams once or twice a year. Routine eye exams. Ask your health care provider how often you should have your eyes checked. Personal lifestyle choices, including: Daily care of your teeth and gums. Regular physical activity. Eating a healthy diet. Avoiding tobacco and drug use. Limiting alcohol use. Practicing  safe sex. Taking low doses of aspirin every day. Taking vitamin and mineral supplements as recommended by your health care provider. What happens during an annual well check? The services and screenings done by your health care provider during your annual well check will depend on your age, overall health, lifestyle risk factors, and family history of disease. Counseling  Your health care provider may ask you questions about your: Alcohol use. Tobacco use. Drug use. Emotional well-being. Home and relationship well-being. Sexual activity. Eating habits. History of falls. Memory and ability to understand (cognition). Work and work Statistician. Screening  You may have the following tests or measurements: Height, weight, and BMI. Blood pressure. Lipid and cholesterol levels. These may be checked every 5 years, or more frequently if you are over 48 years old. Skin check. Lung cancer screening. You may have this screening every year starting at age 24 if you have a 30-pack-year history of smoking and currently smoke or have quit within the past 15 years. Fecal occult blood test (FOBT) of the stool. You may have this test every year starting at age 67. Flexible sigmoidoscopy or colonoscopy. You may have a sigmoidoscopy every 5 years or a colonoscopy every 10 years starting at age 3. Prostate cancer screening. Recommendations will vary depending on your family history and  other risks. Hepatitis C blood test. Hepatitis B blood test. Sexually transmitted disease (STD) testing. Diabetes screening. This is done by checking your blood sugar (glucose) after you have not eaten for a while (fasting). You may have this done every 1-3 years. Abdominal aortic aneurysm (AAA) screening. You may need this if you are a current or former smoker. Osteoporosis. You may be screened starting at age 10 if you are at high risk. Talk with your health care provider about your test results, treatment options, and  if necessary, the need for more tests. Vaccines  Your health care provider may recommend certain vaccines, such as: Influenza vaccine. This is recommended every year. Tetanus, diphtheria, and acellular pertussis (Tdap, Td) vaccine. You may need a Td booster every 10 years. Zoster vaccine. You may need this after age 18. Pneumococcal 13-valent conjugate (PCV13) vaccine. One dose is recommended after age 19. Pneumococcal polysaccharide (PPSV23) vaccine. One dose is recommended after age 75. Talk to your health care provider about which screenings and vaccines you need and how often you need them. This information is not intended to replace advice given to you by your health care provider. Make sure you discuss any questions you have with your health care provider. Document Released: 02/08/2015 Document Revised: 10/02/2015 Document Reviewed: 11/13/2014 Elsevier Interactive Patient Education  2017 [place] Prevention in the Home Falls can cause injuries. They can happen to people of all ages. There are many things you can do to make your home safe and to help prevent falls. What can I do on the outside of my home? Regularly fix the edges of walkways and driveways and fix any cracks. Remove anything that might make you trip as you walk through a door, such as a raised step or threshold. Trim any bushes or trees on the path to your home. Use bright outdoor lighting. Clear any walking paths of anything that might make someone trip, such as rocks or tools. Regularly check to see if handrails are loose or broken. Make sure that both sides of any steps have handrails. Any raised decks and porches should have guardrails on the edges. Have any leaves, snow, or ice cleared regularly. Use sand or salt on walking paths during winter. Clean up any spills in your garage right away. This includes oil or grease spills. What can I do in the bathroom? Use night lights. Install grab bars by the  toilet and in the tub and shower. Do not use towel bars as grab bars. Use non-skid mats or decals in the tub or shower. If you need to sit down in the shower, use a plastic, non-slip stool. Keep the floor dry. Clean up any water that spills on the floor as soon as it happens. Remove soap buildup in the tub or shower regularly. Attach bath mats securely with double-sided non-slip rug tape. Do not have throw rugs and other things on the floor that can make you trip. What can I do in the bedroom? Use night lights. Make sure that you have a light by your bed that is easy to reach. Do not use any sheets or blankets that are too big for your bed. They should not hang down onto the floor. Have a firm chair that has side arms. You can use this for support while you get dressed. Do not have throw rugs and other things on the floor that can make you trip. What can I do in the kitchen? Clean up any spills right  away. Avoid walking on wet floors. Keep items that you use a lot in easy-to-reach places. If you need to reach something above you, use a strong step stool that has a grab bar. Keep electrical cords out of the way. Do not use floor polish or wax that makes floors slippery. If you must use wax, use non-skid floor wax. Do not have throw rugs and other things on the floor that can make you trip. What can I do with my stairs? Do not leave any items on the stairs. Make sure that there are handrails on both sides of the stairs and use them. Fix handrails that are broken or loose. Make sure that handrails are as long as the stairways. Check any carpeting to make sure that it is firmly attached to the stairs. Fix any carpet that is loose or worn. Avoid having throw rugs at the top or bottom of the stairs. If you do have throw rugs, attach them to the floor with carpet tape. Make sure that you have a light switch at the top of the stairs and the bottom of the stairs. If you do not have them, ask someone  to add them for you. What else can I do to help prevent falls? Wear shoes that: Do not have high heels. Have rubber bottoms. Are comfortable and fit you well. Are closed at the toe. Do not wear sandals. If you use a stepladder: Make sure that it is fully opened. Do not climb a closed stepladder. Make sure that both sides of the stepladder are locked into place. Ask someone to hold it for you, if possible. Clearly mark and make sure that you can see: Any grab bars or handrails. First and last steps. Where the edge of each step is. Use tools that help you move around (mobility aids) if they are needed. These include: Canes. Walkers. Scooters. Crutches. Turn on the lights when you go into a dark area. Replace any light bulbs as soon as they burn out. Set up your furniture so you have a clear path. Avoid moving your furniture around. If any of your floors are uneven, fix them. If there are any pets around you, be aware of where they are. Review your medicines with your doctor. Some medicines can make you feel dizzy. This can increase your chance of falling. Ask your doctor what other things that you can do to help prevent falls. This information is not intended to replace advice given to you by your health care provider. Make sure you discuss any questions you have with your health care provider. Document Released: 11/08/2008 Document Revised: 06/20/2015 Document Reviewed: 02/16/2014 Elsevier Interactive Patient Education  2017 [employer].

## 2022-03-12 NOTE — Progress Notes (Signed)
I connected with  [name] on 03/12/22 by a audio enabled telemedicine application and verified that I am speaking with the correct person using two identifiers.  Patient Location: Home  Provider Location: Office/Clinic  I discussed the limitations of evaluation and management by telemedicine. The patient expressed understanding and agreed to proceed.  Subjective:   Brett Boone is a 77 y.o. male who presents for Medicare Annual/Subsequent preventive examination.  Review of Systems     Cardiac Risk Factors include: advanced age (>11mn, >>57women);male gender     Objective:    There were no vitals filed for this visit. There is no height or weight on file to calculate BMI.     03/12/2022    2:04 PM 12/12/2021    1:40 PM 11/14/2021    2:31 PM 10/13/2021    9:12 AM 10/03/2021    1:19 PM 08/25/2021    8:37 AM 07/09/2021    1:47 PM  Advanced Directives  Does Patient Have a Medical Advance Directive? No No No No No No No  Would patient like information on creating a medical advance directive? No - Patient declined  No - Patient declined No - Patient declined No - Patient declined Yes (MAU/Ambulatory/Procedural Areas - Information given) No - Patient declined    Current Medications (verified) Outpatient Encounter Medications as of 03/12/2022  Medication Sig   acetaminophen (TYLENOL) 325 MG tablet Take 650 mg by mouth every 6 (six) hours as needed.   Calcium Carb-Cholecalciferol 600-400 MG-UNIT TABS Take by mouth.   Darbepoetin Alfa (ARANESP) 25 MCG/0.42ML SOSY injection Inject 25 mcg into the skin.   ibuprofen (ADVIL) 200 MG tablet Take 200 mg by mouth every 6 (six) hours as needed.   mupirocin ointment (BACTROBAN) 2 % Apply 1 Application topically 2 (two) times daily.   tadalafil (CIALIS) 5 MG tablet TAKE ONE TABLET BY MOUTH DAILY   Epoetin Alfa-epbx (RETACRIT IJ)  (Patient not taking: Reported on 03/12/2022)   meloxicam (MOBIC) 15 MG tablet TAKE 1 TABLET (15 MG  TOTAL) BY MOUTH DAILY. (Patient not taking: Reported on 03/12/2022)   Facility-Administered Encounter Medications as of 03/12/2022  Medication   Darbepoetin Alfa (ARANESP) injection 300 mcg   Darbepoetin Alfa (ARANESP) injection 300 mcg    Allergies (verified) Amoxicillin-pot clavulanate   History: Past Medical History:  Diagnosis Date   Anemia    Heart murmur    Hydrocele    MDS (myelodysplastic syndrome) (HCC)    Rotator cuff tear arthropathy of right shoulder 08/01/2020   Venous stasis dermatitis of both lower extremities    Past Surgical History:  Procedure Laterality Date   CATARACT EXTRACTION W/PHACO Right 03/09/2017   Procedure: CATARACT EXTRACTION PHACO AND INTRAOCULAR LENS PLACEMENT ([place] RIGHT;  Surgeon: KEulogio Bear MD;  [place]  Service: Ophthalmology;  Laterality: Right;   CATARACT EXTRACTION W/PHACO Left 08/25/2021   Procedure: CATARACT EXTRACTION PHACO AND INTRAOCULAR LENS PLACEMENT (I[facility]) LEFT;  Surgeon: KEulogio Bear MD;  [place] [facility]  Service: Ophthalmology;  Laterality: Left;   CATARACT EXTRACTION W/PHACO Left 10/13/2021   Procedure: CATARACT EXTRACTION PHACO AND INTRAOCULAR LENS PLACEMENT ([place] LEFT;  Surgeon: KEulogio Bear MD;  [place]  Service: Ophthalmology;  Laterality: Left;  8.12 0:52.0   COLONOSCOPY  2013   normal- cleared for 5 yrs- Dr SBeckey Downing  COLONOSCOPY WITH PROPOFOL N/A 02/17/2017   Procedure: COLONOSCOPY WITH PROPOFOL;  Surgeon: VLin Landsman MD;  Location:  [place];  Service: Endoscopy;  Laterality: N/A;   COLONOSCOPY WITH PROPOFOL N/A 10/12/2019   Procedure: COLONOSCOPY WITH BIOPSY;  Surgeon: Lin Landsman, MD;  Location: [place];  Service: Endoscopy;  Laterality: N/A;   EPIGASTRIC HERNIA REPAIR N/A 07/06/2014   Procedure: HERNIA REPAIR EPIGASTRIC ADULT;  Surgeon: Molly Maduro, MD;  Location: [facility];  Service: General;   Laterality: N/A;   HERNIA REPAIR Bilateral    INSERTION OF MESH N/A 07/06/2014   Procedure: INSERTION OF MESH;  Surgeon: Molly Maduro, MD;  Location: [facility];  Service: General;  Laterality: N/A;   IR RADIOLOGIST EVAL & MGMT  11/07/2019   IR RADIOLOGIST EVAL & MGMT  04/25/2020   IR RADIOLOGIST EVAL & MGMT  12/05/2020   IR RADIOLOGIST EVAL & MGMT  06/30/2021   POLYPECTOMY  02/17/2017   Procedure: POLYPECTOMY;  Surgeon: Lin Landsman, MD;  Location: [place];  Service: Endoscopy;;   POLYPECTOMY N/A 10/12/2019   Procedure: POLYPECTOMY;  Surgeon: Lin Landsman, MD;  Location: [place];  Service: Endoscopy;  Laterality: N/A;   SHOULDER ARTHROSCOPY Left 2008   SINUS EXPLORATION     Family History  Problem Relation Age of Onset   Diabetes Father    Heart disease Father 47   Stroke Mother    Social History   Socioeconomic History   Marital status: Married    Spouse name: Not on file   Number of children: 3   Years of education: Not on file   Highest education level: Bachelor's degree (e.g., BA, AB, BS)  Occupational History   Occupation: Retired  Tobacco Use   Smoking status: Former    Packs/day: 0.25    Years: 50.00    Total pack years: 12.50    Types: Cigarettes    Quit date: 05/03/2018    Years since quitting: 3.8   Smokeless tobacco: Former   Tobacco comments:    pt quit smoking  Vaping Use   Vaping Use: Never used  Substance and Sexual Activity   Alcohol use: Yes    Comment: Occasional -1x/month   Drug use: No   Sexual activity: Yes  Other Topics Concern   Not on file  Social History Narrative   Not on file   Social Determinants of Health   Financial Resource Strain: Low Risk  (03/12/2022)   Overall Financial Resource Strain (CARDIA)    Difficulty of Paying Living Expenses: Not hard at all  Food Insecurity: No Food Insecurity (03/12/2022)   Hunger Vital Sign    Worried About Running Out of Food in the Last Year: Never true    [place] in the Last Year: Never true  Transportation Needs: No Transportation Needs (03/12/2022)   PRAPARE - Hydrologist (Medical): No    Lack of Transportation (Non-Medical): No  Physical Activity: Sufficiently Active (03/12/2022)   Exercise Vital Sign    Days of Exercise per Week: 4 days    Minutes of Exercise per Session: 40 min  Stress: No Stress Concern Present (03/12/2022)   [place]    Feeling of Stress : Not at all  Social Connections: Moderately Integrated (03/12/2022)   Social Connection and Isolation Panel [NHANES]    Frequency of Communication with Friends and Family: More than three times a week    Frequency of Social Gatherings with Friends and Family: More than three times a week  Attends Religious Services: More than 4 times per year    Active Member of Clubs or Organizations: No    Attends Archivist Meetings: Never    Marital Status: Married    Tobacco Counseling Counseling given: Not Answered Tobacco comments: pt quit smoking   Clinical Intake:  Pre-visit preparation completed: Yes  Pain : No/denies pain     Nutritional Risks: None Diabetes: No  How often do you need to have someone help you when you read instructions, pamphlets, or other written materials from your doctor or pharmacy?: 1 - Never  Diabetic?no  Interpreter Needed?: No  Information entered by :: Kirke Shaggy, LPN   Activities of Daily Living    03/12/2022    2:06 PM 10/13/2021    9:07 AM  In your present state of health, do you have any difficulty performing the following activities:  Hearing? 1 0  Vision? 0 0  Difficulty concentrating or making decisions? 0 0  Walking or climbing stairs? 1 0  Dressing or bathing? 0 0  Doing errands, shopping? 0   Preparing Food and eating ? N   Using the Toilet? N   In the past six months, have you accidently leaked urine? N   Do  you have problems with loss of bowel control? N   Managing your Medications? N   Managing your Finances? N   Housekeeping or managing your Housekeeping? N     Patient Care Team: [name], MD as PCP - General (Family Medicine) Sindy Guadeloupe, MD as Consulting Physician (Oncology)  Indicate any recent Medical Services you may have received from other than [facility] providers in the past year (date may be approximate).     Assessment:   This is a routine wellness examination for [place].  Hearing/Vision screen Hearing Screening - Comments:: Wears aids Vision Screening - Comments:: No glasses- Dr.King  Dietary issues and exercise activities discussed: Current Exercise Habits: Home exercise routine, Type of exercise: walking (yard work), Time (Minutes): 40, Frequency (Times/Week): 4, Weekly Exercise (Minutes/Week): 160   Goals Addressed             This Visit's Progress    DIET - EAT MORE FRUITS AND VEGETABLES         Depression Screen    03/12/2022    2:03 PM 12/12/2021   11:17 AM 11/27/2021    3:22 PM 08/26/2021    2:01 PM 07/25/2021    8:24 AM 02/26/2021    3:19 PM 07/23/2020    8:47 AM  PHQ 2/9 Scores  PHQ - 2 Score 0 0 0 6 0 0 0  PHQ- 9 Score 0 0 0 12 2  0    Fall Risk    03/12/2022    2:05 PM 11/27/2021    3:22 PM 08/26/2021    2:01 PM 07/25/2021    8:24 AM 02/26/2021    3:21 PM  Fall Risk   Falls in the past year? 0 0 0 0 1  Number falls in past yr: 0 0 0 0 0  Injury with Fall? 0 0 0 0 1  Risk for fall due to : No Fall Risks No Fall Risks No Fall Risks  No Fall Risks  Follow up Falls prevention discussed;Falls evaluation completed Falls evaluation completed Falls evaluation completed Falls evaluation completed Falls prevention discussed    FALL RISK PREVENTION PERTAINING TO THE HOME:  Any stairs in or around the home? Yes  If so, are there any  without handrails? No  Home free of loose throw rugs in walkways, pet beds, electrical cords, etc? Yes  Adequate  lighting in your home to reduce risk of falls? Yes   ASSISTIVE DEVICES UTILIZED TO PREVENT FALLS:  Life alert? No  Use of a cane, walker or w/c? No  Grab bars in the bathroom? Yes  Shower chair or bench in shower? Yes  Elevated toilet seat or a handicapped toilet? Yes   Cognitive Function:        03/12/2022    2:10 PM 12/14/2016    8:05 AM  6CIT Screen  What Year? 0 points 0 points  What month? 0 points 0 points  What time? 0 points 0 points  Count back from 20 0 points 0 points  Months in reverse 0 points 0 points  Repeat phrase 0 points 0 points  Total Score 0 points 0 points    Immunizations Immunization History  Administered Date(s) Administered   Fluad Quad(high Dose 65+) 11/14/2018, 11/21/2019   Influenza Split 11/25/2021   Influenza, High Dose Seasonal PF 12/14/2016, 12/15/2017   Influenza,inj,Quad PF,6+ Mos 10/22/2015   Moderna Sars-Covid-2 Vaccination 01/04/2020   PFIZER(Purple Top)SARS-COV-2 Vaccination 03/08/2019, 03/29/2019   Pneumococcal Conjugate-13 12/14/2016   Pneumococcal Polysaccharide-23 08/30/2014   Tdap 08/30/2014    TDAP status: Up to date  Flu Vaccine status: Up to date  Pneumococcal vaccine status: Up to date  Covid-19 vaccine status: Completed vaccines  Qualifies for Shingles Vaccine? Yes   Zostavax completed No   Shingrix Completed?: No.    Education has been provided regarding the importance of this vaccine. Patient has been advised to call insurance company to determine out of pocket expense if they have not yet received this vaccine. Advised may also receive vaccine at local pharmacy or Health Dept. Verbalized acceptance and understanding.  Screening Tests Health Maintenance  Topic Date Due   Zoster Vaccines- Shingrix (1 of 2) Never done   COVID-19 Vaccine (4 - 2023-24 season) 09/26/2021   COLONOSCOPY (Pts 45-87yr Insurance coverage will need to be confirmed)  10/12/2022   Medicare Annual Wellness (AWV)  03/13/2023    DTaP/Tdap/Td (2 - Td or Tdap) 08/29/2024   Pneumonia Vaccine 76 Years old  Completed   INFLUENZA VACCINE  Completed   Hepatitis C Screening  Completed   HPV VACCINES  Aged Out    Health Maintenance  Health Maintenance Due  Topic Date Due   Zoster Vaccines- Shingrix (1 of 2) Never done   COVID-19 Vaccine (4 - 2023-24 season) 09/26/2021    Colorectal cancer screening: Type of screening: Colonoscopy. Completed 10/12/19. Repeat every 3 years  Lung Cancer Screening: (Low Dose CT Chest recommended if Age 76-80years, 30 pack-year currently smoking OR have quit w/in 15years.) does not qualify.   Additional Screening:  Hepatitis C Screening: does qualify; Completed 12/31/16  Vision Screening: Recommended annual ophthalmology exams for early detection of glaucoma and other disorders of the eye. Is the patient up to date with their annual eye exam?  Yes  Who is the provider or what is the name of the office in which the patient attends annual eye exams? Dr.King  If pt is not established with a provider, would they like to be referred to a provider to establish care? No .   Dental Screening: Recommended annual dental exams for proper oral hygiene  Community Resource Referral / Chronic Care Management: CRR required this visit?  No   CCM required this visit?  No  Plan:     I have personally reviewed and noted the following in the patient's chart:   Medical and social history Use of alcohol, tobacco or illicit drugs  Current medications and supplements including opioid prescriptions. Patient is not currently taking opioid prescriptions. Functional ability and status Nutritional status Physical activity Advanced directives List of other physicians Hospitalizations, surgeries, and ER visits in previous 12 months Vitals Screenings to include cognitive, depression, and falls Referrals and appointments  In addition, I have reviewed and discussed with patient certain preventive  protocols, quality metrics, and best practice recommendations. A written personalized care plan for preventive services as well as general preventive health recommendations were provided to patient.     Dionisio David, LPN   [id number]   Nurse Notes: none

## 2022-03-13 ENCOUNTER — Ambulatory Visit: Payer: Medicare Other

## 2022-03-13 ENCOUNTER — Encounter: Payer: Self-pay | Admitting: Oncology

## 2022-03-13 ENCOUNTER — Other Ambulatory Visit: Payer: Medicare Other

## 2022-03-13 ENCOUNTER — Inpatient Hospital Stay: Payer: Medicare Other

## 2022-03-13 ENCOUNTER — Ambulatory Visit: Payer: Medicare Other | Admitting: Oncology

## 2022-03-13 ENCOUNTER — Other Ambulatory Visit: Payer: Self-pay

## 2022-03-13 ENCOUNTER — Inpatient Hospital Stay: Payer: Medicare Other | Admitting: Oncology

## 2022-03-13 VITALS — BP 148/81 | HR 63 | Temp 97.6°F | Resp 20 | Wt 261.7 lb

## 2022-03-13 DIAGNOSIS — D649 Anemia, unspecified: Secondary | ICD-10-CM

## 2022-03-13 DIAGNOSIS — D461 Refractory anemia with ring sideroblasts: Secondary | ICD-10-CM

## 2022-03-13 DIAGNOSIS — D4621 Refractory anemia with excess of blasts 1: Secondary | ICD-10-CM

## 2022-03-13 DIAGNOSIS — Z79899 Other long term (current) drug therapy: Secondary | ICD-10-CM

## 2022-03-13 LAB — CBC WITH DIFFERENTIAL/PLATELET
Abs Immature Granulocytes: 0.04 10*3/uL (ref 0.00–0.07)
Basophils Absolute: 0.1 10*3/uL (ref 0.0–0.1)
Basophils Relative: 1 %
Eosinophils Absolute: 0.2 10*3/uL (ref 0.0–0.5)
Eosinophils Relative: 3 %
HCT: 30.2 % — ABNORMAL LOW (ref 39.0–52.0)
Hemoglobin: 9.7 g/dL — ABNORMAL LOW (ref 13.0–17.0)
Immature Granulocytes: 1 %
Lymphocytes Relative: 21 %
Lymphs Abs: 1.3 10*3/uL (ref 0.7–4.0)
MCH: 34.4 pg — ABNORMAL HIGH (ref 26.0–34.0)
MCHC: 32.1 g/dL (ref 30.0–36.0)
MCV: 107.1 fL — ABNORMAL HIGH (ref 80.0–100.0)
Monocytes Absolute: 0.6 10*3/uL (ref 0.1–1.0)
Monocytes Relative: 9 %
Neutro Abs: 4 10*3/uL (ref 1.7–7.7)
Neutrophils Relative %: 65 %
Platelets: 302 10*3/uL (ref 150–400)
RBC: 2.82 MIL/uL — ABNORMAL LOW (ref 4.22–5.81)
RDW: 21.3 % — ABNORMAL HIGH (ref 11.5–15.5)
WBC: 6 10*3/uL (ref 4.0–10.5)
nRBC: 1.2 % — ABNORMAL HIGH (ref 0.0–0.2)

## 2022-03-13 LAB — COMPREHENSIVE METABOLIC PANEL
ALT: 19 U/L (ref 0–44)
AST: 22 U/L (ref 15–41)
Albumin: 4 g/dL (ref 3.5–5.0)
Alkaline Phosphatase: 41 U/L (ref 38–126)
Anion gap: 7 (ref 5–15)
BUN: 16 mg/dL (ref 8–23)
CO2: 25 mmol/L (ref 22–32)
Calcium: 8.6 mg/dL — ABNORMAL LOW (ref 8.9–10.3)
Chloride: 100 mmol/L (ref 98–111)
Creatinine, Ser: 1.04 mg/dL (ref 0.61–1.24)
GFR, Estimated: >60 mL/min (ref >60)
Glucose, Bld: 132 mg/dL — ABNORMAL HIGH (ref 70–99)
Potassium: 4.2 mmol/L (ref 3.5–5.1)
Sodium: 132 mmol/L — ABNORMAL LOW (ref 135–145)
Total Bilirubin: 1.7 mg/dL — ABNORMAL HIGH (ref 0.3–1.2)
Total Protein: 6.5 g/dL (ref 6.5–8.1)

## 2022-03-13 LAB — FERRITIN: Ferritin: 628 ng/mL — ABNORMAL HIGH (ref 24–336)

## 2022-03-13 LAB — IRON AND TIBC
Iron: 176 ug/dL (ref 45–182)
Saturation Ratios: 77 % — ABNORMAL HIGH (ref 17.9–39.5)
TIBC: 230 ug/dL — ABNORMAL LOW (ref 250–450)
UIBC: 54 ug/dL

## 2022-03-13 MED ORDER — DARBEPOETIN ALFA 300 MCG/0.6ML IJ SOSY
600.0000 ug | PREFILLED_SYRINGE | INTRAMUSCULAR | Status: AC
  Administered 2022-03-13: 600 ug via SUBCUTANEOUS
  Filled 2022-03-13: qty 1.2

## 2022-03-13 NOTE — Progress Notes (Signed)
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 Bone marrow biopsy on 09/11/2019 revealed macrocytic anemia and hypercellular bone marrow for age with dyspoietic changes primarily involving the erythroid cell lines associated with abundant ring sideroblasts (> 15%).  There was no increase in blasts. Flow cytometry was negative. Cytogenetics revealed 45,X,-Y [20].  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 IPSS score is low.   Baseline EPO level was 63.3 in August 2021.  Patient received Aranesp for a month in September 2021 and was then switched to [place].  He was again switched to aranesp  As per insurance preference 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   MRI abdomen in September 2021 showed 1.3 cm lesion in the left kidney concerning for renal cell carcinoma for which he follows up with urology    Interval history-when patient's hemoglobin is in the 8 range, he does feel more fatigued.  When it is in the 9 range she does have a better quality of life.  He has not had any significant side effects with our nurse.  ECOG PS- 1 Pain scale- 0   Review of systems- Review of Systems  Constitutional:  Positive for malaise/fatigue. Negative for chills, fever and weight loss.  HENT:   Negative for congestion, ear discharge and nosebleeds.   Eyes:  Negative for blurred vision.  Respiratory:  Negative for cough, hemoptysis, sputum production, shortness of breath and wheezing.   Cardiovascular:  Negative for chest pain, palpitations, orthopnea and claudication.  Gastrointestinal:  Negative for abdominal pain, blood in stool, constipation, diarrhea, heartburn, melena, nausea and vomiting.  Genitourinary:  Negative for dysuria, flank pain, frequency, hematuria and urgency.  Musculoskeletal:  Negative for back pain, joint pain and myalgias.  Skin:  Negative for rash.  Neurological:  Negative for dizziness, tingling, focal weakness, seizures, weakness and headaches.  Endo/Heme/Allergies:  Does not bruise/bleed easily.  Psychiatric/Behavioral:  Negative for depression and suicidal ideas. The patient does not have insomnia.       Allergies  Allergen Reactions   Amoxicillin-Pot Clavulanate Hives and Nausea And Vomiting     Past Medical History:  Diagnosis Date   Anemia    Heart murmur    Hydrocele    MDS (myelodysplastic syndrome) (HCC)    Rotator cuff tear arthropathy of right shoulder 08/01/2020   Venous stasis dermatitis of both lower extremities      Past Surgical History:  Procedure Laterality Date   CATARACT EXTRACTION W/PHACO Right 03/09/2017   Procedure: CATARACT EXTRACTION PHACO AND INTRAOCULAR LENS PLACEMENT ([place]) RIGHT;  Surgeon: Eulogio Bear, MD;  Location: [place];  Service: Ophthalmology;  Laterality: Right;   CATARACT EXTRACTION W/PHACO Left 08/25/2021   Procedure: CATARACT EXTRACTION PHACO AND INTRAOCULAR LENS PLACEMENT (IOC) LEFT;  Surgeon: Eulogio Bear, MD;  Location: [place];  Service: Ophthalmology;  Laterality: Left;   CATARACT EXTRACTION W/PHACO Left 10/13/2021   Procedure: CATARACT EXTRACTION PHACO AND INTRAOCULAR LENS PLACEMENT ([place]) LEFT;  Surgeon: Eulogio Bear, MD;  Location: [place];   Service: Ophthalmology;  Laterality: Left;  8.12 0:52.0   COLONOSCOPY  2013   normal- cleared for 5 yrs- Dr Beckey Downing   COLONOSCOPY WITH PROPOFOL N/A 02/17/2017   Procedure: COLONOSCOPY WITH PROPOFOL;  Surgeon: Lin Landsman, MD;  Location: [place];  Service: Endoscopy;  Laterality: N/A;   COLONOSCOPY WITH PROPOFOL N/A 10/12/2019   Procedure: COLONOSCOPY WITH BIOPSY;  Surgeon: Lin Landsman, MD;  Location: [place];  Service: Endoscopy;  Laterality: N/A;   EPIGASTRIC HERNIA REPAIR N/A 07/06/2014   Procedure: HERNIA REPAIR EPIGASTRIC ADULT;  Surgeon: Molly Maduro, MD;  Location: [facility];  Service: General;  Laterality: N/A;   HERNIA REPAIR Bilateral    INSERTION OF MESH N/A 07/06/2014   Procedure: INSERTION OF MESH;  Surgeon: Molly Maduro, MD;  Location: [facility];  Service: General;  Laterality: N/A;   IR RADIOLOGIST EVAL & MGMT  11/07/2019   IR RADIOLOGIST EVAL & MGMT  04/25/2020   IR RADIOLOGIST EVAL & MGMT  12/05/2020   IR RADIOLOGIST EVAL & MGMT  06/30/2021   POLYPECTOMY  02/17/2017   Procedure: POLYPECTOMY;  Surgeon: Lin Landsman, MD;  Location: [place];  Service: Endoscopy;;   POLYPECTOMY N/A 10/12/2019   Procedure: POLYPECTOMY;  Surgeon: Lin Landsman, MD;  Location: [place];  Service: Endoscopy;  Laterality: N/A;   SHOULDER ARTHROSCOPY Left 2008   SINUS EXPLORATION      Social History   Socioeconomic History   Marital status: Married    Spouse name: Not on file   Number of children: 3   Years of education: Not on file   Highest education level: Bachelor's degree (e.g., BA, AB, BS)  Occupational History   Occupation: Retired  Tobacco Use   Smoking status: Former    Packs/day: 0.25    Years: 50.00    Total pack years: 12.50    Types: Cigarettes    Quit date: 05/03/2018    Years since quitting: 3.8   Smokeless tobacco: Former   Tobacco comments:    pt quit smoking  Vaping Use   Vaping Use: Never  used  Substance and Sexual Activity   Alcohol use: Yes    Comment: Occasional -1x/month   Drug use: No   Sexual activity: Yes  Other Topics Concern   Not on file  Social History Narrative   Not on file   Social Determinants of Health   Financial Resource Strain: Low Risk  (03/12/2022)   Overall Financial Resource Strain (CARDIA)    Difficulty of Paying Living Expenses: Not hard at all  Food Insecurity: No Food Insecurity (03/12/2022)   Hunger Vital Sign    Worried About Running Out of Food in the Last Year: Never true    Carmichaels in the Last Year: Never true  Transportation Needs: No Transportation Needs (03/12/2022)   PRAPARE - Hydrologist (Medical): No    Lack of Transportation (Non-Medical): No  Physical Activity: Sufficiently Active (03/12/2022)   Exercise Vital Sign    Days of Exercise per Week: 4 days    Minutes of Exercise per Session: 40 min  Stress: No Stress Concern Present (03/12/2022)   [place]    Feeling of Stress :  Not at all  Social Connections: Moderately Integrated (03/12/2022)   Social Connection and Isolation Panel [NHANES]    Frequency of Communication with Friends and Family: More than three times a week    Frequency of Social Gatherings with Friends and Family: More than three times a week    Attends Religious Services: More than 4 times per year    Active Member of [employer] or Organizations: No    Attends Archivist Meetings: Never    Marital Status: Married  Human resources officer Violence: Not At Risk (03/12/2022)   Humiliation, Afraid, Rape, and Kick questionnaire    Fear of Current or Ex-Partner: No    Emotionally Abused: No    Physically Abused: No    Sexually Abused: No    Family History  Problem Relation Age of Onset   Diabetes Father    Heart disease Father 50   Stroke Mother      Current Outpatient Medications:    acetaminophen  (TYLENOL) 325 MG tablet, Take 650 mg by mouth every 6 (six) hours as needed., Disp: , Rfl:    Calcium Carb-Cholecalciferol 600-400 MG-UNIT TABS, Take by mouth., Disp: , Rfl:    Darbepoetin Alfa (ARANESP) 25 MCG/0.42ML SOSY injection, Inject 25 mcg into the skin., Disp: , Rfl:    ibuprofen (ADVIL) 200 MG tablet, Take 200 mg by mouth every 6 (six) hours as needed., Disp: , Rfl:    mupirocin ointment (BACTROBAN) 2 %, Apply 1 Application topically 2 (two) times daily., Disp: 22 g, Rfl: 0   tadalafil (CIALIS) 5 MG tablet, TAKE ONE TABLET BY MOUTH DAILY, Disp: 30 tablet, Rfl: 11   Epoetin Alfa-epbx (RETACRIT IJ), , Disp: , Rfl:    meloxicam (MOBIC) 15 MG tablet, TAKE 1 TABLET (15 MG TOTAL) BY MOUTH DAILY. (Patient not taking: Reported on 03/12/2022), Disp: 30 tablet, Rfl: 1 No current facility-administered medications for this visit.  Facility-Administered Medications Ordered in Other Visits:    Darbepoetin Alfa (ARANESP) injection 300 mcg, 300 mcg, Subcutaneous, Weekly, Sindy Guadeloupe, MD, 300 mcg at 12/26/21 1013   Darbepoetin Alfa (ARANESP) injection 300 mcg, 300 mcg, Subcutaneous, Weekly, Sindy Guadeloupe, MD, 300 mcg at 02/13/22 1021   Darbepoetin Alfa (ARANESP) injection 600 mcg, 600 mcg, Subcutaneous, Q14 Days, Sindy Guadeloupe, MD, 600 mcg at 03/13/22 1032  Physical exam:  Vitals:   03/13/22 0932  BP: (!) 148/81  Pulse: 63  Resp: 20  Temp: 97.6 F (36.4 C)  SpO2: 100%  Weight: 261 lb 11.2 oz (118.7 kg)   Physical Exam Constitutional:      General: He is not in acute distress. Cardiovascular:     Rate and Rhythm: Normal rate and regular rhythm.     Heart sounds: Normal heart sounds.  Pulmonary:     Effort: Pulmonary effort is normal.     Breath sounds: Normal breath sounds.  Skin:    General: Skin is warm and dry.  Neurological:     Mental Status: He is alert and oriented to person, place, and time.         Latest Ref Rng & Units 03/13/2022    9:16 AM  CMP  Glucose 70 - 99  mg/dL 132   BUN 8 - 23 mg/dL 16   Creatinine 0.61 - 1.24 mg/dL 1.04   Sodium 135 - 145 mmol/L 132   Potassium 3.5 - 5.1 mmol/L 4.2   Chloride 98 - 111 mmol/L 100   CO2 22 - 32 mmol/L 25  Calcium 8.9 - 10.3 mg/dL 8.6   Total Protein 6.5 - 8.1 g/dL 6.5   Total Bilirubin 0.3 - 1.2 mg/dL 1.7   Alkaline Phos 38 - 126 U/L 41   AST 15 - 41 U/L 22   ALT 0 - 44 U/L 19       Latest Ref Rng & Units 03/13/2022    9:16 AM  CBC  WBC 4.0 - 10.5 K/uL 6.0   Hemoglobin 13.0 - 17.0 g/dL 9.7   Hematocrit 39.0 - 52.0 % 30.2   Platelets 150 - 400 K/uL 302     Assessment and plan- Patient is a 77 y.o. male with low risk MDS with SF 3 B1 mutation.  He is currently on Aranesp and this is a routine follow-up visit  Patient's hemoglobin has been mostly stable between 9-9.8 over the last 1 year.  Occasionally his hemoglobin dips down to the eights but it has not stayed in that range.  He does have a good quality of life and his hemoglobin is in the 9 range.  My plan is to continue aranesp at this time but I will switch him from 300 mcg weekly dosing to 600 mcg every 2 weeks.  Should his hemoglobin dropped down to the 80s despite using Aranesp we will switch him to Luspatercept.  I will see him back in 3 months with labs   Visit Diagnosis 1. Erythropoietin (EPO) stimulating agent anemia management patient   2. Refractory anemia with ring sideroblasts (HCC)      Dr. Randa Evens, MD, MPH [facility] at [facility] [id number] 03/13/2022 1:12 PM

## 2022-03-17 DIAGNOSIS — R262 Difficulty in walking, not elsewhere classified: Secondary | ICD-10-CM | POA: Diagnosis not present

## 2022-03-17 DIAGNOSIS — M5432 Sciatica, left side: Secondary | ICD-10-CM | POA: Diagnosis not present

## 2022-03-24 DIAGNOSIS — R262 Difficulty in walking, not elsewhere classified: Secondary | ICD-10-CM | POA: Diagnosis not present

## 2022-03-24 DIAGNOSIS — M5432 Sciatica, left side: Secondary | ICD-10-CM | POA: Diagnosis not present

## 2022-03-27 ENCOUNTER — Inpatient Hospital Stay: Payer: Medicare Other | Attending: Nurse Practitioner

## 2022-03-27 ENCOUNTER — Other Ambulatory Visit: Payer: Self-pay | Admitting: *Deleted

## 2022-03-27 ENCOUNTER — Inpatient Hospital Stay: Payer: Medicare Other

## 2022-03-27 VITALS — BP 131/60 | HR 69 | Resp 17

## 2022-03-27 DIAGNOSIS — D461 Refractory anemia with ring sideroblasts: Secondary | ICD-10-CM

## 2022-03-27 DIAGNOSIS — Z79899 Other long term (current) drug therapy: Secondary | ICD-10-CM

## 2022-03-27 LAB — CBC (CANCER CENTER ONLY)
HCT: 27.8 % — ABNORMAL LOW (ref 39.0–52.0)
Hemoglobin: 8.7 g/dL — ABNORMAL LOW (ref 13.0–17.0)
MCH: 34.1 pg — ABNORMAL HIGH (ref 26.0–34.0)
MCHC: 31.3 g/dL (ref 30.0–36.0)
MCV: 109 fL — ABNORMAL HIGH (ref 80.0–100.0)
Platelet Count: 255 10*3/uL (ref 150–400)
RBC: 2.55 MIL/uL — ABNORMAL LOW (ref 4.22–5.81)
RDW: 21.1 % — ABNORMAL HIGH (ref 11.5–15.5)
WBC Count: 4.9 10*3/uL (ref 4.0–10.5)
nRBC: 0.6 % — ABNORMAL HIGH (ref 0.0–0.2)

## 2022-03-27 MED ORDER — DARBEPOETIN ALFA 500 MCG/ML IJ SOSY
500.0000 ug | PREFILLED_SYRINGE | Freq: Once | INTRAMUSCULAR | Status: AC
  Administered 2022-03-27: 500 ug via SUBCUTANEOUS
  Filled 2022-03-27: qty 1

## 2022-03-27 MED ORDER — DARBEPOETIN ALFA 100 MCG/0.5ML IJ SOSY
100.0000 ug | PREFILLED_SYRINGE | Freq: Once | INTRAMUSCULAR | Status: AC
  Administered 2022-03-27: 100 ug via SUBCUTANEOUS
  Filled 2022-03-27: qty 0.5

## 2022-03-27 MED ORDER — DARBEPOETIN ALFA 500 MCG/ML IJ SOSY: 600.0000 ug | PREFILLED_SYRINGE | INTRAMUSCULAR | Status: DC

## 2022-03-31 ENCOUNTER — Ambulatory Visit
Admission: RE | Admit: 2022-03-31 | Discharge: 2022-03-31 | Disposition: A | Discharge status: Home / Self Care | Payer: Medicare Other | Source: Ambulatory Visit | Attending: Acute Care | Admitting: Acute Care

## 2022-03-31 DIAGNOSIS — Z87891 Personal history of nicotine dependence: Secondary | ICD-10-CM | POA: Diagnosis not present

## 2022-04-02 ENCOUNTER — Other Ambulatory Visit: Payer: Self-pay

## 2022-04-02 DIAGNOSIS — Z87891 Personal history of nicotine dependence: Secondary | ICD-10-CM

## 2022-04-06 DIAGNOSIS — M5432 Sciatica, left side: Secondary | ICD-10-CM | POA: Diagnosis not present

## 2022-04-06 DIAGNOSIS — R262 Difficulty in walking, not elsewhere classified: Secondary | ICD-10-CM | POA: Diagnosis not present

## 2022-04-10 ENCOUNTER — Other Ambulatory Visit: Payer: Self-pay

## 2022-04-10 ENCOUNTER — Inpatient Hospital Stay: Payer: Medicare Other

## 2022-04-10 VITALS — BP 128/72 | HR 83

## 2022-04-10 DIAGNOSIS — D461 Refractory anemia with ring sideroblasts: Secondary | ICD-10-CM

## 2022-04-10 LAB — CBC WITH DIFFERENTIAL/PLATELET
Abs Immature Granulocytes: 0.01 10*3/uL (ref 0.00–0.07)
Basophils Absolute: 0.1 10*3/uL (ref 0.0–0.1)
Basophils Relative: 1 %
Eosinophils Absolute: 0.1 10*3/uL (ref 0.0–0.5)
Eosinophils Relative: 3 %
HCT: 28.4 % — ABNORMAL LOW (ref 39.0–52.0)
Hemoglobin: 9 g/dL — ABNORMAL LOW (ref 13.0–17.0)
Immature Granulocytes: 0 %
Lymphocytes Relative: 21 %
Lymphs Abs: 1.1 10*3/uL (ref 0.7–4.0)
MCH: 34.4 pg — ABNORMAL HIGH (ref 26.0–34.0)
MCHC: 31.7 g/dL (ref 30.0–36.0)
MCV: 108.4 fL — ABNORMAL HIGH (ref 80.0–100.0)
Monocytes Absolute: 0.5 10*3/uL (ref 0.1–1.0)
Monocytes Relative: 9 %
Neutro Abs: 3.4 10*3/uL (ref 1.7–7.7)
Neutrophils Relative %: 66 %
Platelets: 259 10*3/uL (ref 150–400)
RBC: 2.62 MIL/uL — ABNORMAL LOW (ref 4.22–5.81)
RDW: 20.4 % — ABNORMAL HIGH (ref 11.5–15.5)
WBC: 5.1 10*3/uL (ref 4.0–10.5)
nRBC: 0 % (ref 0.0–0.2)

## 2022-04-10 MED ORDER — DARBEPOETIN ALFA 300 MCG/0.6ML IJ SOSY
600.0000 ug | PREFILLED_SYRINGE | INTRAMUSCULAR | Status: DC
  Administered 2022-04-10: 600 ug via SUBCUTANEOUS
  Filled 2022-04-10: qty 1.2

## 2022-04-24 ENCOUNTER — Inpatient Hospital Stay: Payer: Medicare Other

## 2022-04-24 ENCOUNTER — Other Ambulatory Visit: Payer: Self-pay | Admitting: *Deleted

## 2022-04-24 VITALS — BP 149/61

## 2022-04-24 DIAGNOSIS — D461 Refractory anemia with ring sideroblasts: Secondary | ICD-10-CM | POA: Diagnosis not present

## 2022-04-24 LAB — CBC WITH DIFFERENTIAL (CANCER CENTER ONLY)
Abs Immature Granulocytes: 0.01 10*3/uL (ref 0.00–0.07)
Basophils Absolute: 0.1 10*3/uL (ref 0.0–0.1)
Basophils Relative: 1 %
Eosinophils Absolute: 0.1 10*3/uL (ref 0.0–0.5)
Eosinophils Relative: 1 %
HCT: 28.7 % — ABNORMAL LOW (ref 39.0–52.0)
Hemoglobin: 9.3 g/dL — ABNORMAL LOW (ref 13.0–17.0)
Immature Granulocytes: 0 %
Lymphocytes Relative: 22 %
Lymphs Abs: 1.3 10*3/uL (ref 0.7–4.0)
MCH: 34.3 pg — ABNORMAL HIGH (ref 26.0–34.0)
MCHC: 32.4 g/dL (ref 30.0–36.0)
MCV: 105.9 fL — ABNORMAL HIGH (ref 80.0–100.0)
Monocytes Absolute: 0.5 10*3/uL (ref 0.1–1.0)
Monocytes Relative: 9 %
Neutro Abs: 4.1 10*3/uL (ref 1.7–7.7)
Neutrophils Relative %: 67 %
Platelet Count: 315 10*3/uL (ref 150–400)
RBC: 2.71 MIL/uL — ABNORMAL LOW (ref 4.22–5.81)
RDW: 20.5 % — ABNORMAL HIGH (ref 11.5–15.5)
WBC Count: 6.1 10*3/uL (ref 4.0–10.5)
nRBC: 0.3 % — ABNORMAL HIGH (ref 0.0–0.2)

## 2022-04-24 MED ORDER — DARBEPOETIN ALFA 300 MCG/0.6ML IJ SOSY
600.0000 ug | PREFILLED_SYRINGE | INTRAMUSCULAR | Status: DC
  Administered 2022-04-24: 600 ug via SUBCUTANEOUS
  Filled 2022-04-24: qty 1.2

## 2022-05-07 ENCOUNTER — Other Ambulatory Visit: Payer: Self-pay | Admitting: *Deleted

## 2022-05-07 DIAGNOSIS — D461 Refractory anemia with ring sideroblasts: Secondary | ICD-10-CM

## 2022-05-08 ENCOUNTER — Inpatient Hospital Stay: Payer: Medicare Other

## 2022-05-08 ENCOUNTER — Inpatient Hospital Stay: Payer: Medicare Other | Attending: Nurse Practitioner

## 2022-05-08 VITALS — BP 136/80

## 2022-05-08 DIAGNOSIS — D461 Refractory anemia with ring sideroblasts: Secondary | ICD-10-CM

## 2022-05-08 LAB — COMPREHENSIVE METABOLIC PANEL
ALT: 19 U/L (ref 0–44)
AST: 25 U/L (ref 15–41)
Albumin: 4 g/dL (ref 3.5–5.0)
Alkaline Phosphatase: 44 U/L (ref 38–126)
Anion gap: 6 (ref 5–15)
BUN: 25 mg/dL — ABNORMAL HIGH (ref 8–23)
CO2: 24 mmol/L (ref 22–32)
Calcium: 8.6 mg/dL — ABNORMAL LOW (ref 8.9–10.3)
Chloride: 106 mmol/L (ref 98–111)
Creatinine, Ser: 1.16 mg/dL (ref 0.61–1.24)
GFR, Estimated: >60 mL/min (ref >60)
Glucose, Bld: 113 mg/dL — ABNORMAL HIGH (ref 70–99)
Potassium: 4.2 mmol/L (ref 3.5–5.1)
Sodium: 136 mmol/L (ref 135–145)
Total Bilirubin: 1.6 mg/dL — ABNORMAL HIGH (ref 0.3–1.2)
Total Protein: 6.6 g/dL (ref 6.5–8.1)

## 2022-05-08 LAB — CBC WITH DIFFERENTIAL/PLATELET
Abs Immature Granulocytes: 0.01 10*3/uL (ref 0.00–0.07)
Basophils Absolute: 0 10*3/uL (ref 0.0–0.1)
Basophils Relative: 1 %
Eosinophils Absolute: 0.1 10*3/uL (ref 0.0–0.5)
Eosinophils Relative: 1 %
HCT: 28.4 % — ABNORMAL LOW (ref 39.0–52.0)
Hemoglobin: 9.2 g/dL — ABNORMAL LOW (ref 13.0–17.0)
Immature Granulocytes: 0 %
Lymphocytes Relative: 22 %
Lymphs Abs: 1.3 10*3/uL (ref 0.7–4.0)
MCH: 33.9 pg (ref 26.0–34.0)
MCHC: 32.4 g/dL (ref 30.0–36.0)
MCV: 104.8 fL — ABNORMAL HIGH (ref 80.0–100.0)
Monocytes Absolute: 0.5 10*3/uL (ref 0.1–1.0)
Monocytes Relative: 8 %
Neutro Abs: 4.1 10*3/uL (ref 1.7–7.7)
Neutrophils Relative %: 68 %
Platelets: 304 10*3/uL (ref 150–400)
RBC: 2.71 MIL/uL — ABNORMAL LOW (ref 4.22–5.81)
RDW: 21.2 % — ABNORMAL HIGH (ref 11.5–15.5)
WBC: 6 10*3/uL (ref 4.0–10.5)
nRBC: 0.5 % — ABNORMAL HIGH (ref 0.0–0.2)

## 2022-05-08 LAB — IRON AND TIBC
Iron: 201 ug/dL — ABNORMAL HIGH (ref 45–182)
Saturation Ratios: 87 % — ABNORMAL HIGH (ref 17.9–39.5)
TIBC: 232 ug/dL — ABNORMAL LOW (ref 250–450)
UIBC: 31 ug/dL

## 2022-05-08 LAB — FERRITIN: Ferritin: 914 ng/mL — ABNORMAL HIGH (ref 24–336)

## 2022-05-08 MED ORDER — DARBEPOETIN ALFA 300 MCG/0.6ML IJ SOSY
600.0000 ug | PREFILLED_SYRINGE | INTRAMUSCULAR | Status: DC
  Administered 2022-05-08: 600 ug via SUBCUTANEOUS
  Filled 2022-05-08: qty 1.2

## 2022-05-22 ENCOUNTER — Inpatient Hospital Stay: Payer: Medicare Other

## 2022-05-22 VITALS — BP 136/65 | HR 76

## 2022-05-22 DIAGNOSIS — D461 Refractory anemia with ring sideroblasts: Secondary | ICD-10-CM

## 2022-05-22 LAB — CBC WITH DIFFERENTIAL/PLATELET
Abs Immature Granulocytes: 0.02 10*3/uL (ref 0.00–0.07)
Basophils Absolute: 0.1 10*3/uL (ref 0.0–0.1)
Basophils Relative: 1 %
Eosinophils Absolute: 0.1 10*3/uL (ref 0.0–0.5)
Eosinophils Relative: 1 %
HCT: 27.8 % — ABNORMAL LOW (ref 39.0–52.0)
Hemoglobin: 9.2 g/dL — ABNORMAL LOW (ref 13.0–17.0)
Immature Granulocytes: 0 %
Lymphocytes Relative: 18 %
Lymphs Abs: 1.4 10*3/uL (ref 0.7–4.0)
MCH: 34.6 pg — ABNORMAL HIGH (ref 26.0–34.0)
MCHC: 33.1 g/dL (ref 30.0–36.0)
MCV: 104.5 fL — ABNORMAL HIGH (ref 80.0–100.0)
Monocytes Absolute: 0.7 10*3/uL (ref 0.1–1.0)
Monocytes Relative: 9 %
Neutro Abs: 5.4 10*3/uL (ref 1.7–7.7)
Neutrophils Relative %: 71 %
Platelets: 291 10*3/uL (ref 150–400)
RBC: 2.66 MIL/uL — ABNORMAL LOW (ref 4.22–5.81)
RDW: 21.9 % — ABNORMAL HIGH (ref 11.5–15.5)
WBC: 7.6 10*3/uL (ref 4.0–10.5)
nRBC: 0.5 % — ABNORMAL HIGH (ref 0.0–0.2)

## 2022-05-22 MED ORDER — DARBEPOETIN ALFA 300 MCG/0.6ML IJ SOSY
600.0000 ug | PREFILLED_SYRINGE | INTRAMUSCULAR | Status: DC
  Administered 2022-05-22: 600 ug via SUBCUTANEOUS
  Filled 2022-05-22: qty 1.2

## 2022-06-03 ENCOUNTER — Encounter: Payer: Self-pay | Admitting: Interventional Radiology

## 2022-06-03 DIAGNOSIS — R160 Hepatomegaly, not elsewhere classified: Secondary | ICD-10-CM

## 2022-06-04 ENCOUNTER — Other Ambulatory Visit: Payer: Self-pay | Admitting: *Deleted

## 2022-06-04 DIAGNOSIS — D461 Refractory anemia with ring sideroblasts: Secondary | ICD-10-CM

## 2022-06-05 ENCOUNTER — Inpatient Hospital Stay: Payer: Medicare Other

## 2022-06-05 ENCOUNTER — Inpatient Hospital Stay: Payer: Medicare Other | Attending: Nurse Practitioner

## 2022-06-05 VITALS — BP 131/80

## 2022-06-05 DIAGNOSIS — D461 Refractory anemia with ring sideroblasts: Secondary | ICD-10-CM

## 2022-06-05 LAB — HEMOGLOBIN AND HEMATOCRIT, BLOOD
HCT: 28 % — ABNORMAL LOW (ref 39.0–52.0)
Hemoglobin: 9 g/dL — ABNORMAL LOW (ref 13.0–17.0)

## 2022-06-05 MED ORDER — DARBEPOETIN ALFA 300 MCG/0.6ML IJ SOSY
600.0000 ug | PREFILLED_SYRINGE | INTRAMUSCULAR | Status: DC
  Administered 2022-06-05: 600 ug via SUBCUTANEOUS
  Filled 2022-06-05: qty 1.2

## 2022-06-12 ENCOUNTER — Inpatient Hospital Stay: Payer: Medicare Other

## 2022-06-12 ENCOUNTER — Inpatient Hospital Stay: Payer: Medicare Other | Admitting: Oncology

## 2022-06-12 ENCOUNTER — Encounter: Payer: Self-pay | Admitting: Oncology

## 2022-06-12 VITALS — BP 133/65 | HR 88 | Temp 97.6°F | Resp 20 | Wt 264.1 lb

## 2022-06-12 DIAGNOSIS — D461 Refractory anemia with ring sideroblasts: Secondary | ICD-10-CM | POA: Diagnosis not present

## 2022-06-12 DIAGNOSIS — R7989 Other specified abnormal findings of blood chemistry: Secondary | ICD-10-CM | POA: Diagnosis not present

## 2022-06-12 DIAGNOSIS — Z79899 Other long term (current) drug therapy: Secondary | ICD-10-CM

## 2022-06-12 LAB — CBC WITH DIFFERENTIAL/PLATELET
Abs Immature Granulocytes: 0.08 10*3/uL — ABNORMAL HIGH (ref 0.00–0.07)
Basophils Absolute: 0 10*3/uL (ref 0.0–0.1)
Basophils Relative: 1 %
Eosinophils Absolute: 0.1 10*3/uL (ref 0.0–0.5)
Eosinophils Relative: 2 %
HCT: 29 % — ABNORMAL LOW (ref 39.0–52.0)
Hemoglobin: 9.3 g/dL — ABNORMAL LOW (ref 13.0–17.0)
Immature Granulocytes: 1 %
Lymphocytes Relative: 19 %
Lymphs Abs: 1.3 10*3/uL (ref 0.7–4.0)
MCH: 34.2 pg — ABNORMAL HIGH (ref 26.0–34.0)
MCHC: 32.1 g/dL (ref 30.0–36.0)
MCV: 106.6 fL — ABNORMAL HIGH (ref 80.0–100.0)
Monocytes Absolute: 0.6 10*3/uL (ref 0.1–1.0)
Monocytes Relative: 9 %
Neutro Abs: 4.7 10*3/uL (ref 1.7–7.7)
Neutrophils Relative %: 68 %
Platelets: 312 10*3/uL (ref 150–400)
RBC: 2.72 MIL/uL — ABNORMAL LOW (ref 4.22–5.81)
RDW: 23.3 % — ABNORMAL HIGH (ref 11.5–15.5)
WBC: 6.8 10*3/uL (ref 4.0–10.5)
nRBC: 1.3 % — ABNORMAL HIGH (ref 0.0–0.2)

## 2022-06-12 LAB — IRON AND TIBC
Iron: 107 ug/dL (ref 45–182)
Saturation Ratios: 46 % — ABNORMAL HIGH (ref 17.9–39.5)
TIBC: 231 ug/dL — ABNORMAL LOW (ref 250–450)
UIBC: 124 ug/dL

## 2022-06-12 LAB — COMPREHENSIVE METABOLIC PANEL
ALT: 15 U/L (ref 0–44)
AST: 24 U/L (ref 15–41)
Albumin: 4 g/dL (ref 3.5–5.0)
Alkaline Phosphatase: 41 U/L (ref 38–126)
Anion gap: 8 (ref 5–15)
BUN: 14 mg/dL (ref 8–23)
CO2: 24 mmol/L (ref 22–32)
Calcium: 8.8 mg/dL — ABNORMAL LOW (ref 8.9–10.3)
Chloride: 104 mmol/L (ref 98–111)
Creatinine, Ser: 1.12 mg/dL (ref 0.61–1.24)
GFR, Estimated: >60 mL/min (ref >60)
Glucose, Bld: 122 mg/dL — ABNORMAL HIGH (ref 70–99)
Potassium: 4.7 mmol/L (ref 3.5–5.1)
Sodium: 136 mmol/L (ref 135–145)
Total Bilirubin: 1.5 mg/dL — ABNORMAL HIGH (ref 0.3–1.2)
Total Protein: 6.6 g/dL (ref 6.5–8.1)

## 2022-06-12 LAB — FERRITIN: Ferritin: 640 ng/mL — ABNORMAL HIGH (ref 24–336)

## 2022-06-12 MED ORDER — DARBEPOETIN ALFA 300 MCG/0.6ML IJ SOSY
600.0000 ug | PREFILLED_SYRINGE | INTRAMUSCULAR | Status: AC
  Administered 2022-06-12: 600 ug via SUBCUTANEOUS
  Filled 2022-06-12: qty 1.2

## 2022-06-12 NOTE — Progress Notes (Signed)
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 Bone marrow biopsy on 09/11/2019 revealed macrocytic anemia and hypercellular bone marrow for age with dyspoietic changes primarily involving the erythroid cell lines associated with abundant ring sideroblasts (> 15%).  There was no increase in blasts. Flow cytometry was negative. Cytogenetics revealed 45,X,-Y [20].  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 IPSS score is low.   Baseline EPO level was 63.3 in August 2021.  Patient received Aranesp for a month in September 2021 and was then switched to Retacrit.  He was again switched to aranesp   As per insurance preference patient found to have elevated ferritin and when we was under the care of Dr. 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   MRI abdomen in September 2021 showed 1.3 cm lesion in the left kidney concerning for renal cell carcinoma for which he follows up with urology    Interval history-overall he is doing well.  Denies any specific complaints at this time.  He does drink a shot or 2 whiskey 1 or 2 times a week.  He has chronic bilateral knee pain  ECOG PS- 1 Pain scale- 0   Review of systems- Review of Systems  Constitutional:  Positive for malaise/fatigue. Negative for chills, fever and weight loss.  HENT:  Negative for congestion, ear discharge and  nosebleeds.   Eyes:  Negative for blurred vision.  Respiratory:  Negative for cough, hemoptysis, sputum production, shortness of breath and wheezing.   Cardiovascular:  Negative for chest pain, palpitations, orthopnea and claudication.  Gastrointestinal:  Negative for abdominal pain, blood in stool, constipation, diarrhea, heartburn, melena, nausea and vomiting.  Genitourinary:  Negative for dysuria, flank pain, frequency, hematuria and urgency.  Musculoskeletal:  Positive for joint pain. Negative for back pain and myalgias.  Skin:  Negative for rash.  Neurological:  Negative for dizziness, tingling, focal weakness, seizures, weakness and headaches.  Endo/Heme/Allergies:  Does not bruise/bleed easily.  Psychiatric/Behavioral:  Negative for depression and suicidal ideas. The patient does not have insomnia.       Allergies  Allergen Reactions   Amoxicillin-Pot Clavulanate Hives and Nausea And Vomiting     Past Medical History:  Diagnosis Date   Anemia    Heart murmur    Hydrocele    MDS (myelodysplastic syndrome) (HCC)    Rotator cuff tear arthropathy of right shoulder 08/01/2020   Venous stasis dermatitis of both lower extremities      Past Surgical History:  Procedure Laterality Date   CATARACT EXTRACTION W/PHACO Right 03/09/2017   Procedure: CATARACT EXTRACTION PHACO AND INTRAOCULAR LENS PLACEMENT (IOC) RIGHT;  Surgeon: Nevada Crane, MD;  Location[facility];  Service: Ophthalmology;  Laterality: Right;   CATARACT EXTRACTION W/PHACO Left 08/25/2021   Procedure: CATARACT EXTRACTION PHACO AND INTRAOCULAR LENS PLACEMENT (IOC) LEFT;  Surgeon: Nevada Crane, MD;  Location: [facility];  Service: Ophthalmology;  Laterality: Left;  CATARACT EXTRACTION W/PHACO Left 10/13/2021   Procedure: CATARACT EXTRACTION PHACO AND INTRAOCULAR LENS PLACEMENT (IOC) LEFT;  Surgeon: Nevada Crane, MD;  Location: [facility];  Service: Ophthalmology;  Laterality:  Left;  8.12 0:52.0   COLONOSCOPY  2013   normal- cleared for 5 yrs- Dr Theodoro Parma   COLONOSCOPY WITH PROPOFOL N/A 02/17/2017   Procedure: COLONOSCOPY WITH PROPOFOL;  Surgeon: Toney Reil, MD;  Location: [facility];  Service: Endoscopy;  Laterality: N/A;   COLONOSCOPY WITH PROPOFOL N/A 10/12/2019   Procedure: COLONOSCOPY WITH BIOPSY;  Surgeon: Toney Reil, MD;  Location: [facility];  Service: Endoscopy;  Laterality: N/A;   EPIGASTRIC HERNIA REPAIR N/A 07/06/2014   Procedure: HERNIA REPAIR EPIGASTRIC ADULT;  Surgeon: Duwaine Maxin, MD;  Location: [facility];  Service: General;  Laterality: N/A;   HERNIA REPAIR Bilateral    INSERTION OF MESH N/A 07/06/2014   Procedure: INSERTION OF MESH;  Surgeon: Duwaine Maxin, MD;  Location: [facility];  Service: General;  Laterality: N/A;   IR RADIOLOGIST EVAL & MGMT  11/07/2019   IR RADIOLOGIST EVAL & MGMT  04/25/2020   IR RADIOLOGIST EVAL & MGMT  12/05/2020   IR RADIOLOGIST EVAL & MGMT  06/30/2021   POLYPECTOMY  02/17/2017   Procedure: POLYPECTOMY;  Surgeon: Toney Reil, MD;  Location: [facility];  Service: Endoscopy;;   POLYPECTOMY N/A 10/12/2019   Procedure: POLYPECTOMY;  Surgeon: Toney Reil, MD;  Location: [facility];  Service: Endoscopy;  Laterality: N/A;   SHOULDER ARTHROSCOPY Left 2008   SINUS EXPLORATION      Social History   Socioeconomic History   Marital status: Married    Spouse name: Not on file   Number of children: 3   Years of education: Not on file   Highest education level: Bachelor's degree (e.g., BA, AB, BS)  Occupational History   Occupation: Retired  Tobacco Use   Smoking status: Former    Packs/day: 0.25    Years: 50.00    Additional pack years: 0.00    Total pack years: 12.50    Types: Cigarettes    Quit date: 05/03/2018    Years since quitting: 4.1   Smokeless tobacco: Former   Tobacco comments:    pt quit smoking  Vaping Use   Vaping Use: Never used   Substance and Sexual Activity   Alcohol use: Yes    Comment: Occasional -1x/month   Drug use: No   Sexual activity: Yes  Other Topics Concern   Not on file  Social History Narrative   Not on file   Social Determinants of Health   Financial Resource Strain: Low Risk  (03/12/2022)   Overall Financial Resource Strain (CARDIA)    Difficulty of Paying Living Expenses: Not hard at all  Food Insecurity: No Food Insecurity (03/12/2022)   Hunger Vital Sign    Worried About Running Out of Food in the Last Year: Never true    Ran Out of Food in the Last Year: Never true  Transportation Needs: No Transportation Needs (03/12/2022)   PRAPARE - Administrator, Civil Service (Medical): No    Lack of Transportation (Non-Medical): No  Physical Activity: Sufficiently Active (03/12/2022)   Exercise Vital Sign    Days of Exercise per Week: 4 days    Minutes of Exercise per Session: 40 min  Stress: No Stress Concern Present (03/12/2022)   [employer] of Occupational Health - Occupational Stress Questionnaire    Feeling  of Stress : Not at all  Social Connections: Moderately Integrated (03/12/2022)   Social Connection and Isolation Panel [NHANES]    Frequency of Communication with Friends and Family: More than three times a week    Frequency of Social Gatherings with Friends and Family: More than three times a week    Attends Religious Services: More than 4 times per year    Active Member of [employer] or Organizations: No    Attends Banker Meetings: Never    Marital Status: Married  Catering manager Violence: Not At Risk (03/12/2022)   Humiliation, Afraid, Rape, and Kick questionnaire    Fear of Current or Ex-Partner: No    Emotionally Abused: No    Physically Abused: No    Sexually Abused: No    Family History  Problem Relation Age of Onset   Diabetes Father    Heart disease Father 75   Stroke Mother      Current Outpatient Medications:    acetaminophen (TYLENOL)  325 MG tablet, Take 650 mg by mouth every 6 (six) hours as needed., Disp: , Rfl:    Calcium Carb-Cholecalciferol 600-400 MG-UNIT TABS, Take by mouth., Disp: , Rfl:    Darbepoetin Alfa (ARANESP) 25 MCG/0.42ML SOSY injection, Inject 25 mcg into the skin., Disp: , Rfl:    ibuprofen (ADVIL) 200 MG tablet, Take 200 mg by mouth every 6 (six) hours as needed., Disp: , Rfl:    mupirocin ointment (BACTROBAN) 2 %, Apply 1 Application topically 2 (two) times daily., Disp: 22 g, Rfl: 0   tadalafil (CIALIS) 5 MG tablet, TAKE ONE TABLET BY MOUTH DAILY, Disp: 30 tablet, Rfl: 11   Epoetin Alfa-epbx (RETACRIT IJ), , Disp: , Rfl:    meloxicam (MOBIC) 15 MG tablet, TAKE 1 TABLET (15 MG TOTAL) BY MOUTH DAILY. (Patient not taking: Reported on 03/12/2022), Disp: 30 tablet, Rfl: 1 No current facility-administered medications for this visit.  Facility-Administered Medications Ordered in Other Visits:    Darbepoetin Alfa (ARANESP) injection 300 mcg, 300 mcg, Subcutaneous, Weekly, Creig Hines, MD, 300 mcg at 12/26/21 1013   Darbepoetin Alfa (ARANESP) injection 300 mcg, 300 mcg, Subcutaneous, Weekly, Creig Hines, MD, 300 mcg at 02/13/22 1021   Darbepoetin Alfa (ARANESP) injection 600 mcg, 600 mcg, Subcutaneous, Q14 Days, Creig Hines, MD, 600 mcg at 03/13/22 1032   Darbepoetin Alfa (ARANESP) injection 600 mcg, 600 mcg, Subcutaneous, Q14 Days, Creig Hines, MD, 600 mcg at 06/12/22 1052  Physical exam:  Vitals:   06/12/22 1003  BP: 133/65  Pulse: 88  Resp: 20  Temp: 97.6 F (36.4 C)  SpO2: 100%  Weight: 264 lb 1.6 oz (119.8 kg)   Physical Exam Cardiovascular:     Rate and Rhythm: Normal rate and regular rhythm.     Heart sounds: Normal heart sounds.  Pulmonary:     Effort: Pulmonary effort is normal.     Breath sounds: Normal breath sounds.  Skin:    General: Skin is warm and dry.  Neurological:     Mental Status: He is alert and oriented to person, place, and time.         Latest Ref Rng &  Units 06/12/2022    9:49 AM  CMP  Glucose 70 - 99 mg/dL 161   BUN 8 - 23 mg/dL 14   Creatinine 0.96 - 1.24 mg/dL 0.45   Sodium 409 - 811 mmol/L 136   Potassium 3.5 - 5.1 mmol/L 4.7   Chloride 98 - 111  mmol/L 104   CO2 22 - 32 mmol/L 24   Calcium 8.9 - 10.3 mg/dL 8.8   Total Protein 6.5 - 8.1 g/dL 6.6   Total Bilirubin 0.3 - 1.2 mg/dL 1.5   Alkaline Phos 38 - 126 U/L 41   AST 15 - 41 U/L 24   ALT 0 - 44 U/L 15       Latest Ref Rng & Units 06/12/2022    9:49 AM  CBC  WBC 4.0 - 10.5 K/uL 6.8   Hemoglobin 13.0 - 17.0 g/dL 9.3   Hematocrit 16.1 - 52.0 % 29.0   Platelets 150 - 400 K/uL 312     Assessment and plan- Patient is a 77 y.o. male  with low risk MDS with SF 3 B1 mutation.  He is here for routine follow-up  Patient is getting Aranesp 600 mcg every 2 weeks.  Hemoglobin has remained stable between 9-10 with this dosing.  Discussed with the patient that the goal with MDS is to keep him transfusion free and give him a good quality of life even if you are not able to normalize his hemoglobin.  I do not see a reason to change his iron next treatment at this time.  We have been checking his iron numbers every 3 months and there has been a gradual increase in his ferritin levels.  A month ago his ferritin was 914 as compared to 518 in September 2023.  Iron saturation a month ago was 87%.  Discussed with patient that heterozygosity for H63D D typically does not lead to iron overload.  I have asked him to cut down on his alcohol intake and stop taking any over-the-counter iron supplements if he is taking any.  His last MRI abdomen which was done by urology in August2023 had shown evidence of iron deposition.  I would like to repeat his MRI at this time as well.  Given his underlying MDS we are not able to perform any phlebotomy if there is evidence of clinical iron overload.  We may have to consider iron chelating agents if it comes to that.  I will see him back in 2 months time to discuss MRI  results and further management   Visit Diagnosis 1. Refractory anemia with ring sideroblasts (HCC)   2. Erythropoietin (EPO) stimulating agent anemia management patient   3. Elevated ferritin      Dr. Owens Shark, MD, MPH [facility] at [facility] [phone] 06/12/2022 1:23 PM

## 2022-06-23 ENCOUNTER — Ambulatory Visit
Admission: RE | Admit: 2022-06-23 | Discharge: 2022-06-23 | Disposition: A | Discharge status: Home / Self Care | Payer: Medicare Other | Source: Ambulatory Visit | Attending: Urology | Admitting: Urology

## 2022-06-23 DIAGNOSIS — D461 Refractory anemia with ring sideroblasts: Secondary | ICD-10-CM | POA: Diagnosis not present

## 2022-06-23 DIAGNOSIS — Z79899 Other long term (current) drug therapy: Secondary | ICD-10-CM | POA: Diagnosis not present

## 2022-06-23 DIAGNOSIS — D649 Anemia, unspecified: Secondary | ICD-10-CM | POA: Diagnosis not present

## 2022-06-23 DIAGNOSIS — K7689 Other specified diseases of liver: Secondary | ICD-10-CM | POA: Diagnosis not present

## 2022-06-23 DIAGNOSIS — K5730 Diverticulosis of large intestine without perforation or abscess without bleeding: Secondary | ICD-10-CM | POA: Diagnosis not present

## 2022-06-23 MED ORDER — GADOBUTROL 1 MMOL/ML IV SOLN
10.0000 mL | Freq: Once | INTRAVENOUS | Status: AC | PRN
  Administered 2022-06-23: 10 mL via INTRAVENOUS

## 2022-06-24 ENCOUNTER — Inpatient Hospital Stay: Payer: Medicare Other

## 2022-06-24 VITALS — BP 132/56

## 2022-06-24 DIAGNOSIS — D461 Refractory anemia with ring sideroblasts: Secondary | ICD-10-CM

## 2022-06-24 DIAGNOSIS — R7989 Other specified abnormal findings of blood chemistry: Secondary | ICD-10-CM

## 2022-06-24 DIAGNOSIS — D649 Anemia, unspecified: Secondary | ICD-10-CM

## 2022-06-24 LAB — CBC WITH DIFFERENTIAL/PLATELET
Abs Immature Granulocytes: 0.03 10*3/uL (ref 0.00–0.07)
Basophils Absolute: 0 10*3/uL (ref 0.0–0.1)
Basophils Relative: 0 %
Eosinophils Absolute: 0.1 10*3/uL (ref 0.0–0.5)
Eosinophils Relative: 2 %
HCT: 29.3 % — ABNORMAL LOW (ref 39.0–52.0)
Hemoglobin: 9.2 g/dL — ABNORMAL LOW (ref 13.0–17.0)
Immature Granulocytes: 1 %
Lymphocytes Relative: 21 %
Lymphs Abs: 1 10*3/uL (ref 0.7–4.0)
MCH: 33.6 pg (ref 26.0–34.0)
MCHC: 31.4 g/dL (ref 30.0–36.0)
MCV: 106.9 fL — ABNORMAL HIGH (ref 80.0–100.0)
Monocytes Absolute: 0.3 10*3/uL (ref 0.1–1.0)
Monocytes Relative: 6 %
Neutro Abs: 3.1 10*3/uL (ref 1.7–7.7)
Neutrophils Relative %: 70 %
Platelets: 247 10*3/uL (ref 150–400)
RBC: 2.74 MIL/uL — ABNORMAL LOW (ref 4.22–5.81)
RDW: 23.6 % — ABNORMAL HIGH (ref 11.5–15.5)
WBC: 4.5 10*3/uL (ref 4.0–10.5)
nRBC: 0.7 % — ABNORMAL HIGH (ref 0.0–0.2)

## 2022-06-24 LAB — COMPREHENSIVE METABOLIC PANEL
ALT: 16 U/L (ref 0–44)
AST: 19 U/L (ref 15–41)
Albumin: 4 g/dL (ref 3.5–5.0)
Alkaline Phosphatase: 43 U/L (ref 38–126)
Anion gap: 7 (ref 5–15)
BUN: 22 mg/dL (ref 8–23)
CO2: 25 mmol/L (ref 22–32)
Calcium: 8.4 mg/dL — ABNORMAL LOW (ref 8.9–10.3)
Chloride: 104 mmol/L (ref 98–111)
Creatinine, Ser: 1.11 mg/dL (ref 0.61–1.24)
GFR, Estimated: >60 mL/min (ref >60)
Glucose, Bld: 131 mg/dL — ABNORMAL HIGH (ref 70–99)
Potassium: 4.4 mmol/L (ref 3.5–5.1)
Sodium: 136 mmol/L (ref 135–145)
Total Bilirubin: 1.8 mg/dL — ABNORMAL HIGH (ref 0.3–1.2)
Total Protein: 6.5 g/dL (ref 6.5–8.1)

## 2022-06-24 LAB — IRON AND TIBC
Iron: 198 ug/dL — ABNORMAL HIGH (ref 45–182)
Saturation Ratios: 87 % — ABNORMAL HIGH (ref 17.9–39.5)
TIBC: 228 ug/dL — ABNORMAL LOW (ref 250–450)
UIBC: 30 ug/dL

## 2022-06-24 LAB — FERRITIN: Ferritin: 800 ng/mL — ABNORMAL HIGH (ref 24–336)

## 2022-06-24 LAB — GAMMA GT: GGT: 35 U/L (ref 7–50)

## 2022-06-24 MED ORDER — DARBEPOETIN ALFA 300 MCG/0.6ML IJ SOSY
600.0000 ug | PREFILLED_SYRINGE | INTRAMUSCULAR | Status: DC
  Administered 2022-06-24: 600 ug via SUBCUTANEOUS
  Filled 2022-06-24: qty 1.2

## 2022-06-26 ENCOUNTER — Inpatient Hospital Stay: Payer: Medicare Other

## 2022-06-26 ENCOUNTER — Other Ambulatory Visit: Payer: Self-pay | Admitting: Interventional Radiology

## 2022-06-26 DIAGNOSIS — N2889 Other specified disorders of kidney and ureter: Secondary | ICD-10-CM

## 2022-07-02 ENCOUNTER — Ambulatory Visit
Admission: RE | Admit: 2022-07-02 | Discharge: 2022-07-02 | Disposition: A | Discharge status: Home / Self Care | Payer: Medicare Other | Source: Ambulatory Visit | Attending: Interventional Radiology | Admitting: Interventional Radiology

## 2022-07-02 DIAGNOSIS — N2889 Other specified disorders of kidney and ureter: Secondary | ICD-10-CM

## 2022-07-02 NOTE — Progress Notes (Signed)
Chief Complaint: Patient was consulted remotely today (TeleHealth) for follow up of a left renal mass.   History of Present Illness: Brett Boone is a 77 y.o. male seen previously for opinion regarding possible ablation of a left anterior renal mass.  This mass showed little change between MRI studies on 10/16/2019, 04/12/2020, 11/06/2020 and 06/26/2021. Decision was made to continue active MRI surveillance. He continues to have no urinary or abdominal complaints.  A follow-up MRI was performed on 06/23/2022. He continues to be followed by Dr. Smith Robert for MDS, anemia, elevated ferritin and iron deposition in the liver.  Past Medical History:  Diagnosis Date   Anemia    Heart murmur    Hydrocele    MDS (myelodysplastic syndrome) (HCC)    Rotator cuff tear arthropathy of right shoulder 08/01/2020   Venous stasis dermatitis of both lower extremities     Past Surgical History:  Procedure Laterality Date   CATARACT EXTRACTION W/PHACO Right 03/09/2017   Procedure: CATARACT EXTRACTION PHACO AND INTRAOCULAR LENS PLACEMENT (IOC) RIGHT;  Surgeon: Nevada Crane, MD;  Location: [facility];  Service: Ophthalmology;  Laterality: Right;   CATARACT EXTRACTION W/PHACO Left 08/25/2021   Procedure: CATARACT EXTRACTION PHACO AND INTRAOCULAR LENS PLACEMENT (IOC) LEFT;  Surgeon: Nevada Crane, MD;  Location: [facility];  Service: Ophthalmology;  Laterality: Left;   CATARACT EXTRACTION W/PHACO Left 10/13/2021   Procedure: CATARACT EXTRACTION PHACO AND INTRAOCULAR LENS PLACEMENT (IOC) LEFT;  Surgeon: Nevada Crane, MD;  Location: [facility];  Service: Ophthalmology;  Laterality: Left;  8.12 0:52.0   COLONOSCOPY  2013   normal- cleared for 5 yrs- Dr Theodoro Parma   COLONOSCOPY WITH PROPOFOL N/A 02/17/2017   Procedure: COLONOSCOPY WITH PROPOFOL;  Surgeon: Toney Reil, MD;  Location: [facility];  Service: Endoscopy;  Laterality: N/A;   COLONOSCOPY WITH  PROPOFOL N/A 10/12/2019   Procedure: COLONOSCOPY WITH BIOPSY;  Surgeon: Toney Reil, MD;  Location: [facility];  Service: Endoscopy;  Laterality: N/A;   EPIGASTRIC HERNIA REPAIR N/A 07/06/2014   Procedure: HERNIA REPAIR EPIGASTRIC ADULT;  Surgeon: Duwaine Maxin, MD;  Location: [facility] OR[facility];  Service: General;  Laterality: N/A;   HERNIA REPAIR Bilateral    INSERTION OF MESH N/A 07/06/2014   Procedure: INSERTION OF MESH;  Surgeon: Duwaine Maxin, MD;  Location: [facility] OR[facility];  Service: General;  Laterality: N/A;   IR RADIOLOGIST EVAL & MGMT  11/07/2019   IR RADIOLOGIST EVAL & MGMT  04/25/2020   IR RADIOLOGIST EVAL & MGMT  12/05/2020   IR RADIOLOGIST EVAL & MGMT  06/30/2021   POLYPECTOMY  02/17/2017   Procedure: POLYPECTOMY;  Surgeon: Toney Reil, MD;  Location: [facility];  Service: Endoscopy;;   POLYPECTOMY N/A 10/12/2019   Procedure: POLYPECTOMY;  Surgeon: Toney Reil, MD;  Location: [facility];  Service: Endoscopy;  Laterality: N/A;   SHOULDER ARTHROSCOPY Left 2008   SINUS EXPLORATION      Allergies: Amoxicillin-pot clavulanate  Medications: Prior to Admission medications   Medication Sig Start Date End Date Taking? Authorizing Provider  acetaminophen (TYLENOL) 325 MG tablet Take 650 mg by mouth every 6 (six) hours as needed.    [provider]  Calcium Carb-Cholecalciferol 600-400 MG-UNIT TABS Take by mouth.    [provider]  Darbepoetin Alfa (ARANESP) 25 MCG/0.42ML SOSY injection Inject 25 mcg into the skin.    [provider]  Epoetin Alfa-epbx (RETACRIT IJ)     [provider]  ibuprofen (ADVIL) 200 MG tablet Take 200 mg by mouth every 6 (six) hours as needed.    [provider]  meloxicam (MOBIC) 15 MG tablet TAKE 1 TABLET (15 MG TOTAL) BY MOUTH DAILY. Patient not taking: Reported on 03/12/2022 01/20/22   Duanne Limerick, MD  mupirocin ointment (BACTROBAN) 2 % Apply 1 Application topically 2  (two) times daily. 12/12/21   Duanne Limerick, MD  tadalafil (CIALIS) 5 MG tablet TAKE ONE TABLET BY MOUTH DAILY 07/09/21   Sondra Come, MD     Family History  Problem Relation Age of Onset   Diabetes Father    Heart disease Father 39   Stroke Mother     Social History   Socioeconomic History   Marital status: Married    Spouse name: Not on file   Number of children: 3   Years of education: Not on file   Highest education level: Bachelor's degree (e.g., BA, AB, BS)  Occupational History   Occupation: Retired  Tobacco Use   Smoking status: Former    Packs/day: 0.25    Years: 50.00    Additional pack years: 0.00    Total pack years: 12.50    Types: Cigarettes    Quit date: 05/03/2018    Years since quitting: 4.1   Smokeless tobacco: Former   Tobacco comments:    pt quit smoking  Vaping Use   Vaping Use: Never used  Substance and Sexual Activity   Alcohol use: Yes    Comment: Occasional -1x/month   Drug use: No   Sexual activity: Yes  Other Topics Concern   Not on file  Social History Narrative   Not on file   Social Determinants of Health   Financial Resource Strain: Low Risk  (03/12/2022)   Overall Financial Resource Strain (CARDIA)    Difficulty of Paying Living Expenses: Not hard at all  Food Insecurity: No Food Insecurity (03/12/2022)   Hunger Vital Sign    Worried About Running Out of Food in the Last Year: Never true    Ran Out of Food in the Last Year: Never true  Transportation Needs: No Transportation Needs (03/12/2022)   PRAPARE - Administrator, Civil Service (Medical): No    Lack of Transportation (Non-Medical): No  Physical Activity: Sufficiently Active (03/12/2022)   Exercise Vital Sign    Days of Exercise per Week: 4 days    Minutes of Exercise per Session: 40 min  Stress: No Stress Concern Present (03/12/2022)   [employer] of Occupational Health - Occupational Stress Questionnaire    Feeling of Stress : Not at all  Social  Connections: Moderately Integrated (03/12/2022)   Social Connection and Isolation Panel [NHANES]    Frequency of Communication with Friends and Family: More than three times a week    Frequency of Social Gatherings with Friends and Family: More than three times a week    Attends Religious Services: More than 4 times per year    Active Member of [employer] or Organizations: No    Attends Banker Meetings: Never    Marital Status: Married    ECOG Status: 0 - Asymptomatic  Review of Systems  Review of Systems: A 12 point ROS discussed and pertinent positives are indicated in the HPI above.  All other systems are negative.   Physical Exam No direct physical exam was performed (except for noted visual exam findings with Video Visits).   Vital Signs: There were  no vitals taken for this visit.  Imaging: MR Abdomen W Wo Contrast  Result Date: 06/25/2022 CLINICAL DATA:  Follow-up left renal mass and cystic pancreatic lesion. Refractory anemia. EXAM: MRI ABDOMEN WITHOUT AND WITH CONTRAST TECHNIQUE: Multiplanar multisequence MR imaging of the abdomen was performed both before and after the administration of intravenous contrast. CONTRAST:  10mL GADAVIST GADOBUTROL 1 MMOL/ML IV SOLN COMPARISON:  06/26/2021 and 04/12/2020 FINDINGS: Lower chest: No acute findings. Hepatobiliary: No hepatic masses identified. Diffuse hepatic iron deposition again demonstrated. Gallbladder is unremarkable. No evidence of biliary ductal dilatation. Pancreas: 6 mm cystic lesion in the pancreatic head no significant change with probable communication with the main pancreatic duct. No evidence of pancreatic ductal dilatation Spleen: Within normal limits in size stable 2 cm splenic lesion which shows progressive peripheral nodular contrast enhancement, consistent with benign hemangioma. Adrenals/Urinary Tract: Normal adrenal glands. A few tiny benign Bosniak category 1 renal cysts are again seen bilaterally. A  subcapsular Bosniak category 2 hemorrhagic cyst is seen in the lateral lower pole of the left kidney. A subcapsular lesion is again seen in the anterior midpole of the left kidney. This measures 1.5 x 1.2 cm on image 58/36, and shows evidence of contrast enhancement. This is unchanged and suspicious for a small renal cell carcinoma. No evidence of hydronephrosis. Stomach/Bowel: Small hiatal hernia again noted. Left-sided colonic diverticulosis also noted. Vascular/Lymphatic: No pathologically enlarged lymph nodes identified. No acute vascular findings. Other:  None. Musculoskeletal:  No suspicious bone lesions identified. IMPRESSION: Stable 1.5 cm enhancing subcapsular lesion in the anterior midpole of the left kidney, suspicious for renal cell carcinoma. No evidence of abdominal metastatic disease. 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 Recommend continued follow-up by MRI in 2 years. This recommendation follows ACR consensus guidelines: Management of Incidental Pancreatic Cysts: A White Paper of the ACR Incidental Findings Committee. J Am Coll Radiol 2017;14:911-923. 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 Small hiatal hernia. Left colonic diverticulosis. Electronically Signed   By: Danae Orleans M.D.   On: 06/25/2022 12:04    Labs:  CBC: Recent Labs    05/08/22 1326 05/22/22 1327 06/05/22 1347 06/12/22 0949 06/24/22 0959  WBC 6.0 7.6  --  6.8 4.5  HGB 9.2* 9.2* 9.0* 9.3* 9.2*  HCT 28.4* 27.8* 28.0* 29.0* 29.3*  PLT 304 291  --  312 247    COAGS: No results for input(s): "INR", "APTT" in the last 8760 hours.  BMP: Recent Labs    03/13/22 0916 05/08/22 1326 06/12/22 0949 06/24/22 0959  NA 132* 136 136 136  K 4.2 4.2 4.7 4.4  CL 100 106 104 104  CO2 25 24 24 25   GLUCOSE 132* 113* 122* 131*  BUN 16 25* 14 22  CALCIUM 8.6* 8.6* 8.8* 8.4*  CREATININE 1.04 1.16 1.12 1.11  GFRNONAA >60 >60 >60 >60     LIVER FUNCTION TESTS: Recent Labs    03/13/22 0916 05/08/22 1326 06/12/22 0949 06/24/22 0959  BILITOT 1.7* 1.6* 1.5* 1.8*  AST 22 25 24 19   ALT 19 19 15 16   ALKPHOS 41 44 41 43  PROT 6.5 6.6 6.6 6.5  ALBUMIN 4.0 4.0 4.0 4.0    Assessment and Plan:  I spoke with Mr. Lamaster by phone.  The follow-up MRI on 06/23/2022 demonstrates a stable left anterior interpolar cortical mass measuring approximately 1.5 cm in maximum diameter and demonstrating mild contrast-enhancement.  This shows essentially no growth since 2022 and  possibly minimal increase in size since 2021 of only a few millimeters.  I recommended continued surveillance MRI with another MRI in 1 year.  When reviewing appearance of the liver, degree of iron deposition on in and out of phase imaging appears fairly similar to the 2023 study but subjectively appears possibly increased since 2022.   Electronically Signed: Reola Calkins 07/02/2022, 1:57 PM    I spent a total of  15 Minutes in remote  clinical consultation, greater than 50% of which was counseling/coordinating care for a left renal mass.    Visit type: Audio only (telephone). Audio (no video) only due to patient's lack of internet/smartphone capability. Alternative for in-person consultation at [facility], [address] [place], [place], [place]. This visit type was conducted due to national recommendations for restrictions regarding the COVID-19 Pandemic (e.g. social distancing).  This format is felt to be most appropriate for this patient at this time.  All issues noted in this document were discussed and addressed.

## 2022-07-06 ENCOUNTER — Other Ambulatory Visit: Payer: Self-pay | Admitting: *Deleted

## 2022-07-06 DIAGNOSIS — Z79899 Other long term (current) drug therapy: Secondary | ICD-10-CM

## 2022-07-06 DIAGNOSIS — D461 Refractory anemia with ring sideroblasts: Secondary | ICD-10-CM

## 2022-07-08 ENCOUNTER — Inpatient Hospital Stay: Payer: Medicare Other | Attending: Nurse Practitioner

## 2022-07-08 ENCOUNTER — Inpatient Hospital Stay: Payer: Medicare Other

## 2022-07-08 VITALS — BP 131/76

## 2022-07-08 DIAGNOSIS — D461 Refractory anemia with ring sideroblasts: Secondary | ICD-10-CM | POA: Insufficient documentation

## 2022-07-08 DIAGNOSIS — D649 Anemia, unspecified: Secondary | ICD-10-CM

## 2022-07-08 LAB — HEMOGLOBIN AND HEMATOCRIT, BLOOD
HCT: 31.3 % — ABNORMAL LOW (ref 39.0–52.0)
Hemoglobin: 9.8 g/dL — ABNORMAL LOW (ref 13.0–17.0)

## 2022-07-08 MED ORDER — DARBEPOETIN ALFA 500 MCG/ML IJ SOSY
500.0000 ug | PREFILLED_SYRINGE | Freq: Once | INTRAMUSCULAR | Status: AC
  Administered 2022-07-08: 500 ug via SUBCUTANEOUS
  Filled 2022-07-08: qty 1

## 2022-07-08 MED ORDER — DARBEPOETIN ALFA 100 MCG/0.5ML IJ SOSY
100.0000 ug | PREFILLED_SYRINGE | Freq: Once | INTRAMUSCULAR | Status: AC
  Administered 2022-07-08: 100 ug via SUBCUTANEOUS
  Filled 2022-07-08: qty 0.5

## 2022-07-08 MED ORDER — DARBEPOETIN ALFA 500 MCG/ML IJ SOSY: 600.0000 ug | PREFILLED_SYRINGE | INTRAMUSCULAR | Status: DC

## 2022-07-10 ENCOUNTER — Inpatient Hospital Stay: Payer: Medicare Other

## 2022-07-15 ENCOUNTER — Ambulatory Visit
Admission: RE | Admit: 2022-07-15 | Discharge: 2022-07-15 | Disposition: A | Discharge status: Home / Self Care | Payer: Medicare Other | Source: Ambulatory Visit | Attending: Physician Assistant | Admitting: Physician Assistant

## 2022-07-15 ENCOUNTER — Ambulatory Visit (INDEPENDENT_AMBULATORY_CARE_PROVIDER_SITE_OTHER): Payer: Medicare Other | Admitting: Physician Assistant

## 2022-07-15 ENCOUNTER — Encounter: Payer: Self-pay | Admitting: Physician Assistant

## 2022-07-15 ENCOUNTER — Other Ambulatory Visit: Payer: Self-pay | Admitting: Family Medicine

## 2022-07-15 VITALS — BP 122/76 | HR 66 | Temp 98.4°F | Ht 70.0 in | Wt 253.0 lb

## 2022-07-15 DIAGNOSIS — L03116 Cellulitis of left lower limb: Secondary | ICD-10-CM | POA: Diagnosis not present

## 2022-07-15 DIAGNOSIS — I878 Other specified disorders of veins: Secondary | ICD-10-CM | POA: Diagnosis not present

## 2022-07-15 DIAGNOSIS — R2242 Localized swelling, mass and lump, left lower limb: Secondary | ICD-10-CM

## 2022-07-15 DIAGNOSIS — R000 Tachycardia, unspecified: Secondary | ICD-10-CM | POA: Diagnosis not present

## 2022-07-15 DIAGNOSIS — R509 Fever, unspecified: Secondary | ICD-10-CM

## 2022-07-15 DIAGNOSIS — R6520 Severe sepsis without septic shock: Secondary | ICD-10-CM | POA: Diagnosis not present

## 2022-07-15 DIAGNOSIS — R918 Other nonspecific abnormal finding of lung field: Secondary | ICD-10-CM | POA: Diagnosis not present

## 2022-07-15 DIAGNOSIS — D461 Refractory anemia with ring sideroblasts: Secondary | ICD-10-CM | POA: Diagnosis not present

## 2022-07-15 DIAGNOSIS — N179 Acute kidney failure, unspecified: Secondary | ICD-10-CM | POA: Diagnosis not present

## 2022-07-15 DIAGNOSIS — I498 Other specified cardiac arrhythmias: Secondary | ICD-10-CM | POA: Diagnosis not present

## 2022-07-15 DIAGNOSIS — Z791 Long term (current) use of non-steroidal anti-inflammatories (NSAID): Secondary | ICD-10-CM | POA: Diagnosis not present

## 2022-07-15 DIAGNOSIS — I872 Venous insufficiency (chronic) (peripheral): Secondary | ICD-10-CM | POA: Insufficient documentation

## 2022-07-15 DIAGNOSIS — E871 Hypo-osmolality and hyponatremia: Secondary | ICD-10-CM | POA: Diagnosis not present

## 2022-07-15 DIAGNOSIS — Z823 Family history of stroke: Secondary | ICD-10-CM | POA: Diagnosis not present

## 2022-07-15 DIAGNOSIS — Z87891 Personal history of nicotine dependence: Secondary | ICD-10-CM | POA: Diagnosis not present

## 2022-07-15 DIAGNOSIS — E861 Hypovolemia: Secondary | ICD-10-CM | POA: Diagnosis not present

## 2022-07-15 DIAGNOSIS — A419 Sepsis, unspecified organism: Secondary | ICD-10-CM | POA: Diagnosis not present

## 2022-07-15 DIAGNOSIS — Z8249 Family history of ischemic heart disease and other diseases of the circulatory system: Secondary | ICD-10-CM | POA: Diagnosis not present

## 2022-07-15 DIAGNOSIS — R011 Cardiac murmur, unspecified: Secondary | ICD-10-CM

## 2022-07-15 DIAGNOSIS — M1712 Unilateral primary osteoarthritis, left knee: Secondary | ICD-10-CM | POA: Diagnosis not present

## 2022-07-15 DIAGNOSIS — K869 Disease of pancreas, unspecified: Secondary | ICD-10-CM | POA: Insufficient documentation

## 2022-07-15 DIAGNOSIS — D469 Myelodysplastic syndrome, unspecified: Secondary | ICD-10-CM | POA: Insufficient documentation

## 2022-07-15 DIAGNOSIS — R739 Hyperglycemia, unspecified: Secondary | ICD-10-CM | POA: Diagnosis not present

## 2022-07-15 DIAGNOSIS — I4719 Other supraventricular tachycardia: Secondary | ICD-10-CM | POA: Diagnosis not present

## 2022-07-15 DIAGNOSIS — Z1152 Encounter for screening for COVID-19: Secondary | ICD-10-CM | POA: Diagnosis not present

## 2022-07-15 DIAGNOSIS — R600 Localized edema: Secondary | ICD-10-CM | POA: Diagnosis not present

## 2022-07-15 DIAGNOSIS — M7989 Other specified soft tissue disorders: Secondary | ICD-10-CM | POA: Diagnosis not present

## 2022-07-15 DIAGNOSIS — D689 Coagulation defect, unspecified: Secondary | ICD-10-CM | POA: Diagnosis not present

## 2022-07-15 DIAGNOSIS — Z833 Family history of diabetes mellitus: Secondary | ICD-10-CM | POA: Diagnosis not present

## 2022-07-15 DIAGNOSIS — R17 Unspecified jaundice: Secondary | ICD-10-CM | POA: Diagnosis not present

## 2022-07-15 LAB — POCT INFLUENZA A/B
Influenza A, POC: NEGATIVE
Influenza B, POC: NEGATIVE

## 2022-07-15 LAB — POC COVID19 BINAXNOW: SARS Coronavirus 2 Ag: NEGATIVE

## 2022-07-15 MED ORDER — MELOXICAM 15 MG PO TABS
15.0000 mg | ORAL_TABLET | Freq: Every day | ORAL | 0 refills | Status: DC
Start: 2022-07-15 — End: 2023-03-04

## 2022-07-15 MED ORDER — SULFAMETHOXAZOLE-TRIMETHOPRIM 800-160 MG PO TABS
1.0000 | ORAL_TABLET | Freq: Two times a day (BID) | ORAL | 0 refills | Status: DC
Start: 2022-07-15 — End: 2022-07-19

## 2022-07-15 NOTE — Progress Notes (Signed)
Date:  07/15/2022   Name:  Brett Boone   DOB:  08/21/1945   MRN:  [id number]   Chief Complaint: Fever and Edema (Left leg, happen all together with other symptoms )  Fever  This is a new problem. The current episode started today. The problem has been unchanged. Maximum temperature: 101F. The temperature was taken using a tympanic thermometer. Associated symptoms include headaches, muscle aches, nausea and vomiting. Pertinent negatives include no abdominal pain, chest pain, coughing, diarrhea or wheezing. Associated symptoms comments: Sweating, chills,dizziness. He has tried acetaminophen for the symptoms. The treatment provided no relief.  Risk factors: recent travel   Risk factors comment:  Traveled to [place], ate crab cakes last night  Brett Boone is a pleasant 77 y.o. male with myelodysplastic syndrome, venous insufficiency, and previously "unspecified arrhythmia" new to me today (typically sees me colleague Dr. Elizabeth Sauer, MD) joined by his wife Brett Boone for evaluation of acute onset fever, chills, nausea, vomiting since early this morning around 3 a.m. Had single episode of emesis at the time, wife gave him phenergan, no vomiting since, tolerated half cup coffee this morning, no food challenge yet. Tylenol also around 3 a.m. seems to be keeping fever under control. Patient's main concern is acute severely painful left leg swelling below the knee which actually prevents him from walking at the time of presentation (presents in wheelchair). He is wearing a compression stocking, not sure if helping. Root canal last week, unsure if related.    Medication list has been reviewed and updated.  Current Meds  Medication Sig   acetaminophen (TYLENOL) 325 MG tablet Take 650 mg by mouth every 6 (six) hours as needed.   Calcium Carb-Cholecalciferol 600-400 MG-UNIT TABS Take by mouth.   Darbepoetin Alfa (ARANESP) 25 MCG/0.42ML SOSY injection Inject 25 mcg into the skin.   ibuprofen (ADVIL) 200 MG  tablet Take 200 mg by mouth every 6 (six) hours as needed.   mupirocin ointment (BACTROBAN) 2 % Apply 1 Application topically 2 (two) times daily.   tadalafil (CIALIS) 5 MG tablet TAKE ONE TABLET BY MOUTH DAILY     Review of Systems  Constitutional:  Positive for chills, diaphoresis and fever.  Respiratory:  Negative for cough, chest tightness, shortness of breath and wheezing.   Cardiovascular:  Positive for leg swelling (left, painful, hot, red). Negative for chest pain and palpitations.  Gastrointestinal:  Positive for nausea and vomiting. Negative for abdominal pain, blood in stool, constipation and diarrhea.  Neurological:  Positive for headaches.    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   Weight loss 08/15/2019   Elevated ferritin 08/15/2019   Erectile dysfunction 07/25/2019   Class 2 severe obesity due to excess calories with serious comorbidity and body mass index (BMI) of 36.0 to 36.9 in adult [facility]) 07/19/2019   History of adenomatous polyp of colon    Epigastric hernia 07/18/2014    Allergies  Allergen Reactions   Amoxicillin-Pot Clavulanate Hives and Nausea And Vomiting    Immunization History  Administered Date(s) Administered   Fluad Quad(high Dose  65+) 11/14/2018, 11/21/2019   Influenza Split 11/25/2021   Influenza, High Dose Seasonal PF 12/14/2016, 12/15/2017   Influenza,inj,Quad PF,6+ Mos 10/22/2015   Moderna Sars-Covid-2 Vaccination 01/04/2020   PFIZER(Purple Top)SARS-COV-2 Vaccination 03/08/2019, 03/29/2019    Pneumococcal Conjugate-13 12/14/2016   Pneumococcal Polysaccharide-23 08/30/2014   Tdap 08/30/2014    Past Surgical History:  Procedure Laterality Date   CATARACT EXTRACTION W/PHACO Right 03/09/2017   Procedure: CATARACT EXTRACTION PHACO AND INTRAOCULAR LENS PLACEMENT (IOC) RIGHT;  Surgeon: Nevada Crane, MD;  Location: [facility];  Service: Ophthalmology;  Laterality: Right;   CATARACT EXTRACTION W/PHACO Left 08/25/2021   Procedure: CATARACT EXTRACTION PHACO AND INTRAOCULAR LENS PLACEMENT (IOC) LEFT;  Surgeon: Nevada Crane, MD;  Location: [facility];  Service: Ophthalmology;  Laterality: Left;   CATARACT EXTRACTION W/PHACO Left 10/13/2021   Procedure: CATARACT EXTRACTION PHACO AND INTRAOCULAR LENS PLACEMENT (IOC) LEFT;  Surgeon: Nevada Crane, MD;  Location: [facility];  Service: Ophthalmology;  Laterality: Left;  8.12 0:52.0   COLONOSCOPY  2013   normal- cleared for 5 yrs- Dr Theodoro Parma   COLONOSCOPY WITH PROPOFOL N/A 02/17/2017   Procedure: COLONOSCOPY WITH PROPOFOL;  Surgeon: Toney Reil, MD;  Location: [facility];  Service: Endoscopy;  Laterality: N/A;   COLONOSCOPY WITH PROPOFOL N/A 10/12/2019   Procedure: COLONOSCOPY WITH BIOPSY;  Surgeon: Toney Reil, MD;  Location: [facility];  Service: Endoscopy;  Laterality: N/A;   EPIGASTRIC HERNIA REPAIR N/A 07/06/2014   Procedure: HERNIA REPAIR EPIGASTRIC ADULT;  Surgeon: Duwaine Maxin, MD;  Location: [facility] ORS;  Service: General;  Laterality: N/A;   HERNIA REPAIR Bilateral    INSERTION OF MESH N/A 07/06/2014   Procedure: INSERTION OF MESH;  Surgeon: Duwaine Maxin, MD;  Location: [facility] OR[facility];  Service: General;  Laterality: N/A;   IR RADIOLOGIST EVAL & MGMT  11/07/2019   IR RADIOLOGIST EVAL & MGMT  04/25/2020   IR RADIOLOGIST EVAL & MGMT  12/05/2020   IR RADIOLOGIST EVAL & MGMT  06/30/2021   POLYPECTOMY  02/17/2017   Procedure: POLYPECTOMY;  Surgeon: Toney Reil,  MD;  Location: [facility];  Service: Endoscopy;;   POLYPECTOMY N/A 10/12/2019   Procedure: POLYPECTOMY;  Surgeon: Toney Reil, MD;  Location: [facility];  Service: Endoscopy;  Laterality: N/A;   SHOULDER ARTHROSCOPY Left 2008   SINUS EXPLORATION      Social History   Tobacco Use   Smoking status: Former    Packs/day: 0.25    Years: 50.00    Additional pack years: 0.00    Total pack years: 12.50    Types: Cigarettes    Quit date: 05/03/2018    Years since quitting: 4.2   Smokeless tobacco: Former   Tobacco comments:    pt quit smoking  Vaping Use   Vaping Use: Never used  Substance Use Topics   Alcohol use: Yes    Comment: Occasional -1x/month   Drug use: No    Family History  Problem Relation Age of Onset   Diabetes Father    Heart disease Father 58   Stroke Mother         07/15/2022   10:14 AM 12/12/2021   11:17 AM 11/27/2021    3:23 PM 08/26/2021    2:02 PM  GAD 7 : Generalized Anxiety Score  Nervous, Anxious, on Edge 0 0 0 0  Control/stop worrying 0 0 0 0  Worry too much - different things 0 0 0 0  Trouble relaxing  0 0 0 0  Restless 0 0 0 0  Easily annoyed or irritable 0 0 0 0  Afraid - awful might happen 0 0 0 0  Total GAD 7 Score 0 0 0 0  Anxiety Difficulty Not difficult at all Not difficult at all Not difficult at all Not difficult at all       07/15/2022   10:14 AM 03/12/2022    2:03 PM 12/12/2021   11:17 AM  Depression screen PHQ 2/9  Decreased Interest 0 0 0  Down, Depressed, Hopeless 0 0 0  PHQ - 2 Score 0 0 0  Altered sleeping 0 0 0  Tired, decreased energy 0 0 0  Change in appetite 0 0 0  Feeling bad or failure about yourself  0 0 0  Trouble concentrating 0 0 0  Moving slowly or fidgety/restless 0 0 0  Suicidal thoughts 0 0 0  PHQ-9 Score 0 0 0  Difficult doing work/chores Not difficult at all Not difficult at all Not difficult at all    BP Readings from Last 3 Encounters:  07/15/22 122/76  07/08/22 131/76   06/24/22 (!) 132/56    Wt Readings from Last 3 Encounters:  07/15/22 253 lb (114.8 kg)  06/12/22 264 lb 1.6 oz (119.8 kg)  03/13/22 261 lb 11.2 oz (118.7 kg)    BP 122/76   Pulse 66   Temp 98.4 F (36.9 C) (Oral)   Ht 5\' 10"  (1.778 m)   Wt 253 lb (114.8 kg)   SpO2 96%   BMI 36.30 kg/m   Physical Exam Vitals and nursing note reviewed.  Constitutional:      Appearance: Normal appearance.  Cardiovascular:     Rate and Rhythm: Normal rate. Rhythm irregularly irregular.     Heart sounds: Murmur heard.     Systolic murmur is present with a grade of 4/6.     No friction rub. Gallop present.     Comments: LLE edema, erythema, and severe tenderness. Left calf measures 42 cm circumference compared to 40.5 cm on right.  Pulmonary:     Effort: Pulmonary effort is normal.     Breath sounds: Normal breath sounds.  Musculoskeletal:     Left lower leg: 2+ Pitting Edema present.    EKG: Regular rate at 93 bpm but highly variable. Irregularly irregular rhythm with variable P wave morphology (>3 waveforms identified). No ST changes. Wandering atrial pacemaker, same as previous 08/26/21. A portion of Lead II is included below.     Recent Labs     Component Value Date/Time   NA 136 06/24/2022 0959   NA 141 07/19/2019 0948   K 4.4 06/24/2022 0959   CL 104 06/24/2022 0959   CO2 25 06/24/2022 0959   GLUCOSE 131 (H) 06/24/2022 0959   BUN 22 06/24/2022 0959   BUN 20 07/19/2019 0948   CREATININE 1.11 06/24/2022 0959   CALCIUM 8.4 (L) 06/24/2022 0959   PROT 6.5 06/24/2022 0959   PROT 6.4 07/19/2019 0948   ALBUMIN 4.0 06/24/2022 0959   ALBUMIN 4.1 07/23/2020 0922   AST 19 06/24/2022 0959   ALT 16 06/24/2022 0959   ALKPHOS 43 06/24/2022 0959   BILITOT 1.8 (H) 06/24/2022 0959   BILITOT 0.8 07/23/2020 0922   GFRNONAA >60 06/24/2022 0959   GFRAA 84 07/19/2019 0948    Lab Results  Component Value Date   WBC 4.5 06/24/2022   HGB 9.8 (L) 07/08/2022   HCT 31.3 (L) 07/08/2022   MCV  106.9 (H) 06/24/2022   PLT 247 06/24/2022   Lab Results  Component Value Date   HGBA1C 5.8 (H) 08/30/2014   Lab Results  Component Value Date   CHOL 62 (L) 07/25/2021   HDL 47 07/25/2021   LDLCALC 3 07/25/2021   TRIG 34 07/25/2021   CHOLHDL 1.4 01/04/2018   Lab Results  Component Value Date   TSH 1.275 08/15/2019     Assessment and Plan:   1. Localized swelling of left lower leg Needs STAT labs and duplex u/s to r/o DVT - Wells score 4+ points. Root canal probably not related but may be.  Differential also includes cellulitis, erysipelas, CRPS. ED precautions advised, consider hospitalization if rapidly progressing. No symptoms of PE at this time.  - VAS [place] LOWER EXTREMITY VENOUS (DVT) - D-Dimer, Quantitative - Comprehensive metabolic panel - CBC with Differential/Platelet - US Venous Img Lower Unilateral Left (DVT)  2. Fever with chills Negative COVID/flu. Could be viral illness/gastritis. Check CBC/CMP.  - POC COVID-19 - POCT Influenza A/B - Comprehensive metabolic panel - CBC with Differential/Platelet  3. Wandering atrial pacemaker EKG obtained today showing irregularly irregular rhythm with variable P wave morphology (>3 waveforms identified) confirming diagnosis of wandering atrial pacemaker. Patient informed.  - EKG 12-Lead  4. Systolic murmur 4/6 systolic murmur with gallop appreciated today. Last cardiology note 09/17/21 did not qualify the murmur and did not mention gallop. Monitor for now, has cardiology f/u 09/16/22    F/u TBD  >60 min provider time spent today addressing high-priority acute complaints, in-office diagnostic testing, STAT workup and care-coordination, consideration of hospitalization.    Partially dictated using Animal nutritionist. Any errors are unintentional.  Alvester Morin, PA-C, DMSc, Nutritionist [facility] [phone]

## 2022-07-15 NOTE — Progress Notes (Signed)
Spoke with wife Eunice Blase. Doppler negative for DVT Temp just now 98.2 F Will cover for infection using Bactrim For pain use meloxicam daily + tyelnol 1000 mg q6h PRN STAT labs pending

## 2022-07-16 ENCOUNTER — Emergency Department: Payer: Medicare Other

## 2022-07-16 ENCOUNTER — Encounter: Payer: Self-pay | Admitting: Oncology

## 2022-07-16 ENCOUNTER — Other Ambulatory Visit: Payer: Self-pay

## 2022-07-16 ENCOUNTER — Inpatient Hospital Stay
Admission: EM | Admit: 2022-07-16 | Discharge: 2022-07-19 | DRG: 872 | Disposition: A | Discharge status: Home / Self Care | Payer: Medicare Other | Attending: Student in an Organized Health Care Education/Training Program | Admitting: Student in an Organized Health Care Education/Training Program

## 2022-07-16 DIAGNOSIS — D649 Anemia, unspecified: Secondary | ICD-10-CM

## 2022-07-16 DIAGNOSIS — E669 Obesity, unspecified: Secondary | ICD-10-CM | POA: Diagnosis present

## 2022-07-16 DIAGNOSIS — R17 Unspecified jaundice: Secondary | ICD-10-CM | POA: Diagnosis present

## 2022-07-16 DIAGNOSIS — E871 Hypo-osmolality and hyponatremia: Secondary | ICD-10-CM

## 2022-07-16 DIAGNOSIS — Z823 Family history of stroke: Secondary | ICD-10-CM

## 2022-07-16 DIAGNOSIS — Z833 Family history of diabetes mellitus: Secondary | ICD-10-CM

## 2022-07-16 DIAGNOSIS — Z8249 Family history of ischemic heart disease and other diseases of the circulatory system: Secondary | ICD-10-CM | POA: Diagnosis not present

## 2022-07-16 DIAGNOSIS — R011 Cardiac murmur, unspecified: Secondary | ICD-10-CM | POA: Diagnosis not present

## 2022-07-16 DIAGNOSIS — I4891 Unspecified atrial fibrillation: Secondary | ICD-10-CM | POA: Diagnosis not present

## 2022-07-16 DIAGNOSIS — R6520 Severe sepsis without septic shock: Secondary | ICD-10-CM

## 2022-07-16 DIAGNOSIS — Z791 Long term (current) use of non-steroidal anti-inflammatories (NSAID): Secondary | ICD-10-CM | POA: Diagnosis not present

## 2022-07-16 DIAGNOSIS — L03116 Cellulitis of left lower limb: Secondary | ICD-10-CM | POA: Diagnosis not present

## 2022-07-16 DIAGNOSIS — A419 Sepsis, unspecified organism: Principal | ICD-10-CM

## 2022-07-16 DIAGNOSIS — L0390 Cellulitis, unspecified: Secondary | ICD-10-CM

## 2022-07-16 DIAGNOSIS — E861 Hypovolemia: Secondary | ICD-10-CM | POA: Diagnosis present

## 2022-07-16 DIAGNOSIS — R918 Other nonspecific abnormal finding of lung field: Secondary | ICD-10-CM | POA: Diagnosis not present

## 2022-07-16 DIAGNOSIS — I4719 Other supraventricular tachycardia: Secondary | ICD-10-CM | POA: Diagnosis present

## 2022-07-16 DIAGNOSIS — Z6835 Body mass index (BMI) 35.0-35.9, adult: Secondary | ICD-10-CM

## 2022-07-16 DIAGNOSIS — R739 Hyperglycemia, unspecified: Secondary | ICD-10-CM

## 2022-07-16 DIAGNOSIS — I878 Other specified disorders of veins: Secondary | ICD-10-CM | POA: Diagnosis present

## 2022-07-16 DIAGNOSIS — N179 Acute kidney failure, unspecified: Secondary | ICD-10-CM | POA: Diagnosis not present

## 2022-07-16 DIAGNOSIS — Z87891 Personal history of nicotine dependence: Secondary | ICD-10-CM

## 2022-07-16 DIAGNOSIS — D461 Refractory anemia with ring sideroblasts: Secondary | ICD-10-CM | POA: Diagnosis present

## 2022-07-16 DIAGNOSIS — R600 Localized edema: Secondary | ICD-10-CM | POA: Diagnosis not present

## 2022-07-16 DIAGNOSIS — Z1152 Encounter for screening for COVID-19: Secondary | ICD-10-CM

## 2022-07-16 DIAGNOSIS — R000 Tachycardia, unspecified: Secondary | ICD-10-CM | POA: Diagnosis not present

## 2022-07-16 DIAGNOSIS — D689 Coagulation defect, unspecified: Secondary | ICD-10-CM | POA: Diagnosis present

## 2022-07-16 DIAGNOSIS — M1712 Unilateral primary osteoarthritis, left knee: Secondary | ICD-10-CM | POA: Diagnosis not present

## 2022-07-16 DIAGNOSIS — M7989 Other specified soft tissue disorders: Secondary | ICD-10-CM | POA: Diagnosis not present

## 2022-07-16 LAB — COMPREHENSIVE METABOLIC PANEL
ALT: 15 IU/L (ref 0–44)
AST: 24 IU/L (ref 0–40)
Albumin: 4.1 g/dL (ref 3.8–4.8)
Alkaline Phosphatase: 50 IU/L (ref 44–121)
BUN/Creatinine Ratio: 16 (ref 10–24)
BUN: 27 mg/dL (ref 8–27)
Bilirubin Total: 3.3 mg/dL — ABNORMAL HIGH (ref 0.0–1.2)
CO2: 21 mmol/L (ref 20–29)
Calcium: 8.8 mg/dL (ref 8.6–10.2)
Chloride: 99 mmol/L (ref 96–106)
Creatinine, Ser: 1.65 mg/dL — ABNORMAL HIGH (ref 0.76–1.27)
Globulin, Total: 1.8 g/dL (ref 1.5–4.5)
Glucose: 121 mg/dL — ABNORMAL HIGH (ref 70–99)
Potassium: 4.7 mmol/L (ref 3.5–5.2)
Sodium: 136 mmol/L (ref 134–144)
Total Protein: 5.9 g/dL — ABNORMAL LOW (ref 6.0–8.5)
eGFR: 43 mL/min/{1.73_m2} — ABNORMAL LOW (ref >59)

## 2022-07-16 LAB — URINALYSIS, W/ REFLEX TO CULTURE (INFECTION SUSPECTED)
Bilirubin Urine: NEGATIVE
Glucose, UA: NEGATIVE mg/dL
Ketones, ur: NEGATIVE mg/dL
Leukocytes,Ua: NEGATIVE
Nitrite: NEGATIVE
Protein, ur: NEGATIVE mg/dL
Specific Gravity, Urine: 1.021 (ref 1.005–1.030)
Squamous Epithelial / HPF: NONE SEEN /HPF (ref 0–5)
pH: 5 (ref 5.0–8.0)

## 2022-07-16 LAB — CBC WITH DIFFERENTIAL/PLATELET
Abs Immature Granulocytes: 0.09 10*3/uL — ABNORMAL HIGH (ref 0.00–0.07)
Basophils Absolute: 0.2 10*3/uL (ref 0.0–0.2)
Basophils Absolute: 0.1 10*3/uL (ref 0.0–0.1)
Basophils Relative: 0 %
Basos: 1 %
EOS (ABSOLUTE): 0 10*3/uL (ref 0.0–0.4)
Eos: 0 %
Eosinophils Absolute: 0 10*3/uL (ref 0.0–0.5)
Eosinophils Relative: 0 %
HCT: 27.3 % — ABNORMAL LOW (ref 39.0–52.0)
Hematocrit: 29.1 % — ABNORMAL LOW (ref 37.5–51.0)
Hemoglobin: 9.5 g/dL — ABNORMAL LOW (ref 13.0–17.7)
Hemoglobin: 8.6 g/dL — ABNORMAL LOW (ref 13.0–17.0)
Immature Grans (Abs): 0.5 10*3/uL — ABNORMAL HIGH (ref 0.0–0.1)
Immature Granulocytes: 1 %
Immature Granulocytes: 1 %
Lymphocytes Absolute: 0.7 10*3/uL (ref 0.7–3.1)
Lymphocytes Relative: 4 %
Lymphs Abs: 0.8 10*3/uL (ref 0.7–4.0)
Lymphs: 2 %
MCH: 33.5 pg — ABNORMAL HIGH (ref 26.6–33.0)
MCH: 33.7 pg (ref 26.0–34.0)
MCHC: 32.6 g/dL (ref 31.5–35.7)
MCHC: 31.5 g/dL (ref 30.0–36.0)
MCV: 103 fL — ABNORMAL HIGH (ref 79–97)
MCV: 107.1 fL — ABNORMAL HIGH (ref 80.0–100.0)
Monocytes Absolute: 1.4 10*3/uL — ABNORMAL HIGH (ref 0.1–0.9)
Monocytes Absolute: 1.2 10*3/uL — ABNORMAL HIGH (ref 0.1–1.0)
Monocytes Relative: 6 %
Monocytes: 3 %
Neutro Abs: 17.3 10*3/uL — ABNORMAL HIGH (ref 1.7–7.7)
Neutrophils Absolute: 38.8 10*3/uL — ABNORMAL HIGH (ref 1.4–7.0)
Neutrophils Relative %: 89 %
Neutrophils: 93 %
Platelets: 375 10*3/uL (ref 150–450)
Platelets: 275 10*3/uL (ref 150–400)
RBC: 2.84 x10E6/uL — ABNORMAL LOW (ref 4.14–5.80)
RBC: 2.55 MIL/uL — ABNORMAL LOW (ref 4.22–5.81)
RDW: 21.8 % — ABNORMAL HIGH (ref 11.6–15.4)
RDW: 23.3 % — ABNORMAL HIGH (ref 11.5–15.5)
Smear Review: NORMAL
WBC: 41.5 10*3/uL (ref 3.4–10.8)
WBC: 19.4 10*3/uL — ABNORMAL HIGH (ref 4.0–10.5)
nRBC: 0.2 % (ref 0.0–0.2)

## 2022-07-16 LAB — HEPATIC FUNCTION PANEL
ALT: 16 U/L (ref 0–44)
AST: 21 U/L (ref 15–41)
Albumin: 3.9 g/dL (ref 3.5–5.0)
Alkaline Phosphatase: 42 U/L (ref 38–126)
Bilirubin, Direct: 0.4 mg/dL — ABNORMAL HIGH (ref 0.0–0.2)
Indirect Bilirubin: 3 mg/dL — ABNORMAL HIGH (ref 0.3–0.9)
Total Bilirubin: 3.4 mg/dL — ABNORMAL HIGH (ref 0.3–1.2)
Total Protein: 6.6 g/dL (ref 6.5–8.1)

## 2022-07-16 LAB — D-DIMER, QUANTITATIVE: D-DIMER: 0.53 mg/L FEU — ABNORMAL HIGH (ref 0.00–0.49)

## 2022-07-16 LAB — APTT: aPTT: 45 seconds — ABNORMAL HIGH (ref 24–36)

## 2022-07-16 LAB — BASIC METABOLIC PANEL
Anion gap: 7 (ref 5–15)
BUN: 36 mg/dL — ABNORMAL HIGH (ref 8–23)
CO2: 24 mmol/L (ref 22–32)
Calcium: 8.2 mg/dL — ABNORMAL LOW (ref 8.9–10.3)
Chloride: 98 mmol/L (ref 98–111)
Creatinine, Ser: 1.54 mg/dL — ABNORMAL HIGH (ref 0.61–1.24)
GFR, Estimated: 47 mL/min — ABNORMAL LOW (ref >60)
Glucose, Bld: 152 mg/dL — ABNORMAL HIGH (ref 70–99)
Potassium: 4 mmol/L (ref 3.5–5.1)
Sodium: 129 mmol/L — ABNORMAL LOW (ref 135–145)

## 2022-07-16 LAB — PROTIME-INR
INR: 1.6 — ABNORMAL HIGH (ref 0.8–1.2)
Prothrombin Time: 19.1 seconds — ABNORMAL HIGH (ref 11.4–15.2)

## 2022-07-16 LAB — LACTIC ACID, PLASMA
Lactic Acid, Venous: 1.6 mmol/L (ref 0.5–1.9)
Lactic Acid, Venous: 1.7 mmol/L (ref 0.5–1.9)

## 2022-07-16 LAB — GLUCOSE, CAPILLARY: Glucose-Capillary: 97 mg/dL (ref 70–99)

## 2022-07-16 LAB — SEDIMENTATION RATE: Sed Rate: 67 mm/hr — ABNORMAL HIGH (ref 0–20)

## 2022-07-16 LAB — MRSA NEXT GEN BY PCR, NASAL: MRSA by PCR Next Gen: DETECTED — AB

## 2022-07-16 LAB — LIPASE, BLOOD: Lipase: 30 U/L (ref 11–51)

## 2022-07-16 MED ORDER — NOREPINEPHRINE 4 MG/250ML-% IV SOLN
0.0000 ug/min | INTRAVENOUS | Status: DC
  Filled 2022-07-16: qty 250

## 2022-07-16 MED ORDER — ACETAMINOPHEN 650 MG RE SUPP: 650.0000 mg | Freq: Four times a day (QID) | RECTAL | Status: DC | PRN

## 2022-07-16 MED ORDER — VANCOMYCIN HCL IN DEXTROSE 1-5 GM/200ML-% IV SOLN
1000.0000 mg | Freq: Once | INTRAVENOUS | Status: AC
  Administered 2022-07-16: 1000 mg via INTRAVENOUS
  Filled 2022-07-16: qty 200

## 2022-07-16 MED ORDER — FENTANYL CITRATE PF 50 MCG/ML IJ SOSY: 50.0000 ug | PREFILLED_SYRINGE | Freq: Once | INTRAMUSCULAR | Status: DC

## 2022-07-16 MED ORDER — SODIUM CHLORIDE 0.9% FLUSH
3.0000 mL | Freq: Two times a day (BID) | INTRAVENOUS | Status: DC
  Administered 2022-07-16 – 2022-07-19 (×6): 3 mL via INTRAVENOUS

## 2022-07-16 MED ORDER — IOHEXOL 300 MG/ML  SOLN
100.0000 mL | Freq: Once | INTRAMUSCULAR | Status: AC | PRN
  Administered 2022-07-16: 100 mL via INTRAVENOUS

## 2022-07-16 MED ORDER — MUPIROCIN 2 % EX OINT
1.0000 | TOPICAL_OINTMENT | Freq: Two times a day (BID) | CUTANEOUS | Status: DC
  Administered 2022-07-16 – 2022-07-19 (×6): 1 via NASAL
  Filled 2022-07-16 (×2): qty 22

## 2022-07-16 MED ORDER — SODIUM CHLORIDE 0.9 % IV BOLUS
1000.0000 mL | Freq: Once | INTRAVENOUS | Status: AC
  Administered 2022-07-16: 1000 mL via INTRAVENOUS

## 2022-07-16 MED ORDER — SODIUM CHLORIDE 0.9 % IV SOLN
2.0000 g | Freq: Once | INTRAVENOUS | Status: AC
  Administered 2022-07-16: 2 g via INTRAVENOUS
  Filled 2022-07-16: qty 12.5

## 2022-07-16 MED ORDER — SODIUM CHLORIDE 0.9 % IV SOLN
2.0000 g | Freq: Once | INTRAVENOUS | Status: DC
  Filled 2022-07-16: qty 20

## 2022-07-16 MED ORDER — ACETAMINOPHEN 325 MG PO TABS
ORAL_TABLET | ORAL | Status: AC
  Administered 2022-07-16: 1000 mg via ORAL
  Filled 2022-07-16: qty 2

## 2022-07-16 MED ORDER — ACETAMINOPHEN 325 MG PO TABS
650.0000 mg | ORAL_TABLET | Freq: Four times a day (QID) | ORAL | Status: DC | PRN
  Administered 2022-07-17 – 2022-07-19 (×4): 650 mg via ORAL
  Filled 2022-07-16 (×4): qty 2

## 2022-07-16 MED ORDER — ENOXAPARIN SODIUM 60 MG/0.6ML IJ SOSY
0.5000 mg/kg | PREFILLED_SYRINGE | INTRAMUSCULAR | Status: DC
  Administered 2022-07-16 – 2022-07-18 (×3): 57.5 mg via SUBCUTANEOUS
  Filled 2022-07-16 (×3): qty 0.6

## 2022-07-16 MED ORDER — CHLORHEXIDINE GLUCONATE CLOTH 2 % EX PADS
6.0000 | MEDICATED_PAD | Freq: Every day | CUTANEOUS | Status: DC
  Administered 2022-07-17 – 2022-07-19 (×3): 6 via TOPICAL

## 2022-07-16 MED ORDER — LACTATED RINGERS IV BOLUS (SEPSIS)
1000.0000 mL | Freq: Once | INTRAVENOUS | Status: AC
  Administered 2022-07-16: 1000 mL via INTRAVENOUS

## 2022-07-16 MED ORDER — POLYETHYLENE GLYCOL 3350 17 G PO PACK: 17.0000 g | PACK | Freq: Every day | ORAL | Status: DC | PRN

## 2022-07-16 MED ORDER — ONDANSETRON HCL 4 MG/2ML IJ SOLN: 4.0000 mg | Freq: Four times a day (QID) | INTRAMUSCULAR | Status: DC | PRN

## 2022-07-16 MED ORDER — ACETAMINOPHEN 500 MG PO TABS
1000.0000 mg | ORAL_TABLET | Freq: Once | ORAL | Status: AC
  Filled 2022-07-16: qty 2

## 2022-07-16 MED ORDER — CALCIUM CARBONATE ANTACID 500 MG PO CHEW
1.0000 | CHEWABLE_TABLET | Freq: Three times a day (TID) | ORAL | Status: DC | PRN
  Administered 2022-07-16: 200 mg via ORAL
  Filled 2022-07-16: qty 1

## 2022-07-16 MED ORDER — ORAL CARE MOUTH RINSE: 15.0000 mL | OROMUCOSAL | Status: DC | PRN

## 2022-07-16 MED ORDER — CHLORHEXIDINE GLUCONATE CLOTH 2 % EX PADS
6.0000 | MEDICATED_PAD | Freq: Every day | CUTANEOUS | Status: DC
  Administered 2022-07-16: 6 via TOPICAL

## 2022-07-16 MED ORDER — LACTATED RINGERS IV SOLN: INTRAVENOUS | Status: DC

## 2022-07-16 MED ORDER — ONDANSETRON HCL 4 MG PO TABS: 4.0000 mg | ORAL_TABLET | Freq: Four times a day (QID) | ORAL | Status: DC | PRN

## 2022-07-16 MED ORDER — SODIUM CHLORIDE 0.9 % IV SOLN
2.0000 g | INTRAVENOUS | Status: DC
  Administered 2022-07-17 – 2022-07-19 (×3): 2 g via INTRAVENOUS
  Filled 2022-07-16 (×3): qty 20

## 2022-07-16 NOTE — Progress Notes (Signed)
PHARMACY -  BRIEF ANTIBIOTIC NOTE   Pharmacy has received consult(s) for vancomycin and cefepime from an ED provider.  The patient's profile has been reviewed for ht/wt/allergies/indication/available labs.    One time order(s) placed for vancomycin 1,000 mg x 1 and cefepime 2 grams x 1  Further antibiotics/pharmacy consults should be ordered by admitting physician if indicated.                       Thank you,  Elliot Gurney, PharmD, BCPS Clinical Pharmacist  07/16/2022 1:48 PM

## 2022-07-16 NOTE — Sepsis Progress Note (Signed)
Elink monitoring for the code sepsis protocol.  

## 2022-07-16 NOTE — Assessment & Plan Note (Signed)
History of elevated total bilirubin, exacerbated in the setting of sepsis.

## 2022-07-16 NOTE — Assessment & Plan Note (Signed)
In the setting of sepsis and hypovolemia.  - IV fluid resuscitation - Repeat BMP in the a.m. - Avoid nephrotoxic agents

## 2022-07-16 NOTE — H&P (Addendum)
History and Physical    Patient: Brett Boone:[id number] DOB: Mar 04, 1945 DOA: 07/16/2022 DOS: the patient was seen and examined on 07/16/2022 PCP: Brett Limerick, MD  Patient coming from: Home  Chief Complaint:  Chief Complaint  Patient presents with   Abnormal Lab   HPI: Brett Boone is a 77 y.o. male with medical history significant of myelodysplastic syndrome with ring sideroblasts, refractory anemia, who presents to the ED due to fever and abnormal labs.  Brett Boone states that approximately 3 days ago, he began to experience fever, chills, nausea, vomiting and quickly progressing left lower extremity swelling and redness.  He notes that all of his symptoms came on rather quickly.  He denies any chest pain, shortness of breath, diarrhea or abdominal pain.  He notes that he had a root canal last week but has been doing well since then.  He denies any recent trauma, lacerations or known cuts/rashes.  He saw his PCP yesterday and due to abnormal labs, he was told to Boone to the ED today.  ED course: On arrival to the ED, patient was normotensive at 113/56 with heart rate of 102.  He was afebrile at 98.9.  During his time in the ED, patient's blood pressure gradually decreased to as low as 89/58 with some improvement after IV fluids.  Initial workup demonstrated WBC of 19.4, hemoglobin 8.6, MCV 107.1, sodium of 129, glucose 152, BUN 36, creatinine 1.54, total bilirubin of 3.4 and GFR 47.  Lactic acid within normal limits x 2.  INR elevated at 1.6. Chest x-ray obtained that demonstrated borderline cardiomegaly with prominent bilateral interstitial opacities, potentially pulmonary venous congestion.  CT of the tib/fib was obtained that demonstrated circumferential skin thickening and soft tissue swelling of the entire lower leg consistent with cellulitis.  Patient started on vancomycin and cefepime.  TRH contacted for admission.  Review of Systems: As mentioned in the history  of present illness. All other systems reviewed and are negative.  Past Medical History:  Diagnosis Date   Anemia    Heart murmur    Hydrocele    MDS (myelodysplastic syndrome) (HCC)    Rotator cuff tear arthropathy of right shoulder 08/01/2020   Venous stasis dermatitis of both lower extremities    Past Surgical History:  Procedure Laterality Date   CATARACT EXTRACTION W/PHACO Right 03/09/2017   Procedure: CATARACT EXTRACTION PHACO AND INTRAOCULAR LENS PLACEMENT (IOC) RIGHT;  Surgeon: Brett Crane, MD;  Location: [facility];  Service: Ophthalmology;  Laterality: Right;   CATARACT EXTRACTION W/PHACO Left 08/25/2021   Procedure: CATARACT EXTRACTION PHACO AND INTRAOCULAR LENS PLACEMENT (IOC) LEFT;  Surgeon: Brett Crane, MD;  Location: [facility];  Service: Ophthalmology;  Laterality: Left;   CATARACT EXTRACTION W/PHACO Left 10/13/2021   Procedure: CATARACT EXTRACTION PHACO AND INTRAOCULAR LENS PLACEMENT (IOC) LEFT;  Surgeon: Brett Crane, MD;  Location: [facility];  Service: Ophthalmology;  Laterality: Left;  8.12 0:52.0   COLONOSCOPY  2013   normal- cleared for 5 yrs- Dr Brett Boone   COLONOSCOPY WITH PROPOFOL N/A 02/17/2017   Procedure: COLONOSCOPY WITH PROPOFOL;  Surgeon: Brett Reil, MD;  Location: [facility];  Service: Endoscopy;  Laterality: N/A;   COLONOSCOPY WITH PROPOFOL N/A 10/12/2019   Procedure: COLONOSCOPY WITH BIOPSY;  Surgeon: Brett Reil, MD;  Location: [facility];  Service: Endoscopy;  Laterality: N/A;   EPIGASTRIC HERNIA REPAIR N/A 07/06/2014   Procedure: HERNIA REPAIR EPIGASTRIC ADULT;  Surgeon: Brett Noa  Anda Kraft, MD;  Location: [facility];  Service: General;  Laterality: N/A;   HERNIA REPAIR Bilateral    INSERTION OF MESH N/A 07/06/2014   Procedure: INSERTION OF MESH;  Surgeon: Brett Maxin, MD;  Location: [facility];  Service: General;  Laterality: N/A;   IR RADIOLOGIST EVAL & MGMT  11/07/2019   IR  RADIOLOGIST EVAL & MGMT  04/25/2020   IR RADIOLOGIST EVAL & MGMT  12/05/2020   IR RADIOLOGIST EVAL & MGMT  06/30/2021   POLYPECTOMY  02/17/2017   Procedure: POLYPECTOMY;  Surgeon: Brett Reil, MD;  Location: [facility];  Service: Endoscopy;;   POLYPECTOMY N/A 10/12/2019   Procedure: POLYPECTOMY;  Surgeon: Brett Reil, MD;  Location: [facility];  Service: Endoscopy;  Laterality: N/A;   SHOULDER ARTHROSCOPY Left 2008   SINUS EXPLORATION     Social History:  reports that he quit smoking about 4 years ago. His smoking use included cigarettes. He has a 12.50 pack-year smoking history. He has quit using smokeless tobacco. He reports current alcohol use. He reports that he does not use drugs.  Allergies  Allergen Reactions   Amoxicillin-Pot Clavulanate Hives and Nausea And Vomiting    Family History  Problem Relation Age of Onset   Diabetes Father    Heart disease Father 48   Stroke Mother     Prior to Admission medications   Medication Sig Start Date End Date Taking? Authorizing Provider  acetaminophen (TYLENOL) 325 MG tablet Take 650 mg by mouth every 6 (six) hours as needed.    [provider]  Calcium Carb-Cholecalciferol 600-400 MG-UNIT TABS Take by mouth.    [provider]  Darbepoetin Alfa (ARANESP) 25 MCG/0.42ML SOSY injection Inject 25 mcg into the skin.    [provider]  ibuprofen (ADVIL) 200 MG tablet Take 200 mg by mouth every 6 (six) hours as needed.    [provider]  meloxicam (MOBIC) 15 MG tablet Take 1 tablet (15 mg total) by mouth daily. 07/15/22   Brett Lipps, PA  mupirocin ointment (BACTROBAN) 2 % Apply 1 Application topically 2 (two) times daily. 12/12/21   Brett Limerick, MD  sulfamethoxazole-trimethoprim (BACTRIM DS) 800-160 MG tablet Take 1 tablet by mouth 2 (two) times daily for 5 days. 07/15/22 07/20/22  Brett Lipps, PA  tadalafil (CIALIS) 5 MG tablet TAKE ONE TABLET BY MOUTH DAILY  07/09/21   Brett Come, MD    Physical Exam: Vitals:   07/16/22 1600 07/16/22 1615 07/16/22 1630 07/16/22 1645  BP: (!) 94/58 98/64 99/60  105/76  Pulse: (!) 52 88 90 89  Resp: 17 20 20 15   Temp:      TempSrc:      SpO2: 98% 97% 98% 97%  Weight:      Height:       Physical Exam Vitals and nursing note reviewed.  Constitutional:      Appearance: He is obese. He is not toxic-appearing.  HENT:     Head: Normocephalic and atraumatic.     Mouth/Throat:     Mouth: Mucous membranes are moist.     Pharynx: Oropharynx is clear.     Comments: Right back molar at the site of root canal with no surrounding erythema or drainage.  Eyes:     General: Scleral icterus (Mild) present.     Conjunctiva/sclera: Conjunctivae normal.     Pupils: Pupils are equal, round, and reactive to light.  Cardiovascular:     Rate and Rhythm:  Tachycardia present. Rhythm irregularly irregular.     Pulses: Normal pulses.     Heart sounds: Murmur (3/6 systolic murmur heard best at the RUSB) heard.  Pulmonary:     Effort: Pulmonary effort is normal. No respiratory distress.     Breath sounds: Normal breath sounds. No wheezing, rhonchi or rales.  Abdominal:     General: Bowel sounds are normal. There is no distension.     Palpations: Abdomen is soft.     Tenderness: There is no abdominal tenderness. There is no guarding.  Musculoskeletal:     Right lower leg: No edema.     Left lower leg: Edema (+1 pitting) present.  Skin:    General: Skin is warm and dry.     Comments: The entirety of the left lower extremity with uniform erythema and edema with increased warmth and tenderness to touch. No petechiae noted.   Facial telangiectasia noted  Neurological:     General: No focal deficit present.     Mental Status: He is alert and oriented to person, place, and time. Mental status is at baseline.  Psychiatric:        Mood and Affect: Mood normal.        Behavior: Behavior normal.        Thought Content:  Thought content normal.        Judgment: Judgment normal.    Data Reviewed: CBC with WBC 9.4, hemoglobin 8.6, MCV of 107, and platelets of 275.  CBC obtained yesterday with WBC of 41.5, hemoglobin 9.5. CMP with sodium of 129, potassium 4.0, bicarb 24, glucose 152, BUN 36, creatinine 1.54, AST 21, ALT 16, total bilirubin 3.4 and GFR 47 Lactic acid within normal limits at 1.6 and 1.7 INR elevated at 1.6 ESR elevated 67 D-dimer obtained yesterday 0.53 Influenza and COVID-19 PCR obtained yesterday were negative  Lower extremity Dopplers obtained yesterday are negative  EKG personally reviewed.  Irregular rhythm with narrow complex with intermittent P waves that are morphologically different.  Most consistent with multi-focal atrial tachycardia. Rate 111.  It appears prior EKG obtained in August 2023 had similar irregularity but with improved rates.  CT TIBIA FIBULA LEFT W CONTRAST  Result Date: 07/16/2022 CLINICAL DATA:  Left lower leg redness and swelling. EXAM: CT OF THE LOWER LEFT EXTREMITY WITH CONTRAST TECHNIQUE: Multidetector CT imaging of the lower left extremity was performed according to the standard protocol following intravenous contrast administration. RADIATION DOSE REDUCTION: This exam was performed according to the departmental dose-optimization program which includes automated exposure control, adjustment of the mA and/or kV according to patient size and/or use of iterative reconstruction technique. CONTRAST:  OMNIPAQUE IOHEXOL 300 MG/ML  SOLN COMPARISON:  Left tibia and fibula x-rays from same day. FINDINGS: Bones/Joint/Cartilage No fracture or dislocation. Degenerative changes of the left knee and ankle. No joint effusion. Ligaments Ligaments are suboptimally evaluated by CT. Muscles and Tendons Grossly intact. Soft tissue Circumferential skin thickening and soft tissue swelling of the entire lower leg. No fluid collection or subcutaneous emphysema. No soft tissue mass.  IMPRESSION: 1. Circumferential skin thickening and soft tissue swelling of the entire lower leg, consistent with cellulitis. No abscess or subcutaneous emphysema. Electronically Signed   By: Obie Dredge M.D.   On: 07/16/2022 15:33   DG Chest Port 1 View  Result Date: 07/16/2022 CLINICAL DATA:  Questionable sepsis - evaluate for abnormality EXAM: PORTABLE CHEST 1 VIEW COMPARISON:  CXR 12/31/16 FINDINGS: No pleural effusion. No pneumothorax. Borderline cardiomegaly. No focal  airspace opacity. There are prominent bilateral interstitial opacities could represent pulmonary venous congestion. No radiographically apparent displaced rib fractures. Visualized upper abdomen is unremarkable. IMPRESSION: Borderline cardiomegaly with prominent bilateral interstitial opacities, which could represent pulmonary venous congestion. Electronically Signed   By: Lorenza Cambridge M.D.   On: 07/16/2022 14:19   DG Tibia/Fibula Left  Result Date: 07/16/2022 CLINICAL DATA:  infection EXAM: LEFT TIBIA AND FIBULA - 2 VIEW COMPARISON:  None Available. FINDINGS: No cortical erosion or destruction. There is no evidence of fracture or other focal bone lesions. Moderate tricompartmental degenerative changes of the knee. Subcutaneus soft tissue edema. Dermal/vascular calcifications. IMPRESSION: 1. No acute displaced fracture or dislocation. 2. No radiographic findings to suggest osteomyelitis. Limited evaluation due to overlapping osseous structures and overlying soft tissues. If high clinical concern, please consider MRI for further evaluation (with intravenous contrast if GFR greater than 30). Electronically Signed   By: Tish Frederickson M.D.   On: 07/16/2022 14:13    Results are pending, will review when available.  Assessment and Plan:  * Severe sepsis (HCC) Patient is presenting with tachycardia, hypotension and fevers at home with notably elevated WBC in the setting of severe cellulitis.  Given hypotension and coagulopathy, meets  criteria for severe sepsis.  Surprisingly, lactic acid is within normal limits.  - Admit to stepdown given hypotension - Continue IV fluid resuscitation - No indication for pressor support at this time, will start if MAPs are sustaining below 65 - Blood cultures pending - Check TTE given systolic murmur seems worse  Cellulitis Sudden onset left lower extremity erythema and swelling with increased tenderness and warmth to touch.  DVT study negative.  No evidence of purulence or ulceration.  - Blood cultures pending - Discontinue vancomycin - Transition cefepime to ceftriaxone  AKI (acute kidney injury) (HCC) In the setting of sepsis and hypovolemia.  - IV fluid resuscitation - Repeat BMP in the a.m. - Avoid nephrotoxic agents  Hyponatremia Mild hyponatremia noted.  I suspect this will improve with IV fluid resuscitation.  - Recheck sodium in the a.m.  Hyperglycemia Likely reactive at this time, however previous readings were also elevated.  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   - Telemetry monitoring  Elevated bilirubin History of elevated total bilirubin, exacerbated in the setting of sepsis.  Refractory anemia with ring sideroblasts (HCC) Hemoglobin stable at this time.  - Daily CBC - Transfuse for hemoglobin less than 7  Advance Care Planning:   Code Status: Full Code verified by patient with family at bedside  Consults: None  Family Communication: Patient wife and daughter updated at bedside  Severity of Illness: The appropriate patient status for this patient is INPATIENT. Inpatient status is judged to be reasonable and necessary in order to provide the required intensity of service to ensure the patient's safety. The patient's presenting symptoms, physical exam findings, and initial  radiographic and laboratory data in the context of their chronic comorbidities is felt to place them at high risk for further clinical deterioration. Furthermore, it is not anticipated that the patient will be medically stable for discharge from the hospital within 2 midnights of admission.   * I certify that at the point of admission it is my clinical judgment that the patient will require inpatient hospital care spanning beyond 2 midnights from the point of  admission due to high intensity of service, high risk for further deterioration and high frequency of surveillance required.*  Author: Verdene Lennert, MD 07/16/2022 5:32 PM  For on call review [email or web address].

## 2022-07-16 NOTE — Progress Notes (Addendum)
1920 patient alert x4 on room air sitting to side of bed doppler used on left foot patient states its painful if you press to hard for pulse check.  2000 all meds given as ordered patient using urinal as needed 2200 patient in and out of AFIB  07/17/22  0330 -  12 Lead done shows NSR with PVCs

## 2022-07-16 NOTE — Progress Notes (Signed)
Noted  KP 

## 2022-07-16 NOTE — Assessment & Plan Note (Signed)
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   - Telemetry monitoring

## 2022-07-16 NOTE — Assessment & Plan Note (Signed)
Likely reactive at this time, however previous readings were also elevated.  - A1c pending

## 2022-07-16 NOTE — Assessment & Plan Note (Signed)
Mild hyponatremia noted.  I suspect this will improve with IV fluid resuscitation.  - Recheck sodium in the a.m.

## 2022-07-16 NOTE — ED Provider Notes (Signed)
[facility] Provider Note    Event Date/Time   First MD Initiated Contact with Patient 07/16/22 1257     (approximate)   History   Abnormal Lab   HPI  Brett Boone is a 77 y.o. male past medical history significant for myelodysplastic syndrome, heart murmur, history of venous stasis to the lower extremities, who presents to the emergency department not feeling well with fever, vomiting and significant pain to his left lower leg.  Patient an ultrasound done as an outpatient yesterday no concern for DVT.  Patient had an episode all night over Wednesday night where he was up with significant vomiting, sweating and diaphoresis with a fever up to 101.  Evaluated yesterday at primary care physician office.  Significant pain to the left lower leg.  Denies any wounds or injury.  Denies any history of DVT or PE.  Not on anticoagulation.  Dental work done on Thursday.  States that he has a known history of a murmur.     Physical Exam   Triage Vital Signs: ED Triage Vitals  Enc Vitals Group     BP 07/16/22 1151 (!) 113/56     Pulse Rate 07/16/22 1151 (!) 102     Resp 07/16/22 1151 19     Temp 07/16/22 1151 98.8 F (37.1 C)     Temp Source 07/16/22 1151 Oral     SpO2 07/16/22 1151 100 %     Weight 07/16/22 1157 250 lb (113.4 kg)     Height 07/16/22 1157 5\' 10"  (1.778 m)     Head Circumference --      Peak Flow --      Pain Score 07/16/22 1156 2     Pain Loc --      Pain Edu? --      Excl. in GC? --     Most recent vital signs: Vitals:   07/16/22 1530 07/16/22 1545  BP:    Pulse: 94 (!) 104  Resp: 18 18  Temp:  99.2 F (37.3 C)  SpO2: 91% 94%    Physical Exam Constitutional:      Appearance: He is well-developed.  HENT:     Head: Atraumatic.  Eyes:     Conjunctiva/sclera: Conjunctivae normal.  Cardiovascular:     Rate and Rhythm: Regular rhythm. Tachycardia present.     Heart sounds: Murmur heard.  Pulmonary:     Effort: No  respiratory distress.  Abdominal:     Tenderness: There is no abdominal tenderness.  Musculoskeletal:     Cervical back: Normal range of motion.     Left lower leg: Edema present.     Comments: Left lower extremity with significant tenderness to palpation, erythema, warmth and induration and swelling when compared to the right lower extremity.  No crepitus.  No obvious fluctuance.  +2 DP pulses.  No obvious wounds or injury.  Skin:    General: Skin is warm.     Capillary Refill: Capillary refill takes 2 to 3 seconds.  Neurological:     Mental Status: He is alert. Mental status is at baseline.  Psychiatric:        Mood and Affect: Mood normal.     IMPRESSION / MDM / ASSESSMENT AND PLAN / ED COURSE  I reviewed the triage vital signs and the nursing notes.  On arrival to the emergency department patient tachycardic and ill-appearing.  Differential diagnosis concerning for sepsis.  Leukocytosis on review of outside records from  yesterday.  Had a white count up to 40.  Patient also had an ultrasound of his left lower leg that did not show any signs of a DVT.  Differential diagnosis including sepsis, concern for cellulitis, endocarditis or necrotizing soft tissue infection  On arrival blood cultures obtained.  Given that he is normotensive felt that 30 cc/kg of IV fluids may be detrimental, will give 1 L of IV fluids and reevaluate  Atrial fibrillation with a rapid rate while on cardiac telemetry.  RADIOLOGY I independently reviewed imaging, my interpretation of imaging: X-ray of the left leg with no obvious signs of gas.  Obvious edema  CT scan of the left lower leg without findings of necrotizing soft tissue infection.  No abscess  LABS (all labs ordered are listed, but only abnormal results are displayed) Labs interpreted as -    Labs Reviewed  CBC WITH DIFFERENTIAL/PLATELET - Abnormal; Notable for the following components:      Result Value   WBC 19.4 (*)    RBC 2.55 (*)     Hemoglobin 8.6 (*)    HCT 27.3 (*)    MCV 107.1 (*)    RDW 23.3 (*)    Neutro Abs 17.3 (*)    Monocytes Absolute 1.2 (*)    Abs Immature Granulocytes 0.09 (*)    All other components within normal limits  BASIC METABOLIC PANEL - Abnormal; Notable for the following components:   Sodium 129 (*)    Glucose, Bld 152 (*)    BUN 36 (*)    Creatinine, Ser 1.54 (*)    Calcium 8.2 (*)    GFR, Estimated 47 (*)    All other components within normal limits  HEPATIC FUNCTION PANEL - Abnormal; Notable for the following components:   Total Bilirubin 3.4 (*)    Bilirubin, Direct 0.4 (*)    Indirect Bilirubin 3.0 (*)    All other components within normal limits  SEDIMENTATION RATE - Abnormal; Notable for the following components:   Sed Rate 67 (*)    All other components within normal limits  PROTIME-INR - Abnormal; Notable for the following components:   Prothrombin Time 19.1 (*)    INR 1.6 (*)    All other components within normal limits  APTT - Abnormal; Notable for the following components:   aPTT 45 (*)    All other components within normal limits  CULTURE, BLOOD (ROUTINE X 2)  CULTURE, BLOOD (ROUTINE X 2)  LACTIC ACID, PLASMA  LACTIC ACID, PLASMA  LIPASE, BLOOD  URINALYSIS, W/ REFLEX TO CULTURE (INFECTION SUSPECTED)  C-REACTIVE PROTEIN  TYPE AND SCREEN     MDM    On chart review from yesterday patient had leukocytosis up to 40.  Today patient has leukocytosis of 20.  Acute kidney injury with an elevated creatinine up to 1.5.  Initial lactic acid 1.6 and within normal limits.  Elevated acute inflammatory markers.  Hyponatremia.  Patient was started on broad-spectrum antibiotics to cover for cellulitis that is severe to the left lower leg.  Started on vancomycin and cefepime  On reevaluation patient with worsening hypotension.  Given another 1 L of IV fluids and will reevaluate, started on maintenance LR  Continues to have a downtrending blood pressure, given a 3rd L of IV  fluids.  Consulted ICU for admission for sepsis secondary to cellulitis of the left lower leg  PROCEDURES:  Critical Care performed: yes  .Critical Care  Performed by: Corena Herter, MD Authorized by: Corena Herter, MD  Critical care provider statement:    Critical care time (minutes):  45   Critical care time was exclusive of:  Separately billable procedures and treating other patients   Critical care was necessary to treat or prevent imminent or life-threatening deterioration of the following conditions:  Sepsis and shock   Critical care was time spent personally by me on the following activities:  Development of treatment plan with patient or surrogate, discussions with consultants, evaluation of patient's response to treatment, examination of patient, ordering and review of laboratory studies, ordering and review of radiographic studies, ordering and performing treatments and interventions, pulse oximetry, re-evaluation of patient's condition and review of old charts   Patient's presentation is most consistent with acute presentation with potential threat to life or bodily function.   MEDICATIONS ORDERED IN ED: Medications  lactated ringers infusion (0 mLs Intravenous Paused 07/16/22 1542)  fentaNYL (SUBLIMAZE) injection 50 mcg (has no administration in time range)  norepinephrine (LEVOPHED) 4mg  in (0.016 mg/mL) premix infusion (has no administration in time range)  lactated ringers bolus 1,000 mL (1,000 mLs Intravenous New Bag/Given 07/16/22 1400)  vancomycin (VANCOCIN) IVPB 1000 mg/200 mL premix (1,000 mg Intravenous New Bag/Given 07/16/22 1455)  acetaminophen (TYLENOL) tablet 1,000 mg (1,000 mg Oral Given 07/16/22 1401)  ceFEPIme (MAXIPIME) 2 g in sodium chloride 0.9 % 100 mL IVPB (0 g Intravenous Stopped 07/16/22 1433)  iohexol (OMNIPAQUE) 300 MG/ML solution 100 mL (100 mLs Intravenous Contrast Given 07/16/22 1506)  sodium chloride 0.9 % bolus 1,000 mL (0 mLs Intravenous  Stopped 07/16/22 1506)  sodium chloride 0.9 % bolus 1,000 mL (1,000 mLs Intravenous New Bag/Given 07/16/22 1549)    FINAL CLINICAL IMPRESSION(S) / ED DIAGNOSES   Final diagnoses:  Sepsis, due to unspecified organism, unspecified whether acute organ dysfunction present (HCC)  Cellulitis of left lower extremity     Rx / DC Orders   ED Discharge Orders     None        Note:  This document was prepared using Dragon voice recognition software and may include unintentional dictation errors.   Corena Herter, MD 07/16/22 [phone]

## 2022-07-16 NOTE — Consult Note (Signed)
   NAME:  Brett Boone, MRN:  [id number], DOB:  05-Jan-1946, LOS: 0 ADMISSION DATE:  07/16/2022, CONSULTATION DATE:  07/16/2022 REFERRING MD:  Corena Herter MD , CHIEF COMPLAINT:   Sepsis, cellulitis  History of Present Illness:   77 year old with past medical history of myelodysplastic syndrome, heart murmur, chronic venous stasis who recently had a dental procedure presenting with fever, vomiting, left lower leg pain.  Ultrasound as an outpatient did not show DVT.  Came in with fevers of 101 and hypotension requiring IV fluids.  PCCM called for assessment.  Pertinent  Medical History    has a past medical history of Anemia, Heart murmur, Hydrocele, MDS (myelodysplastic syndrome) (HCC), Rotator cuff tear arthropathy of right shoulder (08/01/2020), and Venous stasis dermatitis of both lower extremities.   Significant Hospital Events: Including procedures, antibiotic start and stop dates in addition to other pertinent events     Interim History / Subjective:     Objective   Blood pressure (!) 89/58, pulse (!) 104, temperature 99.2 F (37.3 C), temperature source Oral, resp. rate 18, height 5\' 10"  (1.778 m), weight 113.4 kg, SpO2 94 %.        Intake/Output Summary (Last 24 hours) at 07/16/2022 1612 Last data filed at 07/16/2022 1506 Gross per 24 hour  Intake 1100 ml  Output --  Net 1100 ml   Filed Weights   07/16/22 1157  Weight: 113.4 kg    Examination: Blood pressure 105/76, pulse 89, temperature 99.2 F (37.3 C), temperature source Oral, resp. rate 15, height 5\' 10"  (1.778 m), weight 113.4 kg, SpO2 97 %. Gen:      No acute distress HEENT:  EOMI, sclera anicteric Neck:     No masses; no thyromegaly Lungs:    Clear to auscultation bilaterally; normal respiratory effort CV:         Regular rate and rhythm; no murmurs Abd:      + bowel sounds; soft, non-tender; no palpable masses, no distension Ext:    Left leg swelling, erythema.  Chronic stasis dermatitis. Skin:       Warm and dry; no rash Neuro: alert and oriented x 3 Psych: normal mood and affect   Labs/imaging reviewed Significant for sodium 129, BUN/creatinine 36/1.54, total bilirubin 3.4 Lactic acid 1.7 WBC 19.4, hemoglobin 8.6, platelets 275  Resolved Hospital Problem list     Assessment & Plan:  Severe sepsis secondary to cellulitis Hypotensive on admission but BP is responding to IV fluids and does not need pressors Lactic acid is normal No indication for ICU admission Continue broad antibiotic coverage, cultures Check echocardiogram  AKI Follow urine output and creatinine  PCCM will be available as needed.  Please call with any questions.  Best Practice (right click and "Reselect all SmartList Selections" daily)   Per primary team  Critical care time: NA   Chilton Greathouse MD [facility] See Amion for pager  If no response to pager , please call [phone] until 7pm After 7:00 pm call Elink  [phone] 07/16/2022, 5:41 PM

## 2022-07-16 NOTE — ED Notes (Signed)
ED TO INPATIENT HANDOFF REPORT  ED Nurse Name and Phone #: Ned Clines Name/Age/Gender Brett Boone 77 y.o. male Room/Bed: ED15A/ED15A  Code Status   Code Status: Full Code  Home/SNF/Other Home Patient oriented to: self, place, time, and situation Is this baseline? Yes   Triage Complete: Triage complete  Chief Complaint Severe sepsis (HCC) [A41.9, R65.20]  Triage Note Pt sent to ED per PCP d/t abnormal labs and swelling and redness to his left leg. Pt reports this has been going on for 2 days with n/v and fever at home. Pt also had a recent root canal last week.   Allergies Allergies  Allergen Reactions   Amoxicillin-Pot Clavulanate Hives and Nausea And Vomiting    Level of Care/Admitting Diagnosis ED Disposition     ED Disposition  Admit   Condition  --   Comment  Hospital Area: [facility] [[id number]]  Level of Care: Stepdown [14]  Covid Evaluation: Confirmed COVID Negative  Diagnosis: Severe sepsis [facility]) [[id number]]  Admitting Physician: Verdene Lennert [[id number]]  Attending Physician: Verdene Lennert [phone]  Certification:: I certify this patient will need inpatient services for at least 2 midnights  Estimated Length of Stay: 3          B Medical/Surgery History Past Medical History:  Diagnosis Date   Anemia    Heart murmur    Hydrocele    MDS (myelodysplastic syndrome) (HCC)    Rotator cuff tear arthropathy of right shoulder 08/01/2020   Venous stasis dermatitis of both lower extremities    Past Surgical History:  Procedure Laterality Date   CATARACT EXTRACTION W/PHACO Right 03/09/2017   Procedure: CATARACT EXTRACTION PHACO AND INTRAOCULAR LENS PLACEMENT (IOC) RIGHT;  Surgeon: Nevada Crane, MD;  Location: [facility];  Service: Ophthalmology;  Laterality: Right;   CATARACT EXTRACTION W/PHACO Left 08/25/2021   Procedure: CATARACT EXTRACTION PHACO AND INTRAOCULAR LENS PLACEMENT (IOC) LEFT;  Surgeon: Nevada Crane, MD;  Location: [facility];  Service: Ophthalmology;  Laterality: Left;   CATARACT EXTRACTION W/PHACO Left 10/13/2021   Procedure: CATARACT EXTRACTION PHACO AND INTRAOCULAR LENS PLACEMENT (IOC) LEFT;  Surgeon: Nevada Crane, MD;  Location: [facility];  Service: Ophthalmology;  Laterality: Left;  8.12 0:52.0   COLONOSCOPY  2013   normal- cleared for 5 yrs- Dr Theodoro Parma   COLONOSCOPY WITH PROPOFOL N/A 02/17/2017   Procedure: COLONOSCOPY WITH PROPOFOL;  Surgeon: Toney Reil, MD;  Location: [facility];  Service: Endoscopy;  Laterality: N/A;   COLONOSCOPY WITH PROPOFOL N/A 10/12/2019   Procedure: COLONOSCOPY WITH BIOPSY;  Surgeon: Toney Reil, MD;  Location: [facility];  Service: Endoscopy;  Laterality: N/A;   EPIGASTRIC HERNIA REPAIR N/A 07/06/2014   Procedure: HERNIA REPAIR EPIGASTRIC ADULT;  Surgeon: Duwaine Maxin, MD;  Location: [facility];  Service: General;  Laterality: N/A;   HERNIA REPAIR Bilateral    INSERTION OF MESH N/A 07/06/2014   Procedure: INSERTION OF MESH;  Surgeon: Duwaine Maxin, MD;  Location: [facility];  Service: General;  Laterality: N/A;   IR RADIOLOGIST EVAL & MGMT  11/07/2019   IR RADIOLOGIST EVAL & MGMT  04/25/2020   IR RADIOLOGIST EVAL & MGMT  12/05/2020   IR RADIOLOGIST EVAL & MGMT  06/30/2021   POLYPECTOMY  02/17/2017   Procedure: POLYPECTOMY;  Surgeon: Toney Reil, MD;  Location: [facility];  Service: Endoscopy;;   POLYPECTOMY N/A 10/12/2019   Procedure: POLYPECTOMY;  Surgeon: Toney Reil, MD;  Location:  [facility];  Service: Endoscopy;  Laterality: N/A;   SHOULDER ARTHROSCOPY Left 2008   SINUS EXPLORATION       A IV Location/Drains/Wounds Patient Lines/Drains/Airways Status     Active Line/Drains/Airways     Name Placement date Placement time Site Days   Peripheral IV 07/16/22 20 G Anterior;Proximal;Right Forearm 07/16/22  1320  Forearm  less than 1   Peripheral  IV 07/16/22 20 G Left Antecubital 07/16/22  1335  Antecubital  less than 1            Intake/Output Last 24 hours  Intake/Output Summary (Last 24 hours) at 07/16/2022 1713 Last data filed at 07/16/2022 1622 Gross per 24 hour  Intake 2297.43 ml  Output --  Net 2297.43 ml    Labs/Imaging Results for orders placed or performed during the hospital encounter of 07/16/22 (from the past 48 hour(s))  CBC with Differential     Status: Abnormal   Collection Time: 07/16/22 11:59 AM  Result Value Ref Range   WBC 19.4 (H) 4.0 - 10.5 K/uL   RBC 2.55 (L) 4.22 - 5.81 MIL/uL   Hemoglobin 8.6 (L) 13.0 - 17.0 g/dL   HCT 16.1 (L) 09.6 - 04.5 %   MCV 107.1 (H) 80.0 - 100.0 fL   MCH 33.7 26.0 - 34.0 pg   MCHC 31.5 30.0 - 36.0 g/dL   RDW 40.9 (H) 81.1 - 91.4 %   Platelets 275 150 - 400 K/uL   nRBC 0.2 0.0 - 0.2 %   Neutrophils Relative % 89 %   Neutro Abs 17.3 (H) 1.7 - 7.7 K/uL   Lymphocytes Relative 4 %   Lymphs Abs 0.8 0.7 - 4.0 K/uL   Monocytes Relative 6 %   Monocytes Absolute 1.2 (H) 0.1 - 1.0 K/uL   Eosinophils Relative 0 %   Eosinophils Absolute 0.0 0.0 - 0.5 K/uL   Basophils Relative 0 %   Basophils Absolute 0.1 0.0 - 0.1 K/uL   WBC Morphology TOXIC GRANULATION    RBC Morphology MIXED RBC POP    Smear Review Normal platelet morphology    Immature Granulocytes 1 %   Abs Immature Granulocytes 0.09 (H) 0.00 - 0.07 K/uL    Comment: Performed at [facility], [address] [place], [place] [address]  Basic metabolic panel     Status: Abnormal   Collection Time: 07/16/22 11:59 AM  Result Value Ref Range   Sodium 129 (L) 135 - 145 mmol/L   Potassium 4.0 3.5 - 5.1 mmol/L   Chloride 98 98 - 111 mmol/L   CO2 24 22 - 32 mmol/L   Glucose, Bld 152 (H) 70 - 99 mg/dL    Comment: Glucose reference range applies only to samples taken after fasting for at least 8 hours.   BUN 36 (H) 8 - 23 mg/dL   Creatinine, Ser 6.21 (H) 0.61 - 1.24 mg/dL   Calcium 8.2 (L) 8.9 - 10.3 mg/dL    GFR, Estimated 47 (L) >60 mL/min    Comment: (NOTE) Calculated using the CKD-EPI Creatinine Equation (2021)    Anion gap 7 5 - 15    Comment: Performed at [facility], [address] [place], [place] [address]  Lactic acid, plasma     Status: None   Collection Time: 07/16/22 11:59 AM  Result Value Ref Range   Lactic Acid, Venous 1.6 0.5 - 1.9 mmol/L    Comment: Performed at [facility], [address], [place], [place] [address]  Hepatic function panel     Status: Abnormal   Collection Time: 07/16/22 11:59 AM  Result Value Ref Range   Total Protein 6.6 6.5 - 8.1 g/dL   Albumin 3.9 3.5 - 5.0 g/dL   AST 21 15 - 41 U/L   ALT 16 0 - 44 U/L   Alkaline Phosphatase 42 38 - 126 U/L   Total Bilirubin 3.4 (H) 0.3 - 1.2 mg/dL   Bilirubin, Direct 0.4 (H) 0.0 - 0.2 mg/dL   Indirect Bilirubin 3.0 (H) 0.3 - 0.9 mg/dL    Comment: Performed at [facility], [address] [place], [place] [address]  Lipase, blood     Status: None   Collection Time: 07/16/22 11:59 AM  Result Value Ref Range   Lipase 30 11 - 51 U/L    Comment: Performed at [facility], [address] [place], [place] [address]  Sedimentation rate     Status: Abnormal   Collection Time: 07/16/22 11:59 AM  Result Value Ref Range   Sed Rate 67 (H) 0 - 20 mm/hr    Comment: Performed at [facility], [address] [place], [place] [address]  Lactic acid, plasma     Status: None   Collection Time: 07/16/22  1:43 PM  Result Value Ref Range   Lactic Acid, Venous 1.7 0.5 - 1.9 mmol/L    Comment: Performed at [facility], [address] [place], [place] [address]  Protime-INR     Status: Abnormal   Collection Time: 07/16/22  1:43 PM  Result Value Ref Range   Prothrombin Time 19.1 (H) 11.4 - 15.2 seconds   INR 1.6 (H) 0.8 - 1.2    Comment: (NOTE) INR goal varies based on device and disease states. Performed at [facility], [address]  [place], [place] [address]   APTT     Status: Abnormal   Collection Time: 07/16/22  1:43 PM  Result Value Ref Range   aPTT 45 (H) 24 - 36 seconds    Comment:        IF BASELINE aPTT IS ELEVATED, SUGGEST PATIENT RISK ASSESSMENT BE USED TO DETERMINE APPROPRIATE ANTICOAGULANT THERAPY. Performed at [facility], [address] [place], [place] [address]   Type and screen [facility]     Status: None   Collection Time: 07/16/22  1:54 PM  Result Value Ref Range   ABO/RH(D) O POS    Antibody Screen NEG    Sample Expiration      07/19/2022,2359 Performed at [facility], [address] [place], [place] [address]    CT TIBIA FIBULA LEFT W CONTRAST  Result Date: 07/16/2022 CLINICAL DATA:  Left lower leg redness and swelling. EXAM: CT OF THE LOWER LEFT EXTREMITY WITH CONTRAST TECHNIQUE: Multidetector CT imaging of the lower left extremity was performed according to the standard protocol following intravenous contrast administration. RADIATION DOSE REDUCTION: This exam was performed according to the departmental dose-optimization program which includes automated exposure control, adjustment of the mA and/or kV according to patient size and/or use of iterative reconstruction technique. CONTRAST:  OMNIPAQUE IOHEXOL 300 MG/ML  SOLN COMPARISON:  Left tibia and fibula x-rays from same day. FINDINGS: Bones/Joint/Cartilage No fracture or dislocation. Degenerative changes of the left knee and ankle. No joint effusion. Ligaments Ligaments are suboptimally evaluated by CT. Muscles and Tendons Grossly intact. Soft tissue Circumferential skin thickening and soft tissue swelling of the entire lower leg. No fluid collection or subcutaneous  emphysema. No soft tissue mass. IMPRESSION: 1. Circumferential skin thickening and soft tissue swelling of the entire lower leg, consistent with cellulitis. No abscess or subcutaneous emphysema. Electronically Signed   By: Obie Dredge  M.D.   On: 07/16/2022 15:33   DG Chest Port 1 View  Result Date: 07/16/2022 CLINICAL DATA:  Questionable sepsis - evaluate for abnormality EXAM: PORTABLE CHEST 1 VIEW COMPARISON:  CXR 12/31/16 FINDINGS: No pleural effusion. No pneumothorax. Borderline cardiomegaly. No focal airspace opacity. There are prominent bilateral interstitial opacities could represent pulmonary venous congestion. No radiographically apparent displaced rib fractures. Visualized upper abdomen is unremarkable. IMPRESSION: Borderline cardiomegaly with prominent bilateral interstitial opacities, which could represent pulmonary venous congestion. Electronically Signed   By: Lorenza Cambridge M.D.   On: 07/16/2022 14:19   DG Tibia/Fibula Left  Result Date: 07/16/2022 CLINICAL DATA:  infection EXAM: LEFT TIBIA AND FIBULA - 2 VIEW COMPARISON:  None Available. FINDINGS: No cortical erosion or destruction. There is no evidence of fracture or other focal bone lesions. Moderate tricompartmental degenerative changes of the knee. Subcutaneus soft tissue edema. Dermal/vascular calcifications. IMPRESSION: 1. No acute displaced fracture or dislocation. 2. No radiographic findings to suggest osteomyelitis. Limited evaluation due to overlapping osseous structures and overlying soft tissues. If high clinical concern, please consider MRI for further evaluation (with intravenous contrast if GFR greater than 30). Electronically Signed   By: Tish Frederickson M.D.   On: 07/16/2022 14:13   US Venous Img Lower Unilateral Left (DVT)  Result Date: 07/15/2022 CLINICAL DATA:  Left lower extremity pain and edema. Evaluate for DVT. EXAM: LEFT LOWER EXTREMITY VENOUS DOPPLER ULTRASOUND TECHNIQUE: Gray-scale sonography with graded compression, as well as color Doppler and duplex ultrasound were performed to evaluate the lower extremity deep venous systems from the level of the common femoral vein and including the common femoral, femoral, profunda femoral, popliteal and  calf veins including the posterior tibial, peroneal and gastrocnemius veins when visible. The superficial great saphenous vein was also interrogated. Spectral Doppler was utilized to evaluate flow at rest and with distal augmentation maneuvers in the common femoral, femoral and popliteal veins. COMPARISON:  None Available. FINDINGS: Contralateral Common Femoral Vein: Respiratory phasicity is normal and symmetric with the symptomatic side. No evidence of thrombus. Normal compressibility. Common Femoral Vein: No evidence of thrombus. Normal compressibility, respiratory phasicity and response to augmentation. Saphenofemoral Junction: No evidence of thrombus. Normal compressibility and flow on color Doppler imaging. Profunda Femoral Vein: No evidence of thrombus. Normal compressibility and flow on color Doppler imaging. Femoral Vein: No evidence of thrombus. Normal compressibility, respiratory phasicity and response to augmentation. Popliteal Vein: No evidence of thrombus. Normal compressibility, respiratory phasicity and response to augmentation. Calf Veins: No evidence of thrombus. Normal compressibility and flow on color Doppler imaging. Superficial Great Saphenous Vein: No evidence of thrombus. Normal compressibility. Other Findings: Note is made of a prominent though non pathologically enlarged left inguinal lymph node. The lymph node is not enlarged by size criteria measuring 1.2 cm in greatest short axis diameter and maintains a benign fatty hilum without cortical thickening (image 34), presumably reactive in etiology. Note is made of a mildly prominent though widely patent superficial varicosity at the level of the left lower leg and calf (images 39 through 42), without evidence superficial thrombophlebitis. IMPRESSION: 1. No evidence of DVT within the left lower extremity. 2. Mildly prominent though widely patent superficial varicosity at the level of the left lower leg calf. No evidence of superficial  thrombophlebitis. Electronically Signed  By: Simonne Come M.D.   On: 07/15/2022 14:00    Pending Labs Unresulted Labs (From admission, onward)     Start     Ordered   07/17/22 0500  Protime-INR  Tomorrow morning,   R        07/16/22 1706   07/17/22 0500  Comprehensive metabolic panel  Tomorrow morning,   R        07/16/22 1706   07/17/22 0500  CBC with Differential/Platelet  Tomorrow morning,   R        07/16/22 1706   07/16/22 1343  Blood Culture (routine x 2)  (Septic presentation on arrival (screening labs, nursing and treatment orders for obvious sepsis))  BLOOD CULTURE X 2,   STAT      07/16/22 1344   07/16/22 1343  Urinalysis, w/ Reflex to Culture (Infection Suspected) -Urine, Clean Catch  (Septic presentation on arrival (screening labs, nursing and treatment orders for obvious sepsis))  Once,   URGENT       Question:  Specimen Source  Answer:  Urine, Clean Catch   07/16/22 1344            Vitals/Pain Today's Vitals   07/16/22 1600 07/16/22 1615 07/16/22 1630 07/16/22 1645  BP: (!) 94/58 98/64 99/60  105/76  Pulse: (!) 52 88 90 89  Resp: 17 20 20 15   Temp:      TempSrc:      SpO2: 98% 97% 98% 97%  Weight:      Height:      PainSc:        Isolation Precautions No active isolations  Medications Medications  lactated ringers infusion ( Intravenous New Bag/Given 07/16/22 1628)  fentaNYL (SUBLIMAZE) injection 50 mcg (0 mcg Intravenous Hold 07/16/22 1622)  enoxaparin (LOVENOX) injection 57.5 mg (has no administration in time range)  sodium chloride flush (NS) 0.9 % injection 3 mL (has no administration in time range)  cefTRIAXone (ROCEPHIN) 2 g in sodium chloride 0.9 % 100 mL IVPB (has no administration in time range)  acetaminophen (TYLENOL) tablet 650 mg (has no administration in time range)    Or  acetaminophen (TYLENOL) suppository 650 mg (has no administration in time range)  polyethylene glycol (MIRALAX / GLYCOLAX) packet 17 g (has no administration in time  range)  ondansetron (ZOFRAN) tablet 4 mg (has no administration in time range)    Or  ondansetron (ZOFRAN) injection 4 mg (has no administration in time range)  calcium carbonate (TUMS - dosed in mg elemental calcium) chewable tablet 200 mg of elemental calcium (has no administration in time range)  lactated ringers bolus 1,000 mL (0 mLs Intravenous Stopped 07/16/22 1622)  vancomycin (VANCOCIN) IVPB 1000 mg/200 mL premix (0 mg Intravenous Stopped 07/16/22 1622)  acetaminophen (TYLENOL) tablet 1,000 mg (1,000 mg Oral Given 07/16/22 1401)  ceFEPIme (MAXIPIME) 2 g in sodium chloride 0.9 % 100 mL IVPB (0 g Intravenous Stopped 07/16/22 1433)  iohexol (OMNIPAQUE) 300 MG/ML solution 100 mL (100 mLs Intravenous Contrast Given 07/16/22 1506)  sodium chloride 0.9 % bolus 1,000 mL (0 mLs Intravenous Stopped 07/16/22 1506)  sodium chloride 0.9 % bolus 1,000 mL (1,000 mLs Intravenous New Bag/Given 07/16/22 1549)    Mobility walks       R Recommendations: See Admitting Provider Note  Report given to:   Additional Notes:

## 2022-07-16 NOTE — Progress Notes (Signed)
CODE SEPSIS - PHARMACY COMMUNICATION  **Broad Spectrum Antibiotics should be administered within 1 hour of Sepsis diagnosis**  Time Code Sepsis Called/Page Received: 1347  Antibiotics Ordered: cefepime and vancomycin  Time of 1st antibiotic administration: 1403  Additional action taken by pharmacy: none  If necessary, Name of Provider/Nurse Contacted: n/a    Elliot Gurney, PharmD, BCPS Clinical Pharmacist  07/16/2022 1:48 PM

## 2022-07-16 NOTE — Plan of Care (Signed)
  Problem: Fluid Volume: Goal: Hemodynamic stability will improve Outcome: Progressing   Problem: Clinical Measurements: Goal: Diagnostic test results will improve Outcome: Progressing Goal: Signs and symptoms of infection will decrease Outcome: Progressing   Problem: Respiratory: Goal: Ability to maintain adequate ventilation will improve Outcome: Progressing   Problem: Education: Goal: Knowledge of General Education information will improve Description: Including pain rating scale, medication(s)/side effects and non-pharmacologic comfort measures Outcome: Progressing   Problem: Health Behavior/Discharge Planning: Goal: Ability to manage health-related needs will improve Outcome: Progressing   Problem: Clinical Measurements: Goal: Ability to maintain clinical measurements within normal limits will improve Outcome: Progressing Goal: Will remain free from infection Outcome: Progressing Goal: Diagnostic test results will improve Outcome: Progressing Goal: Respiratory complications will improve Outcome: Progressing Goal: Cardiovascular complication will be avoided Outcome: Progressing   Problem: Activity: Goal: Risk for activity intolerance will decrease Outcome: Progressing   Problem: Nutrition: Goal: Adequate nutrition will be maintained Outcome: Progressing   Problem: Coping: Goal: Level of anxiety will decrease Outcome: Progressing   Problem: Elimination: Goal: Will not experience complications related to bowel motility Outcome: Progressing Goal: Will not experience complications related to urinary retention Outcome: Progressing   Problem: Pain Managment: Goal: General experience of comfort will improve Outcome: Progressing   Problem: Skin Integrity: Goal: Risk for impaired skin integrity will decrease Outcome: Progressing

## 2022-07-16 NOTE — ED Triage Notes (Signed)
Pt sent to ED per PCP d/t abnormal labs and swelling and redness to his left leg. Pt reports this has been going on for 2 days with n/v and fever at home. Pt also had a recent root canal last week.

## 2022-07-16 NOTE — ED Notes (Signed)
Pt in bed, pt map is 75 MD notified, holding levophed at this time.  Pt denies pain at this time, holding fentanyl

## 2022-07-16 NOTE — Assessment & Plan Note (Addendum)
Patient is presenting with tachycardia, hypotension and fevers at home with notably elevated WBC in the setting of severe cellulitis.  Given hypotension and coagulopathy, meets criteria for severe sepsis.  Surprisingly, lactic acid is within normal limits.  - Admit to stepdown given hypotension - Continue IV fluid resuscitation - No indication for pressor support at this time, will start if MAPs are sustaining below 65 - Blood cultures pending - Check TTE given systolic murmur seems worse

## 2022-07-16 NOTE — Assessment & Plan Note (Signed)
Sudden onset left lower extremity erythema and swelling with increased tenderness and warmth to touch.  DVT study negative.  No evidence of purulence or ulceration.  - Blood cultures pending - Discontinue vancomycin - Transition cefepime to ceftriaxone

## 2022-07-16 NOTE — Assessment & Plan Note (Signed)
Hemoglobin stable at this time.  - Daily CBC - Transfuse for hemoglobin less than 7

## 2022-07-17 ENCOUNTER — Other Ambulatory Visit: Payer: Medicare Other

## 2022-07-17 ENCOUNTER — Other Ambulatory Visit: Payer: Self-pay

## 2022-07-17 DIAGNOSIS — I4891 Unspecified atrial fibrillation: Secondary | ICD-10-CM

## 2022-07-17 LAB — CBC WITH DIFFERENTIAL/PLATELET
Abs Immature Granulocytes: 0.04 10*3/uL (ref 0.00–0.07)
Basophils Absolute: 0 10*3/uL (ref 0.0–0.1)
Basophils Relative: 0 %
Eosinophils Absolute: 0 10*3/uL (ref 0.0–0.5)
Eosinophils Relative: 0 %
HCT: 22.9 % — ABNORMAL LOW (ref 39.0–52.0)
Hemoglobin: 7.2 g/dL — ABNORMAL LOW (ref 13.0–17.0)
Immature Granulocytes: 0 %
Lymphocytes Relative: 9 %
Lymphs Abs: 0.8 10*3/uL (ref 0.7–4.0)
MCH: 33.6 pg (ref 26.0–34.0)
MCHC: 31.4 g/dL (ref 30.0–36.0)
MCV: 107 fL — ABNORMAL HIGH (ref 80.0–100.0)
Monocytes Absolute: 0.8 10*3/uL (ref 0.1–1.0)
Monocytes Relative: 9 %
Neutro Abs: 7.9 10*3/uL — ABNORMAL HIGH (ref 1.7–7.7)
Neutrophils Relative %: 82 %
Platelets: 211 10*3/uL (ref 150–400)
RBC: 2.14 MIL/uL — ABNORMAL LOW (ref 4.22–5.81)
RDW: 23.4 % — ABNORMAL HIGH (ref 11.5–15.5)
Smear Review: NORMAL
WBC: 9.6 10*3/uL (ref 4.0–10.5)
nRBC: 0.3 % — ABNORMAL HIGH (ref 0.0–0.2)

## 2022-07-17 LAB — HEMOGLOBIN A1C
Hgb A1c MFr Bld: 4.4 % — ABNORMAL LOW (ref 4.8–5.6)
Mean Plasma Glucose: 79.58 mg/dL

## 2022-07-17 LAB — COMPREHENSIVE METABOLIC PANEL
ALT: 15 U/L (ref 0–44)
AST: 20 U/L (ref 15–41)
Albumin: 3.2 g/dL — ABNORMAL LOW (ref 3.5–5.0)
Alkaline Phosphatase: 35 U/L — ABNORMAL LOW (ref 38–126)
Anion gap: 5 (ref 5–15)
BUN: 25 mg/dL — ABNORMAL HIGH (ref 8–23)
CO2: 24 mmol/L (ref 22–32)
Calcium: 7.9 mg/dL — ABNORMAL LOW (ref 8.9–10.3)
Chloride: 104 mmol/L (ref 98–111)
Creatinine, Ser: 1.24 mg/dL (ref 0.61–1.24)
GFR, Estimated: >60 mL/min (ref >60)
Glucose, Bld: 109 mg/dL — ABNORMAL HIGH (ref 70–99)
Potassium: 3.6 mmol/L (ref 3.5–5.1)
Sodium: 133 mmol/L — ABNORMAL LOW (ref 135–145)
Total Bilirubin: 1.9 mg/dL — ABNORMAL HIGH (ref 0.3–1.2)
Total Protein: 5.8 g/dL — ABNORMAL LOW (ref 6.5–8.1)

## 2022-07-17 LAB — PROTIME-INR
INR: 1.5 — ABNORMAL HIGH (ref 0.8–1.2)
Prothrombin Time: 18.5 seconds — ABNORMAL HIGH (ref 11.4–15.2)

## 2022-07-17 NOTE — Plan of Care (Signed)
  Problem: Fluid Volume: Goal: Hemodynamic stability will improve Outcome: Not Progressing   Problem: Clinical Measurements: Goal: Diagnostic test results will improve Outcome: Not Progressing Goal: Signs and symptoms of infection will decrease Outcome: Not Progressing   Problem: Respiratory: Goal: Ability to maintain adequate ventilation will improve Outcome: Not Progressing   Problem: Education: Goal: Knowledge of General Education information will improve Description: Including pain rating scale, medication(s)/side effects and non-pharmacologic comfort measures Outcome: Not Progressing   Problem: Health Behavior/Discharge Planning: Goal: Ability to manage health-related needs will improve Outcome: Not Progressing   Problem: Clinical Measurements: Goal: Ability to maintain clinical measurements within normal limits will improve Outcome: Not Progressing Goal: Will remain free from infection Outcome: Not Progressing Goal: Diagnostic test results will improve Outcome: Not Progressing Goal: Respiratory complications will improve Outcome: Not Progressing Goal: Cardiovascular complication will be avoided Outcome: Not Progressing   Problem: Activity: Goal: Risk for activity intolerance will decrease Outcome: Not Progressing   Problem: Nutrition: Goal: Adequate nutrition will be maintained Outcome: Not Progressing   Problem: Elimination: Goal: Will not experience complications related to bowel motility Outcome: Not Progressing Goal: Will not experience complications related to urinary retention Outcome: Not Progressing   Problem: Pain Managment: Goal: General experience of comfort will improve Outcome: Not Progressing   Problem: Safety: Goal: Ability to remain free from injury will improve Outcome: Not Progressing   Problem: Skin Integrity: Goal: Risk for impaired skin integrity will decrease Outcome: Not Progressing   

## 2022-07-17 NOTE — Progress Notes (Signed)
PROGRESS NOTE  Brett Boone    DOB: [birth year], 77 y.o.  ZOX:[id number]    Code Status: Full Code   DOA: 07/16/2022   LOS: 1   Brief hospital course  Brett Boone is a 77 y.o. male with a PMH significant for myelodysplastic syndrome with ring sideroblasts, heart murmur, hydrocele, venous stasis dermatitis of both lower extremities, refractory anemia.  They presented from home to the ED on 07/16/2022 with fever and abnormal labs x 3 days.  Endorses having a root canal last week.  In the ED, it was found that they had normotensive at 113/56 with heart rate of 102. He was afebrile at 98.9. During his time in the ED, patient's blood pressure gradually decreased to as low as 89/58 with some improvement after IV fluids  Significant findings included WBC of 19.4, hemoglobin 8.6, MCV 107.1, sodium of 129, glucose 152, BUN 36, creatinine 1.54, total bilirubin of 3.4 and GFR 47.  Lactic acid within normal limits x 2.  INR elevated at 1.6. Chest x-ray obtained that demonstrated borderline cardiomegaly with prominent bilateral interstitial opacities, potentially pulmonary venous congestion.  CT of the tib/fib was obtained that demonstrated circumferential skin thickening and soft tissue swelling of the entire lower leg consistent with cellulitis.  They were initially treated with vancomycin and cefepime.   Patient was admitted to medicine service for further workup and management of cellulitis as outlined in detail below.  07/17/22 -stable  Assessment & Plan  Principal Problem:   Severe sepsis (HCC) Active Problems:   Cellulitis   AKI (acute kidney injury) (HCC)   Hyponatremia   Hyperglycemia   Multifocal atrial tachycardia   Refractory anemia with ring sideroblasts (HCC)   Elevated bilirubin  Severe sepsis  lower extremity cellulitis Patient is presenting with tachycardia, hypotension and fevers at home with notably elevated WBC in the setting of severe cellulitis.  Given  hypotension and coagulopathy, meets criteria for severe sepsis.lactic acid is within normal limits.  CT scan of left lower leg negative for abscess-findings are consistent with cellulitis.  CCM was consulted on admission for hypotension possibly needing pressors but patient responded well to IV fluids so they signed off. - dc IV fluid resuscitation given that he is tolerating good p.o. intake and has lower extremity edema - No indication for pressor support at this time, will start if MAPs are sustaining below 65 - Blood cultures no growth to date, continue to follow -Continue ceftriaxone -Analgesia as needed -PT evaluation  Refractory anemia with ring sideroblasts (HCC)-hemoglobin decreased from 8.6-7.2.  No active bleeding appreciated.  Likely at least partial dilutional component with the high volume of IV fluids given. -Repeat CBC a.m. - Transfuse for hemoglobin less than 7   AKI-resolved   Hyponatremia- improving - Recheck sodium in the a.m.   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  - Telemetry monitoring   Elevated bilirubin-improving History of elevated total bilirubin, exacerbated in the setting of sepsis.  Body mass index is 35.87 kg/m.  VTE ppx: Lovenox  Diet:     Diet   Diet regular Room service appropriate? Yes; Fluid consistency: Thin   Consultants: Critical care  Subjective 07/17/22    Pt reports feeling well today.  He denies any nausea, vomiting and was able to tolerate a good breakfast this morning.  He denies any lower extremity pain at rest.  Endorses that the swelling has improved.   Objective   Vitals:   07/17/22 0500 07/17/22 0600 07/17/22 0700 07/17/22 0800  BP:  (!) 102/57  (!) 93/49  Pulse: (!) 110 81 82   Resp: 18 17 (!) 9 19  Temp:      TempSrc:      SpO2:  92% 96% 97% 97%  Weight:      Height:        Intake/Output Summary (Last 24 hours) at 07/17/2022 0827 Last data filed at 07/17/2022 1610 Gross per 24 hour  Intake 6339.37 ml  Output 1950 ml  Net 4389.37 ml   Filed Weights   07/16/22 1157  Weight: 113.4 kg     Physical Exam:  General: awake, alert, NAD HEENT: atraumatic, clear conjunctiva, anicteric sclera, MMM, hearing grossly normal Respiratory: normal respiratory effort.  CTAB Cardiovascular: quick capillary refill, normal S1/S2, RRR Nervous: A&O x3. no gross focal neurologic deficits, normal speech Extremities: Significant chronic venous stasis changes bilaterally.  Left leg elevated as 3+ pitting edema to mid shin. Psychiatry: normal mood, congruent affect  Labs   I have personally reviewed the following labs and imaging studies CBC    Component Value Date/Time   WBC 9.6 07/17/2022 0422   RBC 2.14 (L) 07/17/2022 0422   HGB 7.2 (L) 07/17/2022 0422   HGB 9.5 (L) 07/15/2022 1103   HCT 22.9 (L) 07/17/2022 0422   HCT 29.1 (L) 07/15/2022 1103   PLT 211 07/17/2022 0422   PLT 375 07/15/2022 1103   MCV 107.0 (H) 07/17/2022 0422   MCV 103 (H) 07/15/2022 1103   MCH 33.6 07/17/2022 0422   MCHC 31.4 07/17/2022 0422   RDW 23.4 (H) 07/17/2022 0422   RDW 21.8 (H) 07/15/2022 1103   LYMPHSABS 0.8 07/17/2022 0422   LYMPHSABS 0.7 07/15/2022 1103   MONOABS 0.8 07/17/2022 0422   EOSABS 0.0 07/17/2022 0422   EOSABS 0.0 07/15/2022 1103   BASOSABS 0.0 07/17/2022 0422   BASOSABS 0.2 07/15/2022 1103      Latest Ref Rng & Units 07/17/2022    4:22 AM 07/16/2022   11:59 AM 07/15/2022   11:03 AM  BMP  Glucose 70 - 99 mg/dL 960  454  098   BUN 8 - 23 mg/dL 25  36  27   Creatinine 0.61 - 1.24 mg/dL 1.19  1.47  8.29   BUN/Creat Ratio 10 - 24   16   Sodium 135 - 145 mmol/L 133  129  136   Potassium 3.5 - 5.1 mmol/L 3.6  4.0  4.7   Chloride 98 - 111 mmol/L 104  98  99   CO2 22 - 32 mmol/L 24  24  21    Calcium 8.9 - 10.3 mg/dL 7.9  8.2   8.8     CT TIBIA FIBULA LEFT W CONTRAST  Result Date: 07/16/2022 CLINICAL DATA:  Left lower leg redness and swelling. EXAM: CT OF THE LOWER LEFT EXTREMITY WITH CONTRAST TECHNIQUE: Multidetector CT imaging of the lower left extremity was performed according to the standard protocol following intravenous contrast administration. RADIATION DOSE REDUCTION: This exam was performed according to the departmental dose-optimization program which includes automated exposure control, adjustment of the mA and/or kV according to patient size and/or use of iterative reconstruction technique. CONTRAST:  OMNIPAQUE IOHEXOL 300 MG/ML  SOLN COMPARISON:  Left tibia and fibula x-rays from same day. FINDINGS: Bones/Joint/Cartilage No fracture or dislocation. Degenerative changes of  the left knee and ankle. No joint effusion. Ligaments Ligaments are suboptimally evaluated by CT. Muscles and Tendons Grossly intact. Soft tissue Circumferential skin thickening and soft tissue swelling of the entire lower leg. No fluid collection or subcutaneous emphysema. No soft tissue mass. IMPRESSION: 1. Circumferential skin thickening and soft tissue swelling of the entire lower leg, consistent with cellulitis. No abscess or subcutaneous emphysema. Electronically Signed   By: Obie Dredge M.D.   On: 07/16/2022 15:33   DG Chest Port 1 View  Result Date: 07/16/2022 CLINICAL DATA:  Questionable sepsis - evaluate for abnormality EXAM: PORTABLE CHEST 1 VIEW COMPARISON:  CXR 12/31/16 FINDINGS: No pleural effusion. No pneumothorax. Borderline cardiomegaly. No focal airspace opacity. There are prominent bilateral interstitial opacities could represent pulmonary venous congestion. No radiographically apparent displaced rib fractures. Visualized upper abdomen is unremarkable. IMPRESSION: Borderline cardiomegaly with prominent bilateral interstitial opacities, which could represent pulmonary venous congestion. Electronically Signed   By: Lorenza Cambridge M.D.   On: 07/16/2022 14:19   DG Tibia/Fibula Left  Result Date: 07/16/2022 CLINICAL DATA:  infection EXAM: LEFT TIBIA AND FIBULA - 2 VIEW COMPARISON:  None Available. FINDINGS: No cortical erosion or destruction. There is no evidence of fracture or other focal bone lesions. Moderate tricompartmental degenerative changes of the knee. Subcutaneus soft tissue edema. Dermal/vascular calcifications. IMPRESSION: 1. No acute displaced fracture or dislocation. 2. No radiographic findings to suggest osteomyelitis. Limited evaluation due to overlapping osseous structures and overlying soft tissues. If high clinical concern, please consider MRI for further evaluation (with intravenous contrast if GFR greater than 30). Electronically Signed   By: Tish Frederickson M.D.   On: 07/16/2022 14:13   US Venous Img Lower Unilateral Left (DVT)  Result Date: 07/15/2022 CLINICAL DATA:  Left lower extremity pain and edema. Evaluate for DVT. EXAM: LEFT LOWER EXTREMITY VENOUS DOPPLER ULTRASOUND TECHNIQUE: Gray-scale sonography with graded compression, as well as color Doppler and duplex ultrasound were performed to evaluate the lower extremity deep venous systems from the level of the common femoral vein and including the common femoral, femoral, profunda femoral, popliteal and calf veins including the posterior tibial, peroneal and gastrocnemius veins when visible. The superficial great saphenous vein was also interrogated. Spectral Doppler was utilized to evaluate flow at rest and with distal augmentation maneuvers in the common femoral, femoral and popliteal veins. COMPARISON:  None Available. FINDINGS: Contralateral Common Femoral Vein: Respiratory phasicity is normal and symmetric with the symptomatic side. No evidence of thrombus. Normal compressibility. Common Femoral Vein: No evidence of thrombus. Normal compressibility, respiratory phasicity and response to augmentation. Saphenofemoral Junction: No evidence of thrombus.  Normal compressibility and flow on color Doppler imaging. Profunda Femoral Vein: No evidence of thrombus. Normal compressibility and flow on color Doppler imaging. Femoral Vein: No evidence of thrombus. Normal compressibility, respiratory phasicity and response to augmentation. Popliteal Vein: No evidence of thrombus. Normal compressibility, respiratory phasicity and response to augmentation. Calf Veins: No evidence of thrombus. Normal compressibility and flow on color Doppler imaging. Superficial Great Saphenous Vein: No evidence of thrombus. Normal compressibility. Other Findings: Note is made of a prominent though non pathologically enlarged left inguinal lymph node. The lymph node is not enlarged by size criteria measuring 1.2 cm in greatest short axis diameter and maintains a benign fatty hilum without cortical thickening (image 34), presumably reactive in etiology. Note is made of a mildly prominent though widely patent superficial varicosity at the level of the left lower leg and calf (images 39 through 42), without evidence superficial  thrombophlebitis. IMPRESSION: 1. No evidence of DVT within the left lower extremity. 2. Mildly prominent though widely patent superficial varicosity at the level of the left lower leg calf. No evidence of superficial thrombophlebitis. Electronically Signed   By: Simonne Come M.D.   On: 07/15/2022 14:00    Disposition Plan & Communication  Patient status: Inpatient  Admitted From: Home Planned disposition location: Home Anticipated discharge date: 6/22 pending clinical improvement  Family Communication: None at bedside   Author: Leeroy Bock, DO [facility] 07/17/2022, 8:27 AM   Available by Epic secure chat 7AM-7PM. If 7PM-7AM, please contact night-coverage.  [facility] contact information found on [email or web address].

## 2022-07-18 ENCOUNTER — Inpatient Hospital Stay
Admit: 2022-07-18 | Discharge: 2022-07-18 | Disposition: A | Discharge status: Home / Self Care | Payer: Medicare Other | Attending: Internal Medicine | Admitting: Internal Medicine

## 2022-07-18 DIAGNOSIS — D649 Anemia, unspecified: Secondary | ICD-10-CM

## 2022-07-18 DIAGNOSIS — A419 Sepsis, unspecified organism: Secondary | ICD-10-CM

## 2022-07-18 LAB — ECHOCARDIOGRAM COMPLETE
AV Mean grad: 8 mmHg
AV Peak grad: 14.8 mmHg
Ao pk vel: 1.93 m/s
Area-P 1/2: 2.5 cm2
Height: 70 in
S' Lateral: 3.7 cm
Weight: 4000 oz

## 2022-07-18 LAB — CBC
HCT: 21.2 % — ABNORMAL LOW (ref 39.0–52.0)
Hemoglobin: 6.7 g/dL — ABNORMAL LOW (ref 13.0–17.0)
MCH: 34.2 pg — ABNORMAL HIGH (ref 26.0–34.0)
MCHC: 31.6 g/dL (ref 30.0–36.0)
MCV: 108.2 fL — ABNORMAL HIGH (ref 80.0–100.0)
Platelets: 189 10*3/uL (ref 150–400)
RBC: 1.96 MIL/uL — ABNORMAL LOW (ref 4.22–5.81)
RDW: 22.9 % — ABNORMAL HIGH (ref 11.5–15.5)
WBC: 6.6 10*3/uL (ref 4.0–10.5)
nRBC: 0.3 % — ABNORMAL HIGH (ref 0.0–0.2)

## 2022-07-18 LAB — COMPREHENSIVE METABOLIC PANEL
ALT: 17 U/L (ref 0–44)
AST: 21 U/L (ref 15–41)
Albumin: 2.9 g/dL — ABNORMAL LOW (ref 3.5–5.0)
Alkaline Phosphatase: 40 U/L (ref 38–126)
Anion gap: 7 (ref 5–15)
BUN: 17 mg/dL (ref 8–23)
CO2: 22 mmol/L (ref 22–32)
Calcium: 8.2 mg/dL — ABNORMAL LOW (ref 8.9–10.3)
Chloride: 106 mmol/L (ref 98–111)
Creatinine, Ser: 1.05 mg/dL (ref 0.61–1.24)
GFR, Estimated: >60 mL/min (ref >60)
Glucose, Bld: 103 mg/dL — ABNORMAL HIGH (ref 70–99)
Potassium: 3.9 mmol/L (ref 3.5–5.1)
Sodium: 135 mmol/L (ref 135–145)
Total Bilirubin: 1.1 mg/dL (ref 0.3–1.2)
Total Protein: 5.6 g/dL — ABNORMAL LOW (ref 6.5–8.1)

## 2022-07-18 LAB — BPAM RBC
Blood Product Expiration Date: 202407262359
Unit Type and Rh: 5100

## 2022-07-18 LAB — ABO/RH: ABO/RH(D): O POS

## 2022-07-18 LAB — TYPE AND SCREEN

## 2022-07-18 LAB — CULTURE, BLOOD (ROUTINE X 2)

## 2022-07-18 LAB — PREPARE RBC (CROSSMATCH)

## 2022-07-18 MED ORDER — SODIUM CHLORIDE 0.9% IV SOLUTION: Freq: Once | INTRAVENOUS | Status: AC

## 2022-07-18 MED ORDER — DARBEPOETIN ALFA 25 MCG/0.42ML IJ SOSY
25.0000 ug | PREFILLED_SYRINGE | Freq: Once | INTRAMUSCULAR | Status: AC
  Administered 2022-07-18: 25 ug via SUBCUTANEOUS
  Filled 2022-07-18: qty 0.42

## 2022-07-18 NOTE — Evaluation (Signed)
Physical Therapy Evaluation Patient Details Name: Brett Boone MRN: [id number] DOB: 08-10-1945 Today's Date: 07/18/2022  History of Present Illness  They presented from home to the ED on 07/16/2022 with fever and abnormal labs x 3 days.  Endorses having a root canal last week.     In the ED, it was found that they had normotensive at 113/56 with heart rate of 102. He was afebrile at 98.9. During his time in the ED, patient's blood pressure gradually decreased to as low as 89/58 with some improvement after IV fluids   Significant findings included WBC of 19.4, hemoglobin 8.6, MCV 107.1, sodium of 129, glucose 152, BUN 36, creatinine 1.54, total bilirubin of 3.4 and GFR 47.  Lactic acid within normal limits x 2.  INR elevated at 1.6. Chest x-ray obtained that demonstrated borderline cardiomegaly with prominent bilateral interstitial opacities, potentially pulmonary venous congestion.  CT of the tib/fib was obtained that demonstrated circumferential skin thickening and soft tissue swelling of the entire lower leg consistent with cellulitis.  Clinical Impression  Pt received in chair with  nurse and Spouse by his side agreeable to PT evaluation. Pt denies dizziness and reported of pain in L dorsal foot and inferior calf with palpation. Pt stated, " I can't wait to go home." Pt Independent with all household indoor and out door activities, community level activities and driving prior to issues with LLE. Pt used cane after beginning to have pain in LLE. Pt has been ambulatory in his room in the hospital as per nurse and pt. PT assessment revealed BP in sitting , standing and after exertion 115/79, 120/78, 130/75 respectively. Pt remained asymptomatic through out the session. Pt ambulated around the Nurses station and hallway holding IV pole without symptoms and assistance. Pt deferred form attempting Stairs 2/2 pt actively receiving Blood transfusion. PT will continue for safe stair training. Pt tol  treatment well.      Recommendations for follow up therapy are one component of a multi-disciplinary discharge planning process, led by the attending physician.  Recommendations may be updated based on patient status, additional functional criteria and insurance authorization.  Follow Up Recommendations       Assistance Recommended at Discharge Set up Supervision/Assistance  Patient can return home with the following  Help with stairs or ramp for entrance    Equipment Recommendations None recommended by PT  Recommendations for Other Services       Functional Status Assessment Patient has had a recent decline in their functional status and demonstrates the ability to make significant improvements in function in a reasonable and predictable amount of time.     Precautions / Restrictions Precautions Precautions: None Restrictions Weight Bearing Restrictions: No      Mobility  Bed Mobility Overal bed mobility: Independent             General bed mobility comments: pt received in chair which he got OOB indepdendently as pt nurse and Pt.    Transfers Overall transfer level: Independent                      Ambulation/Gait Ambulation/Gait assistance: Modified independent (Device/Increase time) Gait Distance (Feet): 250 Feet Assistive device: IV Pole Gait Pattern/deviations: WFL(Within Functional Limits) Gait velocity: normal        Stairs            Wheelchair Mobility    Modified Rankin (Stroke Patients Only)       Balance Overall balance assessment:  Independent                                           Pertinent Vitals/Pain Pain Assessment Pain Assessment: 0-10 Pain Score: 3  Pain Location: L dorsum of L foot and inferior calf area. Pain Descriptors / Indicators: Aching Pain Intervention(s): Monitored during session    Home Living Family/patient expects to be discharged to:: Private residence Living Arrangements:  Spouse/significant other Available Help at Discharge: Family;Available 24 hours/day Type of Home: House       Alternate Level Stairs-Number of Steps: 13 Home Layout: Two level Home Equipment: Cane - single point      Prior Function Prior Level of Function : Independent/Modified Independent             Mobility Comments: Pt Indepedent at household level, yard work, community level activity particiaption without AD and Drives. ADLs Comments: Independent.     Hand Dominance   Dominant Hand: Right    Extremity/Trunk Assessment   Upper Extremity Assessment Upper Extremity Assessment: Overall WFL for tasks assessed    Lower Extremity Assessment Lower Extremity Assessment: Overall WFL for tasks assessed    Cervical / Trunk Assessment Cervical / Trunk Assessment: Normal  Communication   Communication: No difficulties  Cognition Arousal/Alertness: Awake/alert Behavior During Therapy: WFL for tasks assessed/performed Overall Cognitive Status: Within Functional Limits for tasks assessed                                          General Comments      Exercises     Assessment/Plan    PT Assessment Patient needs continued PT services  PT Problem List Pain       PT Treatment Interventions Stair training    PT Goals (Current goals can be found in the Care Plan section)  Acute Rehab PT Goals Patient Stated Goal: " I want to go home." PT Goal Formulation: With patient Time For Goal Achievement: 07/25/22 Potential to Achieve Goals: Good    Frequency Min 1X/week     Co-evaluation               AM-PAC PT "6 Clicks" Mobility  Outcome Measure Help needed turning from your back to your side while in a flat bed without using bedrails?: None Help needed moving from lying on your back to sitting on the side of a flat bed without using bedrails?: None Help needed moving to and from a bed to a chair (including a wheelchair)?: None Help needed  standing up from a chair using your arms (e.g., wheelchair or bedside chair)?: None Help needed to walk in hospital room?: None Help needed climbing 3-5 steps with a railing? : A Little 6 Click Score: 23    End of Session Equipment Utilized During Treatment: Gait belt Activity Tolerance: Patient tolerated treatment well Patient left: in chair;with family/visitor present Nurse Communication: Mobility status PT Visit Diagnosis: Pain Pain - Right/Left: Left Pain - part of body: Leg    Time: 4166-0630 PT Time Calculation (min) (ACUTE ONLY): 29 min   Charges:   PT Evaluation $PT Eval Moderate Complexity: 1 Mod PT Treatments $Gait Training: 8-22 mins       Astryd Pearcy PT DPT 11:55 AM,07/18/22

## 2022-07-18 NOTE — Plan of Care (Signed)

## 2022-07-18 NOTE — Progress Notes (Signed)
  Echocardiogram 2D Echocardiogram has been performed.  Brett Boone 07/18/2022, 1:08 PM

## 2022-07-18 NOTE — Progress Notes (Signed)
PROGRESS NOTE  Brett Boone    DOB: 04/21/1945, 77 y.o.  ZOX:[id number]    Code Status: Full Code   DOA: 07/16/2022   LOS: 2   Brief hospital course  Brett Boone is a 77 y.o. male with a PMH significant for myelodysplastic syndrome with ring sideroblasts, heart murmur, hydrocele, venous stasis dermatitis of both lower extremities, refractory anemia.  They presented from home to the ED on 07/16/2022 with fever and abnormal labs x 3 days.  Endorses having a root canal last week.  In the ED, it was found that they had normotensive at 113/56 with heart rate of 102. He was afebrile at 98.9. During his time in the ED, patient's blood pressure gradually decreased to as low as 89/58 with some improvement after IV fluids  Significant findings included WBC of 19.4, hemoglobin 8.6, MCV 107.1, sodium of 129, glucose 152, BUN 36, creatinine 1.54, total bilirubin of 3.4 and GFR 47.  Lactic acid within normal limits x 2.  INR elevated at 1.6. Chest x-ray obtained that demonstrated borderline cardiomegaly with prominent bilateral interstitial opacities, potentially pulmonary venous congestion.  CT of the tib/fib was obtained that demonstrated circumferential skin thickening and soft tissue swelling of the entire lower leg consistent with cellulitis.  They were initially treated with vancomycin and cefepime.   Patient was admitted to medicine service for further workup and management of cellulitis as outlined in detail below.  07/18/22 -stable  Assessment & Plan  Principal Problem:   Severe sepsis (HCC) Active Problems:   Cellulitis   AKI (acute kidney injury) (HCC)   Hyponatremia   Hyperglycemia   Multifocal atrial tachycardia   Refractory anemia with ring sideroblasts (HCC)   Elevated bilirubin  Severe sepsis  lower L extremity cellulitis Patient is presenting with tachycardia, hypotension and fevers at home with notably elevated WBC in the setting of severe cellulitis.  Given  hypotension and coagulopathy, meets criteria for severe sepsis. lactic acid is within normal limits.  CT scan of left lower leg negative for abscess-findings are consistent with cellulitis.  CCM was consulted on admission for hypotension possibly needing pressors but patient responded well to IV fluids so they signed off. WBC improved to normal range - Blood cultures no growth to date, continue to follow -Continue ceftriaxone -Analgesia as needed -PT evaluation- no PT follow up needed - compression stockings ordered but not in place. Order placed again.  Refractory anemia with ring sideroblasts (HCC)-hemoglobin decreased from 8.6-7.2>6.7 today.  No active bleeding appreciated although patient describes black stool this morning.  Likely at least partial dilutional component with the high volume of IV fluids given. - transfusing 1 unit pRBC today  - give home arenesp dose which is due Wednesday (q2 week schedule) -Repeat CBC a.m. - FOBT - Transfuse for hemoglobin less than 7 - f/u with hematology    AKI-resolved   Hyponatremia- resolved - Recheck sodium in the a.m.   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  - Telemetry monitoring   Elevated bilirubin-improving History of elevated total bilirubin, exacerbated in the setting of sepsis.  Body mass index is 35.87 kg/m.  VTE ppx: Place TED hose Start: 07/17/22 1646Lovenox  Diet:     Diet   Diet regular Room service appropriate? Yes; Fluid consistency: Thin   Consultants: Critical care  Subjective 07/18/22    Pt reports feeling well today. His leg swelling and pain is improving. He is tolerating ambulation. Denies chest pain, DOE or orthostatic symptoms. Describes a black stool this am without signs of overt blood.    Objective   Vitals:    07/17/22 1817 07/17/22 1900 07/17/22 2214 07/18/22 0518  BP: 124/65 118/65 113/62 116/64  Pulse: 91 94 69 80  Resp: 18 (!) 23 20 17   Temp:   99.5 F (37.5 C) 98.8 F (37.1 C)  TempSrc:      SpO2: 96% 100% 98% 100%  Weight:      Height:        Intake/Output Summary (Last 24 hours) at 07/18/2022 0723 Last data filed at 07/17/2022 2110 Gross per 24 hour  Intake 1321.98 ml  Output 750 ml  Net 571.98 ml    Filed Weights   07/16/22 1157  Weight: 113.4 kg    Physical Exam:  General: awake, alert, NAD HEENT: atraumatic, clear conjunctiva, anicteric sclera, MMM, hearing grossly normal Respiratory: normal respiratory effort.  CTAB Cardiovascular: quick capillary refill, normal S1/S2, RRR Nervous: A&O x3. no gross focal neurologic deficits, normal speech Extremities: Significant chronic venous stasis changes bilaterally.  Left leg elevated as 2+ pitting edema to mid shin. Psychiatry: normal mood, congruent affect  Labs   I have personally reviewed the following labs and imaging studies CBC    Component Value Date/Time   WBC 6.6 07/18/2022 0447   RBC 1.96 (L) 07/18/2022 0447   HGB 6.7 (L) 07/18/2022 0447   HGB 9.5 (L) 07/15/2022 1103   HCT 21.2 (L) 07/18/2022 0447   HCT 29.1 (L) 07/15/2022 1103   PLT 189 07/18/2022 0447   PLT 375 07/15/2022 1103   MCV 108.2 (H) 07/18/2022 0447   MCV 103 (H) 07/15/2022 1103   MCH 34.2 (H) 07/18/2022 0447   MCHC 31.6 07/18/2022 0447   RDW 22.9 (H) 07/18/2022 0447   RDW 21.8 (H) 07/15/2022 1103   LYMPHSABS 0.8 07/17/2022 0422   LYMPHSABS 0.7 07/15/2022 1103   MONOABS 0.8 07/17/2022 0422   EOSABS 0.0 07/17/2022 0422   EOSABS 0.0 07/15/2022 1103   BASOSABS 0.0 07/17/2022 0422   BASOSABS 0.2 07/15/2022 1103      Latest Ref Rng & Units 07/18/2022    4:47 AM 07/17/2022    4:22 AM 07/16/2022   11:59 AM  BMP  Glucose 70 - 99 mg/dL 191  478  295   BUN 8 - 23 mg/dL 17  25  36   Creatinine 0.61 - 1.24 mg/dL 6.21  3.08  6.57   Sodium 135 - 145  mmol/L 135  133  129   Potassium 3.5 - 5.1 mmol/L 3.9  3.6  4.0   Chloride 98 - 111 mmol/L 106  104  98   CO2 22 - 32 mmol/L 22  24  24    Calcium 8.9 - 10.3 mg/dL 8.2  7.9  8.2     CT TIBIA FIBULA LEFT W CONTRAST  Result Date: 07/16/2022 CLINICAL DATA:  Left lower leg redness and swelling. EXAM: CT OF THE LOWER LEFT EXTREMITY WITH CONTRAST TECHNIQUE: Multidetector CT imaging of the lower left extremity was performed according to the standard protocol following intravenous contrast administration. RADIATION DOSE REDUCTION: This exam was performed according to the departmental dose-optimization program which includes automated exposure control, adjustment of the mA and/or kV according to patient size and/or use of iterative reconstruction technique. CONTRAST:  OMNIPAQUE IOHEXOL 300 MG/ML  SOLN COMPARISON:  Left tibia and fibula x-rays from same day. FINDINGS: Bones/Joint/Cartilage No fracture or dislocation. Degenerative changes of the left knee and ankle. No joint effusion. Ligaments Ligaments are suboptimally evaluated by CT. Muscles and Tendons Grossly intact. Soft tissue Circumferential skin thickening and soft tissue swelling of the entire lower leg. No fluid collection or subcutaneous emphysema. No soft tissue mass. IMPRESSION: 1. Circumferential skin thickening and soft tissue swelling of the entire lower leg, consistent with cellulitis. No abscess or subcutaneous emphysema. Electronically Signed   By: Obie Dredge M.D.   On: 07/16/2022 15:33   DG Chest Port 1 View  Result Date: 07/16/2022 CLINICAL DATA:  Questionable sepsis - evaluate for abnormality EXAM: PORTABLE CHEST 1 VIEW COMPARISON:  CXR 12/31/16 FINDINGS: No pleural effusion. No pneumothorax. Borderline cardiomegaly. No focal airspace opacity. There are prominent bilateral interstitial opacities could represent pulmonary venous congestion. No radiographically apparent displaced rib fractures. Visualized upper abdomen is  unremarkable. IMPRESSION: Borderline cardiomegaly with prominent bilateral interstitial opacities, which could represent pulmonary venous congestion. Electronically Signed   By: Lorenza Cambridge M.D.   On: 07/16/2022 14:19   DG Tibia/Fibula Left  Result Date: 07/16/2022 CLINICAL DATA:  infection EXAM: LEFT TIBIA AND FIBULA - 2 VIEW COMPARISON:  None Available. FINDINGS: No cortical erosion or destruction. There is no evidence of fracture or other focal bone lesions. Moderate tricompartmental degenerative changes of the knee. Subcutaneus soft tissue edema. Dermal/vascular calcifications. IMPRESSION: 1. No acute displaced fracture or dislocation. 2. No radiographic findings to suggest osteomyelitis. Limited evaluation due to overlapping osseous structures and overlying soft tissues. If high clinical concern, please consider MRI for further evaluation (with intravenous contrast if GFR greater than 30). Electronically Signed   By: Tish Frederickson M.D.   On: 07/16/2022 14:13    Disposition Plan & Communication  Patient status: Inpatient  Admitted From: Home Planned disposition location: Home Anticipated discharge date: 6/23 pending clinical improvement  Family Communication: wife at bedside   Author: Leeroy Bock, DO [facility] 07/18/2022, 7:23 AM   Available by Epic secure chat 7AM-7PM. If 7PM-7AM, please contact night-coverage.  [facility] contact information found on [email or web address].

## 2022-07-19 LAB — COMPREHENSIVE METABOLIC PANEL
ALT: 19 U/L (ref 0–44)
AST: 20 U/L (ref 15–41)
Albumin: 2.9 g/dL — ABNORMAL LOW (ref 3.5–5.0)
Alkaline Phosphatase: 42 U/L (ref 38–126)
Anion gap: 6 (ref 5–15)
BUN: 14 mg/dL (ref 8–23)
CO2: 22 mmol/L (ref 22–32)
Calcium: 7.9 mg/dL — ABNORMAL LOW (ref 8.9–10.3)
Chloride: 102 mmol/L (ref 98–111)
Creatinine, Ser: 0.99 mg/dL (ref 0.61–1.24)
GFR, Estimated: >60 mL/min (ref >60)
Glucose, Bld: 105 mg/dL — ABNORMAL HIGH (ref 70–99)
Potassium: 3.5 mmol/L (ref 3.5–5.1)
Sodium: 130 mmol/L — ABNORMAL LOW (ref 135–145)
Total Bilirubin: 0.9 mg/dL (ref 0.3–1.2)
Total Protein: 5.6 g/dL — ABNORMAL LOW (ref 6.5–8.1)

## 2022-07-19 LAB — CBC
HCT: 23.5 % — ABNORMAL LOW (ref 39.0–52.0)
Hemoglobin: 7.2 g/dL — ABNORMAL LOW (ref 13.0–17.0)
MCH: 32.6 pg (ref 26.0–34.0)
MCHC: 30.6 g/dL (ref 30.0–36.0)
MCV: 106.3 fL — ABNORMAL HIGH (ref 80.0–100.0)
Platelets: 211 10*3/uL (ref 150–400)
RBC: 2.21 MIL/uL — ABNORMAL LOW (ref 4.22–5.81)
RDW: 22.5 % — ABNORMAL HIGH (ref 11.5–15.5)
WBC: 5.2 10*3/uL (ref 4.0–10.5)
nRBC: 0 % (ref 0.0–0.2)

## 2022-07-19 LAB — BPAM RBC: ISSUE DATE / TIME: 202406220949

## 2022-07-19 LAB — TYPE AND SCREEN
ABO/RH(D): O POS
Antibody Screen: NEGATIVE
Unit division: 0

## 2022-07-19 LAB — OCCULT BLOOD X 1 CARD TO LAB, STOOL: Fecal Occult Bld: NEGATIVE

## 2022-07-19 MED ORDER — CEPHALEXIN 500 MG PO CAPS: 500.0000 mg | ORAL_CAPSULE | Freq: Three times a day (TID) | ORAL | 0 refills | Status: AC

## 2022-07-19 NOTE — Plan of Care (Signed)
  Problem: Fluid Volume: Goal: Hemodynamic stability will improve Outcome: Adequate for Discharge   Problem: Clinical Measurements: Goal: Diagnostic test results will improve Outcome: Adequate for Discharge Goal: Signs and symptoms of infection will decrease Outcome: Adequate for Discharge   Problem: Respiratory: Goal: Ability to maintain adequate ventilation will improve Outcome: Adequate for Discharge   Problem: Education: Goal: Knowledge of General Education information will improve Description: Including pain rating scale, medication(s)/side effects and non-pharmacologic comfort measures Outcome: Adequate for Discharge   Problem: Health Behavior/Discharge Planning: Goal: Ability to manage health-related needs will improve Outcome: Adequate for Discharge   Problem: Clinical Measurements: Goal: Ability to maintain clinical measurements within normal limits will improve Outcome: Adequate for Discharge Goal: Will remain free from infection Outcome: Adequate for Discharge Goal: Diagnostic test results will improve Outcome: Adequate for Discharge Goal: Respiratory complications will improve Outcome: Adequate for Discharge Goal: Cardiovascular complication will be avoided Outcome: Adequate for Discharge   Problem: Activity: Goal: Risk for activity intolerance will decrease Outcome: Adequate for Discharge   Problem: Nutrition: Goal: Adequate nutrition will be maintained Outcome: Adequate for Discharge   Problem: Coping: Goal: Level of anxiety will decrease Outcome: Adequate for Discharge   Problem: Elimination: Goal: Will not experience complications related to bowel motility Outcome: Adequate for Discharge Goal: Will not experience complications related to urinary retention Outcome: Adequate for Discharge   Problem: Pain Managment: Goal: General experience of comfort will improve Outcome: Adequate for Discharge   Problem: Safety: Goal: Ability to remain free  from injury will improve Outcome: Adequate for Discharge   Problem: Skin Integrity: Goal: Risk for impaired skin integrity will decrease Outcome: Adequate for Discharge   Problem: Acute Rehab PT Goals(only PT should resolve) Goal: Pt Will Go Up/Down Stairs Outcome: Adequate for Discharge

## 2022-07-19 NOTE — Discharge Summary (Signed)
Physician Discharge Summary  Patient: Brett Boone ZOX:[id number] DOB: 06-Jul-1945   Code Status: Full Code Admit date: 07/16/2022 Discharge date: 07/19/2022 Disposition: Home, No home health services recommended PCP: Brett Limerick, MD  Recommendations for Outpatient Follow-up:  Follow up with PCP within 1-2 weeks Regarding general hospital follow up and preventative care Recommend monitoring infection healing CBC to monitor hgb. Hgb 7.2 on day of discharge. Received 1 dose aranesp and 1 unit pRBCs Follow up with oncology  Discharge Diagnoses:  Principal Problem:   Severe sepsis (HCC) Active Problems:   Cellulitis   AKI (acute kidney injury) (HCC)   Hyponatremia   Hyperglycemia   Multifocal atrial tachycardia   Refractory anemia with ring sideroblasts (HCC)   Elevated bilirubin   Normocytic anemia   Sepsis [facility])  Brief Hospital Course Summary: Brett Boone Boone is a 77 y.o. male with a PMH significant for myelodysplastic syndrome with ring sideroblasts, heart murmur, hydrocele, venous stasis dermatitis of both lower extremities, refractory anemia.   They presented from home to the ED on 07/16/2022 with fever and abnormal labs x 3 days.  Endorses having a root canal last week.   In the ED, it was found that they had normotensive at 113/56 with heart rate of 102. He was afebrile at 98.9. During his time in the ED, patient's blood pressure gradually decreased to as low as 89/58 with some improvement after IV fluids  Significant findings included WBC of 19.4, hemoglobin 8.6, MCV 107.1, sodium of 129, glucose 152, BUN 36, creatinine 1.54, total bilirubin of 3.4 and GFR 47.  Lactic acid within normal limits x 2.  INR elevated at 1.6. Chest x-ray obtained that demonstrated borderline cardiomegaly with prominent bilateral interstitial opacities, potentially pulmonary venous congestion.  CT of the tib/fib was obtained that demonstrated circumferential skin thickening and soft  tissue swelling of the entire lower leg consistent with cellulitis.   They were initially treated with vancomycin and cefepime.    Patient was admitted to medicine service for further workup and management of cellulitis as outlined in detail below.  Cellulitis picture improved well with antibiotics and diuresis so was able to transition to oral antibiotic which he will continue at home. Blood cultures remained negative and he was afebrile >48 hours.  He received a large amount of fluids for sepsis treatment on admission which contributed to his hypervolemia and likely some component of dilution as shown by gradual decline of hgb. He had no evidence of acute bleed. Treated with home aranesp dose and 1 unit pRBCs. Hgb responded well 6.7>7.2 and he was asymptomatic on day of discharge.   All other chronic conditions were treated with home medications.   Discharge Condition: Good, improved Recommended discharge diet: Regular healthy diet  Consultations: None   Procedures/Studies: Blood transfusion  Allergies as of 07/19/2022       Reactions   Amoxicillin-pot Clavulanate Hives, Nausea And Vomiting        Medication List     STOP taking these medications    ibuprofen 200 MG tablet Commonly known as: ADVIL   sulfamethoxazole-trimethoprim 800-160 MG tablet Commonly known as: BACTRIM DS       TAKE these medications    acetaminophen 325 MG tablet Commonly known as: TYLENOL Take 650 mg by mouth every 6 (six) hours as needed.   Calcium Carb-Cholecalciferol 600-400 MG-UNIT Tabs Take by mouth.   cephALEXin 500 MG capsule Commonly known as: KEFLEX Take 1 capsule (500 mg total) by  mouth 3 (three) times daily for 5 days.   Darbepoetin Alfa 25 MCG/0.42ML Sosy injection Commonly known as: ARANESP Inject 25 mcg into the skin.   meloxicam 15 MG tablet Commonly known as: MOBIC Take 1 tablet (15 mg total) by mouth daily.   mupirocin ointment 2 % Commonly known as:  BACTROBAN Apply 1 Application topically 2 (two) times daily.   tadalafil 5 MG tablet Commonly known as: CIALIS TAKE ONE TABLET BY MOUTH DAILY        Follow-up Information     Brett Limerick, MD Follow up in 1 week(s).   Specialty: Family Medicine Why: As needed Contact information: [address] [place] [address] [phone]         Brett Hines, MD. Go in 3 day(s).   Specialty: Oncology Contact information: [address] [place] [address] [phone]                 Subjective   Pt reports feeling well today. Denies known bleeding. No orthostatic symptoms. His lower extremity swelling is greatly improved. Denies pain in infected leg.  All questions and concerns were addressed at time of discharge.  Objective  Blood pressure 135/76, pulse 98, temperature 98.8 F (37.1 C), resp. rate 18, height 5\' 10"  (1.778 m), weight 113.4 kg, SpO2 98 %.   General: Pt is alert, awake, not in acute distress Cardiovascular: RRR, S1/S2 +, no rubs, no gallops Respiratory: CTA bilaterally, no wheezing, no rhonchi Abdominal: Soft, NT, ND, bowel sounds + Extremities: no edema, no cyanosis  The results of significant diagnostics from this hospitalization (including imaging, microbiology, ancillary and laboratory) are listed below for reference.   Imaging studies: ECHOCARDIOGRAM COMPLETE  Result Date: 07/18/2022    ECHOCARDIOGRAM REPORT   Patient Name:   Brett Boone Date of Exam: 07/18/2022 Medical Rec #:  [id number]             Height:       70.0 in Accession #:    [id number]            Weight:       250.0 lb Date of Birth:  [birth year]             BSA:          2.295 m Patient Age:    76 years              BP:           116/64 mmHg Patient Gender: M                     HR:           79 bpm. Exam Location:  [facility] Procedure: 2D Echo Indications:     Fever R50.9                  [place]ur R01.1  History:         Patient has no prior history of  Echocardiogram examinations.  Sonographer:     Overton Mam RDCS, FASE Referring Phys:  [id number] Verdene Lennert Diagnosing Phys: Alwyn Pea MD IMPRESSIONS  1. Left ventricular ejection fraction, by estimation, is 55 to 60%. The left ventricle has normal function. The left ventricle has no regional wall motion abnormalities. The left ventricular internal cavity size was mildly dilated. There is mild left ventricular hypertrophy. Left ventricular diastolic parameters were normal.  2. Right ventricular systolic function is normal. The  right ventricular size is normal.  3. The mitral valve is normal in structure. Trivial mitral valve regurgitation.  4. The aortic valve is normal in structure. Aortic valve regurgitation is not visualized. FINDINGS  Left Ventricle: Left ventricular ejection fraction, by estimation, is 55 to 60%. The left ventricle has normal function. The left ventricle has no regional wall motion abnormalities. The left ventricular internal cavity size was mildly dilated. There is  mild left ventricular hypertrophy. Left ventricular diastolic parameters were normal. Right Ventricle: The right ventricular size is normal. No increase in right ventricular wall thickness. Right ventricular systolic function is normal. Left Atrium: Left atrial size was normal in size. Right Atrium: Right atrial size was normal in size. Pericardium: There is no evidence of pericardial effusion. Mitral Valve: The mitral valve is normal in structure. Trivial mitral valve regurgitation. Tricuspid Valve: The tricuspid valve is normal in structure. Tricuspid valve regurgitation is mild. Aortic Valve: The aortic valve is normal in structure. Aortic valve regurgitation is not visualized. Aortic valve mean gradient measures 8.0 mmHg. Aortic valve peak gradient measures 14.8 mmHg. Pulmonic Valve: The pulmonic valve was normal in structure. Pulmonic valve regurgitation is not visualized. Aorta: The ascending aorta was not well  visualized. IAS/Shunts: No atrial level shunt detected by color flow Doppler.  LEFT VENTRICLE PLAX 2D LVIDd:         5.20 cm Diastology LVIDs:         3.70 cm LV e' medial:    9.46 cm/s LV PW:         1.20 cm LV E/e' medial:  12.6 LV IVS:        1.30 cm LV e' lateral:   11.70 cm/s                        LV E/e' lateral: 10.2  RIGHT VENTRICLE RV Basal diam:  3.60 cm RV S prime:     14.80 cm/s TAPSE (M-mode): 2.4 cm LEFT ATRIUM             Index        RIGHT ATRIUM           Index LA diam:        4.10 cm 1.79 cm/m   RA Area:     16.80 cm LA Vol (A2C):   62.2 ml 27.10 ml/m  RA Volume:   46.00 ml  20.04 ml/m LA Vol (A4C):   37.1 ml 16.17 ml/m LA Biplane Vol: 52.0 ml 22.66 ml/m  AORTIC VALVE                    PULMONIC VALVE AV Vmax:           192.50 cm/s  PV Vmax:        1.32 m/s AV Vmean:          128.000 cm/s PV Peak grad:   7.0 mmHg AV VTI:            0.407 m      RVOT Peak grad: 3 mmHg AV Peak Grad:      14.8 mmHg AV Mean Grad:      8.0 mmHg LVOT Vmax:         139.00 cm/s LVOT Vmean:        88.200 cm/s LVOT VTI:          0.277 m LVOT/AV VTI ratio: 0.68 MITRAL VALVE  TRICUSPID VALVE MV Area (PHT): 2.50 cm     TR Peak grad:   27.9 mmHg MV Decel Time: 304 msec     TR Vmax:        264.00 cm/s MV E velocity: 119.00 cm/s MV A velocity: 111.00 cm/s  SHUNTS MV E/A ratio:  1.07         Systemic VTI: 0.28 m Alwyn Pea MD Electronically signed by Alwyn Pea MD Signature Date/Time: 07/18/2022/3:56:53 PM    Final    CT TIBIA FIBULA LEFT W CONTRAST  Result Date: 07/16/2022 CLINICAL DATA:  Left lower leg redness and swelling. EXAM: CT OF THE LOWER LEFT EXTREMITY WITH CONTRAST TECHNIQUE: Multidetector CT imaging of the lower left extremity was performed according to the standard protocol following intravenous contrast administration. RADIATION DOSE REDUCTION: This exam was performed according to the departmental dose-optimization program which includes automated exposure control, adjustment of  the mA and/or kV according to patient size and/or use of iterative reconstruction technique. CONTRAST:  OMNIPAQUE IOHEXOL 300 MG/ML  SOLN COMPARISON:  Left tibia and fibula x-rays from same day. FINDINGS: Bones/Joint/Cartilage No fracture or dislocation. Degenerative changes of the left knee and ankle. No joint effusion. Ligaments Ligaments are suboptimally evaluated by CT. Muscles and Tendons Grossly intact. Soft tissue Circumferential skin thickening and soft tissue swelling of the entire lower leg. No fluid collection or subcutaneous emphysema. No soft tissue mass. IMPRESSION: 1. Circumferential skin thickening and soft tissue swelling of the entire lower leg, consistent with cellulitis. No abscess or subcutaneous emphysema. Electronically Signed   By: Obie Dredge M.D.   On: 07/16/2022 15:33   DG Chest Port 1 View  Result Date: 07/16/2022 CLINICAL DATA:  Questionable sepsis - evaluate for abnormality EXAM: PORTABLE CHEST 1 VIEW COMPARISON:  CXR 12/31/16 FINDINGS: No pleural effusion. No pneumothorax. Borderline cardiomegaly. No focal airspace opacity. There are prominent bilateral interstitial opacities could represent pulmonary venous congestion. No radiographically apparent displaced rib fractures. Visualized upper abdomen is unremarkable. IMPRESSION: Borderline cardiomegaly with prominent bilateral interstitial opacities, which could represent pulmonary venous congestion. Electronically Signed   By: Lorenza Cambridge M.D.   On: 07/16/2022 14:19   DG Tibia/Fibula Left  Result Date: 07/16/2022 CLINICAL DATA:  infection EXAM: LEFT TIBIA AND FIBULA - 2 VIEW COMPARISON:  None Available. FINDINGS: No cortical erosion or destruction. There is no evidence of fracture or other focal bone lesions. Moderate tricompartmental degenerative changes of the knee. Subcutaneus soft tissue edema. Dermal/vascular calcifications. IMPRESSION: 1. No acute displaced fracture or dislocation. 2. No radiographic findings to  suggest osteomyelitis. Limited evaluation due to overlapping osseous structures and overlying soft tissues. If high clinical concern, please consider MRI for further evaluation (with intravenous contrast if GFR greater than 30). Electronically Signed   By: Tish Frederickson M.D.   On: 07/16/2022 14:13   US Venous Img Lower Unilateral Left (DVT)  Result Date: 07/15/2022 CLINICAL DATA:  Left lower extremity pain and edema. Evaluate for DVT. EXAM: LEFT LOWER EXTREMITY VENOUS DOPPLER ULTRASOUND TECHNIQUE: Gray-scale sonography with graded compression, as well as color Doppler and duplex ultrasound were performed to evaluate the lower extremity deep venous systems from the level of the common femoral vein and including the common femoral, femoral, profunda femoral, popliteal and calf veins including the posterior tibial, peroneal and gastrocnemius veins when visible. The superficial great saphenous vein was also interrogated. Spectral Doppler was utilized to evaluate flow at rest and with distal augmentation maneuvers in the common femoral, femoral and popliteal  veins. COMPARISON:  None Available. FINDINGS: Contralateral Common Femoral Vein: Respiratory phasicity is normal and symmetric with the symptomatic side. No evidence of thrombus. Normal compressibility. Common Femoral Vein: No evidence of thrombus. Normal compressibility, respiratory phasicity and response to augmentation. Saphenofemoral Junction: No evidence of thrombus. Normal compressibility and flow on color Doppler imaging. Profunda Femoral Vein: No evidence of thrombus. Normal compressibility and flow on color Doppler imaging. Femoral Vein: No evidence of thrombus. Normal compressibility, respiratory phasicity and response to augmentation. Popliteal Vein: No evidence of thrombus. Normal compressibility, respiratory phasicity and response to augmentation. Calf Veins: No evidence of thrombus. Normal compressibility and flow on color Doppler imaging.  Superficial Great Saphenous Vein: No evidence of thrombus. Normal compressibility. Other Findings: Note is made of a prominent though non pathologically enlarged left inguinal lymph node. The lymph node is not enlarged by size criteria measuring 1.2 cm in greatest short axis diameter and maintains a benign fatty hilum without cortical thickening (image 34), presumably reactive in etiology. Note is made of a mildly prominent though widely patent superficial varicosity at the level of the left lower leg and calf (images 39 through 42), without evidence superficial thrombophlebitis. IMPRESSION: 1. No evidence of DVT within the left lower extremity. 2. Mildly prominent though widely patent superficial varicosity at the level of the left lower leg calf. No evidence of superficial thrombophlebitis. Electronically Signed   By: Simonne Come M.D.   On: 07/15/2022 14:00   MR Abdomen W Wo Contrast  Result Date: 06/25/2022 CLINICAL DATA:  Follow-up left renal mass and cystic pancreatic lesion. Refractory anemia. EXAM: MRI ABDOMEN WITHOUT AND WITH CONTRAST TECHNIQUE: Multiplanar multisequence MR imaging of the abdomen was performed both before and after the administration of intravenous contrast. CONTRAST:  10mL GADAVIST GADOBUTROL 1 MMOL/ML IV SOLN COMPARISON:  06/26/2021 and 04/12/2020 FINDINGS: Lower chest: No acute findings. Hepatobiliary: No hepatic masses identified. Diffuse hepatic iron deposition again demonstrated. Gallbladder is unremarkable. No evidence of biliary ductal dilatation. Pancreas: 6 mm cystic lesion in the pancreatic head no significant change with probable communication with the main pancreatic duct. No evidence of pancreatic ductal dilatation Spleen: Within normal limits in size stable 2 cm splenic lesion which shows progressive peripheral nodular contrast enhancement, consistent with benign hemangioma. Adrenals/Urinary Tract: Normal adrenal glands. A few tiny benign Bosniak category 1 renal cysts are  again seen bilaterally. A subcapsular Bosniak category 2 hemorrhagic cyst is seen in the lateral lower pole of the left kidney. A subcapsular lesion is again seen in the anterior midpole of the left kidney. This measures 1.5 x 1.2 cm on image 58/36, and shows evidence of contrast enhancement. This is unchanged and suspicious for a small renal cell carcinoma. No evidence of hydronephrosis. Stomach/Bowel: Small hiatal hernia again noted. Left-sided colonic diverticulosis also noted. Vascular/Lymphatic: No pathologically enlarged lymph nodes identified. No acute vascular findings. Other:  None. Musculoskeletal:  No suspicious bone lesions identified. IMPRESSION: Stable 1.5 cm enhancing subcapsular lesion in the anterior midpole of the left kidney, suspicious for renal cell carcinoma. No evidence of abdominal metastatic disease. 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 Recommend continued follow-up by MRI in 2 years. This recommendation follows ACR consensus guidelines: Management of Incidental Pancreatic Cysts: A White Paper of the ACR Incidental Findings Committee. J Am Coll Radiol 2017;14:911-923. 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 Small hiatal hernia. Left colonic diverticulosis. Electronically Signed   By: Danae Orleans M.D.   On: 06/25/2022  12:04    Labs: Basic Metabolic Panel: Recent Labs  Lab 07/15/22 1103 07/16/22 1159 07/17/22 0422 07/18/22 0447 07/19/22 0450  NA 136 129* 133* 135 130*  K 4.7 4.0 3.6 3.9 3.5  CL 99 98 104 106 102  CO2 21 24 24 22 22   GLUCOSE 121* 152* 109* 103* 105*  BUN 27 36* 25* 17 14  CREATININE 1.65* 1.54* 1.24 1.05 0.99  CALCIUM 8.8 8.2* 7.9* 8.2* 7.9*   CBC: Recent Labs  Lab 07/15/22 1103 07/16/22 1159 07/17/22 0422 07/18/22 0447 07/19/22 0450  WBC 41.5* 19.4* 9.6 6.6 5.2  NEUTROABS 38.8* 17.3* 7.9*  --   --   HGB 9.5* 8.6* 7.2* 6.7* 7.2*  HCT 29.1* 27.3* 22.9*  21.2* 23.5*  MCV 103* 107.1* 107.0* 108.2* 106.3*  PLT 375 275 211 189 211   Microbiology: Results for orders placed or performed during the hospital encounter of 07/16/22  Blood Culture (routine x 2)     Status: None   Collection Time: 07/16/22  1:43 PM   Specimen: BLOOD  Result Value Ref Range Status   Specimen Description BLOOD LEFT ANTECUBITAL  Final   Special Requests   Final    BOTTLES DRAWN AEROBIC AND ANAEROBIC Blood Culture adequate volume   Culture   Final    NO GROWTH 5 DAYS Performed at [facility], [address] [place], [place] [address]    Report Status 07/21/2022 FINAL  Final  Blood Culture (routine x 2)     Status: None   Collection Time: 07/16/22  1:43 PM   Specimen: BLOOD  Result Value Ref Range Status   Specimen Description BLOOD RIGHT ANTECUBITAL  Final   Special Requests   Final    BOTTLES DRAWN AEROBIC AND ANAEROBIC Blood Culture adequate volume   Culture   Final    NO GROWTH 5 DAYS Performed at [facility], [address], [place], [place] [address]    Report Status 07/21/2022 FINAL  Final  MRSA Next Gen by PCR, Nasal     Status: Abnormal   Collection Time: 07/16/22  6:31 PM   Specimen: Nasal Mucosa; Nasal Swab  Result Value Ref Range Status   MRSA by PCR Next Gen DETECTED (A) NOT DETECTED Final    Comment: RESULT CALLED TO, READ BACK BY AND VERIFIED WITH: Adron Bene, RN 2000 07/16/22 GM (NOTE) The GeneXpert MRSA Assay (FDA approved for NASAL specimens only), is one component of a comprehensive MRSA colonization surveillance program. It is not intended to diagnose MRSA infection nor to guide or monitor treatment for MRSA infections. Test performance is not FDA approved in patients less than 65 years old. Performed at [facility], [address], [place], [place] [address]    Time coordinating discharge: Over 30 minutes  Leeroy Bock, MD  [facility] 07/19/2022, 9:15 AM

## 2022-07-19 NOTE — Discharge Instructions (Signed)
Continue your antibiotic until complete. If your swelling remains significant after completing your treatment, please follow up with your PCP Keep your follow up with hematology for rechecking your blood levels.

## 2022-07-20 ENCOUNTER — Telehealth: Payer: Self-pay | Admitting: *Deleted

## 2022-07-20 LAB — CULTURE, BLOOD (ROUTINE X 2)
Culture: NO GROWTH
Culture: NO GROWTH
Special Requests: ADEQUATE

## 2022-07-20 NOTE — Transitions of Care (Post Inpatient/ED Visit) (Signed)
   07/20/2022  Name: Brett Boone MRN: [id number] DOB: [birth year]  Today's TOC FU Call Status: Today's TOC FU Call Status:: Unsuccessul Call (1st Attempt) Unsuccessful Call (1st Attempt) Date: 07/20/22  Attempted to reach the patient regarding the most recent Inpatient/ED visit.  Follow Up Plan: Additional outreach attempts will be made to reach the patient to complete the Transitions of Care (Post Inpatient/ED visit) call.   Gean Maidens BSN RN [facility] [phone]

## 2022-07-20 NOTE — Transitions of Care (Post Inpatient/ED Visit) (Signed)
07/20/2022  Name: Brett Boone MRN: [id number] DOB: 13-Jul-1945  Today's TOC FU Call Status: Today's TOC FU Call Status:: Successful TOC FU Call Competed TOC FU Call Complete Date: 07/20/22  Transition Care Management Follow-up Telephone Call Date of Discharge: 07/19/22 Discharge Facility: [facility]) Type of Discharge: Inpatient Admission Primary Inpatient Discharge Diagnosis:: Severe Sepsis How have you been since you were released from the hospital?: Better Any questions or concerns?: No  Items Reviewed: Did you receive and understand the discharge instructions provided?: Yes Medications obtained,verified, and reconciled?: Yes (Medications Reviewed) Any new allergies since your discharge?: No Dietary orders reviewed?: No Do you have support at home?: Yes People in Home: spouse Name of Support/Comfort Primary Source: Gavin Pound  Medications Reviewed Today: Medications Reviewed Today     Reviewed by Luella Cook, RN (Case Manager) on 07/20/22 at 1530  Med List Status: <None>   Medication Order Taking? Sig Documenting Provider Last Dose Status Informant  acetaminophen (TYLENOL) 325 MG tablet [id number] Yes Take 650 mg by mouth every 6 (six) hours as needed. [provider] Taking Active Spouse/Significant Other  Calcium Carb-Cholecalciferol 600-400 MG-UNIT TABS [id number] Yes Take by mouth. [provider] Taking Active Spouse/Significant Other           Med Note Sueanne Margarita   Tue Nov 05, 2020  9:49 AM)    cephALEXin (KEFLEX) 500 MG capsule [id number] Yes Take 1 capsule (500 mg total) by mouth 3 (three) times daily for 5 days. Leeroy Bock, MD Taking Active   Darbepoetin Alfa (ARANESP) 25 MCG/0.42ML SOSY injection [id number] Yes Inject 25 mcg into the skin. [provider] Taking Active Spouse/Significant Other  meloxicam (MOBIC) 15 MG tablet [id number] Yes Take 1 tablet (15 mg total) by mouth daily.  Remo Lipps, PA Taking Active Spouse/Significant Other  mupirocin ointment (BACTROBAN) 2 % [id number] Yes Apply 1 Application topically 2 (two) times daily. Duanne Limerick, MD Taking Active Spouse/Significant Other  tadalafil (CIALIS) 5 MG tablet [id number] Yes TAKE ONE TABLET BY MOUTH DAILY Sondra Come, MD Taking Active Spouse/Significant Other            Home Care and Equipment/Supplies: Were Home Health Services Ordered?: NA Any new equipment or medical supplies ordered?: NA  Functional Questionnaire: Do you need assistance with bathing/showering or dressing?: No Do you need assistance with meal preparation?: No Do you need assistance with eating?: No Do you have difficulty maintaining continence: No Do you need assistance with getting out of bed/getting out of a chair/moving?: No Do you have difficulty managing or taking your medications?: No  Follow up appointments reviewed: PCP Follow-up appointment confirmed?: Yes Date of PCP follow-up appointment?: 07/21/22 Follow-up Provider: Dr Elizabeth Sauer Specialist [facility] Follow-up appointment confirmed?: Yes Date of Specialist follow-up appointment?: 07/22/22 Follow-Up Specialty Provider:: [facility] at [facility] center [id number], [id number] Injection , [id number] Dr Smith Robert, [id number] Urology Do you need transportation to your follow-up appointment?: No Do you understand care options if your condition(s) worsen?: Yes-patient verbalized understanding  SDOH Interventions Today    Flowsheet Row Most Recent Value  SDOH Interventions   Food Insecurity Interventions Intervention Not Indicated  Housing Interventions Intervention Not Indicated  Transportation Interventions Intervention Not Indicated, Patient Resources (Friends/Family)      Interventions Today    Flowsheet Row Most Recent Value  General Interventions   General Interventions Discussed/Reviewed General Interventions Discussed, General Interventions Reviewed, Doctor Visits   Doctor Visits Discussed/Reviewed Doctor Visits Discussed, Doctor Visits Reviewed  Pharmacy Interventions   Pharmacy Dicussed/Reviewed Pharmacy Topics Discussed, Pharmacy Topics Reviewed      TOC Interventions Today    Flowsheet Row Most Recent Value  TOC Interventions   TOC Interventions Discussed/Reviewed TOC Interventions Discussed, TOC Interventions Reviewed        Gean Maidens BSN RN [facility] [phone]

## 2022-07-21 ENCOUNTER — Encounter: Payer: Self-pay | Admitting: Family Medicine

## 2022-07-21 ENCOUNTER — Ambulatory Visit (INDEPENDENT_AMBULATORY_CARE_PROVIDER_SITE_OTHER): Payer: Medicare Other | Admitting: Family Medicine

## 2022-07-21 VITALS — BP 124/68 | HR 76 | Ht 70.0 in | Wt 261.0 lb

## 2022-07-21 DIAGNOSIS — N179 Acute kidney failure, unspecified: Secondary | ICD-10-CM

## 2022-07-21 DIAGNOSIS — A419 Sepsis, unspecified organism: Secondary | ICD-10-CM

## 2022-07-21 DIAGNOSIS — E871 Hypo-osmolality and hyponatremia: Secondary | ICD-10-CM

## 2022-07-21 DIAGNOSIS — L03116 Cellulitis of left lower limb: Secondary | ICD-10-CM | POA: Diagnosis not present

## 2022-07-21 LAB — CULTURE, BLOOD (ROUTINE X 2): Special Requests: ADEQUATE

## 2022-07-21 NOTE — Progress Notes (Signed)
Date:  07/21/2022   Name:  Brett Boone   DOB:  11-Dec-1945   MRN:  [id number]   Chief Complaint: No chief complaint on file.  Follow up Hospitalization  Patient was admitted to [facility] on 6/20 and discharged on 6/23. He was treated for sepsis/cellulitis. Treatment for this included antibiotics. Telephone follow up was done on 6/24 He reports excellent compliance with treatment. He reports this condition is improved.  ----------------------------------------------------------------------------------------- -     Rash This is a new (cellulitis) problem. The current episode started in the past 7 days. The affected locations include the left lower leg. The rash is characterized by redness. Associated with: rose thorn. Pertinent negatives include no cough, diarrhea, fever, joint pain, shortness of breath or sore throat. Past treatments include nothing (antibiotics). The treatment provided mild relief.    Lab Results  Component Value Date   NA 130 (L) 07/19/2022   K 3.5 07/19/2022   CO2 22 07/19/2022   GLUCOSE 105 (H) 07/19/2022   BUN 14 07/19/2022   CREATININE 0.99 07/19/2022   CALCIUM 7.9 (L) 07/19/2022   EGFR 43 (L) 07/15/2022   GFRNONAA >60 07/19/2022   Lab Results  Component Value Date   CHOL 62 (L) 07/25/2021   HDL 47 07/25/2021   LDLCALC 3 07/25/2021   TRIG 34 07/25/2021   CHOLHDL 1.4 01/04/2018   Lab Results  Component Value Date   TSH 1.275 08/15/2019   Lab Results  Component Value Date   HGBA1C 4.4 (L) 07/17/2022   Lab Results  Component Value Date   WBC 5.2 07/19/2022   HGB 7.2 (L) 07/19/2022   HCT 23.5 (L) 07/19/2022   MCV 106.3 (H) 07/19/2022   PLT 211 07/19/2022   Lab Results  Component Value Date   ALT 19 07/19/2022   AST 20 07/19/2022   GGT 35 06/24/2022   ALKPHOS 42 07/19/2022   BILITOT 0.9 07/19/2022   No results found for: "25OHVITD2", "25OHVITD3", "VD25OH"   Review of Systems  Constitutional:  Negative for chills and fever.   HENT:  Negative for drooling, ear discharge, ear pain and sore throat.   Respiratory:  Negative for cough, shortness of breath and wheezing.   Cardiovascular:  Positive for leg swelling.  Gastrointestinal:  Negative for abdominal pain, blood in stool, constipation, diarrhea and nausea.  Endocrine: Negative for polydipsia.  Genitourinary:  Negative for dysuria, frequency, hematuria and urgency.  Musculoskeletal:  Negative for back pain, joint pain, myalgias and neck pain.  Skin:  Positive for rash.       errythema  Allergic/Immunologic: Negative for environmental allergies.  Neurological:  Negative for dizziness and headaches.  Hematological:  Does not bruise/bleed easily.  Psychiatric/Behavioral:  Negative for suicidal ideas. The patient is not nervous/anxious.     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   Elevated bilirubin 04/01/2020   Kidney lesion, native, left 12/31/2019   Occult blood positive stool    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   Macrocytic anemia 08/15/2019  Weight loss 08/15/2019   Elevated ferritin 08/15/2019   Erectile dysfunction 07/25/2019   Class 2 severe obesity due to excess calories with serious comorbidity and body mass index (BMI) of 36.0 to 36.9 in adult [facility]) 07/19/2019   History of adenomatous polyp of colon    Epigastric hernia 07/18/2014    Allergies  Allergen Reactions   Amoxicillin-Pot Clavulanate Hives and  Nausea And Vomiting    Past Surgical History:  Procedure Laterality Date   CATARACT EXTRACTION W/PHACO Right 03/09/2017   Procedure: CATARACT EXTRACTION PHACO AND INTRAOCULAR LENS PLACEMENT (IOC) RIGHT;  Surgeon: Nevada Crane, MD;  Location: [facility];  Service: Ophthalmology;  Laterality: Right;   CATARACT EXTRACTION W/PHACO Left 08/25/2021   Procedure: CATARACT EXTRACTION PHACO AND INTRAOCULAR LENS PLACEMENT (IOC) LEFT;  Surgeon: Nevada Crane, MD;  Location: [facility];  Service: Ophthalmology;  Laterality: Left;   CATARACT EXTRACTION W/PHACO Left 10/13/2021   Procedure: CATARACT EXTRACTION PHACO AND INTRAOCULAR LENS PLACEMENT (IOC) LEFT;  Surgeon: Nevada Crane, MD;  Location: [facility];  Service: Ophthalmology;  Laterality: Left;  8.12 0:52.0   COLONOSCOPY  2013   normal- cleared for 5 yrs- Dr Theodoro Parma   COLONOSCOPY WITH PROPOFOL N/A 02/17/2017   Procedure: COLONOSCOPY WITH PROPOFOL;  Surgeon: Toney Reil, MD;  Location: [facility];  Service: Endoscopy;  Laterality: N/A;   COLONOSCOPY WITH PROPOFOL N/A 10/12/2019   Procedure: COLONOSCOPY WITH BIOPSY;  Surgeon: Toney Reil, MD;  Location: [facility];  Service: Endoscopy;  Laterality: N/A;   EPIGASTRIC HERNIA REPAIR N/A 07/06/2014   Procedure: HERNIA REPAIR EPIGASTRIC ADULT;  Surgeon: Duwaine Maxin, MD;  Location: [facility] ORS;  Service: General;  Laterality: N/A;   HERNIA REPAIR Bilateral    INSERTION OF MESH N/A 07/06/2014   Procedure: INSERTION OF MESH;  Surgeon: Duwaine Maxin, MD;  Location: [facility] ORS;  Service: General;  Laterality: N/A;   IR RADIOLOGIST EVAL & MGMT  11/07/2019   IR RADIOLOGIST EVAL & MGMT  04/25/2020   IR RADIOLOGIST EVAL & MGMT  12/05/2020   IR RADIOLOGIST EVAL & MGMT  06/30/2021   POLYPECTOMY  02/17/2017   Procedure: POLYPECTOMY;  Surgeon: Toney Reil, MD;  Location: [facility];  Service: Endoscopy;;   POLYPECTOMY N/A  10/12/2019   Procedure: POLYPECTOMY;  Surgeon: Toney Reil, MD;  Location: [facility];  Service: Endoscopy;  Laterality: N/A;   SHOULDER ARTHROSCOPY Left 2008   SINUS EXPLORATION      Social History   Tobacco Use   Smoking status: Former    Packs/day: 0.25    Years: 50.00    Additional pack years: 0.00    Total pack years: 12.50    Types: Cigarettes    Quit date: 05/03/2018    Years since quitting: 4.2   Smokeless tobacco: Former   Tobacco comments:    pt quit smoking  Vaping Use   Vaping Use: Never used  Substance Use Topics   Alcohol use: Yes    Comment: Occasional -1x/month   Drug use: No     Medication list has been reviewed and updated.  No outpatient medications have been marked as taking for the 07/21/22 encounter (Appointment) with Duanne Limerick, MD.       07/15/2022   10:14 AM 12/12/2021   11:17 AM 11/27/2021    3:23 PM 08/26/2021    2:02 PM  GAD 7 : Generalized Anxiety Score  Nervous, Anxious, on Edge 0 0 0 0  Control/stop worrying 0 0 0 0  Worry too much - different things 0 0 0 0  Trouble relaxing 0 0 0 0  Restless 0 0 0 0  Easily annoyed or irritable 0 0 0 0  Afraid - awful might happen 0 0 0 0  Total GAD 7 Score 0 0 0 0  Anxiety Difficulty Not difficult at all Not difficult at all Not difficult at all Not difficult at all       07/15/2022   10:14 AM 03/12/2022    2:03 PM 12/12/2021   11:17 AM  Depression screen PHQ 2/9  Decreased Interest 0 0 0  Down, Depressed, Hopeless 0 0 0  PHQ - 2 Score 0 0 0  Altered sleeping 0 0 0  Tired, decreased energy 0 0 0  Change in appetite 0 0 0  Feeling bad or failure about yourself  0 0 0  Trouble concentrating 0 0 0  Moving slowly or fidgety/restless 0 0 0  Suicidal thoughts 0 0 0  PHQ-9 Score 0 0 0  Difficult doing work/chores Not difficult at all Not difficult at all Not difficult at all    BP Readings from Last 3 Encounters:  07/19/22 135/76  07/15/22 122/76  07/08/22 131/76     Physical Exam Vitals and nursing note reviewed.  HENT:     Head: Normocephalic.     Right Ear: Tympanic membrane and external ear normal.     Left Ear: Tympanic membrane and external ear normal.     Nose: Nose normal.  Eyes:     Pupils: Pupils are equal, round, and reactive to light.  Neck:     Thyroid: No thyromegaly.     Vascular: No JVD.     Trachea: No tracheal deviation.  Cardiovascular:     Rate and Rhythm: Normal rate and regular rhythm.     Heart sounds: Normal heart sounds. No murmur heard.    No friction rub. No gallop.  Pulmonary:     Effort: No respiratory distress.     Breath sounds: Normal breath sounds. No wheezing, rhonchi or rales.  Abdominal:     General: Bowel sounds are normal.     Palpations: Abdomen is soft.  Musculoskeletal:        General: No tenderness. Normal range of motion.     Cervical back: Normal range of motion and neck supple.  Lymphadenopathy:     Cervical: No cervical adenopathy.  Skin:    General: Skin is warm.     Findings: No rash.  Neurological:     Mental Status: He is alert and oriented to person, place, and time.     Cranial Nerves: No cranial nerve deficit.     Deep Tendon Reflexes: Reflexes are normal and symmetric.     Wt Readings from Last 3 Encounters:  07/16/22 250 lb (113.4 kg)  07/15/22 253 lb (114.8 kg)  06/12/22 264 lb 1.6 oz (119.8 kg)    [facility] [place]  PR[facility] CARE & SPORTS MEDICINE AT [facility]                                   Transitional Care Clinic   [facility] Discharge Acute Issues Care Follow Up  Patient Demographics  AREG BIALAS Boone, is a 77 y.o. male  DOB 23-Jul-1945  MRN [id number].  Primary MD  Duanne Limerick, MD   Reason for TCC follow Up -patient has follow-up from sepsis for blood pressure check/repeat renal panel to assess AKI and electrolytes in particular sodium/in the  continue hydration to copious amounts due to inadequate oral intake over the past 48 hours.   Past Medical History:  Diagnosis Date   Anemia    Heart murmur    Hydrocele    MDS (myelodysplastic syndrome) (HCC)    Rotator cuff tear arthropathy of right shoulder 08/01/2020   Venous stasis dermatitis of both lower extremities     Past Surgical History:  Procedure Laterality Date   CATARACT EXTRACTION W/PHACO Right 03/09/2017   Procedure: CATARACT EXTRACTION PHACO AND INTRAOCULAR LENS PLACEMENT (IOC) RIGHT;  Surgeon: Nevada Crane, MD;  Location: [facility];  Service: Ophthalmology;  Laterality: Right;   CATARACT EXTRACTION W/PHACO Left 08/25/2021   Procedure: CATARACT EXTRACTION PHACO AND INTRAOCULAR LENS PLACEMENT (IOC) LEFT;  Surgeon: Nevada Crane, MD;  Location: [facility];  Service: Ophthalmology;  Laterality: Left;   CATARACT EXTRACTION W/PHACO Left 10/13/2021   Procedure: CATARACT EXTRACTION PHACO AND INTRAOCULAR LENS PLACEMENT (IOC) LEFT;  Surgeon: Nevada Crane, MD;  Location: [facility];  Service: Ophthalmology;  Laterality: Left;  8.12 0:52.0   COLONOSCOPY  2013   normal- cleared for 5 yrs- Dr Theodoro Parma   COLONOSCOPY WITH PROPOFOL N/A 02/17/2017   Procedure: COLONOSCOPY WITH PROPOFOL;  Surgeon: Toney Reil, MD;  Location: [facility];  Service: Endoscopy;  Laterality: N/A;   COLONOSCOPY WITH PROPOFOL N/A 10/12/2019   Procedure: COLONOSCOPY WITH BIOPSY;  Surgeon: Toney Reil, MD;  Location: [facility];  Service: Endoscopy;  Laterality: N/A;   EPIGASTRIC HERNIA REPAIR N/A 07/06/2014   Procedure: HERNIA REPAIR EPIGASTRIC ADULT;  Surgeon: Duwaine Maxin, MD;  Location: [facility] ORS;  Service: General;  Laterality: N/A;   HERNIA REPAIR Bilateral    INSERTION OF MESH N/A 07/06/2014   Procedure: INSERTION OF MESH;  Surgeon: Duwaine Maxin, MD;  Location: [facility] OR[facility];  Service: General;  Laterality: N/A;   IR  RADIOLOGIST EVAL & MGMT  11/07/2019   IR RADIOLOGIST EVAL & MGMT  04/25/2020   IR RADIOLOGIST EVAL & MGMT  12/05/2020   IR RADIOLOGIST EVAL & MGMT  06/30/2021   POLYPECTOMY  02/17/2017   Procedure: POLYPECTOMY;  Surgeon: Toney Reil, MD;  Location: [facility];  Service: Endoscopy;;   POLYPECTOMY N/A 10/12/2019   Procedure: POLYPECTOMY;  Surgeon: Toney Reil, MD;  Location: [facility];  Service: Endoscopy;  Laterality: N/A;   SHOULDER ARTHROSCOPY Left 2008   SINUS EXPLORATION      Recent HPI and Hospital course patient is doing well status post discharge wants to repeat the past disastrous we can have going to [facility] and attending multiple children softball games in the sun which I told him that this is what got him into trouble last time and that he needs to stay at home elevate leg stay cool hydrate and there another day will come. Post Hospital acute care issue to be followed in this clinic: Patient had AKI with hyponatremia we will check       Subjective:   Vilinda Flake today has, No headache, No chest pain, No abdominal pain - No Nausea, No new weakness tingling or numbness, No Cough - SOB.  Patient has had  decreasing pain and swelling of the left lower leg which is gradually improving but still has some erythema and swelling as previously noted.  This is resolving but has not resolved and patient has been instructed to continue to elevate leg continue antibiotics and do as little activity involving the leg with walking only on an as-needed basis through the weekend.        Objective:   Vitals:   07/21/22 1325  BP: 124/68  Pulse: 76  SpO2: 97%  Weight: 261 lb (118.4 kg)  Height: 5\' 10"  (1.778 m)    Wt Readings from Last 3 Encounters:  07/21/22 261 lb (118.4 kg)  07/16/22 250 lb (113.4 kg)  07/15/22 253 lb (114.8 kg)    Allergies as of 07/21/2022       Reactions   Amoxicillin-pot Clavulanate Hives, Nausea And Vomiting         Medication List        Accurate as of July 21, 2022  4:27 PM. If you have any questions, ask your nurse or doctor.          acetaminophen 325 MG tablet Commonly known as: TYLENOL Take 650 mg by mouth every 6 (six) hours as needed.   Calcium Carb-Cholecalciferol 600-400 MG-UNIT Tabs Take by mouth.   cephALEXin 500 MG capsule Commonly known as: KEFLEX Take 1 capsule (500 mg total) by mouth 3 (three) times daily for 5 days.   Darbepoetin Alfa 25 MCG/0.42ML Sosy injection Commonly known as: ARANESP Inject 25 mcg into the skin.   meloxicam 15 MG tablet Commonly known as: MOBIC Take 1 tablet (15 mg total) by mouth daily.   mupirocin ointment 2 % Commonly known as: BACTROBAN Apply 1 Application topically 2 (two) times daily.   tadalafil 5 MG tablet Commonly known as: CIALIS TAKE ONE TABLET BY MOUTH DAILY         Physical Exam: Constitutional: Patient appears well-developed and well-nourished. Not in obvious distress. HENT: Normocephalic, atraumatic, External right and left ear normal. Oropharynx is clear and moist.  Eyes: Conjunctivae and EOM are normal. PERRLA, no scleral icterus. Neck: Normal ROM. Neck supple. No JVD. No tracheal deviation. No thyromegaly. CVS: RRR, S1/S2 +, no murmurs, no gallops, no carotid bruit.  Pulmonary: Effort and breath sounds normal, no stridor, rhonchi, wheezes, rales.  Abdominal: Soft. BS +, no distension, tenderness, rebound or guarding.  Musculoskeletal: Normal range of motion. No edema and no tenderness.  Lymphadenopathy: No lymphadenopathy noted, cervical, inguinal or axillary Neuro: Alert. Normal reflexes, muscle tone coordination. No cranial nerve deficit. Skin: Skin is warm and dry. No rash noted. Not diaphoretic. No erythema. No pallor. Psychiatric: Normal mood and affect. Behavior, judgment, thought content normal.   Data Review   Micro Results Recent Results (from the past 240 hour(s))  Blood Culture (routine x 2)      Status: None   Collection Time: 07/16/22  1:43 PM   Specimen: BLOOD  Result Value Ref Range Status   Specimen Description BLOOD LEFT ANTECUBITAL  Final   Special Requests   Final    BOTTLES DRAWN AEROBIC AND ANAEROBIC Blood Culture adequate volume   Culture   Final    NO GROWTH 5 DAYS Performed at [facility], [address] [place], [place] [address]    Report Status 07/21/2022 FINAL  Final  Blood Culture (routine x 2)     Status: None   Collection Time: 07/16/22  1:43 PM   Specimen: BLOOD  Result Value Ref Range  Status   Specimen Description BLOOD RIGHT ANTECUBITAL  Final   Special Requests   Final    BOTTLES DRAWN AEROBIC AND ANAEROBIC Blood Culture adequate volume   Culture   Final    NO GROWTH 5 DAYS Performed at [facility], [address] [place], [place] [address]    Report Status 07/21/2022 FINAL  Final  MRSA Next Gen by PCR, Nasal     Status: Abnormal   Collection Time: 07/16/22  6:31 PM   Specimen: Nasal Mucosa; Nasal Swab  Result Value Ref Range Status   MRSA by PCR Next Gen DETECTED (A) NOT DETECTED Final    Comment: RESULT CALLED TO, READ BACK BY AND VERIFIED WITH: Adron Bene, RN 2000 07/16/22 GM (NOTE) The GeneXpert MRSA Assay (FDA approved for NASAL specimens only), is one component of a comprehensive MRSA colonization surveillance program. It is not intended to diagnose MRSA infection nor to guide or monitor treatment for MRSA infections. Test performance is not FDA approved in patients less than 30 years old. Performed at [facility], [address] [place], [place] [address]      CBC Recent Labs  Lab 07/15/22 1103 07/16/22 1159 07/17/22 0422 07/18/22 0447 07/19/22 0450  WBC 41.5* 19.4* 9.6 6.6 5.2  HGB 9.5* 8.6* 7.2* 6.7* 7.2*  HCT 29.1* 27.3* 22.9* 21.2* 23.5*  PLT 375 275 211 189 211  MCV 103* 107.1* 107.0* 108.2* 106.3*  MCH 33.5* 33.7 33.6 34.2* 32.6  MCHC 32.6 31.5 31.4 31.6 30.6  RDW 21.8* 23.3*  23.4* 22.9* 22.5*  LYMPHSABS 0.7 0.8 0.8  --   --   MONOABS  --  1.2* 0.8  --   --   EOSABS 0.0 0.0 0.0  --   --   BASOSABS 0.2 0.1 0.0  --   --     Chemistries  Recent Labs  Lab 07/15/22 1103 07/16/22 1159 07/17/22 0422 07/18/22 0447 07/19/22 0450  NA 136 129* 133* 135 130*  K 4.7 4.0 3.6 3.9 3.5  CL 99 98 104 106 102  CO2 21 24 24 22 22   GLUCOSE 121* 152* 109* 103* 105*  BUN 27 36* 25* 17 14  CREATININE 1.65* 1.54* 1.24 1.05 0.99  CALCIUM 8.8 8.2* 7.9* 8.2* 7.9*  AST 24 21 20 21 20   ALT 15 16 15 17 19   ALKPHOS 50 42 35* 40 42  BILITOT 3.3* 3.4* 1.9* 1.1 0.9   ------------------------------------------------------------------------------------------------------------------ estimated creatinine clearance is 83.2 mL/min (by C-G formula based on SCr of 0.99 mg/dL). ------------------------------------------------------------------------------------------------------------------ No results for input(s): "HGBA1C" in the last 72 hours. ------------------------------------------------------------------------------------------------------------------ No results for input(s): "CHOL", "HDL", "LDLCALC", "TRIG", "CHOLHDL", "LDLDIRECT" in the last 72 hours. ------------------------------------------------------------------------------------------------------------------ No results for input(s): "TSH", "T4TOTAL", "T3FREE", "THYROIDAB" in the last 72 hours.  Invalid input(s): "FREET3" ------------------------------------------------------------------------------------------------------------------ No results for input(s): "VITAMINB12", "FOLATE", "FERRITIN", "TIBC", "IRON", "RETICCTPCT" in the last 72 hours.  Coagulation profile Recent Labs  Lab 07/16/22 1343 07/17/22 0422  INR 1.6* 1.5*    No results for input(s): "DDIMER" in the last 72 hours.  Cardiac Enzymes No results for input(s): "CKMB", "TROPONINI", "MYOGLOBIN" in the last 168 hours.  Invalid input(s):  "CK" ------------------------------------------------------------------------------------------------------------------ Invalid input(s): "POCBNP"  Time spent in minutes 30    Elizabeth Sauer M.D on 07/21/2022 at 4:27 PM   **Disclaimer: This note may have been dictated with voice recognition software. Similar sounding words can inadvertently be transcribed and this note may contain transcription errors which may not have been corrected upon publication  of note.**    Assessment and Plan:  1. Sepsis, due to unspecified organism, unspecified whether acute organ dysfunction present (HCC) Onset.  Resolving.  Stable status post antibiotics which he is continuing with current dosing of cephalexin 500 mg 3 times a day for 5 days.  We will check CMP for current status. - Comprehensive metabolic panel  2. AKI (acute kidney injury) (HCC) New onset.  Resolving.  Patient is hydrating with fluids and we will check CMP for current creatinine and GFR. - Comprehensive metabolic panel  3. Cellulitis of left lower extremity New onset.  Resolving.  There is still some swelling and some erythema but decreased relative to admission date earlier last week on Wednesday.  Patient is to see hematology at which time they would probably do a CBC to see if white count remains in normal range as the previous reading was noted. - Comprehensive metabolic panel  4. Hyponatremia New onset.  Gradually resolving.  Will check CMP for current electrolytes and GFR. - Comprehensive metabolic panel  5. Hyperbilirubinemia Chronic.  Controlled.  Stable.  Will check hepatic panel with bilirubin with CMP   Elizabeth Sauer, MD

## 2022-07-22 ENCOUNTER — Other Ambulatory Visit: Payer: Self-pay

## 2022-07-22 ENCOUNTER — Inpatient Hospital Stay: Payer: Medicare Other

## 2022-07-22 ENCOUNTER — Encounter: Payer: Self-pay | Admitting: Oncology

## 2022-07-22 VITALS — BP 129/63

## 2022-07-22 DIAGNOSIS — D461 Refractory anemia with ring sideroblasts: Secondary | ICD-10-CM

## 2022-07-22 DIAGNOSIS — D649 Anemia, unspecified: Secondary | ICD-10-CM

## 2022-07-22 LAB — CBC WITH DIFFERENTIAL (CANCER CENTER ONLY)
Abs Immature Granulocytes: 0.03 10*3/uL (ref 0.00–0.07)
Basophils Absolute: 0 10*3/uL (ref 0.0–0.1)
Basophils Relative: 1 %
Eosinophils Absolute: 0.1 10*3/uL (ref 0.0–0.5)
Eosinophils Relative: 2 %
HCT: 27.8 % — ABNORMAL LOW (ref 39.0–52.0)
Hemoglobin: 8.6 g/dL — ABNORMAL LOW (ref 13.0–17.0)
Immature Granulocytes: 1 %
Lymphocytes Relative: 19 %
Lymphs Abs: 1.1 10*3/uL (ref 0.7–4.0)
MCH: 32.6 pg (ref 26.0–34.0)
MCHC: 30.9 g/dL (ref 30.0–36.0)
MCV: 105.3 fL — ABNORMAL HIGH (ref 80.0–100.0)
Monocytes Absolute: 0.4 10*3/uL (ref 0.1–1.0)
Monocytes Relative: 7 %
Neutro Abs: 4 10*3/uL (ref 1.7–7.7)
Neutrophils Relative %: 70 %
Platelet Count: 315 10*3/uL (ref 150–400)
RBC: 2.64 MIL/uL — ABNORMAL LOW (ref 4.22–5.81)
RDW: 21.7 % — ABNORMAL HIGH (ref 11.5–15.5)
WBC Count: 5.7 10*3/uL (ref 4.0–10.5)
nRBC: 0 % (ref 0.0–0.2)

## 2022-07-22 LAB — COMPREHENSIVE METABOLIC PANEL
ALT: 17 IU/L (ref 0–44)
AST: 13 IU/L (ref 0–40)
Albumin: 3.7 g/dL — ABNORMAL LOW (ref 3.8–4.8)
Alkaline Phosphatase: 59 IU/L (ref 44–121)
BUN/Creatinine Ratio: 12 (ref 10–24)
BUN: 11 mg/dL (ref 8–27)
Bilirubin Total: 0.6 mg/dL (ref 0.0–1.2)
CO2: 25 mmol/L (ref 20–29)
Calcium: 8.5 mg/dL — ABNORMAL LOW (ref 8.6–10.2)
Chloride: 101 mmol/L (ref 96–106)
Creatinine, Ser: 0.93 mg/dL (ref 0.76–1.27)
Globulin, Total: 2.3 g/dL (ref 1.5–4.5)
Glucose: 86 mg/dL (ref 70–99)
Potassium: 4.7 mmol/L (ref 3.5–5.2)
Sodium: 138 mmol/L (ref 134–144)
Total Protein: 6 g/dL (ref 6.0–8.5)
eGFR: 86 mL/min/{1.73_m2} (ref >59)

## 2022-07-22 LAB — HEMOGLOBIN AND HEMATOCRIT, BLOOD
HCT: 27.2 % — ABNORMAL LOW (ref 39.0–52.0)
Hemoglobin: 8.5 g/dL — ABNORMAL LOW (ref 13.0–17.0)

## 2022-07-22 MED ORDER — DARBEPOETIN ALFA 300 MCG/0.6ML IJ SOSY
600.0000 ug | PREFILLED_SYRINGE | INTRAMUSCULAR | Status: DC
  Administered 2022-07-22: 600 ug via SUBCUTANEOUS
  Filled 2022-07-22: qty 1.2

## 2022-07-24 ENCOUNTER — Inpatient Hospital Stay: Payer: Medicare Other

## 2022-07-31 ENCOUNTER — Ambulatory Visit (INDEPENDENT_AMBULATORY_CARE_PROVIDER_SITE_OTHER): Payer: Medicare Other | Admitting: Family Medicine

## 2022-07-31 ENCOUNTER — Encounter: Payer: Self-pay | Admitting: Family Medicine

## 2022-07-31 VITALS — BP 120/60 | HR 102 | Ht 70.0 in | Wt 247.0 lb

## 2022-07-31 DIAGNOSIS — I872 Venous insufficiency (chronic) (peripheral): Secondary | ICD-10-CM | POA: Diagnosis not present

## 2022-07-31 DIAGNOSIS — E785 Hyperlipidemia, unspecified: Secondary | ICD-10-CM | POA: Diagnosis not present

## 2022-07-31 NOTE — Progress Notes (Signed)
Date:  07/31/2022   Name:  Brett Boone   DOB:  22-Feb-1945   MRN:  [id number]   Chief Complaint: cellulitis recheck  Patient is a 77 year old male who presents for a recheck cellulitis exam. The patient reports the following problems: gradually resolving. Health maintenance has been reviewed up to date.    Hyperlipidemia This is a chronic (low cholesterol) problem. The problem is controlled. Recent lipid tests were reviewed and are normal. He has no history of chronic renal disease. Pertinent negatives include no chest pain, focal weakness, myalgias or shortness of breath. Current antihyperlipidemic treatment includes statins. The current treatment provides moderate improvement of lipids. There are no compliance problems.     Lab Results  Component Value Date   NA 138 07/21/2022   K 4.7 07/21/2022   CO2 25 07/21/2022   GLUCOSE 86 07/21/2022   BUN 11 07/21/2022   CREATININE 0.93 07/21/2022   CALCIUM 8.5 (L) 07/21/2022   EGFR 86 07/21/2022   GFRNONAA >60 07/19/2022   Lab Results  Component Value Date   CHOL 62 (L) 07/25/2021   HDL 47 07/25/2021   LDLCALC 3 07/25/2021   TRIG 34 07/25/2021   CHOLHDL 1.4 01/04/2018   Lab Results  Component Value Date   TSH 1.275 08/15/2019   Lab Results  Component Value Date   HGBA1C 4.4 (L) 07/17/2022   Lab Results  Component Value Date   WBC 5.7 07/22/2022   HGB 8.5 (L) 07/22/2022   HGB 8.6 (L) 07/22/2022   HCT 27.2 (L) 07/22/2022   HCT 27.8 (L) 07/22/2022   MCV 105.3 (H) 07/22/2022   PLT 315 07/22/2022   Lab Results  Component Value Date   ALT 17 07/21/2022   AST 13 07/21/2022   GGT 35 06/24/2022   ALKPHOS 59 07/21/2022   BILITOT 0.6 07/21/2022   No results found for: "25OHVITD2", "25OHVITD3", "VD25OH"   Review of Systems  Constitutional:  Negative for unexpected weight change.  HENT:  Negative for trouble swallowing.   Eyes:  Negative for visual disturbance.  Respiratory:  Negative for chest tightness,  shortness of breath and wheezing.   Cardiovascular:  Negative for chest pain, palpitations and leg swelling.  Gastrointestinal:  Negative for abdominal distention, blood in stool and constipation.  Endocrine: Negative for polydipsia and polyuria.  Genitourinary:  Negative for difficulty urinating.  Musculoskeletal:  Negative for arthralgias and myalgias.  Neurological:  Negative for focal weakness.    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   Elevated bilirubin 04/01/2020   Kidney lesion, native, left 12/31/2019   Occult blood positive stool    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   Macrocytic anemia 08/15/2019   Weight loss 08/15/2019   Elevated ferritin 08/15/2019   Erectile dysfunction 07/25/2019   Class 2 severe obesity due to excess calories with serious comorbidity and body mass index (BMI) of 36.0 to 36.9 in adult [facility]) 07/19/2019   History of adenomatous polyp of colon    Epigastric  hernia 07/18/2014    Allergies  Allergen Reactions   Amoxicillin-Pot Clavulanate Hives and Nausea And Vomiting    Past Surgical History:  Procedure Laterality Date   CATARACT EXTRACTION W/PHACO Right 03/09/2017   Procedure: CATARACT EXTRACTION PHACO AND INTRAOCULAR LENS PLACEMENT (IOC) RIGHT;  Surgeon: Nevada Crane, MD;  Location: [facility];  Service: Ophthalmology;  Laterality: Right;   CATARACT EXTRACTION W/PHACO Left 08/25/2021   Procedure:  CATARACT EXTRACTION PHACO AND INTRAOCULAR LENS PLACEMENT (IOC) LEFT;  Surgeon: Nevada Crane, MD;  Location: [facility];  Service: Ophthalmology;  Laterality: Left;   CATARACT EXTRACTION W/PHACO Left 10/13/2021   Procedure: CATARACT EXTRACTION PHACO AND INTRAOCULAR LENS PLACEMENT (IOC) LEFT;  Surgeon: Nevada Crane, MD;  Location: [facility];  Service: Ophthalmology;  Laterality: Left;  8.12 0:52.0   COLONOSCOPY  2013   normal- cleared for 5 yrs- Dr Theodoro Parma   COLONOSCOPY WITH PROPOFOL N/A 02/17/2017   Procedure: COLONOSCOPY WITH PROPOFOL;  Surgeon: Toney Reil, MD;  Location: [facility];  Service: Endoscopy;  Laterality: N/A;   COLONOSCOPY WITH PROPOFOL N/A 10/12/2019   Procedure: COLONOSCOPY WITH BIOPSY;  Surgeon: Toney Reil, MD;  Location: [facility];  Service: Endoscopy;  Laterality: N/A;   EPIGASTRIC HERNIA REPAIR N/A 07/06/2014   Procedure: HERNIA REPAIR EPIGASTRIC ADULT;  Surgeon: Duwaine Maxin, MD;  Location: [facility];  Service: General;  Laterality: N/A;   HERNIA REPAIR Bilateral    INSERTION OF MESH N/A 07/06/2014   Procedure: INSERTION OF MESH;  Surgeon: Duwaine Maxin, MD;  Location: [facility];  Service: General;  Laterality: N/A;   IR RADIOLOGIST EVAL & MGMT  11/07/2019   IR RADIOLOGIST EVAL & MGMT  04/25/2020   IR RADIOLOGIST EVAL & MGMT  12/05/2020   IR RADIOLOGIST EVAL & MGMT  06/30/2021   POLYPECTOMY  02/17/2017   Procedure: POLYPECTOMY;  Surgeon: Toney Reil, MD;  Location: [facility];  Service: Endoscopy;;   POLYPECTOMY N/A 10/12/2019   Procedure: POLYPECTOMY;  Surgeon: Toney Reil, MD;  Location: [facility];  Service: Endoscopy;  Laterality: N/A;   SHOULDER ARTHROSCOPY Left 2008   SINUS EXPLORATION      Social History   Tobacco Use   Smoking status: Former    Packs/day: 0.25    Years: 50.00    Additional pack years: 0.00    Total pack years: 12.50    Types: Cigarettes     Quit date: 05/03/2018    Years since quitting: 4.2   Smokeless tobacco: Former   Tobacco comments:    pt quit smoking  Vaping Use   Vaping Use: Never used  Substance Use Topics   Alcohol use: Yes    Comment: Occasional -1x/month   Drug use: No     Medication list has been reviewed and updated.  Current Meds  Medication Sig   acetaminophen (TYLENOL) 325 MG tablet Take 650 mg by mouth every 6 (six) hours as needed.   Calcium Carb-Cholecalciferol 600-400 MG-UNIT TABS Take by mouth.   Darbepoetin Alfa (ARANESP) 25 MCG/0.42ML SOSY injection Inject 25 mcg into the skin.   mupirocin ointment (BACTROBAN) 2 % Apply 1 Application topically 2 (two) times daily.   tadalafil (CIALIS) 5 MG tablet TAKE ONE TABLET BY MOUTH DAILY       07/31/2022    7:54 AM 07/21/2022    1:29 PM 07/15/2022   10:14 AM 12/12/2021   11:17 AM  GAD 7 : Generalized Anxiety Score  Nervous,  Anxious, on Edge 0 0 0 0  Control/stop worrying 0 0 0 0  Worry too much - different things 0 0 0 0  Trouble relaxing 0 0 0 0  Restless 0 0 0 0  Easily annoyed or irritable 0 0 0 0  Afraid - awful might happen 0 0 0 0  Total GAD 7 Score 0 0 0 0  Anxiety Difficulty Not difficult at all Not difficult at all Not difficult at all Not difficult at all       07/31/2022    7:54 AM 07/21/2022    1:29 PM 07/15/2022   10:14 AM  Depression screen PHQ 2/9  Decreased Interest 0 0 0  Down, Depressed, Hopeless 0 0 0  PHQ - 2 Score 0 0 0  Altered sleeping 0 0 0  Tired, decreased energy 0 0 0  Change in appetite 0 0 0  Feeling bad or failure about yourself  0 0 0  Trouble concentrating 0 0 0  Moving slowly or fidgety/restless 0 0 0  Suicidal thoughts 0 0 0  PHQ-9 Score 0 0 0  Difficult doing work/chores Not difficult at all Not difficult at all Not difficult at all    BP Readings from Last 3 Encounters:  07/31/22 120/60  07/22/22 129/63  07/21/22 124/68    Physical Exam Vitals and nursing note reviewed.  Constitutional:       Appearance: Normal appearance.  HENT:     Head: Normocephalic.     Right Ear: Tympanic membrane, ear canal and external ear normal. There is no impacted cerumen.     Left Ear: Tympanic membrane, ear canal and external ear normal. There is no impacted cerumen.  Eyes:     Extraocular Movements: Extraocular movements intact.     Pupils: Pupils are equal, round, and reactive to light.  Cardiovascular:     Pulses: Normal pulses.     Heart sounds: Normal heart sounds. No murmur heard.    No gallop.  Pulmonary:     Effort: Pulmonary effort is normal.     Breath sounds: Normal breath sounds. No wheezing, rhonchi or rales.  Abdominal:     General: Bowel sounds are normal.     Palpations: Abdomen is soft. There is no hepatomegaly or splenomegaly.     Tenderness: There is no abdominal tenderness.  Musculoskeletal:     Cervical back: Normal range of motion and neck supple.  Neurological:     Mental Status: He is alert.     Wt Readings from Last 3 Encounters:  07/31/22 247 lb (112 kg)  07/21/22 261 lb (118.4 kg)  07/16/22 250 lb (113.4 kg)    BP 120/60   Pulse (!) 102   Ht 5\' 10"  (1.778 m)   Wt 247 lb (112 kg)   SpO2 98%   BMI 35.44 kg/m   Assessment and Plan:  1. Dyslipidemia Chronic.  Controlled.  Stable.  Actually cholesterol has been in the low range and recheck once a year.  Patient is asymptomatic.  Continue to encourage low-cholesterol low triglyceride dietary guidelines. - Lipid Panel With LDL/HDL Ratio  2. 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  Controlled.  Stable.  Encouraged compression stockings during the day whenever is convenient.  Cellulitis gradually resolving.  Chronic swelling of the left leg presumably due to to venous insufficiency.   Elizabeth Sauer, MD

## 2022-08-01 LAB — LIPID PANEL WITH LDL/HDL RATIO
Cholesterol, Total: 79 mg/dL — ABNORMAL LOW (ref 100–199)
HDL: 51 mg/dL (ref >39)
LDL Chol Calc (NIH): 16 mg/dL (ref 0–99)
LDL/HDL Ratio: 0.3 ratio (ref 0.0–3.6)
Triglycerides: 44 mg/dL (ref 0–149)
VLDL Cholesterol Cal: 12 mg/dL (ref 5–40)

## 2022-08-04 ENCOUNTER — Inpatient Hospital Stay: Payer: Medicare Other | Attending: Nurse Practitioner

## 2022-08-04 ENCOUNTER — Encounter: Payer: Self-pay | Admitting: Oncology

## 2022-08-04 ENCOUNTER — Inpatient Hospital Stay (HOSPITAL_BASED_OUTPATIENT_CLINIC_OR_DEPARTMENT_OTHER): Payer: Medicare Other | Admitting: Oncology

## 2022-08-04 ENCOUNTER — Inpatient Hospital Stay: Payer: Medicare Other

## 2022-08-04 VITALS — BP 131/77 | HR 77 | Temp 96.5°F | Ht 70.0 in | Wt 248.9 lb

## 2022-08-04 DIAGNOSIS — D649 Anemia, unspecified: Secondary | ICD-10-CM

## 2022-08-04 DIAGNOSIS — Z79899 Other long term (current) drug therapy: Secondary | ICD-10-CM | POA: Diagnosis not present

## 2022-08-04 DIAGNOSIS — D461 Refractory anemia with ring sideroblasts: Secondary | ICD-10-CM | POA: Insufficient documentation

## 2022-08-04 DIAGNOSIS — R7989 Other specified abnormal findings of blood chemistry: Secondary | ICD-10-CM | POA: Diagnosis not present

## 2022-08-04 LAB — CBC WITH DIFFERENTIAL (CANCER CENTER ONLY)
Abs Immature Granulocytes: 0.02 10*3/uL (ref 0.00–0.07)
Basophils Absolute: 0.1 10*3/uL (ref 0.0–0.1)
Basophils Relative: 1 %
Eosinophils Absolute: 0.1 10*3/uL (ref 0.0–0.5)
Eosinophils Relative: 1 %
HCT: 31.3 % — ABNORMAL LOW (ref 39.0–52.0)
Hemoglobin: 9.9 g/dL — ABNORMAL LOW (ref 13.0–17.0)
Immature Granulocytes: 0 %
Lymphocytes Relative: 24 %
Lymphs Abs: 1.2 10*3/uL (ref 0.7–4.0)
MCH: 31.9 pg (ref 26.0–34.0)
MCHC: 31.6 g/dL (ref 30.0–36.0)
MCV: 101 fL — ABNORMAL HIGH (ref 80.0–100.0)
Monocytes Absolute: 0.6 10*3/uL (ref 0.1–1.0)
Monocytes Relative: 11 %
Neutro Abs: 3.1 10*3/uL (ref 1.7–7.7)
Neutrophils Relative %: 63 %
Platelet Count: 322 10*3/uL (ref 150–400)
RBC: 3.1 MIL/uL — ABNORMAL LOW (ref 4.22–5.81)
RDW: 22.5 % — ABNORMAL HIGH (ref 11.5–15.5)
WBC Count: 5 10*3/uL (ref 4.0–10.5)
nRBC: 0.4 % — ABNORMAL HIGH (ref 0.0–0.2)

## 2022-08-04 LAB — CMP (CANCER CENTER ONLY)
ALT: 20 U/L (ref 0–44)
AST: 24 U/L (ref 15–41)
Albumin: 4.1 g/dL (ref 3.5–5.0)
Alkaline Phosphatase: 46 U/L (ref 38–126)
Anion gap: 6 (ref 5–15)
BUN: 30 mg/dL — ABNORMAL HIGH (ref 8–23)
CO2: 25 mmol/L (ref 22–32)
Calcium: 8.8 mg/dL — ABNORMAL LOW (ref 8.9–10.3)
Chloride: 105 mmol/L (ref 98–111)
Creatinine: 0.99 mg/dL (ref 0.61–1.24)
GFR, Estimated: >60 mL/min (ref >60)
Glucose, Bld: 117 mg/dL — ABNORMAL HIGH (ref 70–99)
Potassium: 4.9 mmol/L (ref 3.5–5.1)
Sodium: 136 mmol/L (ref 135–145)
Total Bilirubin: 1.3 mg/dL — ABNORMAL HIGH (ref 0.3–1.2)
Total Protein: 7 g/dL (ref 6.5–8.1)

## 2022-08-04 LAB — IRON AND TIBC
Iron: 221 ug/dL — ABNORMAL HIGH (ref 45–182)
Saturation Ratios: 90 % — ABNORMAL HIGH (ref 17.9–39.5)
TIBC: 246 ug/dL — ABNORMAL LOW (ref 250–450)
UIBC: 25 ug/dL

## 2022-08-04 LAB — FERRITIN: Ferritin: 1143 ng/mL — ABNORMAL HIGH (ref 24–336)

## 2022-08-04 LAB — GAMMA GT: GGT: 36 U/L (ref 7–50)

## 2022-08-04 MED ORDER — DARBEPOETIN ALFA 300 MCG/0.6ML IJ SOSY
300.0000 ug | PREFILLED_SYRINGE | Freq: Once | INTRAMUSCULAR | Status: AC
  Administered 2022-08-04: 300 ug via SUBCUTANEOUS

## 2022-08-04 MED ORDER — DARBEPOETIN ALFA 500 MCG/ML IJ SOSY: 600.0000 ug | PREFILLED_SYRINGE | INTRAMUSCULAR | Status: DC

## 2022-08-04 NOTE — Progress Notes (Signed)
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 Bone marrow biopsy on 09/11/2019 revealed macrocytic anemia and hypercellular bone marrow for age with dyspoietic changes primarily involving the erythroid cell lines associated with abundant ring sideroblasts (> 15%).  There was no increase in blasts. Flow cytometry was negative. Cytogenetics revealed 45,X,-Y [20].  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 IPSS score is low.   Baseline EPO level was 63.3 in August 2021.  Patient received Aranesp for a month in September 2021 and was then switched to Retacrit.  He was again switched to aranesp   As per insurance preference patient found to have elevated ferritin and when we was under the care of Dr. 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   MRI abdomen in September 2021 showed 1.3 cm lesion in the left kidney concerning for renal cell carcinoma for which he follows up with urology    Interval history-patient was admitted to the ER requiring ICU admission for sepsis from what started off with a toe infection when he stepped on a [place].  At that time his hemoglobin dropped less than 7 requiring blood transfusions.  Today he is feeling better overall.  ECOG PS- 1 Pain scale- 0  Review of systems- Review of Systems  Constitutional:  Positive for malaise/fatigue.  Negative for chills, fever and weight loss.  HENT:  Negative for congestion, ear discharge and nosebleeds.   Eyes:  Negative for blurred vision.  Respiratory:  Negative for cough, hemoptysis, sputum production, shortness of breath and wheezing.   Cardiovascular:  Negative for chest pain, palpitations, orthopnea and claudication.  Gastrointestinal:  Negative for abdominal pain, blood in stool, constipation, diarrhea, heartburn, melena, nausea and vomiting.  Genitourinary:  Negative for dysuria, flank pain, frequency, hematuria and urgency.  Musculoskeletal:  Negative for back pain, joint pain and myalgias.  Skin:  Negative for rash.  Neurological:  Negative for dizziness, tingling, focal weakness, seizures, weakness and headaches.  Endo/Heme/Allergies:  Does not bruise/bleed easily.  Psychiatric/Behavioral:  Negative for depression and suicidal ideas. The patient does not have insomnia.       Allergies  Allergen Reactions   Amoxicillin-Pot Clavulanate Hives and Nausea And Vomiting     Past Medical History:  Diagnosis Date   Anemia    Heart murmur    Hydrocele    MDS (myelodysplastic syndrome) (HCC)    Rotator cuff tear arthropathy of right shoulder 08/01/2020   Venous stasis dermatitis of both lower extremities      Past Surgical History:  Procedure Laterality Date   CATARACT EXTRACTION W/PHACO Right 03/09/2017   Procedure: CATARACT EXTRACTION PHACO AND INTRAOCULAR LENS PLACEMENT (IOC) RIGHT;  Surgeon: Nevada Crane, MD;  Location: [facility];  Service: Ophthalmology;  Laterality: Right;   CATARACT EXTRACTION W/PHACO Left 08/25/2021   Procedure: CATARACT EXTRACTION PHACO AND INTRAOCULAR LENS PLACEMENT (IOC) LEFT;  Surgeon: Nevada Crane, MD;  Location: [facility];  Service: Ophthalmology;  Laterality: Left;   CATARACT EXTRACTION W/PHACO Left 10/13/2021   Procedure: CATARACT EXTRACTION PHACO AND INTRAOCULAR LENS PLACEMENT (IOC) LEFT;  Surgeon: Nevada Crane, MD;  Location: [facility];  Service: Ophthalmology;  Laterality: Left;  8.12 0:52.0   COLONOSCOPY  2013   normal- cleared for 5 yrs- Dr Theodoro Parma   COLONOSCOPY WITH PROPOFOL N/A 02/17/2017   Procedure: COLONOSCOPY WITH PROPOFOL;  Surgeon: Toney Reil, MD;  Location: [facility];  Service: Endoscopy;  Laterality: N/A;   COLONOSCOPY WITH PROPOFOL N/A 10/12/2019   Procedure: COLONOSCOPY WITH BIOPSY;  Surgeon: Toney Reil, MD;  Location: [facility];  Service: Endoscopy;  Laterality: N/A;   EPIGASTRIC HERNIA REPAIR N/A 07/06/2014   Procedure: HERNIA REPAIR EPIGASTRIC ADULT;  Surgeon: Duwaine Maxin, MD;  Location: [facility] OR[facility];  Service: General;  Laterality: N/A;   HERNIA REPAIR Bilateral    INSERTION OF MESH N/A 07/06/2014   Procedure: INSERTION OF MESH;  Surgeon: Duwaine Maxin, MD;  Location: [facility] OR[facility];  Service: General;  Laterality: N/A;   IR RADIOLOGIST EVAL & MGMT  11/07/2019   IR RADIOLOGIST EVAL & MGMT  04/25/2020   IR RADIOLOGIST EVAL & MGMT  12/05/2020   IR RADIOLOGIST EVAL & MGMT  06/30/2021   POLYPECTOMY  02/17/2017   Procedure: POLYPECTOMY;  Surgeon: Toney Reil, MD;  Location: [facility];  Service: Endoscopy;;   POLYPECTOMY N/A 10/12/2019   Procedure: POLYPECTOMY;  Surgeon: Toney Reil, MD;  Location: [facility];  Service: Endoscopy;  Laterality: N/A;   SHOULDER ARTHROSCOPY Left 2008   SINUS EXPLORATION      Social History   Socioeconomic History   Marital status: Married    Spouse name: Not on file   Number of children: 3   Years of education: Not on file   Highest education level: Bachelor's degree (e.g., BA, AB, BS)  Occupational History   Occupation: Retired  Tobacco Use   Smoking status: Former    Packs/day: 0.25    Years: 50.00    Additional pack years: 0.00    Total pack years: 12.50    Types: Cigarettes    Quit date: 05/03/2018    Years since quitting: 4.2   Smokeless tobacco:  Former   Tobacco comments:    pt quit smoking  Vaping Use   Vaping Use: Never used  Substance and Sexual Activity   Alcohol use: Yes    Comment: Occasional -1x/month   Drug use: No   Sexual activity: Yes  Other Topics Concern   Not on file  Social History Narrative   Not on file   Social Determinants of Health   Financial Resource Strain: Low Risk  (03/12/2022)   Overall Financial Resource Strain (CARDIA)    Difficulty of Paying Living Expenses: Not hard at all  Food Insecurity: No Food Insecurity (07/20/2022)   Hunger Vital Sign    Worried About Running Out of Food in the Last Year: Never true    Ran Out of Food in the Last Year: Never true  Transportation Needs: No Transportation Needs (07/20/2022)   PRAPARE - Administrator, Civil Service (Medical): No    Lack of Transportation (Non-Medical): No  Physical Activity: Sufficiently Active (03/12/2022)   Exercise Vital Sign    Days of Exercise per Week: 4 days    Minutes of Exercise per Session: 40 min  Stress: No Stress Concern Present (03/12/2022)   [employer] of Occupational Health -  Occupational Stress Questionnaire    Feeling of Stress : Not at all  Social Connections: Moderately Integrated (03/12/2022)   Social Connection and Isolation Panel [NHANES]    Frequency of Communication with Friends and Family: More than three times a week    Frequency of Social Gatherings with Friends and Family: More than three times a week    Attends Religious Services: More than 4 times per year    Active Member of [employer] or Organizations: No    Attends Banker Meetings: Never    Marital Status: Married  Catering manager Violence: Not At Risk (07/16/2022)   Humiliation, Afraid, Rape, and Kick questionnaire    Fear of Current or Ex-Partner: No    Emotionally Abused: No    Physically Abused: No    Sexually Abused: No    Family History  Problem Relation Age of Onset   Diabetes Father    Heart disease Father  62   Stroke Mother      Current Outpatient Medications:    acetaminophen (TYLENOL) 325 MG tablet, Take 650 mg by mouth every 6 (six) hours as needed., Disp: , Rfl:    Calcium Carb-Cholecalciferol 600-400 MG-UNIT TABS, Take by mouth., Disp: , Rfl:    Darbepoetin Alfa (ARANESP) 25 MCG/0.42ML SOSY injection, Inject 25 mcg into the skin., Disp: , Rfl:    mupirocin ointment (BACTROBAN) 2 %, Apply 1 Application topically 2 (two) times daily., Disp: 22 g, Rfl: 0   tadalafil (CIALIS) 5 MG tablet, TAKE ONE TABLET BY MOUTH DAILY, Disp: 30 tablet, Rfl: 11   meloxicam (MOBIC) 15 MG tablet, Take 1 tablet (15 mg total) by mouth daily. (Patient not taking: Reported on 07/21/2022), Disp: 30 tablet, Rfl: 0 No current facility-administered medications for this visit.  Facility-Administered Medications Ordered in Other Visits:    Darbepoetin Alfa (ARANESP) injection 300 mcg, 300 mcg, Subcutaneous, Weekly, Creig Hines, MD, 300 mcg at 12/26/21 1013   Darbepoetin Alfa (ARANESP) injection 300 mcg, 300 mcg, Subcutaneous, Weekly, Creig Hines, MD, 300 mcg at 02/13/22 1021   Darbepoetin Alfa (ARANESP) injection 600 mcg, 600 mcg, Subcutaneous, Q14 Days, Creig Hines, MD, 600 mcg at 03/13/22 1032   Darbepoetin Alfa (ARANESP) injection 600 mcg, 600 mcg, Subcutaneous, Q14 Days, Creig Hines, MD, 600 mcg at 06/12/22 1052  Physical exam:  Vitals:   08/04/22 1108  BP: 131/77  Pulse: 77  Temp: (!) 96.5 F (35.8 C)  TempSrc: Tympanic  SpO2: 100%  Weight: 248 lb 14.4 oz (112.9 kg)  Height: 5\' 10"  (1.778 m)   Physical Exam Cardiovascular:     Rate and Rhythm: Normal rate and regular rhythm.     Heart sounds: Normal heart sounds.  Pulmonary:     Effort: Pulmonary effort is normal.     Breath sounds: Normal breath sounds.  Abdominal:     General: Bowel sounds are normal.     Palpations: Abdomen is soft.  Skin:    General: Skin is warm and dry.  Neurological:     Mental Status: He is alert and oriented  to person, place, and time.         Latest Ref Rng & Units 08/04/2022   10:57 AM  CMP  Glucose 70 - 99 mg/dL 409   BUN 8 - 23 mg/dL 30   Creatinine 8.11 - 1.24 mg/dL 9.14   Sodium 782 - 956 mmol/L 136   Potassium 3.5 - 5.1 mmol/L 4.9   Chloride 98 - 111 mmol/L  105   CO2 22 - 32 mmol/L 25   Calcium 8.9 - 10.3 mg/dL 8.8   Total Protein 6.5 - 8.1 g/dL 7.0   Total Bilirubin 0.3 - 1.2 mg/dL 1.3   Alkaline Phos 38 - 126 U/L 46   AST 15 - 41 U/L 24   ALT 0 - 44 U/L 20       Latest Ref Rng & Units 08/04/2022   10:58 AM  CBC  WBC 4.0 - 10.5 K/uL 5.0   Hemoglobin 13.0 - 17.0 g/dL 9.9   Hematocrit 16.1 - 52.0 % 31.3   Platelets 150 - 400 K/uL 322     No images are attached to the encounter.  ECHOCARDIOGRAM COMPLETE  Result Date: 07/18/2022    ECHOCARDIOGRAM REPORT   Patient Name:   [name] Date of Exam: 07/18/2022 Medical Rec #:  [id number]             Height:       70.0 in Accession #:    [id number]            Weight:       250.0 lb Date of Birth:  04-04-1945             BSA:          2.295 m Patient Age:    76 years              BP:           116/64 mmHg Patient Gender: M                     HR:           79 bpm. Exam Location:  [facility] Procedure: 2D Echo Indications:     Fever R50.9                  [place]ur R01.1  History:         Patient has no prior history of Echocardiogram examinations.  Sonographer:     Overton Mam RDCS, FASE Referring Phys:  [id number] Verdene Lennert Diagnosing Phys: Alwyn Pea MD IMPRESSIONS  1. Left ventricular ejection fraction, by estimation, is 55 to 60%. The left ventricle has normal function. The left ventricle has no regional wall motion abnormalities. The left ventricular internal cavity size was mildly dilated. There is mild left ventricular hypertrophy. Left ventricular diastolic parameters were normal.  2. Right ventricular systolic function is normal. The right ventricular size is normal.  3. The mitral valve is normal in structure. Trivial  mitral valve regurgitation.  4. The aortic valve is normal in structure. Aortic valve regurgitation is not visualized. FINDINGS  Left Ventricle: Left ventricular ejection fraction, by estimation, is 55 to 60%. The left ventricle has normal function. The left ventricle has no regional wall motion abnormalities. The left ventricular internal cavity size was mildly dilated. There is  mild left ventricular hypertrophy. Left ventricular diastolic parameters were normal. Right Ventricle: The right ventricular size is normal. No increase in right ventricular wall thickness. Right ventricular systolic function is normal. Left Atrium: Left atrial size was normal in size. Right Atrium: Right atrial size was normal in size. Pericardium: There is no evidence of pericardial effusion. Mitral Valve: The mitral valve is normal in structure. Trivial mitral valve regurgitation. Tricuspid Valve: The tricuspid valve is normal in structure. Tricuspid valve regurgitation is mild. Aortic Valve: The aortic valve is normal in structure. Aortic valve regurgitation is not  visualized. Aortic valve mean gradient measures 8.0 mmHg. Aortic valve peak gradient measures 14.8 mmHg. Pulmonic Valve: The pulmonic valve was normal in structure. Pulmonic valve regurgitation is not visualized. Aorta: The ascending aorta was not well visualized. IAS/Shunts: No atrial level shunt detected by color flow Doppler.  LEFT VENTRICLE PLAX 2D LVIDd:         5.20 cm Diastology LVIDs:         3.70 cm LV e' medial:    9.46 cm/s LV PW:         1.20 cm LV E/e' medial:  12.6 LV IVS:        1.30 cm LV e' lateral:   11.70 cm/s                        LV E/e' lateral: 10.2  RIGHT VENTRICLE RV Basal diam:  3.60 cm RV S prime:     14.80 cm/s TAPSE (M-mode): 2.4 cm LEFT ATRIUM             Index        RIGHT ATRIUM           Index LA diam:        4.10 cm 1.79 cm/m   RA Area:     16.80 cm LA Vol (A2C):   62.2 ml 27.10 ml/m  RA Volume:   46.00 ml  20.04 ml/m LA Vol (A4C):    37.1 ml 16.17 ml/m LA Biplane Vol: 52.0 ml 22.66 ml/m  AORTIC VALVE                    PULMONIC VALVE AV Vmax:           192.50 cm/s  PV Vmax:        1.32 m/s AV Vmean:          128.000 cm/s PV Peak grad:   7.0 mmHg AV VTI:            0.407 m      RVOT Peak grad: 3 mmHg AV Peak Grad:      14.8 mmHg AV Mean Grad:      8.0 mmHg LVOT Vmax:         139.00 cm/s LVOT Vmean:        88.200 cm/s LVOT VTI:          0.277 m LVOT/AV VTI ratio: 0.68 MITRAL VALVE                TRICUSPID VALVE MV Area (PHT): 2.50 cm     TR Peak grad:   27.9 mmHg MV Decel Time: 304 msec     TR Vmax:        264.00 cm/s MV E velocity: 119.00 cm/s MV A velocity: 111.00 cm/s  SHUNTS MV E/A ratio:  1.07         Systemic VTI: 0.28 m Alwyn Pea MD Electronically signed by Alwyn Pea MD Signature Date/Time: 07/18/2022/3:56:53 PM    Final    CT TIBIA FIBULA LEFT W CONTRAST  Result Date: 07/16/2022 CLINICAL DATA:  Left lower leg redness and swelling. EXAM: CT OF THE LOWER LEFT EXTREMITY WITH CONTRAST TECHNIQUE: Multidetector CT imaging of the lower left extremity was performed according to the standard protocol following intravenous contrast administration. RADIATION DOSE REDUCTION: This exam was performed according to the departmental dose-optimization program which includes automated exposure control, adjustment of the mA and/or kV according to patient size and/or  use of iterative reconstruction technique. CONTRAST:  OMNIPAQUE IOHEXOL 300 MG/ML  SOLN COMPARISON:  Left tibia and fibula x-rays from same day. FINDINGS: Bones/Joint/Cartilage No fracture or dislocation. Degenerative changes of the left knee and ankle. No joint effusion. Ligaments Ligaments are suboptimally evaluated by CT. Muscles and Tendons Grossly intact. Soft tissue Circumferential skin thickening and soft tissue swelling of the entire lower leg. No fluid collection or subcutaneous emphysema. No soft tissue mass. IMPRESSION: 1. Circumferential skin thickening and  soft tissue swelling of the entire lower leg, consistent with cellulitis. No abscess or subcutaneous emphysema. Electronically Signed   By: Obie Dredge M.D.   On: 07/16/2022 15:33   DG Chest Port 1 View  Result Date: 07/16/2022 CLINICAL DATA:  Questionable sepsis - evaluate for abnormality EXAM: PORTABLE CHEST 1 VIEW COMPARISON:  CXR 12/31/16 FINDINGS: No pleural effusion. No pneumothorax. Borderline cardiomegaly. No focal airspace opacity. There are prominent bilateral interstitial opacities could represent pulmonary venous congestion. No radiographically apparent displaced rib fractures. Visualized upper abdomen is unremarkable. IMPRESSION: Borderline cardiomegaly with prominent bilateral interstitial opacities, which could represent pulmonary venous congestion. Electronically Signed   By: Lorenza Cambridge M.D.   On: 07/16/2022 14:19   DG Tibia/Fibula Left  Result Date: 07/16/2022 CLINICAL DATA:  infection EXAM: LEFT TIBIA AND FIBULA - 2 VIEW COMPARISON:  None Available. FINDINGS: No cortical erosion or destruction. There is no evidence of fracture or other focal bone lesions. Moderate tricompartmental degenerative changes of the knee. Subcutaneus soft tissue edema. Dermal/vascular calcifications. IMPRESSION: 1. No acute displaced fracture or dislocation. 2. No radiographic findings to suggest osteomyelitis. Limited evaluation due to overlapping osseous structures and overlying soft tissues. If high clinical concern, please consider MRI for further evaluation (with intravenous contrast if GFR greater than 30). Electronically Signed   By: Tish Frederickson M.D.   On: 07/16/2022 14:13   US Venous Img Lower Unilateral Left (DVT)  Result Date: 07/15/2022 CLINICAL DATA:  Left lower extremity pain and edema. Evaluate for DVT. EXAM: LEFT LOWER EXTREMITY VENOUS DOPPLER ULTRASOUND TECHNIQUE: Gray-scale sonography with graded compression, as well as color Doppler and duplex ultrasound were performed to evaluate  the lower extremity deep venous systems from the level of the common femoral vein and including the common femoral, femoral, profunda femoral, popliteal and calf veins including the posterior tibial, peroneal and gastrocnemius veins when visible. The superficial great saphenous vein was also interrogated. Spectral Doppler was utilized to evaluate flow at rest and with distal augmentation maneuvers in the common femoral, femoral and popliteal veins. COMPARISON:  None Available. FINDINGS: Contralateral Common Femoral Vein: Respiratory phasicity is normal and symmetric with the symptomatic side. No evidence of thrombus. Normal compressibility. Common Femoral Vein: No evidence of thrombus. Normal compressibility, respiratory phasicity and response to augmentation. Saphenofemoral Junction: No evidence of thrombus. Normal compressibility and flow on color Doppler imaging. Profunda Femoral Vein: No evidence of thrombus. Normal compressibility and flow on color Doppler imaging. Femoral Vein: No evidence of thrombus. Normal compressibility, respiratory phasicity and response to augmentation. Popliteal Vein: No evidence of thrombus. Normal compressibility, respiratory phasicity and response to augmentation. Calf Veins: No evidence of thrombus. Normal compressibility and flow on color Doppler imaging. Superficial Great Saphenous Vein: No evidence of thrombus. Normal compressibility. Other Findings: Note is made of a prominent though non pathologically enlarged left inguinal lymph node. The lymph node is not enlarged by size criteria measuring 1.2 cm in greatest short axis diameter and maintains a benign fatty hilum without cortical thickening (image 34),  presumably reactive in etiology. Note is made of a mildly prominent though widely patent superficial varicosity at the level of the left lower leg and calf (images 39 through 42), without evidence superficial thrombophlebitis. IMPRESSION: 1. No evidence of DVT within the left  lower extremity. 2. Mildly prominent though widely patent superficial varicosity at the level of the left lower leg calf. No evidence of superficial thrombophlebitis. Electronically Signed   By: Simonne Come M.D.   On: 07/15/2022 14:00     Assessment and plan- Patient is a 77 y.o. male with low risk MDS and SF 3 B1 mutation here for routine follow-up  With regards to patient's anemia patient has been on Aranesp every 2 weeks and his hemoglobin has been stable between 9-10 but recently has been more closer to 9 than 10.  When he was admitted to the hospital for cellulitis his hemoglobin drifted down to less than 7 requiring blood transfusions.  Today his hemoglobin is again back up to 9.9.  Goal with MDS is to allow him to have a good quality of life as long as possible and without having to change our interventions if they are working.  Since his hemoglobin has remained more than 9 or higher with aranesp I would like to continue that instead of switching him to Luspatercept.   Elevated ferritin: Patient has heterozygosity for H63D but that should not lead to iron overload.  Recent MRI abdomen showed iron deposition in his liver which was stableCompared to his MRI 6 months ago.  His ferritin today is elevated at 1143 but that could also be an acute phase reactant in the setting of his recent hospitalization.  Given his anemia he cannot undergo any phlebotomy.  LFTs remain normal.  I am continuing to observe his ferritin levels and if there is continued increase in his ferritin levels over the next 2 to 3 months we will need to consider introducing iron chelation therapy.  Patient states that he does not drink alcohol frequently he may have a beer or 2 when he goes to watch games but does not drink on a daily basis.  Add an aspirin H&H every 2 weeks and I will see him back in 3 months with CBC ferritin and iron studies   Visit Diagnosis 1. Refractory anemia with ring sideroblasts (HCC)   2. Erythropoietin  (EPO) stimulating agent anemia management patient   3. Elevated ferritin      Dr. Owens Shark, MD, MPH [facility] [phone] 08/04/2022 3:46 PM

## 2022-08-04 NOTE — Progress Notes (Signed)
Concerns that cellulitis has affected MDS.

## 2022-08-07 ENCOUNTER — Ambulatory Visit: Payer: Medicare Other | Admitting: Oncology

## 2022-08-07 ENCOUNTER — Inpatient Hospital Stay: Payer: Medicare Other

## 2022-08-07 ENCOUNTER — Other Ambulatory Visit: Payer: Medicare Other

## 2022-08-11 ENCOUNTER — Ambulatory Visit: Payer: Medicare Other | Admitting: Urology

## 2022-08-13 ENCOUNTER — Other Ambulatory Visit: Payer: Self-pay | Admitting: Oncology

## 2022-08-19 ENCOUNTER — Inpatient Hospital Stay: Payer: Medicare Other

## 2022-08-19 ENCOUNTER — Other Ambulatory Visit: Payer: Self-pay | Admitting: *Deleted

## 2022-08-19 ENCOUNTER — Ambulatory Visit: Payer: Medicare Other | Admitting: Urology

## 2022-08-19 VITALS — BP 119/74 | HR 70

## 2022-08-19 DIAGNOSIS — D649 Anemia, unspecified: Secondary | ICD-10-CM

## 2022-08-19 DIAGNOSIS — D461 Refractory anemia with ring sideroblasts: Secondary | ICD-10-CM | POA: Diagnosis not present

## 2022-08-19 DIAGNOSIS — R7989 Other specified abnormal findings of blood chemistry: Secondary | ICD-10-CM | POA: Diagnosis not present

## 2022-08-19 LAB — HEMOGLOBIN AND HEMATOCRIT (CANCER CENTER ONLY)
HCT: 27.2 % — ABNORMAL LOW (ref 39.0–52.0)
Hemoglobin: 8.5 g/dL — ABNORMAL LOW (ref 13.0–17.0)

## 2022-08-19 MED ORDER — DARBEPOETIN ALFA 300 MCG/0.6ML IJ SOSY
600.0000 ug | PREFILLED_SYRINGE | INTRAMUSCULAR | Status: DC
  Administered 2022-08-19: 600 ug via SUBCUTANEOUS
  Filled 2022-08-19: qty 1.2

## 2022-08-21 ENCOUNTER — Inpatient Hospital Stay: Payer: Medicare Other

## 2022-09-01 ENCOUNTER — Other Ambulatory Visit
Admission: RE | Admit: 2022-09-01 | Discharge: 2022-09-01 | Disposition: A | Discharge status: Home / Self Care | Payer: Medicare Other | Attending: Urology | Admitting: Urology

## 2022-09-01 ENCOUNTER — Encounter: Payer: Self-pay | Admitting: Urology

## 2022-09-01 ENCOUNTER — Ambulatory Visit: Payer: Medicare Other | Admitting: Urology

## 2022-09-01 VITALS — BP 132/71 | HR 71 | Ht 70.0 in | Wt 252.4 lb

## 2022-09-01 DIAGNOSIS — N2889 Other specified disorders of kidney and ureter: Secondary | ICD-10-CM | POA: Diagnosis not present

## 2022-09-01 DIAGNOSIS — R972 Elevated prostate specific antigen [PSA]: Secondary | ICD-10-CM | POA: Insufficient documentation

## 2022-09-01 DIAGNOSIS — N529 Male erectile dysfunction, unspecified: Secondary | ICD-10-CM

## 2022-09-01 MED ORDER — TADALAFIL 5 MG PO TABS: 5.0000 mg | ORAL_TABLET | Freq: Every day | ORAL | 6 refills | Status: DC | PRN

## 2022-09-01 NOTE — Patient Instructions (Signed)
Nocturia refers to the need to wake up during the night to urinate, which can disrupt your sleep and impact your overall well-being. Fortunately, there are several strategies you can employ to help prevent or manage nocturia. It's important to consult with your healthcare provider before making any significant changes to your routine. Here are some helpful strategies to consider:  Limit Fluid Intake Before Bed: Avoid drinking large amounts of fluids in the evening, especially within a few hours of bedtime. Consume most of your daily fluid intake earlier in the day to reduce the need to urinate at night.  Monitor Your Diet: Limit your intake of caffeine and alcohol, as these substances can increase urine production and irritate the bladder.  Avoid diet, "zero calorie," and artificially sweetened drinks, especially sodas, in the afternoon or evening. Be mindful of consuming foods and drinks with high water content before bedtime, such as watermelon and herbal teas.  Time Your Medications: If you're taking medications that contribute to increased urination, consult your healthcare provider about adjusting the timing of these medications to minimize their impact during the night.  Practice Double Voiding: Before going to bed, make an effort to empty your bladder twice within a short period. This can help reduce the amount of urine left in your bladder before sleep.  Bladder Training: Gradually increase the time between bathroom visits during the day to train your bladder to hold larger volumes of urine. Over time, this can help reduce the frequency of nighttime awakenings to urinate.  Elevate Your Legs During the Day: Elevating your legs during the day can help minimize fluid retention in your lower extremities, which might reduce nighttime urination.  Pelvic Floor Exercises: Strengthening your pelvic floor muscles through Kegel exercises can help improve bladder control and potentially reduce  the urge to urinate at night.  Create a Relaxing Bedtime Routine: Stress and anxiety can exacerbate nocturia. Engage in calming activities before bed, such as reading, listening to soothing music, or practicing relaxation techniques.  Stay Active: Engage in regular physical activity, but avoid intense exercise close to bedtime, as this can increase your body's demand for fluids.  Maintain a Healthy Weight: Excess weight can compress the bladder and contribute to bladder and urinary issues. Aim to achieve and maintain a healthy weight through a balanced diet and regular exercise.  Remember that every individual is unique, and the effectiveness of these strategies may vary. It's important to work with your healthcare provider to develop a plan that suits your specific needs and addresses any underlying causes of nocturia.    Prostate Cancer Screening  Prostate cancer screening is testing that is done to check for the presence of prostate cancer in men. The prostate gland is a walnut-sized gland that is located below the bladder and in front of the rectum in males. The function of the prostate is to add fluid to semen during ejaculation. Prostate cancer is one of the most common types of cancer in men. Who should have prostate cancer screening? Screening recommendations vary based on age and other risk factors, as well as between the professional organizations who make the recommendations. In general, screening is recommended if: You are age 14 to 25 and have an average risk for prostate cancer. You should talk with your health care provider about your need for screening and how often screening should be done. Because most prostate cancers are slow growing and will not cause death, screening in this age group is generally reserved for men who  have a 10- to 15-year life expectancy. You are younger than age 51, and you have these risk factors: Having a father, brother, or uncle who has been  diagnosed with prostate cancer. The risk is higher if your family member's cancer occurred at an early age or if you have multiple family members with prostate cancer at an early age. Being a male who is [place] or is of [place] or sub-Saharan African descent. In general, screening is not recommended if: You are younger than age 71. You are between the ages of 26 and 67 and you have no risk factors. You are 64 years of age or older. At this age, the risks that screening can cause are greater than the benefits that it may provide. If you are at high risk for prostate cancer, your health care provider may recommend that you have screenings more often or that you start screening at a younger age. How is screening for prostate cancer done? The recommended prostate cancer screening test is a blood test called the prostate-specific antigen (PSA) test. PSA is a protein that is made in the prostate. As you age, your prostate naturally produces more PSA. Abnormally high PSA levels may be caused by: Prostate cancer. An enlarged prostate that is not caused by cancer (benign prostatic hyperplasia, or BPH). This condition is very common in older men. A prostate gland infection (prostatitis) or urinary tract infection. Certain medicines such as male hormones (like testosterone) or other medicines that raise testosterone levels. A rectal exam may be done as part of prostate cancer screening to help provide information about the size of your prostate gland. When a rectal exam is performed, it should be done after the PSA level is drawn to avoid any effect on the results. Depending on the PSA results, you may need more tests, such as: A physical exam to check the size of your prostate gland, if not done as part of screening. Blood and imaging tests. A procedure to remove tissue samples from your prostate gland for testing (biopsy). This is the only way to know for certain if you have prostate cancer. What are the  benefits of prostate cancer screening? Screening can help to identify cancer at an early stage, before symptoms start and when the cancer can be treated more easily. There is a small chance that screening may lower your risk of dying from prostate cancer. The chance is small because prostate cancer is a slow-growing cancer, and most men with prostate cancer die from a different cause. What are the risks of prostate cancer screening? The main risk of prostate cancer screening is diagnosing and treating prostate cancer that would never have caused any symptoms or problems. This is called overdiagnosisand overtreatment. PSA screening cannot tell you if your PSA is high due to cancer or a different cause. A prostate biopsy is the only procedure to diagnose prostate cancer. Even the results of a biopsy may not tell you if your cancer needs to be treated. Slow-growing prostate cancer may not need any treatment other than monitoring, so diagnosing and treating it may cause unnecessary stress or other side effects. Questions to ask your health care provider When should I start prostate cancer screening? What is my risk for prostate cancer? How often do I need screening? What type of screening tests do I need? How do I get my test results? What do my results mean? Do I need treatment? Where to find more information The American Cancer Society: www.cancer.org American Urological  Association: www.auanet.org Contact a health care provider if: You have difficulty urinating. You have pain when you urinate or ejaculate. You have blood in your urine or semen. You have pain in your back or in the area of your prostate. Summary Prostate cancer is a common type of cancer in men. The prostate gland is located below the bladder and in front of the rectum. This gland adds fluid to semen during ejaculation. Prostate cancer screening may identify cancer at an early stage, when the cancer can be treated more easily  and is less likely to have spread to other areas of the body. The prostate-specific antigen (PSA) test is the recommended screening test for prostate cancer, but it has associated risks. Discuss the risks and benefits of prostate cancer screening with your health care provider. If you are age 40 or older, the risks that screening can cause are greater than the benefits that it may provide. This information is not intended to replace advice given to you by your health care provider. Make sure you discuss any questions you have with your health care provider. Document Revised: 07/08/2020 Document Reviewed: 07/08/2020 Elsevier Patient Education  2024 [employer].

## 2022-09-01 NOTE — Progress Notes (Signed)
   09/01/2022 10:34 AM   Leanora Cover III February 23, 1945 [id number]  Reason for visit: Follow up left small renal mass, right hydrocele, ED, urinary frequency/nocturia, elevated PSA  HPI: 77 year old male I have been following for the above issues.  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  He was seen by Dr. Fredia Sorrow in interventional radiology, and he felt this would be very challenging to ablate percutaneously and recommended active surveillance as well.  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  We again reviewed options including attempted percutaneous ablation, partial nephrectomy, or active surveillance.  We reviewed the AUA guidelines regarding renal masses, and I recommended continuing active surveillance with his stable lesion, with repeat MRI in 1 year.   Regarding his erectile dysfunction, he has increased the dose up to 10 to 15 mg on demand which works better.  We discussed the max dose of 20 mg/day, refill provided.  He denies any changes in the right hydrocele, and we again reviewed options including observation or hydrocelectomy, and he would like to continue observation at this time.  He had a PSA by PCP in June 2023 that was mildly increased at 4.7 from 3.3 in 2018.  Reviewed reviewed the AUA guidelines that do not recommend routine screening in men over age 70, and we had opted to repeat a PSA reflex to free in 6 months, it looks like this was never completed.  Using shared decision making, he opted for repeat PSA with reflex to free today.  Last year he reported increase in nocturia from a baseline of 2 times per night since teenage years up to 3 times per night.  Urinalysis and culture were benign.  We discussed options including behavioral  strategies of minimizing fluids and bladder irritants prior to bedtime , weight loss, or trial of Flomax.  He would like to avoid adding another medication at this time.  -Cialis increased to 5 to 20 mg on demand for ED -Behavioral strategies discussed regarding urinary symptoms and nocturia, declined Flomax today -PSA reflex to free today, will call with results -RTC 1 year MRI for surveillance of left small renal mass  Sondra Come, MD  [facility] [address], [address] [place], [place] [address] [phone]

## 2022-09-02 ENCOUNTER — Other Ambulatory Visit: Payer: Self-pay

## 2022-09-02 ENCOUNTER — Inpatient Hospital Stay: Payer: Medicare Other

## 2022-09-02 ENCOUNTER — Inpatient Hospital Stay: Payer: Medicare Other | Attending: Nurse Practitioner

## 2022-09-02 VITALS — BP 138/78

## 2022-09-02 DIAGNOSIS — D461 Refractory anemia with ring sideroblasts: Secondary | ICD-10-CM

## 2022-09-02 DIAGNOSIS — D649 Anemia, unspecified: Secondary | ICD-10-CM

## 2022-09-02 LAB — HEMOGLOBIN AND HEMATOCRIT (CANCER CENTER ONLY)
HCT: 28.3 % — ABNORMAL LOW (ref 39.0–52.0)
Hemoglobin: 8.8 g/dL — ABNORMAL LOW (ref 13.0–17.0)

## 2022-09-02 MED ORDER — DARBEPOETIN ALFA 300 MCG/0.6ML IJ SOSY
600.0000 ug | PREFILLED_SYRINGE | Freq: Once | INTRAMUSCULAR | Status: AC
  Administered 2022-09-02: 600 ug via SUBCUTANEOUS
  Filled 2022-09-02: qty 1.2

## 2022-09-16 ENCOUNTER — Inpatient Hospital Stay: Payer: Medicare Other

## 2022-09-16 VITALS — BP 115/73

## 2022-09-16 DIAGNOSIS — R000 Tachycardia, unspecified: Secondary | ICD-10-CM | POA: Diagnosis not present

## 2022-09-16 DIAGNOSIS — D461 Refractory anemia with ring sideroblasts: Secondary | ICD-10-CM

## 2022-09-16 DIAGNOSIS — R002 Palpitations: Secondary | ICD-10-CM | POA: Diagnosis not present

## 2022-09-16 DIAGNOSIS — D539 Nutritional anemia, unspecified: Secondary | ICD-10-CM | POA: Diagnosis not present

## 2022-09-16 DIAGNOSIS — G4733 Obstructive sleep apnea (adult) (pediatric): Secondary | ICD-10-CM | POA: Diagnosis not present

## 2022-09-16 DIAGNOSIS — I119 Hypertensive heart disease without heart failure: Secondary | ICD-10-CM | POA: Diagnosis not present

## 2022-09-16 DIAGNOSIS — I38 Endocarditis, valve unspecified: Secondary | ICD-10-CM | POA: Diagnosis not present

## 2022-09-16 DIAGNOSIS — R0602 Shortness of breath: Secondary | ICD-10-CM | POA: Diagnosis not present

## 2022-09-16 DIAGNOSIS — I499 Cardiac arrhythmia, unspecified: Secondary | ICD-10-CM | POA: Diagnosis not present

## 2022-09-16 DIAGNOSIS — D649 Anemia, unspecified: Secondary | ICD-10-CM

## 2022-09-16 LAB — HEMOGLOBIN AND HEMATOCRIT (CANCER CENTER ONLY)
HCT: 27.6 % — ABNORMAL LOW (ref 39.0–52.0)
Hemoglobin: 8.5 g/dL — ABNORMAL LOW (ref 13.0–17.0)

## 2022-09-16 MED ORDER — DARBEPOETIN ALFA 300 MCG/0.6ML IJ SOSY
300.0000 ug | PREFILLED_SYRINGE | Freq: Once | INTRAMUSCULAR | Status: AC
  Administered 2022-09-16: 300 ug via SUBCUTANEOUS
  Filled 2022-09-16: qty 0.6

## 2022-09-30 ENCOUNTER — Inpatient Hospital Stay: Payer: Medicare Other

## 2022-09-30 ENCOUNTER — Other Ambulatory Visit: Payer: Self-pay | Admitting: *Deleted

## 2022-09-30 ENCOUNTER — Inpatient Hospital Stay: Payer: Medicare Other | Attending: Nurse Practitioner

## 2022-09-30 VITALS — BP 138/70

## 2022-09-30 DIAGNOSIS — D461 Refractory anemia with ring sideroblasts: Secondary | ICD-10-CM | POA: Diagnosis not present

## 2022-09-30 DIAGNOSIS — D649 Anemia, unspecified: Secondary | ICD-10-CM

## 2022-09-30 LAB — HEMOGLOBIN AND HEMATOCRIT (CANCER CENTER ONLY)
HCT: 26.2 % — ABNORMAL LOW (ref 39.0–52.0)
Hemoglobin: 8.1 g/dL — ABNORMAL LOW (ref 13.0–17.0)

## 2022-09-30 MED ORDER — DARBEPOETIN ALFA 300 MCG/0.6ML IJ SOSY
300.0000 ug | PREFILLED_SYRINGE | Freq: Once | INTRAMUSCULAR | Status: AC
  Administered 2022-09-30: 300 ug via SUBCUTANEOUS

## 2022-09-30 MED ORDER — DARBEPOETIN ALFA 300 MCG/0.6ML IJ SOSY
300.0000 ug | PREFILLED_SYRINGE | Freq: Once | INTRAMUSCULAR | Status: AC
  Administered 2022-09-30: 300 ug via SUBCUTANEOUS
  Filled 2022-09-30: qty 0.6

## 2022-10-01 ENCOUNTER — Other Ambulatory Visit: Payer: Self-pay | Admitting: Oncology

## 2022-10-01 DIAGNOSIS — D461 Refractory anemia with ring sideroblasts: Secondary | ICD-10-CM

## 2022-10-13 ENCOUNTER — Other Ambulatory Visit: Payer: Self-pay | Admitting: *Deleted

## 2022-10-13 DIAGNOSIS — D461 Refractory anemia with ring sideroblasts: Secondary | ICD-10-CM

## 2022-10-14 ENCOUNTER — Inpatient Hospital Stay: Payer: Medicare Other | Admitting: Oncology

## 2022-10-14 ENCOUNTER — Encounter: Payer: Self-pay | Admitting: Oncology

## 2022-10-14 ENCOUNTER — Inpatient Hospital Stay: Payer: Medicare Other

## 2022-10-14 VITALS — BP 122/64 | HR 55 | Temp 96.8°F | Resp 18 | Ht 70.0 in | Wt 250.2 lb

## 2022-10-14 DIAGNOSIS — D649 Anemia, unspecified: Secondary | ICD-10-CM

## 2022-10-14 DIAGNOSIS — Z79899 Other long term (current) drug therapy: Secondary | ICD-10-CM

## 2022-10-14 DIAGNOSIS — D461 Refractory anemia with ring sideroblasts: Secondary | ICD-10-CM | POA: Diagnosis not present

## 2022-10-14 DIAGNOSIS — D469 Myelodysplastic syndrome, unspecified: Secondary | ICD-10-CM

## 2022-10-14 LAB — CBC WITH DIFFERENTIAL/PLATELET
Abs Immature Granulocytes: 0.02 10*3/uL (ref 0.00–0.07)
Basophils Absolute: 0 10*3/uL (ref 0.0–0.1)
Basophils Relative: 1 %
Eosinophils Absolute: 0.1 10*3/uL (ref 0.0–0.5)
Eosinophils Relative: 3 %
HCT: 27.2 % — ABNORMAL LOW (ref 39.0–52.0)
Hemoglobin: 8.4 g/dL — ABNORMAL LOW (ref 13.0–17.0)
Immature Granulocytes: 0 %
Lymphocytes Relative: 20 %
Lymphs Abs: 1 10*3/uL (ref 0.7–4.0)
MCH: 31.3 pg (ref 26.0–34.0)
MCHC: 30.9 g/dL (ref 30.0–36.0)
MCV: 101.5 fL — ABNORMAL HIGH (ref 80.0–100.0)
Monocytes Absolute: 0.4 10*3/uL (ref 0.1–1.0)
Monocytes Relative: 8 %
Neutro Abs: 3.3 10*3/uL (ref 1.7–7.7)
Neutrophils Relative %: 68 %
Platelets: 290 10*3/uL (ref 150–400)
RBC: 2.68 MIL/uL — ABNORMAL LOW (ref 4.22–5.81)
RDW: 23.9 % — ABNORMAL HIGH (ref 11.5–15.5)
WBC: 4.9 10*3/uL (ref 4.0–10.5)
nRBC: 0 % (ref 0.0–0.2)

## 2022-10-14 LAB — COMPREHENSIVE METABOLIC PANEL WITH GFR
ALT: 11 U/L (ref 0–44)
AST: 15 U/L (ref 15–41)
Albumin: 3.6 g/dL (ref 3.5–5.0)
Alkaline Phosphatase: 38 U/L (ref 38–126)
Anion gap: 3 — ABNORMAL LOW (ref 5–15)
BUN: 20 mg/dL (ref 8–23)
CO2: 26 mmol/L (ref 22–32)
Calcium: 8.5 mg/dL — ABNORMAL LOW (ref 8.9–10.3)
Chloride: 104 mmol/L (ref 98–111)
Creatinine, Ser: 0.98 mg/dL (ref 0.61–1.24)
GFR, Estimated: >60 mL/min (ref >60)
Glucose, Bld: 120 mg/dL — ABNORMAL HIGH (ref 70–99)
Potassium: 4.3 mmol/L (ref 3.5–5.1)
Sodium: 133 mmol/L — ABNORMAL LOW (ref 135–145)
Total Bilirubin: 1.4 mg/dL — ABNORMAL HIGH (ref 0.3–1.2)
Total Protein: 6.7 g/dL (ref 6.5–8.1)

## 2022-10-14 LAB — IRON AND TIBC
Iron: 104 ug/dL (ref 45–182)
Saturation Ratios: 46 % — ABNORMAL HIGH (ref 17.9–39.5)
TIBC: 225 ug/dL — ABNORMAL LOW (ref 250–450)
UIBC: 121 ug/dL

## 2022-10-14 LAB — FERRITIN: Ferritin: 954 ng/mL — ABNORMAL HIGH (ref 24–336)

## 2022-10-15 ENCOUNTER — Inpatient Hospital Stay: Payer: Medicare Other

## 2022-10-15 ENCOUNTER — Telehealth: Payer: Self-pay | Admitting: *Deleted

## 2022-10-15 VITALS — BP 121/64

## 2022-10-15 DIAGNOSIS — D461 Refractory anemia with ring sideroblasts: Secondary | ICD-10-CM | POA: Diagnosis not present

## 2022-10-15 LAB — GAMMA GT: GGT: 25 U/L (ref 7–50)

## 2022-10-15 MED ORDER — DARBEPOETIN ALFA 300 MCG/0.6ML IJ SOSY
300.0000 ug | PREFILLED_SYRINGE | Freq: Once | INTRAMUSCULAR | Status: AC
  Administered 2022-10-15: 300 ug via SUBCUTANEOUS
  Filled 2022-10-15: qty 0.6

## 2022-10-15 NOTE — Telephone Encounter (Signed)
Spoke to [place] about the injection for pt. She states that she spoke to him and he wants to continue with arenesp 600 mg every 2 weeks and he can come today to get inj. At 2:30. I did check to make sure they have the aranesp and pharmacy  has it. Pt will come at 2:30 today.

## 2022-10-18 ENCOUNTER — Encounter: Payer: Self-pay | Admitting: Oncology

## 2022-10-18 NOTE — Progress Notes (Signed)
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 Bone marrow biopsy on 09/11/2019 revealed macrocytic anemia and hypercellular bone marrow for age with dyspoietic changes primarily involving the erythroid cell lines associated with abundant ring sideroblasts (> 15%).  There was no increase in blasts. Flow cytometry was negative. Cytogenetics revealed 45,X,-Y [20].  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 IPSS score is low.   Baseline EPO level was 63.3 in August 2021.  Patient received Aranesp for a month in September 2021 and was then switched to Retacrit.  He was again switched to aranesp   As per insurance preference patient found to have elevated ferritin and when we was under the care of Dr. 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   MRI abdomen in September 2021 showed 1.3 cm lesion in the left kidney concerning for renal cell carcinoma for which he follows up with urology    Interval history-patient is tolerating aranesp as 600 units every other week well without any significant side effects.  Energy levels are more or less stable but there are days when he reports more fatigue  ECOG PS- 1 Pain scale- 0   Review of systems- Review of Systems  Constitutional:  Positive for malaise/fatigue. Negative for chills, fever and weight loss.  HENT:  Negative for  congestion, ear discharge and nosebleeds.   Eyes:  Negative for blurred vision.  Respiratory:  Negative for cough, hemoptysis, sputum production, shortness of breath and wheezing.   Cardiovascular:  Negative for chest pain, palpitations, orthopnea and claudication.  Gastrointestinal:  Negative for abdominal pain, blood in stool, constipation, diarrhea, heartburn, melena, nausea and vomiting.  Genitourinary:  Negative for dysuria, flank pain, frequency, hematuria and urgency.  Musculoskeletal:  Negative for back pain, joint pain and myalgias.  Skin:  Negative for rash.  Neurological:  Negative for dizziness, tingling, focal weakness, seizures, weakness and headaches.  Endo/Heme/Allergies:  Does not bruise/bleed easily.  Psychiatric/Behavioral:  Negative for depression and suicidal ideas. The patient does not have insomnia.       Allergies  Allergen Reactions   Amoxicillin-Pot Clavulanate Hives and Nausea And Vomiting     Past Medical History:  Diagnosis Date   Anemia    Heart murmur    Hydrocele    MDS (myelodysplastic syndrome) (HCC)    Rotator cuff tear arthropathy of right shoulder 08/01/2020   Venous stasis dermatitis of both lower extremities      Past Surgical History:  Procedure Laterality Date   CATARACT EXTRACTION W/PHACO Right 03/09/2017   Procedure: CATARACT EXTRACTION PHACO AND INTRAOCULAR LENS PLACEMENT (IOC) RIGHT;  Surgeon: Nevada Crane, MD;  Location: [facility];  Service: Ophthalmology;  Laterality: Right;   CATARACT EXTRACTION W/PHACO Left 08/25/2021   Procedure: CATARACT EXTRACTION PHACO AND INTRAOCULAR LENS PLACEMENT (IOC) LEFT;  Surgeon: Nevada Crane, MD;  Location: [facility];  Service: Ophthalmology;  Laterality: Left;   CATARACT  EXTRACTION W/PHACO Left 10/13/2021   Procedure: CATARACT EXTRACTION PHACO AND INTRAOCULAR LENS PLACEMENT (IOC) LEFT;  Surgeon: Nevada Crane, MD;  Location: [facility];  Service:  Ophthalmology;  Laterality: Left;  8.12 0:52.0   COLONOSCOPY  2013   normal- cleared for 5 yrs- Dr Theodoro Parma   COLONOSCOPY WITH PROPOFOL N/A 02/17/2017   Procedure: COLONOSCOPY WITH PROPOFOL;  Surgeon: Toney Reil, MD;  Location: [facility];  Service: Endoscopy;  Laterality: N/A;   COLONOSCOPY WITH PROPOFOL N/A 10/12/2019   Procedure: COLONOSCOPY WITH BIOPSY;  Surgeon: Toney Reil, MD;  Location: [facility];  Service: Endoscopy;  Laterality: N/A;   EPIGASTRIC HERNIA REPAIR N/A 07/06/2014   Procedure: HERNIA REPAIR EPIGASTRIC ADULT;  Surgeon: Duwaine Maxin, MD;  Location: [facility];  Service: General;  Laterality: N/A;   HERNIA REPAIR Bilateral    INSERTION OF MESH N/A 07/06/2014   Procedure: INSERTION OF MESH;  Surgeon: Duwaine Maxin, MD;  Location: [facility];  Service: General;  Laterality: N/A;   IR RADIOLOGIST EVAL & MGMT  11/07/2019   IR RADIOLOGIST EVAL & MGMT  04/25/2020   IR RADIOLOGIST EVAL & MGMT  12/05/2020   IR RADIOLOGIST EVAL & MGMT  06/30/2021   POLYPECTOMY  02/17/2017   Procedure: POLYPECTOMY;  Surgeon: Toney Reil, MD;  Location: [facility];  Service: Endoscopy;;   POLYPECTOMY N/A 10/12/2019   Procedure: POLYPECTOMY;  Surgeon: Toney Reil, MD;  Location: [facility];  Service: Endoscopy;  Laterality: N/A;   SHOULDER ARTHROSCOPY Left 2008   SINUS EXPLORATION      Social History   Socioeconomic History   Marital status: Married    Spouse name: Not on file   Number of children: 3   Years of education: Not on file   Highest education level: Bachelor's degree (e.g., BA, AB, BS)  Occupational History   Occupation: Retired  Tobacco Use   Smoking status: Former    Current packs/day: 0.00    Average packs/day: 0.3 packs/day for 50.0 years (12.5 ttl pk-yrs)    Types: Cigarettes    Start date: 05/02/1968    Quit date: 05/03/2018    Years since quitting: 4.4   Smokeless tobacco: Former   Tobacco comments:    pt  quit smoking  Vaping Use   Vaping status: Never Used  Substance and Sexual Activity   Alcohol use: Yes    Comment: Occasional -1x/month   Drug use: No   Sexual activity: Yes  Other Topics Concern   Not on file  Social History Narrative   Not on file   Social Determinants of Health   Financial Resource Strain: Low Risk  (03/12/2022)   Overall Financial Resource Strain (CARDIA)    Difficulty of Paying Living Expenses: Not hard at all  Food Insecurity: No Food Insecurity (07/20/2022)   Hunger Vital Sign    Worried About Running Out of Food in the Last Year: Never true    Ran Out of Food in the Last Year: Never true  Transportation Needs: No Transportation Needs (07/20/2022)   PRAPARE - Administrator, Civil Service (Medical): No    Lack of Transportation (Non-Medical): No  Physical Activity: Sufficiently Active (03/12/2022)   Exercise Vital Sign    Days of Exercise per Week: 4 days    Minutes of Exercise per Session: 40 min  Stress: No Stress Concern Present (03/12/2022)   [employer] of Occupational Health - Occupational Stress Questionnaire    Feeling  of Stress : Not at all  Social Connections: Moderately Integrated (03/12/2022)   Social Connection and Isolation Panel [NHANES]    Frequency of Communication with Friends and Family: More than three times a week    Frequency of Social Gatherings with Friends and Family: More than three times a week    Attends Religious Services: More than 4 times per year    Active Member of [employer] or Organizations: No    Attends Banker Meetings: Never    Marital Status: Married  Catering manager Violence: Not At Risk (07/16/2022)   Humiliation, Afraid, Rape, and Kick questionnaire    Fear of Current or Ex-Partner: No    Emotionally Abused: No    Physically Abused: No    Sexually Abused: No    Family History  Problem Relation Age of Onset   Diabetes Father    Heart disease Father 65   Stroke Mother       Current Outpatient Medications:    acetaminophen (TYLENOL) 325 MG tablet, Take 650 mg by mouth every 6 (six) hours as needed., Disp: , Rfl:    Calcium Carb-Cholecalciferol 600-400 MG-UNIT TABS, Take by mouth., Disp: , Rfl:    Darbepoetin Alfa (ARANESP) 25 MCG/0.42ML SOSY injection, Inject 25 mcg into the skin., Disp: , Rfl:    meloxicam (MOBIC) 15 MG tablet, Take 1 tablet (15 mg total) by mouth daily., Disp: 30 tablet, Rfl: 0   mupirocin ointment (BACTROBAN) 2 %, Apply 1 Application topically 2 (two) times daily., Disp: 22 g, Rfl: 0   tadalafil (CIALIS) 5 MG tablet, Take 1-4 tablets (5-20 mg total) by mouth daily as needed for erectile dysfunction., Disp: 90 tablet, Rfl: 6 No current facility-administered medications for this visit.  Facility-Administered Medications Ordered in Other Visits:    Darbepoetin Alfa (ARANESP) injection 300 mcg, 300 mcg, Subcutaneous, Weekly, Creig Hines, MD, 300 mcg at 02/13/22 1021   Darbepoetin Alfa (ARANESP) injection 600 mcg, 600 mcg, Subcutaneous, Q14 Days, Creig Hines, MD, 600 mcg at 03/13/22 1032   Darbepoetin Alfa (ARANESP) injection 600 mcg, 600 mcg, Subcutaneous, Q14 Days, Creig Hines, MD, 600 mcg at 06/12/22 1052  Physical exam:  Vitals:   10/14/22 1130  BP: 122/64  Pulse: (!) 55  Resp: 18  Temp: (!) 96.8 F (36 C)  TempSrc: Tympanic  SpO2: 100%  Weight: 250 lb 3.2 oz (113.5 kg)  Height: 5\' 10"  (1.778 m)   Physical Exam Cardiovascular:     Rate and Rhythm: Normal rate and regular rhythm.     Heart sounds: Normal heart sounds.  Pulmonary:     Effort: Pulmonary effort is normal.     Breath sounds: Normal breath sounds.  Skin:    General: Skin is warm and dry.  Neurological:     Mental Status: He is alert and oriented to person, place, and time.         Latest Ref Rng & Units 10/14/2022   11:08 AM  CMP  Glucose 70 - 99 mg/dL 478   BUN 8 - 23 mg/dL 20   Creatinine 2.95 - 1.24 mg/dL 6.21   Sodium 308 - 657 mmol/L 133    Potassium 3.5 - 5.1 mmol/L 4.3   Chloride 98 - 111 mmol/L 104   CO2 22 - 32 mmol/L 26   Calcium 8.9 - 10.3 mg/dL 8.5   Total Protein 6.5 - 8.1 g/dL 6.7   Total Bilirubin 0.3 - 1.2 mg/dL 1.4   Alkaline Phos 38 -  126 U/L 38   AST 15 - 41 U/L 15   ALT 0 - 44 U/L 11       Latest Ref Rng & Units 10/14/2022   11:08 AM  CBC  WBC 4.0 - 10.5 K/uL 4.9   Hemoglobin 13.0 - 17.0 g/dL 8.4   Hematocrit 78.2 - 52.0 % 27.2   Platelets 150 - 400 K/uL 290     Assessment and plan- Patient is a 77 y.o. male with low risk MDS and SF3B1 mutation here for routine follow-up  Patient is currently on max doses of Aranesp at 600 units every other week.  Over the last 3 months his hemoglobin has remained around 8.5 and has not gone up any further.  There is no other reversible causes of anemia.  We discussed that we could switch him to Luspatercept given that he has SF 3 B1 mutation.  This would be given once every 3 weeks.  Discussed risks and benefits of Luspatercept including all but not limited to hypertension peripheral edema nausea vomiting diarrhea abnormal LFTs.  We would typically start at a low dose of 1 mg/kg and slowly titrated upwards based on response.  Patient would like to continue with Aranesp at this time and see if his hemoglobin can get better over the next 1 to 2 months before proceeding with aranesp.   Visit Diagnosis 1. Erythropoietin (EPO) stimulating agent anemia management patient   2. Myelodysplastic syndrome (HCC)      Dr. Owens Shark, MD, MPH [facility] [phone] 10/18/2022 9:13 PM

## 2022-10-19 ENCOUNTER — Inpatient Hospital Stay: Payer: Medicare Other

## 2022-10-19 ENCOUNTER — Inpatient Hospital Stay: Payer: Medicare Other | Admitting: Oncology

## 2022-10-28 ENCOUNTER — Inpatient Hospital Stay: Payer: Medicare Other | Attending: Nurse Practitioner

## 2022-10-28 ENCOUNTER — Inpatient Hospital Stay: Payer: Medicare Other

## 2022-10-28 VITALS — BP 125/65 | HR 72

## 2022-10-28 DIAGNOSIS — D461 Refractory anemia with ring sideroblasts: Secondary | ICD-10-CM

## 2022-10-28 LAB — CBC WITH DIFFERENTIAL/PLATELET
Abs Immature Granulocytes: 0.01 10*3/uL (ref 0.00–0.07)
Basophils Absolute: 0.1 10*3/uL (ref 0.0–0.1)
Basophils Relative: 1 %
Eosinophils Absolute: 0.1 10*3/uL (ref 0.0–0.5)
Eosinophils Relative: 1 %
HCT: 29.3 % — ABNORMAL LOW (ref 39.0–52.0)
Hemoglobin: 8.9 g/dL — ABNORMAL LOW (ref 13.0–17.0)
Immature Granulocytes: 0 %
Lymphocytes Relative: 21 %
Lymphs Abs: 1.2 10*3/uL (ref 0.7–4.0)
MCH: 30.6 pg (ref 26.0–34.0)
MCHC: 30.4 g/dL (ref 30.0–36.0)
MCV: 100.7 fL — ABNORMAL HIGH (ref 80.0–100.0)
Monocytes Absolute: 0.5 10*3/uL (ref 0.1–1.0)
Monocytes Relative: 8 %
Neutro Abs: 3.8 10*3/uL (ref 1.7–7.7)
Neutrophils Relative %: 69 %
Platelets: 301 10*3/uL (ref 150–400)
RBC: 2.91 MIL/uL — ABNORMAL LOW (ref 4.22–5.81)
RDW: 24 % — ABNORMAL HIGH (ref 11.5–15.5)
WBC: 5.6 10*3/uL (ref 4.0–10.5)
nRBC: 0 % (ref 0.0–0.2)

## 2022-10-28 MED ORDER — DARBEPOETIN ALFA 300 MCG/0.6ML IJ SOSY
300.0000 ug | PREFILLED_SYRINGE | Freq: Once | INTRAMUSCULAR | Status: AC
  Administered 2022-10-28: 300 ug via SUBCUTANEOUS
  Filled 2022-10-28: qty 0.6

## 2022-10-28 NOTE — Patient Instructions (Signed)
Per your request. I have attached information about Luspatercept.

## 2022-11-10 ENCOUNTER — Other Ambulatory Visit: Payer: Self-pay | Admitting: *Deleted

## 2022-11-10 DIAGNOSIS — D469 Myelodysplastic syndrome, unspecified: Secondary | ICD-10-CM

## 2022-11-11 ENCOUNTER — Inpatient Hospital Stay: Payer: Medicare Other

## 2022-11-11 ENCOUNTER — Inpatient Hospital Stay: Payer: Medicare Other | Admitting: Oncology

## 2022-11-11 ENCOUNTER — Encounter: Payer: Self-pay | Admitting: Oncology

## 2022-11-11 VITALS — BP 139/78 | HR 65 | Temp 98.5°F | Resp 18 | Wt 253.3 lb

## 2022-11-11 DIAGNOSIS — D461 Refractory anemia with ring sideroblasts: Secondary | ICD-10-CM

## 2022-11-11 DIAGNOSIS — Z79899 Other long term (current) drug therapy: Secondary | ICD-10-CM | POA: Diagnosis not present

## 2022-11-11 DIAGNOSIS — D649 Anemia, unspecified: Secondary | ICD-10-CM

## 2022-11-11 DIAGNOSIS — D469 Myelodysplastic syndrome, unspecified: Secondary | ICD-10-CM

## 2022-11-11 LAB — CBC WITH DIFFERENTIAL (CANCER CENTER ONLY)
Abs Immature Granulocytes: 0.01 10*3/uL (ref 0.00–0.07)
Basophils Absolute: 0 10*3/uL (ref 0.0–0.1)
Basophils Relative: 1 %
Eosinophils Absolute: 0.2 10*3/uL (ref 0.0–0.5)
Eosinophils Relative: 4 %
HCT: 28.6 % — ABNORMAL LOW (ref 39.0–52.0)
Hemoglobin: 8.8 g/dL — ABNORMAL LOW (ref 13.0–17.0)
Immature Granulocytes: 0 %
Lymphocytes Relative: 23 %
Lymphs Abs: 1.2 10*3/uL (ref 0.7–4.0)
MCH: 31 pg (ref 26.0–34.0)
MCHC: 30.8 g/dL (ref 30.0–36.0)
MCV: 100.7 fL — ABNORMAL HIGH (ref 80.0–100.0)
Monocytes Absolute: 0.4 10*3/uL (ref 0.1–1.0)
Monocytes Relative: 7 %
Neutro Abs: 3.3 10*3/uL (ref 1.7–7.7)
Neutrophils Relative %: 65 %
Platelet Count: 281 10*3/uL (ref 150–400)
RBC: 2.84 MIL/uL — ABNORMAL LOW (ref 4.22–5.81)
RDW: 24.6 % — ABNORMAL HIGH (ref 11.5–15.5)
WBC Count: 5.1 10*3/uL (ref 4.0–10.5)
nRBC: 0 % (ref 0.0–0.2)

## 2022-11-11 LAB — IRON AND TIBC
Iron: 105 ug/dL (ref 45–182)
Saturation Ratios: 48 % — ABNORMAL HIGH (ref 17.9–39.5)
TIBC: 217 ug/dL — ABNORMAL LOW (ref 250–450)
UIBC: 112 ug/dL

## 2022-11-11 LAB — CMP (CANCER CENTER ONLY)
ALT: 11 U/L (ref 0–44)
AST: 16 U/L (ref 15–41)
Albumin: 3.6 g/dL (ref 3.5–5.0)
Alkaline Phosphatase: 41 U/L (ref 38–126)
Anion gap: 5 (ref 5–15)
BUN: 15 mg/dL (ref 8–23)
CO2: 26 mmol/L (ref 22–32)
Calcium: 9.1 mg/dL (ref 8.9–10.3)
Chloride: 105 mmol/L (ref 98–111)
Creatinine: 1.17 mg/dL (ref 0.61–1.24)
GFR, Estimated: >60 mL/min (ref >60)
Glucose, Bld: 113 mg/dL — ABNORMAL HIGH (ref 70–99)
Potassium: 4.7 mmol/L (ref 3.5–5.1)
Sodium: 136 mmol/L (ref 135–145)
Total Bilirubin: 1.1 mg/dL (ref 0.3–1.2)
Total Protein: 6.6 g/dL (ref 6.5–8.1)

## 2022-11-11 LAB — FERRITIN: Ferritin: 902 ng/mL — ABNORMAL HIGH (ref 24–336)

## 2022-11-11 MED ORDER — DARBEPOETIN ALFA 300 MCG/0.6ML IJ SOSY
300.0000 ug | PREFILLED_SYRINGE | Freq: Once | INTRAMUSCULAR | Status: AC
  Administered 2022-11-11: 300 ug via SUBCUTANEOUS
  Filled 2022-11-11: qty 0.6

## 2022-11-11 NOTE — Progress Notes (Signed)
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 Bone marrow biopsy on 09/11/2019 revealed macrocytic anemia and hypercellular bone marrow for age with dyspoietic changes primarily involving the erythroid cell lines associated with abundant ring sideroblasts (> 15%).  There was no increase in blasts. Flow cytometry was negative. Cytogenetics revealed 45,X,-Y [20].  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 IPSS score is low.   Baseline EPO level was 63.3 in August 2021.  Patient received Aranesp for a month in September 2021 and was then switched to Retacrit.  He was again switched to aranesp   As per insurance preference patient found to have elevated ferritin and when we was under the care of Dr. 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   MRI abdomen in September 2021 showed 1.3 cm lesion in the left kidney concerning for renal cell carcinoma for which he follows up with urology    Interval history-patient continues to enjoy a good quality of life and is able to do IADLs and ADLs without any significant fatigue.  He has occasional days of fatigue but overall feels well.  ECOG PS- 1 Pain scale- 0   Review of systems- Review of Systems  Constitutional:  Negative for chills, fever, malaise/fatigue and weight loss.  HENT:  Negative for congestion, ear discharge  and nosebleeds.   Eyes:  Negative for blurred vision.  Respiratory:  Negative for cough, hemoptysis, sputum production, shortness of breath and wheezing.   Cardiovascular:  Negative for chest pain, palpitations, orthopnea and claudication.  Gastrointestinal:  Negative for abdominal pain, blood in stool, constipation, diarrhea, heartburn, melena, nausea and vomiting.  Genitourinary:  Negative for dysuria, flank pain, frequency, hematuria and urgency.  Musculoskeletal:  Negative for back pain, joint pain and myalgias.  Skin:  Negative for rash.  Neurological:  Negative for dizziness, tingling, focal weakness, seizures, weakness and headaches.  Endo/Heme/Allergies:  Does not bruise/bleed easily.  Psychiatric/Behavioral:  Negative for depression and suicidal ideas. The patient does not have insomnia.       Allergies  Allergen Reactions   Amoxicillin-Pot Clavulanate Hives and Nausea And Vomiting     Past Medical History:  Diagnosis Date   Anemia    Heart murmur    Hydrocele    MDS (myelodysplastic syndrome) (HCC)    Rotator cuff tear arthropathy of right shoulder 08/01/2020   Venous stasis dermatitis of both lower extremities      Past Surgical History:  Procedure Laterality Date   CATARACT EXTRACTION W/PHACO Right 03/09/2017   Procedure: CATARACT EXTRACTION PHACO AND INTRAOCULAR LENS PLACEMENT (IOC) RIGHT;  Surgeon: Nevada Crane, MD;  Location: [facility];  Service: Ophthalmology;  Laterality: Right;   CATARACT EXTRACTION W/PHACO Left 08/25/2021   Procedure: CATARACT EXTRACTION PHACO AND INTRAOCULAR LENS PLACEMENT (IOC) LEFT;  Surgeon: Nevada Crane, MD;  Location: [facility];  Service: Ophthalmology;  Laterality: Left;   CATARACT EXTRACTION W/PHACO Left 10/13/2021  Procedure: CATARACT EXTRACTION PHACO AND INTRAOCULAR LENS PLACEMENT (IOC) LEFT;  Surgeon: Nevada Crane, MD;  Location: [facility];  Service: Ophthalmology;  Laterality: Left;   8.12 0:52.0   COLONOSCOPY  2013   normal- cleared for 5 yrs- Dr Theodoro Parma   COLONOSCOPY WITH PROPOFOL N/A 02/17/2017   Procedure: COLONOSCOPY WITH PROPOFOL;  Surgeon: Toney Reil, MD;  Location: [facility];  Service: Endoscopy;  Laterality: N/A;   COLONOSCOPY WITH PROPOFOL N/A 10/12/2019   Procedure: COLONOSCOPY WITH BIOPSY;  Surgeon: Toney Reil, MD;  Location: [facility];  Service: Endoscopy;  Laterality: N/A;   EPIGASTRIC HERNIA REPAIR N/A 07/06/2014   Procedure: HERNIA REPAIR EPIGASTRIC ADULT;  Surgeon: Duwaine Maxin, MD;  Location: [facility] ORS;  Service: General;  Laterality: N/A;   HERNIA REPAIR Bilateral    INSERTION OF MESH N/A 07/06/2014   Procedure: INSERTION OF MESH;  Surgeon: Duwaine Maxin, MD;  Location: [facility];  Service: General;  Laterality: N/A;   IR RADIOLOGIST EVAL & MGMT  11/07/2019   IR RADIOLOGIST EVAL & MGMT  04/25/2020   IR RADIOLOGIST EVAL & MGMT  12/05/2020   IR RADIOLOGIST EVAL & MGMT  06/30/2021   POLYPECTOMY  02/17/2017   Procedure: POLYPECTOMY;  Surgeon: Toney Reil, MD;  Location: [facility];  Service: Endoscopy;;   POLYPECTOMY N/A 10/12/2019   Procedure: POLYPECTOMY;  Surgeon: Toney Reil, MD;  Location: [facility];  Service: Endoscopy;  Laterality: N/A;   SHOULDER ARTHROSCOPY Left 2008   SINUS EXPLORATION      Social History   Socioeconomic History   Marital status: Married    Spouse name: Not on file   Number of children: 3   Years of education: Not on file   Highest education level: Bachelor's degree (e.g., BA, AB, BS)  Occupational History   Occupation: Retired  Tobacco Use   Smoking status: Former    Current packs/day: 0.00    Average packs/day: 0.3 packs/day for 50.0 years (12.5 ttl pk-yrs)    Types: Cigarettes    Start date: 05/02/1968    Quit date: 05/03/2018    Years since quitting: 4.5   Smokeless tobacco: Former   Tobacco comments:    pt quit smoking  Vaping Use    Vaping status: Never Used  Substance and Sexual Activity   Alcohol use: Yes    Comment: Occasional -1x/month   Drug use: No   Sexual activity: Yes  Other Topics Concern   Not on file  Social History Narrative   Not on file   Social Determinants of Health   Financial Resource Strain: Low Risk  (03/12/2022)   Overall Financial Resource Strain (CARDIA)    Difficulty of Paying Living Expenses: Not hard at all  Food Insecurity: No Food Insecurity (07/20/2022)   Hunger Vital Sign    Worried About Running Out of Food in the Last Year: Never true    Ran Out of Food in the Last Year: Never true  Transportation Needs: No Transportation Needs (07/20/2022)   PRAPARE - Administrator, Civil Service (Medical): No    Lack of Transportation (Non-Medical): No  Physical Activity: Sufficiently Active (03/12/2022)   Exercise Vital Sign    Days of Exercise per Week: 4 days    Minutes of Exercise per Session: 40 min  Stress: No Stress Concern Present (03/12/2022)   [employer] of Occupational Health - Occupational Stress Questionnaire    Feeling of Stress : Not at all  Social Connections: Moderately Integrated (03/12/2022)   Social Connection and Isolation Panel [NHANES]    Frequency of Communication with Friends and Family: More than three times a week    Frequency of Social Gatherings with Friends and Family: More than three times a week    Attends Religious Services: More than 4 times per year    Active Member of [employer] or Organizations: No    Attends Banker Meetings: Never    Marital Status: Married  Catering manager Violence: Not At Risk (07/16/2022)   Humiliation, Afraid, Rape, and Kick questionnaire    Fear of Current or Ex-Partner: No    Emotionally Abused: No    Physically Abused: No    Sexually Abused: No    Family History  Problem Relation Age of Onset   Diabetes Father    Heart disease Father 68   Stroke Mother      Current Outpatient Medications:     acetaminophen (TYLENOL) 325 MG tablet, Take 650 mg by mouth every 6 (six) hours as needed., Disp: , Rfl:    Calcium Carb-Cholecalciferol 600-400 MG-UNIT TABS, Take by mouth., Disp: , Rfl:    Darbepoetin Alfa (ARANESP) 25 MCG/0.42ML SOSY injection, Inject 25 mcg into the skin., Disp: , Rfl:    meloxicam (MOBIC) 15 MG tablet, Take 1 tablet (15 mg total) by mouth daily., Disp: 30 tablet, Rfl: 0   mupirocin ointment (BACTROBAN) 2 %, Apply 1 Application topically 2 (two) times daily., Disp: 22 g, Rfl: 0   tadalafil (CIALIS) 5 MG tablet, Take 1-4 tablets (5-20 mg total) by mouth daily as needed for erectile dysfunction., Disp: 90 tablet, Rfl: 6 No current facility-administered medications for this visit.  Facility-Administered Medications Ordered in Other Visits:    Darbepoetin Alfa (ARANESP) injection 300 mcg, 300 mcg, Subcutaneous, Weekly, Creig Hines, MD, 300 mcg at 02/13/22 1021   Darbepoetin Alfa (ARANESP) injection 600 mcg, 600 mcg, Subcutaneous, Q14 Days, Creig Hines, MD, 600 mcg at 03/13/22 1032   Darbepoetin Alfa (ARANESP) injection 600 mcg, 600 mcg, Subcutaneous, Q14 Days, Creig Hines, MD, 600 mcg at 06/12/22 1052  Physical exam:  Vitals:   11/11/22 1138  BP: 139/78  Pulse: 65  Resp: 18  Temp: 98.5 F (36.9 C)  TempSrc: Tympanic  SpO2: 100%  Weight: 253 lb 4.8 oz (114.9 kg)   Physical Exam Cardiovascular:     Rate and Rhythm: Normal rate and regular rhythm.     Heart sounds: Normal heart sounds.  Pulmonary:     Effort: Pulmonary effort is normal.  Skin:    General: Skin is warm and dry.  Neurological:     Mental Status: He is alert and oriented to person, place, and time.         Latest Ref Rng & Units 11/11/2022   11:03 AM  CMP  Glucose 70 - 99 mg/dL 098   BUN 8 - 23 mg/dL 15   Creatinine 1.19 - 1.24 mg/dL 1.47   Sodium 829 - 562 mmol/L 136   Potassium 3.5 - 5.1 mmol/L 4.7   Chloride 98 - 111 mmol/L 105   CO2 22 - 32 mmol/L 26   Calcium 8.9 - 10.3  mg/dL 9.1   Total Protein 6.5 - 8.1 g/dL 6.6   Total Bilirubin 0.3 - 1.2 mg/dL 1.1   Alkaline Phos 38 - 126 U/L 41   AST 15 - 41 U/L 16   ALT 0 - 44 U/L 11  Latest Ref Rng & Units 11/11/2022   11:03 AM  CBC  WBC 4.0 - 10.5 K/uL 5.1   Hemoglobin 13.0 - 17.0 g/dL 8.8   Hematocrit 16.1 - 52.0 % 28.6   Platelets 150 - 400 K/uL 281     Assessment and plan- Patient is a 77 y.o. 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  He is already on max doses of Aranesp 600 units every other week.  Options include switching to Luspatercept given that he has SF3 B1 mutation versus continuing iron as given that he still has a good quality of life.  Patient does not wish to switch at this time.  He will continue to get H&H and iron test every 2 weeks and I will see him back in 3 months with CBC with differentialAnd CMP.  Although patient's ferritin levels are elevated and there was iron deposition noted in his liver on his MRI in May 2024.  This had not changed as compared to priors can in June 2023.  Abnormal LFTs are not noted presently.  He cannot receive any phlebotomy at this time due to his anemia.  Continue to monitor   Visit Diagnosis 1. Myelodysplastic syndrome (HCC)   2. Erythropoietin (EPO) stimulating agent anemia management patient      Dr. Owens Shark, MD, MPH [facility] [phone] 11/11/2022 12:47 PM

## 2022-11-18 ENCOUNTER — Ambulatory Visit: Payer: Self-pay | Admitting: *Deleted

## 2022-11-18 ENCOUNTER — Encounter: Payer: Self-pay | Admitting: Physician Assistant

## 2022-11-18 ENCOUNTER — Ambulatory Visit (INDEPENDENT_AMBULATORY_CARE_PROVIDER_SITE_OTHER): Payer: Medicare Other | Admitting: Physician Assistant

## 2022-11-18 VITALS — BP 116/72 | HR 56 | Temp 97.7°F | Ht 70.0 in | Wt 247.0 lb

## 2022-11-18 DIAGNOSIS — L03115 Cellulitis of right lower limb: Secondary | ICD-10-CM

## 2022-11-18 MED ORDER — DOXYCYCLINE HYCLATE 100 MG PO TABS
100.0000 mg | ORAL_TABLET | Freq: Two times a day (BID) | ORAL | 0 refills | Status: AC
Start: 2022-11-18 — End: 2022-11-25

## 2022-11-18 NOTE — Telephone Encounter (Signed)
  Chief Complaint: R ankle swelling/pain Symptoms: R ankle swelling/pain- moderate with weeping Frequency: started 2 days ago Pertinent Negatives: Patient denies fever, chest pain, difficulty breathing, calf pain  Disposition: [] ED /[] Urgent Care (no appt availability in office) / [x] Appointment(In office/virtual)/ []  [facility]/ [] Home Care/ [] Refused Recommended Disposition /[] [facility]/ []  Follow-up with PCP Additional Notes: Patient is calling to report he has new swelling in R ankle- with pain. Patient is concerned due to hx cellulitis- appointment has been scheduled.

## 2022-11-18 NOTE — Telephone Encounter (Signed)
Reason for Disposition  [1] Redness AND [2] painful when touched AND [3] no fever  Answer Assessment - Initial Assessment Questions 1. LOCATION: "Which ankle is swollen?" "Where is the swelling?"     Right ankle 2. ONSET: "When did the swelling start?"     2 days 3. SWELLING: "How bad is the swelling?" Or, "How large is it?" (e.g., mild, moderate, severe; size of localized swelling)    - NONE: No joint swelling.   - LOCALIZED: Localized; small area of puffy or swollen skin (e.g., insect bite, skin irritation).   - MILD: Joint looks or feels mildly swollen or puffy.   - MODERATE: Swollen; interferes with normal activities (e.g., work or school); decreased range of movement; may be limping.   - SEVERE: Very swollen; can't move swollen joint at all; limping a lot or unable to walk.     Swelling goes way at night- patient uses compression sock- pain is worse than swelling 4. PAIN: "Is there any pain?" If Yes, ask: "How bad is it?" (Scale 1-10; or mild, moderate, severe)   - NONE (0): no pain.   - MILD (1-3): doesn't interfere with normal activities.    - MODERATE (4-7): interferes with normal activities (e.g., work or school) or awakens from sleep, limping.    - SEVERE (8-10): excruciating pain, unable to do any normal activities, unable to walk.      4/10, moderate 5. CAUSE: "What do you think caused the ankle swelling?"     No idea- few days ago - had weeping from small area-hx cellulitis 6. OTHER SYMPTOMS: "Do you have any other symptoms?" (e.g., fever, chest pain, difficulty breathing, calf pain)     no  Protocols used: Ankle Swelling-A-AH

## 2022-11-18 NOTE — Progress Notes (Signed)
Date:  11/18/2022   Name:  Brett Boone   DOB:  02/19/1945   MRN:  [id number]   Chief Complaint: Ankle Pain  Ankle Pain  Incident onset: X2 weeks. There was no injury mechanism. The pain is present in the right ankle. The quality of the pain is described as aching and burning. The pain is at a severity of 4/10. The pain is mild. The pain has been Constant since onset. Associated symptoms comments: In toes. He reports no foreign bodies present. The symptoms are aggravated by movement, palpation and weight bearing. He has tried ice (eucerin cream, heel balm, bactoban cream, cortizone) for the symptoms. The treatment provided mild relief.   Skyden presents today for 1-2-week history of right lower leg pain, redness, and swelling.  Initially noticed break in the skin (no known trauma) on the right dorsolateral foot which he treated with mupirocin.  This seemed to be working at first, but then he started developing crusting/scaling of the dorsal foot along with redness and pain along the shin.  States it hurts to bear weight on this foot, especially on the underside of the heel, but does not hurt deep within the ankle, "feels more at the surface".    Importantly, 4 months ago he brought me a similar complaint on the opposite leg that was associated with systemic symptoms including fever and chills, which ended up being sepsis seen in the hospital with IV antibiotics.  Still has oral doxycycline prescribed by me at that visit, but never taken (since he was admitted).   Medication list has been reviewed and updated.  Current Meds  Medication Sig   acetaminophen (TYLENOL) 325 MG tablet Take 650 mg by mouth every 6 (six) hours as needed.   Calcium Carb-Cholecalciferol 600-400 MG-UNIT TABS Take by mouth.   Darbepoetin Alfa (ARANESP) 25 MCG/0.42ML SOSY injection Inject 25 mcg into the skin.   meloxicam (MOBIC) 15 MG tablet Take 1 tablet (15 mg total) by mouth daily.   mupirocin ointment  (BACTROBAN) 2 % Apply 1 Application topically 2 (two) times daily.   tadalafil (CIALIS) 5 MG tablet Take 1-4 tablets (5-20 mg total) by mouth daily as needed for erectile dysfunction.     Review of Systems  Constitutional:  Negative for fatigue and fever.  Respiratory:  Negative for chest tightness and shortness of breath.   Cardiovascular:  Negative for chest pain and palpitations.  Gastrointestinal:  Negative for abdominal pain.  Skin:  Positive for rash and wound.    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   Venous stasis dermatitis of both lower extremities 07/15/2022   Elevated bilirubin 04/01/2020   Kidney lesion, native, left 12/31/2019   Occult blood positive stool    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   Macrocytic anemia 08/15/2019   Weight loss 08/15/2019   Elevated ferritin 08/15/2019   Erectile dysfunction 07/25/2019   Class 2 severe obesity due to excess calories with serious comorbidity and body mass index (BMI) of 36.0 to 36.9 in adult [facility]) 07/19/2019   History  of adenomatous polyp of colon    Epigastric hernia 07/18/2014    Allergies  Allergen Reactions   Amoxicillin-Pot Clavulanate Hives and Nausea And Vomiting    Immunization History  Administered Date(s) Administered   Fluad Quad(high Dose 65+) 11/14/2018, 11/21/2019   Influenza Split 11/25/2021   Influenza, High Dose Seasonal PF 12/14/2016, 12/15/2017   Influenza,inj,Quad PF,6+ Mos 10/22/2015   Moderna Sars-Covid-2 Vaccination  01/04/2020   PFIZER(Purple Top)SARS-COV-2 Vaccination 03/08/2019, 03/29/2019   Pneumococcal Conjugate-13 12/14/2016   Pneumococcal Polysaccharide-23 08/30/2014   Tdap 08/30/2014    Past Surgical History:  Procedure Laterality Date   CATARACT EXTRACTION W/PHACO Right 03/09/2017   Procedure: CATARACT EXTRACTION PHACO AND INTRAOCULAR LENS PLACEMENT (IOC) RIGHT;  Surgeon: Nevada Crane, MD;  Location: [facility];  Service: Ophthalmology;  Laterality: Right;   CATARACT EXTRACTION W/PHACO Left 08/25/2021   Procedure: CATARACT EXTRACTION PHACO AND INTRAOCULAR LENS PLACEMENT (IOC) LEFT;  Surgeon: Nevada Crane, MD;  Location: [facility];  Service: Ophthalmology;  Laterality: Left;   CATARACT EXTRACTION W/PHACO Left 10/13/2021   Procedure: CATARACT EXTRACTION PHACO AND INTRAOCULAR LENS PLACEMENT (IOC) LEFT;  Surgeon: Nevada Crane, MD;  Location: [facility];  Service: Ophthalmology;  Laterality: Left;  8.12 0:52.0   COLONOSCOPY  2013   normal- cleared for 5 yrs- Dr Theodoro Parma   COLONOSCOPY WITH PROPOFOL N/A 02/17/2017   Procedure: COLONOSCOPY WITH PROPOFOL;  Surgeon: Toney Reil, MD;  Location: [facility];  Service: Endoscopy;  Laterality: N/A;   COLONOSCOPY WITH PROPOFOL N/A 10/12/2019   Procedure: COLONOSCOPY WITH BIOPSY;  Surgeon: Toney Reil, MD;  Location: [facility];  Service: Endoscopy;  Laterality: N/A;   EPIGASTRIC HERNIA REPAIR N/A 07/06/2014   Procedure: HERNIA REPAIR EPIGASTRIC ADULT;  Surgeon: Duwaine Maxin, MD;  Location: [facility];  Service: General;  Laterality: N/A;   HERNIA REPAIR Bilateral    INSERTION OF MESH N/A 07/06/2014   Procedure: INSERTION OF MESH;  Surgeon: Duwaine Maxin, MD;  Location: [facility] OR[facility];  Service: General;  Laterality: N/A;   IR RADIOLOGIST EVAL & MGMT  11/07/2019   IR RADIOLOGIST EVAL & MGMT  04/25/2020   IR RADIOLOGIST EVAL & MGMT  12/05/2020   IR RADIOLOGIST EVAL & MGMT  06/30/2021    POLYPECTOMY  02/17/2017   Procedure: POLYPECTOMY;  Surgeon: Toney Reil, MD;  Location: [facility];  Service: Endoscopy;;   POLYPECTOMY N/A 10/12/2019   Procedure: POLYPECTOMY;  Surgeon: Toney Reil, MD;  Location: [facility];  Service: Endoscopy;  Laterality: N/A;   SHOULDER ARTHROSCOPY Left 2008   SINUS EXPLORATION      Social History   Tobacco Use   Smoking status: Former    Current packs/day: 0.00    Average packs/day: 0.3 packs/day for 50.0 years (12.5 ttl pk-yrs)    Types: Cigarettes    Start date: 05/02/1968    Quit date: 05/03/2018    Years since quitting: 4.5   Smokeless tobacco: Former   Tobacco comments:    pt quit smoking  Vaping Use   Vaping status: Never Used  Substance Use Topics   Alcohol use: Yes    Comment: Occasional -1x/month   Drug use: No    Family History  Problem Relation Age of Onset   Diabetes Father    Heart disease Father 65   Stroke Mother         11/18/2022    9:31 AM 07/31/2022    7:54 AM 07/21/2022    1:29  PM 07/15/2022   10:14 AM  GAD 7 : Generalized Anxiety Score  Nervous, Anxious, on Edge 0 0 0 0  Control/stop worrying 0 0 0 0  Worry too much - different things 0 0 0 0  Trouble relaxing 0 0 0 0  Restless 0 0 0 0  Easily annoyed or irritable 0 0 0 0  Afraid - awful might happen 0 0 0 0  Total GAD 7 Score 0 0 0 0  Anxiety Difficulty Not difficult at all Not difficult at all Not difficult at all Not difficult at all       11/18/2022    9:31 AM 07/31/2022    7:54 AM 07/21/2022    1:29 PM  Depression screen PHQ 2/9  Decreased Interest 0 0 0  Down, Depressed, Hopeless 0 0 0  PHQ - 2 Score 0 0 0  Altered sleeping 0 0 0  Tired, decreased energy 0 0 0  Change in appetite 0 0 0  Feeling bad or failure about yourself  0 0 0  Trouble concentrating 0 0 0  Moving slowly or fidgety/restless 0 0 0  Suicidal thoughts 0 0 0  PHQ-9 Score 0 0 0  Difficult doing work/chores Not difficult at all Not difficult at  all Not difficult at all    BP Readings from Last 3 Encounters:  11/18/22 116/72  11/11/22 139/78  10/28/22 125/65    Wt Readings from Last 3 Encounters:  11/18/22 247 lb (112 kg)  11/11/22 253 lb 4.8 oz (114.9 kg)  10/14/22 250 lb 3.2 oz (113.5 kg)    BP 116/72 (BP Location: Left Arm, Patient Position: Sitting, Cuff Size: Normal)   Pulse (!) 56   Temp 97.7 F (36.5 C) (Oral)   Ht 5\' 10"  (1.778 m)   Wt 247 lb (112 kg)   SpO2 98%   BMI 35.44 kg/m   Physical Exam Vitals and nursing note reviewed.  Constitutional:      Appearance: Normal appearance.  Cardiovascular:     Rate and Rhythm: Normal rate.  Pulmonary:     Effort: Pulmonary effort is normal.  Abdominal:     General: There is no distension.  Musculoskeletal:        General: Normal range of motion.  Skin:    General: Skin is warm and dry.     Findings: Erythema, rash and wound present. Rash is crusting and scaling.     Comments: Examination of RLE significant for erythema and warmth extending roughly halfway up the tibia, mildly tender to palpation.  No pitting edema.  There are small shallow wounds/ulcers of the right foot (one on dorsolateral surface and the other on the medial aspect) with localized golden crusting/scaling, likely representing the source of infection.  No pitting edema, full AROM of the ankle but with some anterior ankle pain at the extremes of flexion and extension.  Gait is normal. Second opinion by Dr. Joseph Berkshire, MD today who concurs.   Neurological:     Mental Status: He is alert and oriented to person, place, and time.     Gait: Gait is intact.  Psychiatric:        Mood and Affect: Mood and affect normal.      Recent Labs     Component Value Date/Time   NA 136 11/11/2022 1103   NA 138 07/21/2022 1412   K 4.7 11/11/2022 1103   CL 105 11/11/2022 1103   CO2 26 11/11/2022 1103   GLUCOSE  113 (H) 11/11/2022 1103   BUN 15 11/11/2022 1103   BUN 11 07/21/2022 1412   CREATININE  1.17 11/11/2022 1103   CALCIUM 9.1 11/11/2022 1103   PROT 6.6 11/11/2022 1103   PROT 6.0 07/21/2022 1412   ALBUMIN 3.6 11/11/2022 1103   ALBUMIN 3.7 (L) 07/21/2022 1412   AST 16 11/11/2022 1103   ALT 11 11/11/2022 1103   ALKPHOS 41 11/11/2022 1103   BILITOT 1.1 11/11/2022 1103   GFRNONAA >60 11/11/2022 1103   GFRAA 84 07/19/2019 0948    Lab Results  Component Value Date   WBC 5.1 11/11/2022   HGB 8.8 (L) 11/11/2022   HCT 28.6 (L) 11/11/2022   MCV 100.7 (H) 11/11/2022   PLT 281 11/11/2022   Lab Results  Component Value Date   HGBA1C 4.4 (L) 07/17/2022   Lab Results  Component Value Date   CHOL 79 (L) 07/31/2022   HDL 51 07/31/2022   LDLCALC 16 07/31/2022   TRIG 44 07/31/2022   CHOLHDL 1.4 01/04/2018   Lab Results  Component Value Date   TSH 1.275 08/15/2019     Assessment and Plan:  1. Cellulitis of right lower leg Begin doxycycline immediately, which he already has in his possession from previous visit.  I have verified the dose and instructions on his bottle, and verified that he has an adequate supply.  Patient carefully advised to monitor for systemic symptoms including fever, chills, malaise, vomiting, or deep joint pain given his history of sepsis; low threshold for emergency department if symptoms worsen over the next 24 to 48 hours despite antibiotic use.  - doxycycline (VIBRA-TABS) 100 MG tablet; Take 1 tablet (100 mg total) by mouth 2 (two) times daily for 7 days. Do not take with dairy. This medication INCREASES SUN SENSITIVITY so avoid direct sunlight.  Dispense: 14 tablet; Refill: 0    Return in about 5 days (around 11/23/2022) for OV f/u cellulitis.    Alvester Morin, PA-C, DMSc, Nutritionist [facility] [phone]

## 2022-11-23 ENCOUNTER — Encounter: Payer: Self-pay | Admitting: Family Medicine

## 2022-11-23 ENCOUNTER — Ambulatory Visit (INDEPENDENT_AMBULATORY_CARE_PROVIDER_SITE_OTHER): Payer: Medicare Other | Admitting: Family Medicine

## 2022-11-23 ENCOUNTER — Other Ambulatory Visit: Payer: Self-pay

## 2022-11-23 VITALS — BP 112/64 | HR 107 | Ht 70.0 in | Wt 243.0 lb

## 2022-11-23 DIAGNOSIS — L03115 Cellulitis of right lower limb: Secondary | ICD-10-CM | POA: Diagnosis not present

## 2022-11-23 DIAGNOSIS — I872 Venous insufficiency (chronic) (peripheral): Secondary | ICD-10-CM | POA: Diagnosis not present

## 2022-11-23 DIAGNOSIS — I739 Peripheral vascular disease, unspecified: Secondary | ICD-10-CM

## 2022-11-23 NOTE — Progress Notes (Signed)
Date:  11/23/2022   Name:  Brett Boone   DOB:  04-02-1945   MRN:  [id number]   Chief Complaint: Cellulitis (Feels better)  Leg Pain  The pain is present in the right ankle. The quality of the pain is described as aching. The pain is mild. Pertinent negatives include no inability to bear weight, loss of motion, loss of sensation, muscle weakness, numbness or tingling. The symptoms are aggravated by palpation. The treatment provided mild relief.    Lab Results  Component Value Date   NA 136 11/11/2022   K 4.7 11/11/2022   CO2 26 11/11/2022   GLUCOSE 113 (H) 11/11/2022   BUN 15 11/11/2022   CREATININE 1.17 11/11/2022   CALCIUM 9.1 11/11/2022   EGFR 86 07/21/2022   GFRNONAA >60 11/11/2022   Lab Results  Component Value Date   CHOL 79 (L) 07/31/2022   HDL 51 07/31/2022   LDLCALC 16 07/31/2022   TRIG 44 07/31/2022   CHOLHDL 1.4 01/04/2018   Lab Results  Component Value Date   TSH 1.275 08/15/2019   Lab Results  Component Value Date   HGBA1C 4.4 (L) 07/17/2022   Lab Results  Component Value Date   WBC 5.1 11/11/2022   HGB 8.8 (L) 11/11/2022   HCT 28.6 (L) 11/11/2022   MCV 100.7 (H) 11/11/2022   PLT 281 11/11/2022   Lab Results  Component Value Date   ALT 11 11/11/2022   AST 16 11/11/2022   GGT 25 10/14/2022   ALKPHOS 41 11/11/2022   BILITOT 1.1 11/11/2022   No results found for: "25OHVITD2", "25OHVITD3", "VD25OH"   Review of Systems  Constitutional:  Negative for chills, diaphoresis and fever.  Respiratory:  Negative for shortness of breath.   Cardiovascular:  Positive for leg swelling. Negative for chest pain.  Skin:  Positive for color change.  Neurological:  Negative for tingling, light-headedness and numbness.    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   Venous stasis dermatitis of both lower extremities 07/15/2022   Elevated bilirubin 04/01/2020   Kidney lesion, native, left 12/31/2019   Occult blood positive stool    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   Macrocytic anemia 08/15/2019   Weight loss 08/15/2019   Elevated ferritin 08/15/2019   Erectile dysfunction 07/25/2019   Class 2 severe obesity due to excess calories with serious comorbidity and body mass index (BMI) of 36.0 to 36.9 in adult [facility]) 07/19/2019   History of adenomatous polyp of colon    Epigastric hernia 07/18/2014    Allergies  Allergen Reactions   Amoxicillin-Pot Clavulanate Hives and Nausea And Vomiting    Past Surgical History:  Procedure Laterality Date   CATARACT EXTRACTION W/PHACO Right 03/09/2017   Procedure: CATARACT EXTRACTION PHACO AND INTRAOCULAR LENS PLACEMENT (IOC) RIGHT;  Surgeon: Nevada Crane, MD;  Location: [facility];  Service: Ophthalmology;  Laterality: Right;   CATARACT EXTRACTION W/PHACO Left 08/25/2021   Procedure: CATARACT EXTRACTION PHACO AND INTRAOCULAR LENS PLACEMENT (IOC) LEFT;  Surgeon: Nevada Crane, MD;  Location: [facility];  Service: Ophthalmology;  Laterality: Left;   CATARACT EXTRACTION W/PHACO Left 10/13/2021   Procedure: CATARACT EXTRACTION PHACO AND INTRAOCULAR LENS PLACEMENT (IOC) LEFT;  Surgeon: Nevada Crane, MD;  Location: [facility];  Service: Ophthalmology;  Laterality: Left;  8.12 0:52.0   COLONOSCOPY  2013   normal- cleared for 5 yrs- Dr Theodoro Parma   COLONOSCOPY WITH PROPOFOL N/A 02/17/2017   Procedure: COLONOSCOPY WITH PROPOFOL;  Surgeon: Toney Reil, MD;  Location: [facility];  Service: Endoscopy;  Laterality: N/A;   COLONOSCOPY WITH PROPOFOL N/A 10/12/2019   Procedure: COLONOSCOPY WITH BIOPSY;  Surgeon: Toney Reil, MD;  Location: [facility];  Service: Endoscopy;  Laterality: N/A;   EPIGASTRIC HERNIA REPAIR N/A 07/06/2014   Procedure: HERNIA REPAIR EPIGASTRIC ADULT;  Surgeon: Duwaine Maxin, MD;  Location: [facility] OR[facility];  Service: General;  Laterality: N/A;   HERNIA REPAIR Bilateral    INSERTION OF MESH N/A 07/06/2014   Procedure: INSERTION OF MESH;  Surgeon: Duwaine Maxin, MD;  Location: [facility] ORS;  Service: General;  Laterality: N/A;   IR RADIOLOGIST EVAL & MGMT  11/07/2019   IR RADIOLOGIST EVAL & MGMT  04/25/2020   IR RADIOLOGIST EVAL & MGMT  12/05/2020   IR RADIOLOGIST EVAL & MGMT  06/30/2021   POLYPECTOMY  02/17/2017   Procedure: POLYPECTOMY;  Surgeon: Toney Reil, MD;  Location: [facility];  Service: Endoscopy;;   POLYPECTOMY N/A 10/12/2019   Procedure: POLYPECTOMY;  Surgeon: Toney Reil, MD;  Location: [facility];  Service: Endoscopy;  Laterality: N/A;   SHOULDER ARTHROSCOPY Left 2008   SINUS EXPLORATION      Social History   Tobacco Use   Smoking status: Former    Current packs/day: 0.00    Average packs/day: 0.3 packs/day for 50.0 years (12.5 ttl pk-yrs)    Types: Cigarettes    Start date: 05/02/1968    Quit date: 05/03/2018    Years since quitting: 4.5   Smokeless tobacco: Former   Tobacco comments:    pt quit smoking  Vaping Use   Vaping status: Never Used  Substance Use Topics   Alcohol use: Yes    Comment: Occasional -1x/month   Drug use: No     Medication list has been reviewed and updated.  Current Meds  Medication Sig   acetaminophen (TYLENOL) 325 MG tablet Take 650 mg by mouth every 6 (six) hours as needed.   Calcium Carb-Cholecalciferol 600-400 MG-UNIT TABS Take by mouth.   Darbepoetin Alfa (ARANESP) 25 MCG/0.42ML SOSY injection Inject 25 mcg into the skin.   doxycycline  (VIBRA-TABS) 100 MG tablet Take 1 tablet (100 mg total) by mouth 2 (two) times daily for 7 days. Do not take with dairy. This medication INCREASES SUN SENSITIVITY so avoid direct sunlight.   meloxicam (MOBIC) 15 MG tablet Take 1 tablet (15 mg total) by mouth daily.   mupirocin ointment (BACTROBAN) 2 % Apply 1 Application topically 2 (two) times daily.   tadalafil (CIALIS) 5 MG tablet Take 1-4 tablets (5-20 mg total) by mouth daily as needed for erectile dysfunction.       11/23/2022   10:20 AM 11/18/2022    9:31 AM 07/31/2022    7:54 AM 07/21/2022    1:29 PM  GAD 7 : Generalized Anxiety Score  Nervous, Anxious, on Edge 0 0 0 0  Control/stop worrying 0 0 0 0  Worry too much - different things 0 0 0 0  Trouble relaxing 0 0  0 0  Restless 0 0 0 0  Easily annoyed or irritable 0 0 0 0  Afraid - awful might happen 0 0 0 0  Total GAD 7 Score 0 0 0 0  Anxiety Difficulty Not difficult at all Not difficult at all Not difficult at all Not difficult at all       11/23/2022   10:20 AM 11/18/2022    9:31 AM 07/31/2022    7:54 AM  Depression screen PHQ 2/9  Decreased Interest 0 0 0  Down, Depressed, Hopeless 0 0 0  PHQ - 2 Score 0 0 0  Altered sleeping 0 0 0  Tired, decreased energy 0 0 0  Change in appetite 0 0 0  Feeling bad or failure about yourself  0 0 0  Trouble concentrating 0 0 0  Moving slowly or fidgety/restless 0 0 0  Suicidal thoughts 0 0 0  PHQ-9 Score 0 0 0  Difficult doing work/chores Not difficult at all Not difficult at all Not difficult at all    BP Readings from Last 3 Encounters:  11/23/22 112/64  11/18/22 116/72  11/11/22 139/78    Physical Exam Vitals and nursing note reviewed.  HENT:     Right Ear: Tympanic membrane and ear canal normal.     Left Ear: Tympanic membrane and ear canal normal.     Mouth/Throat:     Mouth: Mucous membranes are moist.  Cardiovascular:     Heart sounds: No murmur heard.    No friction rub. No gallop.  Pulmonary:     Effort: No  respiratory distress.     Breath sounds: No stridor. No wheezing, rhonchi or rales.  Chest:     Chest wall: No tenderness.     Wt Readings from Last 3 Encounters:  11/23/22 243 lb (110.2 kg)  11/18/22 247 lb (112 kg)  11/11/22 253 lb 4.8 oz (114.9 kg)    BP 112/64   Pulse (!) 107   Ht 5\' 10"  (1.778 m)   Wt 243 lb (110.2 kg)   SpO2 100%   BMI 34.87 kg/m   Assessment and Plan:  1. Cellulitis of right lower extremity New onset.  Persistent.  Gradually improving.  Patient was seen in office last week and was treated with doxycycline 100 mg twice a day.  Patient still has some warmth and erythema of the lower leg but this has significantly improved by patient's history.  There is no tenderness noted over the area I do not appreciate a significant dorsalis pedis or posterior tibial pulse and I do have concerns that there may be contribution of PAD.  Patient does have venous insufficiency which he has been evaluated but only with compression stocking therapy.  Continue with doxycycline 100 mg twice a day and I will recheck patient on Thursday for progression of therapy/improvement.  In the meantime patient does have an upcoming appointment with [facility] for assessment of his arterial concern. - CBC with Differential/Platelet - Ambulatory referral to Vascular Surgery  2. PAD (peripheral artery disease) (HCC) As noted above patient has examination consistent with PAD and I do have concerns that this may be contributing to his cellulitis on the left and now we have cellulitis on the right lower extremity.  We will refer to [place] [facility] for formal evaluation of her arterial nature and then determine whether or not we need to have further evaluation with angiography.  In the meantime we will check a CBC  if this is of concern.  Patient does have a myelodysplastic underlying hematologic concern which may be contributing to this as well. - CBC with  Differential/Platelet - Ambulatory referral to Vascular Surgery  3. Venous stasis dermatitis of both lower extremities Followed by [facility] with suggestion for compression therapy. - CBC with Differential/Platelet - Ambulatory referral to Vascular Surgery    Elizabeth Sauer, MD

## 2022-11-24 ENCOUNTER — Ambulatory Visit (INDEPENDENT_AMBULATORY_CARE_PROVIDER_SITE_OTHER): Payer: Medicare Other | Admitting: Vascular Surgery

## 2022-11-24 ENCOUNTER — Encounter (INDEPENDENT_AMBULATORY_CARE_PROVIDER_SITE_OTHER): Payer: Self-pay | Admitting: Vascular Surgery

## 2022-11-24 ENCOUNTER — Encounter: Payer: Self-pay | Admitting: Family Medicine

## 2022-11-24 VITALS — BP 120/73 | HR 67 | Resp 18 | Ht 70.0 in | Wt 245.6 lb

## 2022-11-24 DIAGNOSIS — I8312 Varicose veins of left lower extremity with inflammation: Secondary | ICD-10-CM

## 2022-11-24 DIAGNOSIS — I8311 Varicose veins of right lower extremity with inflammation: Secondary | ICD-10-CM | POA: Diagnosis not present

## 2022-11-24 DIAGNOSIS — I890 Lymphedema, not elsewhere classified: Secondary | ICD-10-CM | POA: Insufficient documentation

## 2022-11-24 DIAGNOSIS — L03115 Cellulitis of right lower limb: Secondary | ICD-10-CM | POA: Diagnosis not present

## 2022-11-24 NOTE — Assessment & Plan Note (Signed)
Currently on antibiotics for cellulitis of the right lower extremity.  Has previously had cellulitis of the left lower extremity.  With his severe combination of venous reflux as well as severe lymphedema, this is a difficult problem that is likely to recur.  Complete his course of antibiotics.  Lymphedema pump.  He also would benefit from laser ablation of both great saphenous veins.

## 2022-11-24 NOTE — Assessment & Plan Note (Signed)
A previously performed venous reflux study showed no evidence of DVT or superficial thrombophlebitis, but great saphenous vein reflux was seen bilaterally as well as deep venous reflux.  His leg has severe hyperpigmentation on both sides with marked stasis dermatitis changes and he has clearly developed severe lymphedema as well. This is a very difficult situation.  He clearly needs laser ablation of both great saphenous veins.  He still has deep venous reflux and still needs to wear his compression socks every day as he has been for almost a year.  He would also benefit from a lymphedema pump as he is clearly developed stage III lymphedema.  We will begin the process for both of these things currently.

## 2022-11-24 NOTE — Progress Notes (Signed)
Patient ID: Brett Boone, male   DOB: 26-May-1945, 77 y.o.   MRN: [id number]  Chief Complaint  Patient presents with   New Patient (Initial Visit)    URGENT - np. consult. cellulitis of RLE. Brett, Boone    HPI [name] is a 77 y.o. male.  I am asked to see the patient by Dr. Yetta Barre for evaluation of recurrent cellulitis this time in the right lower extremity.  The patient was seen about 9 months ago for cellulitis in the left lower extremity.  He had a venous ulcer which resolved at that time.  About 2 weeks ago, he began having weeping and redness from his right foot and ankle area.  This resulted from no trauma, injury, or inciting event that he could identify.  He was started promptly on antibiotics stately by his primary care physician and that seem to resolve the tenderness and inflammation quickly.  The drainage stopped and he has some dry scaly skin at this location now.  No chest pain or shortness of breath.  No fever or chills.  He has been diligently wearing his compression socks on a daily basis.  A previously performed venous reflux study showed no evidence of DVT or superficial thrombophlebitis, but great saphenous vein reflux was seen bilaterally as well as deep venous reflux.  His leg has severe hyperpigmentation on both sides with marked stasis dermatitis changes and he has clearly developed severe lymphedema as well.   Past Medical History:  Diagnosis Date   Anemia    Heart murmur    Hydrocele    MDS (myelodysplastic syndrome) (HCC)    Rotator cuff tear arthropathy of right shoulder 08/01/2020   Venous stasis dermatitis of both lower extremities     Past Surgical History:  Procedure Laterality Date   CATARACT EXTRACTION W/PHACO Right 03/09/2017   Procedure: CATARACT EXTRACTION PHACO AND INTRAOCULAR LENS PLACEMENT (IOC) RIGHT;  Surgeon: Nevada Crane, MD;  Location: [facility];  Service: Ophthalmology;  Laterality: Right;   CATARACT  EXTRACTION W/PHACO Left 08/25/2021   Procedure: CATARACT EXTRACTION PHACO AND INTRAOCULAR LENS PLACEMENT (IOC) LEFT;  Surgeon: Nevada Crane, MD;  Location: [facility];  Service: Ophthalmology;  Laterality: Left;   CATARACT EXTRACTION W/PHACO Left 10/13/2021   Procedure: CATARACT EXTRACTION PHACO AND INTRAOCULAR LENS PLACEMENT (IOC) LEFT;  Surgeon: Nevada Crane, MD;  Location: [facility];  Service: Ophthalmology;  Laterality: Left;  8.12 0:52.0   COLONOSCOPY  2013   normal- cleared for 5 yrs- Dr Theodoro Parma   COLONOSCOPY WITH PROPOFOL N/A 02/17/2017   Procedure: COLONOSCOPY WITH PROPOFOL;  Surgeon: Toney Reil, MD;  Location: [facility];  Service: Endoscopy;  Laterality: N/A;   COLONOSCOPY WITH PROPOFOL N/A 10/12/2019   Procedure: COLONOSCOPY WITH BIOPSY;  Surgeon: Toney Reil, MD;  Location: [facility];  Service: Endoscopy;  Laterality: N/A;   EPIGASTRIC HERNIA REPAIR N/A 07/06/2014   Procedure: HERNIA REPAIR EPIGASTRIC ADULT;  Surgeon: Duwaine Maxin, MD;  Location: [facility];  Service: General;  Laterality: N/A;   HERNIA REPAIR Bilateral    INSERTION OF MESH N/A 07/06/2014   Procedure: INSERTION OF MESH;  Surgeon: Duwaine Maxin, MD;  Location: [facility];  Service: General;  Laterality: N/A;   IR RADIOLOGIST EVAL & MGMT  11/07/2019   IR RADIOLOGIST EVAL & MGMT  04/25/2020   IR RADIOLOGIST EVAL & MGMT  12/05/2020   IR RADIOLOGIST EVAL & MGMT  06/30/2021  POLYPECTOMY  02/17/2017   Procedure: POLYPECTOMY;  Surgeon: Toney Reil, MD;  Location: [facility];  Service: Endoscopy;;   POLYPECTOMY N/A 10/12/2019   Procedure: POLYPECTOMY;  Surgeon: Toney Reil, MD;  Location: [facility];  Service: Endoscopy;  Laterality: N/A;   SHOULDER ARTHROSCOPY Left 2008   SINUS EXPLORATION       Family History  Problem Relation Age of Onset   Diabetes Father    Heart disease Father 72   Stroke Mother       Social  History   Tobacco Use   Smoking status: Former    Current packs/day: 0.00    Average packs/day: 0.3 packs/day for 50.0 years (12.5 ttl pk-yrs)    Types: Cigarettes    Start date: 05/02/1968    Quit date: 05/03/2018    Years since quitting: 4.5   Smokeless tobacco: Former   Tobacco comments:    pt quit smoking  Vaping Use   Vaping status: Never Used  Substance Use Topics   Alcohol use: Yes    Comment: Occasional -1x/month   Drug use: No     Allergies  Allergen Reactions   Amoxicillin-Pot Clavulanate Hives and Nausea And Vomiting    Current Outpatient Medications  Medication Sig Dispense Refill   acetaminophen (TYLENOL) 325 MG tablet Take 650 mg by mouth every 6 (six) hours as needed.     Calcium Carb-Cholecalciferol 600-400 MG-UNIT TABS Take by mouth.     Darbepoetin Alfa (ARANESP) 25 MCG/0.42ML SOSY injection Inject 25 mcg into the skin.     doxycycline (VIBRA-TABS) 100 MG tablet Take 1 tablet (100 mg total) by mouth 2 (two) times daily for 7 days. Do not take with dairy. This medication INCREASES SUN SENSITIVITY so avoid direct sunlight. 14 tablet 0   meloxicam (MOBIC) 15 MG tablet Take 1 tablet (15 mg total) by mouth daily. 30 tablet 0   mupirocin ointment (BACTROBAN) 2 % Apply 1 Application topically 2 (two) times daily. 22 g 0   tadalafil (CIALIS) 5 MG tablet Take 1-4 tablets (5-20 mg total) by mouth daily as needed for erectile dysfunction. 90 tablet 6   No current facility-administered medications for this visit.   Facility-Administered Medications Ordered in Other Visits  Medication Dose Route Frequency Provider Last Rate Last Admin   Darbepoetin Alfa (ARANESP) injection 600 mcg  600 mcg Subcutaneous Q14 Days Creig Hines, MD   600 mcg at 03/13/22 1032   Darbepoetin Alfa (ARANESP) injection 600 mcg  600 mcg Subcutaneous Q14 Days Creig Hines, MD   600 mcg at 06/12/22 1052      REVIEW OF SYSTEMS (Negative unless checked)  Constitutional: [] Weight loss  [] Fever   [] Chills Cardiac: [] Chest pain   [] Chest pressure   [] Palpitations   [] Shortness of breath when laying flat   [] Shortness of breath at rest   [] Shortness of breath with exertion. Vascular:  [] Pain in legs with walking   [] Pain in legs at rest   [] Pain in legs when laying flat   [] Claudication   [] Pain in feet when walking  [] Pain in feet at rest  [] Pain in feet when laying flat   [] History of DVT   [] Phlebitis   [x] Swelling in legs   [] Varicose veins   [x] Non-healing ulcers Pulmonary:   [] Uses home oxygen   [] Productive cough   [] Hemoptysis   [] Wheeze  [] COPD   [] Asthma Neurologic:  [] Dizziness  [] Blackouts   [] Seizures   [] History of stroke   [] History  of TIA  [] Aphasia   [] Temporary blindness   [] Dysphagia   [] Weakness or numbness in arms   [] Weakness or numbness in legs Musculoskeletal:  [] Arthritis   [] Joint swelling   [x] Joint pain   [] Low back pain Hematologic:  [] Easy bruising  [] Easy bleeding   [] Hypercoagulable state   [x] Anemic  [] Hepatitis Gastrointestinal:  [] Blood in stool   [] Vomiting blood  [] Gastroesophageal reflux/heartburn   [] Abdominal pain Genitourinary:  [] Chronic kidney disease   [] Difficult urination  [] Frequent urination  [] Burning with urination   [] Hematuria Skin:  [] Rashes   [x] Ulcers   [x] Wounds Psychological:  [] History of anxiety   []  History of major depression.    Physical Exam BP 120/73 (BP Location: Right Arm)   Pulse 67   Resp 18   Ht 5\' 10"  (1.778 m)   Wt 245 lb 9.6 oz (111.4 kg)   BMI 35.24 kg/m  Gen:  WD/WN, NAD Head: [place]/AT, No temporalis wasting. Ear/Nose/Throat: Hearing grossly intact, nares w/o erythema or drainage, oropharynx w/o Erythema/Exudate Eyes: Conjunctiva clear, sclera non-icteric  Neck: trachea midline.  No JVD.  Pulmonary:  Good air movement, respirations not labored, no use of accessory muscles  Cardiac: RRR, no JVD Vascular:  Vessel Right Left  Radial Palpable Palpable                          PT NP NP  DP 1+ NP    Gastrointestinal:. No masses, surgical incisions, or scars. Musculoskeletal: M/S 5/5 throughout.  Extremities without ischemic changes.  No deformity or atrophy.  Marked stasis dermatitis changes with severe hyperpigmentation.  His skin has a peau d'orange appearance bilaterally.  Recent healed ulceration present on the right lateral ankle area.  1-2+ right lower extremity edema, 2-3+ left lower extremity edema. Neurologic: Sensation grossly intact in extremities.  Symmetrical.  Speech is fluent. Motor exam as listed above. Psychiatric: Judgment intact, Mood & affect appropriate for pt's clinical situation. Dermatologic: Skin changes of the lower extremities as described above    Radiology No results found.  Labs Recent Results (from the past 2160 hour(s))  PSA, total and free     Status: Abnormal   Collection Time: 09/01/22 11:11 AM  Result Value Ref Range   PSA, Free 0.80 N/A ng/mL    Comment: Roche ECLIA methodology.   PSA, Free Pct 17.4 %    Comment: (NOTE) The table below lists the probability of prostate cancer for men with non-suspicious DRE results and total PSA between 4 and 10 ng/mL, by patient age Damaris Schooner, JAMA 1998, 564:3329).                  % Free PSA       50-64 yr        65-75 yr                  0.00-10.00%        56%             55%                 10.01-15.00%        24%             35%                 15.01-20.00%        17%             23%  20.01-25.00%        10%             20%                      >25.00%         5%              9% Please note:  Catalona et al did not make specific              recommendations regarding the use of              percent free PSA for any other population              of men. Performed At: [facility] [address] [place], [place] [id number] Jolene Schimke MD YQ:[id number]    Prostate Specific Ag, Serum 4.6 (H) 0.0 - 4.0 ng/mL    Comment: (NOTE) Roche ECLIA methodology. According to  the American Urological Association, Serum PSA should decrease and remain at undetectable levels after radical prostatectomy. The AUA defines biochemical recurrence as an initial PSA value 0.2 ng/mL or greater followed by a subsequent confirmatory PSA value 0.2 ng/mL or greater. Values obtained with different assay methods or kits cannot be used interchangeably. Results cannot be interpreted as absolute evidence of the presence or absence of malignant disease.   Hemoglobin and Hematocrit (Cancer Center Only)     Status: Abnormal   Collection Time: 09/02/22 11:22 AM  Result Value Ref Range   Hemoglobin 8.8 (L) 13.0 - 17.0 g/dL   HCT 52.8 (L) 41.3 - 24.4 %    Comment: Performed at [facility], [address] [place], [place] [address]  Hemoglobin and Hematocrit (Cancer Center Only)     Status: Abnormal   Collection Time: 09/16/22 11:29 AM  Result Value Ref Range   Hemoglobin 8.5 (L) 13.0 - 17.0 g/dL   HCT 25.3 (L) 66.4 - 40.3 %    Comment: Performed at [facility], [address] [place], [place] [address]  Hemoglobin and Hematocrit (Cancer Center Only)     Status: Abnormal   Collection Time: 09/30/22 11:17 AM  Result Value Ref Range   Hemoglobin 8.1 (L) 13.0 - 17.0 g/dL   HCT 95.6 (L) 38.7 - 56.4 %    Comment: Performed at [facility], [address] [place], [place] [address]  Gamma GT     Status: None   Collection Time: 10/14/22 11:08 AM  Result Value Ref Range   GGT 25 7 - 50 U/L    Comment: Performed at [facility], [address] [place], [place] [address]  Iron and TIBC     Status: Abnormal   Collection Time: 10/14/22 11:08 AM  Result Value Ref Range   Iron 104 45 - 182 ug/dL   TIBC 660 (L) 630 - 160 ug/dL   Saturation Ratios 46 (H) 17.9 - 39.5 %   UIBC 121 ug/dL    Comment: Performed at [facility], [address] [place], [place] [address]  Ferritin     Status: Abnormal   Collection Time: 10/14/22 11:08 AM  Result Value  Ref Range   Ferritin 954 (H) 24 - 336 ng/mL    Comment: Performed at [facility], [address], [place], [place] [address]  Comprehensive metabolic panel     Status: Abnormal   Collection Time: 10/14/22 11:08 AM  Result Value Ref Range  Sodium 133 (L) 135 - 145 mmol/L   Potassium 4.3 3.5 - 5.1 mmol/L   Chloride 104 98 - 111 mmol/L   CO2 26 22 - 32 mmol/L   Glucose, Bld 120 (H) 70 - 99 mg/dL    Comment: Glucose reference range applies only to samples taken after fasting for at least 8 hours.   BUN 20 8 - 23 mg/dL   Creatinine, Ser 1.61 0.61 - 1.24 mg/dL   Calcium 8.5 (L) 8.9 - 10.3 mg/dL   Total Protein 6.7 6.5 - 8.1 g/dL   Albumin 3.6 3.5 - 5.0 g/dL   AST 15 15 - 41 U/L   ALT 11 0 - 44 U/L   Alkaline Phosphatase 38 38 - 126 U/L   Total Bilirubin 1.4 (H) 0.3 - 1.2 mg/dL   GFR, Estimated >09 >60 mL/min    Comment: (NOTE) Calculated using the CKD-EPI Creatinine Equation (2021)    Anion gap 3 (L) 5 - 15    Comment: Performed at [facility], [address] [place], [place] [address]  CBC with Differential/Platelet     Status: Abnormal   Collection Time: 10/14/22 11:08 AM  Result Value Ref Range   WBC 4.9 4.0 - 10.5 K/uL   RBC 2.68 (L) 4.22 - 5.81 MIL/uL   Hemoglobin 8.4 (L) 13.0 - 17.0 g/dL   HCT 81.1 (L) 91.4 - 78.2 %   MCV 101.5 (H) 80.0 - 100.0 fL   MCH 31.3 26.0 - 34.0 pg   MCHC 30.9 30.0 - 36.0 g/dL   RDW 95.6 (H) 21.3 - 08.6 %   Platelets 290 150 - 400 K/uL   nRBC 0.0 0.0 - 0.2 %   Neutrophils Relative % 68 %   Neutro Abs 3.3 1.7 - 7.7 K/uL   Lymphocytes Relative 20 %   Lymphs Abs 1.0 0.7 - 4.0 K/uL   Monocytes Relative 8 %   Monocytes Absolute 0.4 0.1 - 1.0 K/uL   Eosinophils Relative 3 %   Eosinophils Absolute 0.1 0.0 - 0.5 K/uL   Basophils Relative 1 %   Basophils Absolute 0.0 0.0 - 0.1 K/uL   Immature Granulocytes 0 %   Abs Immature Granulocytes 0.02 0.00 - 0.07 K/uL    Comment: Performed at [facility], [address]  [place], [place] [address]  CBC with Differential     Status: Abnormal   Collection Time: 10/28/22 11:08 AM  Result Value Ref Range   WBC 5.6 4.0 - 10.5 K/uL   RBC 2.91 (L) 4.22 - 5.81 MIL/uL   Hemoglobin 8.9 (L) 13.0 - 17.0 g/dL   HCT 96.2 (L) 95.2 - 84.1 %   MCV 100.7 (H) 80.0 - 100.0 fL   MCH 30.6 26.0 - 34.0 pg   MCHC 30.4 30.0 - 36.0 g/dL   RDW 32.4 (H) 40.1 - 02.7 %   Platelets 301 150 - 400 K/uL   nRBC 0.0 0.0 - 0.2 %   Neutrophils Relative % 69 %   Neutro Abs 3.8 1.7 - 7.7 K/uL   Lymphocytes Relative 21 %   Lymphs Abs 1.2 0.7 - 4.0 K/uL   Monocytes Relative 8 %   Monocytes Absolute 0.5 0.1 - 1.0 K/uL   Eosinophils Relative 1 %   Eosinophils Absolute 0.1 0.0 - 0.5 K/uL   Basophils Relative 1 %   Basophils Absolute 0.1 0.0 - 0.1 K/uL   Immature Granulocytes 0 %   Abs Immature Granulocytes 0.01 0.00 - 0.07 K/uL  Comment: Performed at [facility], [address] [place], [place] [address]  Ferritin     Status: Abnormal   Collection Time: 11/11/22 11:03 AM  Result Value Ref Range   Ferritin 902 (H) 24 - 336 ng/mL    Comment: Performed at [facility], [address] [place], [place] [address]  Iron and TIBC     Status: Abnormal   Collection Time: 11/11/22 11:03 AM  Result Value Ref Range   Iron 105 45 - 182 ug/dL   TIBC 308 (L) 657 - 846 ug/dL   Saturation Ratios 48 (H) 17.9 - 39.5 %   UIBC 112 ug/dL    Comment: Performed at [facility], [address] [place], [place] [address]  CBC with Differential (Cancer Center Only)     Status: Abnormal   Collection Time: 11/11/22 11:03 AM  Result Value Ref Range   WBC Count 5.1 4.0 - 10.5 K/uL   RBC 2.84 (L) 4.22 - 5.81 MIL/uL   Hemoglobin 8.8 (L) 13.0 - 17.0 g/dL   HCT 28.4 (L) 13.2 - 44.0 %   MCV 100.7 (H) 80.0 - 100.0 fL   MCH 31.0 26.0 - 34.0 pg   MCHC 30.8 30.0 - 36.0 g/dL   RDW 10.2 (H) 72.5 - 36.6 %   Platelet Count 281 150 - 400 K/uL   nRBC 0.0 0.0 - 0.2 %   Neutrophils Relative % 65 %    Neutro Abs 3.3 1.7 - 7.7 K/uL   Lymphocytes Relative 23 %   Lymphs Abs 1.2 0.7 - 4.0 K/uL   Monocytes Relative 7 %   Monocytes Absolute 0.4 0.1 - 1.0 K/uL   Eosinophils Relative 4 %   Eosinophils Absolute 0.2 0.0 - 0.5 K/uL   Basophils Relative 1 %   Basophils Absolute 0.0 0.0 - 0.1 K/uL   Immature Granulocytes 0 %   Abs Immature Granulocytes 0.01 0.00 - 0.07 K/uL    Comment: Performed at [facility], [address] [place], [place] [address]  CMP (Cancer Center only)     Status: Abnormal   Collection Time: 11/11/22 11:03 AM  Result Value Ref Range   Sodium 136 135 - 145 mmol/L   Potassium 4.7 3.5 - 5.1 mmol/L   Chloride 105 98 - 111 mmol/L   CO2 26 22 - 32 mmol/L   Glucose, Bld 113 (H) 70 - 99 mg/dL    Comment: Glucose reference range applies only to samples taken after fasting for at least 8 hours.   BUN 15 8 - 23 mg/dL   Creatinine 7.42 5.95 - 1.24 mg/dL   Calcium 9.1 8.9 - 63.8 mg/dL   Total Protein 6.6 6.5 - 8.1 g/dL   Albumin 3.6 3.5 - 5.0 g/dL   AST 16 15 - 41 U/L   ALT 11 0 - 44 U/L   Alkaline Phosphatase 41 38 - 126 U/L   Total Bilirubin 1.1 0.3 - 1.2 mg/dL   GFR, Estimated >75 >64 mL/min    Comment: (NOTE) Calculated using the CKD-EPI Creatinine Equation (2021)    Anion gap 5 5 - 15    Comment: Performed at [facility], [address], [place], [place] [address]    Assessment/Plan:  Lymphedema The patient has developed severe stage Boone lymphedema with chronic scarring and lymphatic channels from his chronic venous disease and his recurrent cellulitis.  He would clearly benefit from a lymphedema pump.  He has been diligently wearing 20 to 30 mmHg  compression socks now for almost a year.  He elevates his legs and use anti-inflammatories for discomfort.  He also has venous disease we plan to treat in the near future.  We will begin the approval process for a lymphedema pump in hopes that he can get that in the near future.  Cellulitis Currently  on antibiotics for cellulitis of the right lower extremity.  Has previously had cellulitis of the left lower extremity.  With his severe combination of venous reflux as well as severe lymphedema, this is a difficult problem that is likely to recur.  Complete his course of antibiotics.  Lymphedema pump.  He also would benefit from laser ablation of both great saphenous veins.  Varicose veins of both lower extremities with inflammation A previously performed venous reflux study showed no evidence of DVT or superficial thrombophlebitis, but great saphenous vein reflux was seen bilaterally as well as deep venous reflux.  His leg has severe hyperpigmentation on both sides with marked stasis dermatitis changes and he has clearly developed severe lymphedema as well. This is a very difficult situation.  He clearly needs laser ablation of both great saphenous veins.  He still has deep venous reflux and still needs to wear his compression socks every day as he has been for almost a year.  He would also benefit from a lymphedema pump as he is clearly developed stage Boone lymphedema.  We will begin the process for both of these things currently.      Festus Barren 11/24/2022, 11:21 AM   This note was created with Dragon medical transcription system.  Any errors from dictation are unintentional.

## 2022-11-24 NOTE — Assessment & Plan Note (Signed)
The patient has developed severe stage III lymphedema with chronic scarring and lymphatic channels from his chronic venous disease and his recurrent cellulitis.  He would clearly benefit from a lymphedema pump.  He has been diligently wearing 20 to 30 mmHg compression socks now for almost a year.  He elevates his legs and use anti-inflammatories for discomfort.  He also has venous disease we plan to treat in the near future.  We will begin the approval process for a lymphedema pump in hopes that he can get that in the near future.

## 2022-11-25 ENCOUNTER — Inpatient Hospital Stay: Payer: Medicare Other

## 2022-11-25 VITALS — BP 130/80

## 2022-11-25 DIAGNOSIS — D649 Anemia, unspecified: Secondary | ICD-10-CM

## 2022-11-25 DIAGNOSIS — D461 Refractory anemia with ring sideroblasts: Secondary | ICD-10-CM

## 2022-11-25 LAB — HEMOGLOBIN AND HEMATOCRIT (CANCER CENTER ONLY)
HCT: 28.6 % — ABNORMAL LOW (ref 39.0–52.0)
Hemoglobin: 8.9 g/dL — ABNORMAL LOW (ref 13.0–17.0)

## 2022-11-25 MED ORDER — DARBEPOETIN ALFA 300 MCG/0.6ML IJ SOSY
300.0000 ug | PREFILLED_SYRINGE | Freq: Once | INTRAMUSCULAR | Status: AC
  Administered 2022-11-25: 300 ug via SUBCUTANEOUS
  Filled 2022-11-25: qty 0.6

## 2022-11-26 ENCOUNTER — Telehealth: Payer: Self-pay | Admitting: *Deleted

## 2022-11-26 ENCOUNTER — Encounter: Payer: Self-pay | Admitting: *Deleted

## 2022-11-26 NOTE — Telephone Encounter (Signed)
Pt was here in office and ran in to me and he wanted to see about the grant that he was told  he had it and he wanted to see if the grant is helping him and how much has been used. I spoke to [employer]	[place] and I spoke to her about grant. She states that she as 4 people that does the she grant and told me to send her email her she will tell the staff to call him. I sent the email yest. And I called the pt. About the above also. He is ok with that

## 2022-12-01 ENCOUNTER — Encounter: Payer: Self-pay | Admitting: Family Medicine

## 2022-12-09 ENCOUNTER — Inpatient Hospital Stay: Payer: Medicare Other | Attending: Nurse Practitioner

## 2022-12-09 ENCOUNTER — Inpatient Hospital Stay: Payer: Medicare Other

## 2022-12-09 VITALS — BP 120/68

## 2022-12-09 DIAGNOSIS — Z79899 Other long term (current) drug therapy: Secondary | ICD-10-CM

## 2022-12-09 DIAGNOSIS — D461 Refractory anemia with ring sideroblasts: Secondary | ICD-10-CM

## 2022-12-09 LAB — HEMOGLOBIN AND HEMATOCRIT (CANCER CENTER ONLY)
HCT: 24.9 % — ABNORMAL LOW (ref 39.0–52.0)
Hemoglobin: 8 g/dL — ABNORMAL LOW (ref 13.0–17.0)

## 2022-12-09 MED ORDER — DARBEPOETIN ALFA 300 MCG/0.6ML IJ SOSY
300.0000 ug | PREFILLED_SYRINGE | Freq: Once | INTRAMUSCULAR | Status: AC
  Administered 2022-12-09: 300 ug via SUBCUTANEOUS
  Filled 2022-12-09: qty 0.6

## 2022-12-23 ENCOUNTER — Inpatient Hospital Stay: Payer: Medicare Other

## 2022-12-23 VITALS — BP 120/61

## 2022-12-23 DIAGNOSIS — D461 Refractory anemia with ring sideroblasts: Secondary | ICD-10-CM

## 2022-12-23 DIAGNOSIS — D649 Anemia, unspecified: Secondary | ICD-10-CM

## 2022-12-23 LAB — HEMOGLOBIN AND HEMATOCRIT (CANCER CENTER ONLY)
HCT: 27.1 % — ABNORMAL LOW (ref 39.0–52.0)
Hemoglobin: 8.5 g/dL — ABNORMAL LOW (ref 13.0–17.0)

## 2022-12-23 MED ORDER — DARBEPOETIN ALFA 300 MCG/0.6ML IJ SOSY
300.0000 ug | PREFILLED_SYRINGE | Freq: Once | INTRAMUSCULAR | Status: AC
Start: 2022-12-23 — End: 2022-12-23
  Administered 2022-12-23: 300 ug via SUBCUTANEOUS
  Filled 2022-12-23: qty 0.6

## 2023-01-06 ENCOUNTER — Inpatient Hospital Stay: Payer: Medicare Other

## 2023-01-06 ENCOUNTER — Inpatient Hospital Stay: Payer: Medicare Other | Attending: Nurse Practitioner

## 2023-01-06 VITALS — BP 142/72

## 2023-01-06 DIAGNOSIS — D649 Anemia, unspecified: Secondary | ICD-10-CM

## 2023-01-06 DIAGNOSIS — D461 Refractory anemia with ring sideroblasts: Secondary | ICD-10-CM | POA: Insufficient documentation

## 2023-01-06 LAB — HEMOGLOBIN AND HEMATOCRIT (CANCER CENTER ONLY)
HCT: 25.7 % — ABNORMAL LOW (ref 39.0–52.0)
Hemoglobin: 8.1 g/dL — ABNORMAL LOW (ref 13.0–17.0)

## 2023-01-06 MED ORDER — DARBEPOETIN ALFA 300 MCG/0.6ML IJ SOSY
300.0000 ug | PREFILLED_SYRINGE | Freq: Once | INTRAMUSCULAR | Status: AC
Start: 2023-01-06 — End: 2023-01-06
  Administered 2023-01-06: 300 ug via SUBCUTANEOUS
  Filled 2023-01-06: qty 0.6

## 2023-01-21 ENCOUNTER — Inpatient Hospital Stay: Payer: Medicare Other

## 2023-01-21 VITALS — BP 117/65

## 2023-01-21 DIAGNOSIS — D649 Anemia, unspecified: Secondary | ICD-10-CM

## 2023-01-21 DIAGNOSIS — D461 Refractory anemia with ring sideroblasts: Secondary | ICD-10-CM

## 2023-01-21 LAB — HEMOGLOBIN AND HEMATOCRIT (CANCER CENTER ONLY)
HCT: 25.7 % — ABNORMAL LOW (ref 39.0–52.0)
Hemoglobin: 8.1 g/dL — ABNORMAL LOW (ref 13.0–17.0)

## 2023-01-21 MED ORDER — DARBEPOETIN ALFA 300 MCG/0.6ML IJ SOSY
300.0000 ug | PREFILLED_SYRINGE | Freq: Once | INTRAMUSCULAR | Status: AC
  Administered 2023-01-21: 300 ug via SUBCUTANEOUS
  Filled 2023-01-21: qty 0.6

## 2023-02-04 ENCOUNTER — Inpatient Hospital Stay: Payer: Medicare Other

## 2023-02-04 ENCOUNTER — Inpatient Hospital Stay: Payer: Medicare Other | Attending: Nurse Practitioner

## 2023-02-04 VITALS — BP 137/64

## 2023-02-04 DIAGNOSIS — D461 Refractory anemia with ring sideroblasts: Secondary | ICD-10-CM

## 2023-02-04 DIAGNOSIS — D469 Myelodysplastic syndrome, unspecified: Secondary | ICD-10-CM

## 2023-02-04 LAB — CBC WITH DIFFERENTIAL (CANCER CENTER ONLY)
Abs Immature Granulocytes: 0.03 10*3/uL (ref 0.00–0.07)
Basophils Absolute: 0 10*3/uL (ref 0.0–0.1)
Basophils Relative: 0 %
Eosinophils Absolute: 0.1 10*3/uL (ref 0.0–0.5)
Eosinophils Relative: 2 %
HCT: 27.4 % — ABNORMAL LOW (ref 39.0–52.0)
Hemoglobin: 8.8 g/dL — ABNORMAL LOW (ref 13.0–17.0)
Immature Granulocytes: 0 %
Lymphocytes Relative: 17 %
Lymphs Abs: 1.2 10*3/uL (ref 0.7–4.0)
MCH: 33.5 pg (ref 26.0–34.0)
MCHC: 32.1 g/dL (ref 30.0–36.0)
MCV: 104.2 fL — ABNORMAL HIGH (ref 80.0–100.0)
Monocytes Absolute: 0.7 10*3/uL (ref 0.1–1.0)
Monocytes Relative: 9 %
Neutro Abs: 5.1 10*3/uL (ref 1.7–7.7)
Neutrophils Relative %: 72 %
Platelet Count: 310 10*3/uL (ref 150–400)
RBC: 2.63 MIL/uL — ABNORMAL LOW (ref 4.22–5.81)
RDW: 25 % — ABNORMAL HIGH (ref 11.5–15.5)
WBC Count: 7.1 10*3/uL (ref 4.0–10.5)
nRBC: 0.6 % — ABNORMAL HIGH (ref 0.0–0.2)

## 2023-02-04 LAB — CMP (CANCER CENTER ONLY)
ALT: 15 U/L (ref 0–44)
AST: 21 U/L (ref 15–41)
Albumin: 4.1 g/dL (ref 3.5–5.0)
Alkaline Phosphatase: 43 U/L (ref 38–126)
Anion gap: 8 (ref 5–15)
BUN: 21 mg/dL (ref 8–23)
CO2: 25 mmol/L (ref 22–32)
Calcium: 8.9 mg/dL (ref 8.9–10.3)
Chloride: 104 mmol/L (ref 98–111)
Creatinine: 1.11 mg/dL (ref 0.61–1.24)
GFR, Estimated: >60 mL/min (ref >60)
Glucose, Bld: 111 mg/dL — ABNORMAL HIGH (ref 70–99)
Potassium: 4.4 mmol/L (ref 3.5–5.1)
Sodium: 137 mmol/L (ref 135–145)
Total Bilirubin: 1.8 mg/dL — ABNORMAL HIGH (ref 0.0–1.2)
Total Protein: 6.8 g/dL (ref 6.5–8.1)

## 2023-02-04 MED ORDER — DARBEPOETIN ALFA 300 MCG/0.6ML IJ SOSY
300.0000 ug | PREFILLED_SYRINGE | Freq: Once | INTRAMUSCULAR | Status: AC
  Administered 2023-02-04: 300 ug via SUBCUTANEOUS
  Filled 2023-02-04: qty 0.6

## 2023-02-16 ENCOUNTER — Ambulatory Visit: Payer: Medicare Other | Admitting: Family Medicine

## 2023-02-16 ENCOUNTER — Telehealth (INDEPENDENT_AMBULATORY_CARE_PROVIDER_SITE_OTHER): Payer: Self-pay | Admitting: Vascular Surgery

## 2023-02-16 NOTE — Telephone Encounter (Signed)
LVM on patient's cell phone TCB and schedule laser ablation appt. Patient's home # went straight to VM with no option to LM.

## 2023-02-17 ENCOUNTER — Other Ambulatory Visit: Payer: Self-pay

## 2023-02-17 DIAGNOSIS — D469 Myelodysplastic syndrome, unspecified: Secondary | ICD-10-CM

## 2023-02-19 ENCOUNTER — Other Ambulatory Visit: Payer: Self-pay

## 2023-02-19 ENCOUNTER — Encounter: Payer: Self-pay | Admitting: Oncology

## 2023-02-19 ENCOUNTER — Inpatient Hospital Stay (HOSPITAL_BASED_OUTPATIENT_CLINIC_OR_DEPARTMENT_OTHER): Payer: Medicare Other | Admitting: Oncology

## 2023-02-19 ENCOUNTER — Inpatient Hospital Stay: Payer: Medicare Other

## 2023-02-19 VITALS — BP 118/62 | HR 86 | Temp 97.8°F | Resp 18 | Wt 251.2 lb

## 2023-02-19 DIAGNOSIS — D461 Refractory anemia with ring sideroblasts: Secondary | ICD-10-CM

## 2023-02-19 DIAGNOSIS — D469 Myelodysplastic syndrome, unspecified: Secondary | ICD-10-CM

## 2023-02-19 DIAGNOSIS — Z79899 Other long term (current) drug therapy: Secondary | ICD-10-CM

## 2023-02-19 LAB — CMP (CANCER CENTER ONLY)
ALT: 22 U/L (ref 0–44)
AST: 25 U/L (ref 15–41)
Albumin: 3.9 g/dL (ref 3.5–5.0)
Alkaline Phosphatase: 38 U/L (ref 38–126)
Anion gap: 8 (ref 5–15)
BUN: 25 mg/dL — ABNORMAL HIGH (ref 8–23)
CO2: 24 mmol/L (ref 22–32)
Calcium: 8.8 mg/dL — ABNORMAL LOW (ref 8.9–10.3)
Chloride: 104 mmol/L (ref 98–111)
Creatinine: 1.06 mg/dL (ref 0.61–1.24)
GFR, Estimated: >60 mL/min (ref >60)
Glucose, Bld: 150 mg/dL — ABNORMAL HIGH (ref 70–99)
Potassium: 4.4 mmol/L (ref 3.5–5.1)
Sodium: 136 mmol/L (ref 135–145)
Total Bilirubin: 1.4 mg/dL — ABNORMAL HIGH (ref 0.0–1.2)
Total Protein: 6.2 g/dL — ABNORMAL LOW (ref 6.5–8.1)

## 2023-02-19 LAB — CBC WITH DIFFERENTIAL (CANCER CENTER ONLY)
Abs Immature Granulocytes: 0.02 10*3/uL (ref 0.00–0.07)
Basophils Absolute: 0 10*3/uL (ref 0.0–0.1)
Basophils Relative: 1 %
Eosinophils Absolute: 0.1 10*3/uL (ref 0.0–0.5)
Eosinophils Relative: 1 %
HCT: 26.6 % — ABNORMAL LOW (ref 39.0–52.0)
Hemoglobin: 8.6 g/dL — ABNORMAL LOW (ref 13.0–17.0)
Immature Granulocytes: 0 %
Lymphocytes Relative: 22 %
Lymphs Abs: 1.3 10*3/uL (ref 0.7–4.0)
MCH: 33.1 pg (ref 26.0–34.0)
MCHC: 32.3 g/dL (ref 30.0–36.0)
MCV: 102.3 fL — ABNORMAL HIGH (ref 80.0–100.0)
Monocytes Absolute: 0.5 10*3/uL (ref 0.1–1.0)
Monocytes Relative: 8 %
Neutro Abs: 3.8 10*3/uL (ref 1.7–7.7)
Neutrophils Relative %: 68 %
Platelet Count: 289 10*3/uL (ref 150–400)
RBC: 2.6 MIL/uL — ABNORMAL LOW (ref 4.22–5.81)
RDW: 24.6 % — ABNORMAL HIGH (ref 11.5–15.5)
WBC Count: 5.7 10*3/uL (ref 4.0–10.5)
nRBC: 0.5 % — ABNORMAL HIGH (ref 0.0–0.2)

## 2023-02-19 LAB — FERRITIN: Ferritin: 1185 ng/mL — ABNORMAL HIGH (ref 24–336)

## 2023-02-19 MED ORDER — DARBEPOETIN ALFA 300 MCG/0.6ML IJ SOSY
300.0000 ug | PREFILLED_SYRINGE | Freq: Once | INTRAMUSCULAR | Status: AC
Start: 2023-02-19 — End: 2023-02-19
  Administered 2023-02-19: 300 ug via SUBCUTANEOUS
  Filled 2023-02-19: qty 0.6

## 2023-02-19 NOTE — Progress Notes (Signed)
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 Bone marrow biopsy on 09/11/2019 revealed macrocytic anemia and hypercellular bone marrow for age with dyspoietic changes primarily involving the erythroid cell lines associated with abundant ring sideroblasts (> 15%).  There was no increase in blasts. Flow cytometry was negative. Cytogenetics revealed 45,X,-Y [20].  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 IPSS score is low.   Baseline EPO level was 63.3 in August 2021.  Patient received Aranesp for a month in September 2021 and was then switched to Retacrit.  He was again switched to aranesp   As per insurance preference patient found to have elevated ferritin and when we was under the care of Dr. 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   MRI abdomen in September 2021 showed 1.3 cm lesion in the left kidney concerning for renal cell carcinoma for which he follows up with urology    Interval history-patient enjoys a decent quality of life despite her hemoglobin that has remained around 8.5.  He is able to carry on his ADLs and IADLs without any significant limitations.  ECOG PS- 1 Pain scale- 0   Review of systems- Review of Systems  Constitutional:  Negative for chills, fever, malaise/fatigue and weight loss.  HENT:  Negative for congestion, ear discharge and  nosebleeds.   Eyes:  Negative for blurred vision.  Respiratory:  Negative for cough, hemoptysis, sputum production, shortness of breath and wheezing.   Cardiovascular:  Negative for chest pain, palpitations, orthopnea and claudication.  Gastrointestinal:  Negative for abdominal pain, blood in stool, constipation, diarrhea, heartburn, melena, nausea and vomiting.  Genitourinary:  Negative for dysuria, flank pain, frequency, hematuria and urgency.  Musculoskeletal:  Negative for back pain, joint pain and myalgias.  Skin:  Negative for rash.  Neurological:  Negative for dizziness, tingling, focal weakness, seizures, weakness and headaches.  Endo/Heme/Allergies:  Does not bruise/bleed easily.  Psychiatric/Behavioral:  Negative for depression and suicidal ideas. The patient does not have insomnia.       Allergies  Allergen Reactions   Amoxicillin-Pot Clavulanate Hives and Nausea And Vomiting     Past Medical History:  Diagnosis Date   Anemia    Heart murmur    Hydrocele    MDS (myelodysplastic syndrome) (HCC)    Rotator cuff tear arthropathy of right shoulder 08/01/2020   Venous stasis dermatitis of both lower extremities      Past Surgical History:  Procedure Laterality Date   CATARACT EXTRACTION W/PHACO Right 03/09/2017   Procedure: CATARACT EXTRACTION PHACO AND INTRAOCULAR LENS PLACEMENT (IOC) RIGHT;  Surgeon: Nevada Crane, MD;  Location: [facility];  Service: Ophthalmology;  Laterality: Right;   CATARACT EXTRACTION W/PHACO Left 08/25/2021   Procedure: CATARACT EXTRACTION PHACO AND INTRAOCULAR LENS PLACEMENT (IOC) LEFT;  Surgeon: Nevada Crane, MD;  Location: [facility];  Service: Ophthalmology;  Laterality: Left;   CATARACT EXTRACTION W/PHACO Left 10/13/2021  Procedure: CATARACT EXTRACTION PHACO AND INTRAOCULAR LENS PLACEMENT (IOC) LEFT;  Surgeon: Nevada Crane, MD;  Location: [facility];  Service: Ophthalmology;  Laterality: Left;   8.12 0:52.0   COLONOSCOPY  2013   normal- cleared for 5 yrs- Dr Theodoro Parma   COLONOSCOPY WITH PROPOFOL N/A 02/17/2017   Procedure: COLONOSCOPY WITH PROPOFOL;  Surgeon: Toney Reil, MD;  Location: [facility];  Service: Endoscopy;  Laterality: N/A;   COLONOSCOPY WITH PROPOFOL N/A 10/12/2019   Procedure: COLONOSCOPY WITH BIOPSY;  Surgeon: Toney Reil, MD;  Location: [facility];  Service: Endoscopy;  Laterality: N/A;   EPIGASTRIC HERNIA REPAIR N/A 07/06/2014   Procedure: HERNIA REPAIR EPIGASTRIC ADULT;  Surgeon: Duwaine Maxin, MD;  Location: [facility];  Service: General;  Laterality: N/A;   HERNIA REPAIR Bilateral    INSERTION OF MESH N/A 07/06/2014   Procedure: INSERTION OF MESH;  Surgeon: Duwaine Maxin, MD;  Location: [facility];  Service: General;  Laterality: N/A;   IR RADIOLOGIST EVAL & MGMT  11/07/2019   IR RADIOLOGIST EVAL & MGMT  04/25/2020   IR RADIOLOGIST EVAL & MGMT  12/05/2020   IR RADIOLOGIST EVAL & MGMT  06/30/2021   POLYPECTOMY  02/17/2017   Procedure: POLYPECTOMY;  Surgeon: Toney Reil, MD;  Location: [facility];  Service: Endoscopy;;   POLYPECTOMY N/A 10/12/2019   Procedure: POLYPECTOMY;  Surgeon: Toney Reil, MD;  Location: [facility];  Service: Endoscopy;  Laterality: N/A;   SHOULDER ARTHROSCOPY Left 2008   SINUS EXPLORATION      Social History   Socioeconomic History   Marital status: Married    Spouse name: Not on file   Number of children: 3   Years of education: Not on file   Highest education level: Bachelor's degree (e.g., BA, AB, BS)  Occupational History   Occupation: Retired  Tobacco Use   Smoking status: Former    Current packs/day: 0.00    Average packs/day: 0.3 packs/day for 50.0 years (12.5 ttl pk-yrs)    Types: Cigarettes    Start date: 05/02/1968    Quit date: 05/03/2018    Years since quitting: 4.8   Smokeless tobacco: Former   Tobacco comments:    pt quit smoking  Vaping Use    Vaping status: Never Used  Substance and Sexual Activity   Alcohol use: Yes    Comment: Occasional -1x/month   Drug use: No   Sexual activity: Yes  Other Topics Concern   Not on file  Social History Narrative   Not on file   Social Drivers of Health   Financial Resource Strain: Low Risk  (03/12/2022)   Overall Financial Resource Strain (CARDIA)    Difficulty of Paying Living Expenses: Not hard at all  Food Insecurity: No Food Insecurity (07/20/2022)   Hunger Vital Sign    Worried About Running Out of Food in the Last Year: Never true    Ran Out of Food in the Last Year: Never true  Transportation Needs: No Transportation Needs (07/20/2022)   PRAPARE - Administrator, Civil Service (Medical): No    Lack of Transportation (Non-Medical): No  Physical Activity: Sufficiently Active (03/12/2022)   Exercise Vital Sign    Days of Exercise per Week: 4 days    Minutes of Exercise per Session: 40 min  Stress: No Stress Concern Present (03/12/2022)   [employer] of Occupational Health - Occupational Stress Questionnaire    Feeling of Stress : Not at all  Social Connections: Moderately Integrated (03/12/2022)   Social Connection and Isolation Panel [NHANES]    Frequency of Communication with Friends and Family: More than three times a week    Frequency of Social Gatherings with Friends and Family: More than three times a week    Attends Religious Services: More than 4 times per year    Active Member of [employer] or Organizations: No    Attends Banker Meetings: Never    Marital Status: Married  Catering manager Violence: Not At Risk (07/16/2022)   Humiliation, Afraid, Rape, and Kick questionnaire    Fear of Current or Ex-Partner: No    Emotionally Abused: No    Physically Abused: No    Sexually Abused: No    Family History  Problem Relation Age of Onset   Diabetes Father    Heart disease Father 44   Stroke Mother      Current Outpatient Medications:     acetaminophen (TYLENOL) 325 MG tablet, Take 650 mg by mouth every 6 (six) hours as needed., Disp: , Rfl:    Calcium Carb-Cholecalciferol 600-400 MG-UNIT TABS, Take by mouth., Disp: , Rfl:    Darbepoetin Alfa (ARANESP) 25 MCG/0.42ML SOSY injection, Inject 25 mcg into the skin., Disp: , Rfl:    meloxicam (MOBIC) 15 MG tablet, Take 1 tablet (15 mg total) by mouth daily., Disp: 30 tablet, Rfl: 0   mupirocin ointment (BACTROBAN) 2 %, Apply 1 Application topically 2 (two) times daily., Disp: 22 g, Rfl: 0   tadalafil (CIALIS) 5 MG tablet, Take 1-4 tablets (5-20 mg total) by mouth daily as needed for erectile dysfunction., Disp: 90 tablet, Rfl: 6 No current facility-administered medications for this visit.  Facility-Administered Medications Ordered in Other Visits:    Darbepoetin Alfa (ARANESP) injection 600 mcg, 600 mcg, Subcutaneous, Q14 Days, Creig Hines, MD, 600 mcg at 03/13/22 1032   Darbepoetin Alfa (ARANESP) injection 600 mcg, 600 mcg, Subcutaneous, Q14 Days, Creig Hines, MD, 600 mcg at 06/12/22 1052  Physical exam:  Vitals:   02/19/23 1317  BP: 118/62  Pulse: 86  Resp: 18  Temp: 97.8 F (36.6 C)  TempSrc: Tympanic  SpO2: 100%  Weight: 251 lb 3.2 oz (113.9 kg)   Physical Exam Cardiovascular:     Rate and Rhythm: Normal rate and regular rhythm.     Heart sounds: Normal heart sounds.  Pulmonary:     Effort: Pulmonary effort is normal.     Breath sounds: Normal breath sounds.  Skin:    General: Skin is warm and dry.  Neurological:     Mental Status: He is alert and oriented to person, place, and time.         Latest Ref Rng & Units 02/19/2023   12:45 PM  CMP  Glucose 70 - 99 mg/dL 161   BUN 8 - 23 mg/dL 25   Creatinine 0.96 - 1.24 mg/dL 0.45   Sodium 409 - 811 mmol/L 136   Potassium 3.5 - 5.1 mmol/L 4.4   Chloride 98 - 111 mmol/L 104   CO2 22 - 32 mmol/L 24   Calcium 8.9 - 10.3 mg/dL 8.8   Total Protein 6.5 - 8.1 g/dL 6.2   Total Bilirubin 0.0 - 1.2 mg/dL 1.4    Alkaline Phos 38 - 126 U/L 38   AST 15 - 41 U/L 25   ALT 0 - 44 U/L 22       Latest Ref Rng & Units 02/19/2023   12:45 PM  CBC  WBC 4.0 - 10.5 K/uL 5.7   Hemoglobin 13.0 - 17.0 g/dL 8.6   Hematocrit 40.9 - 52.0 % 26.6   Platelets 150 - 400 K/uL 289      Assessment and plan- Patient is a 78 y.o. male with history of low risk MDS With ring sideroblasts SF 3 B1 positive currently on EPO here to discuss further management   Patient's hemoglobin has remained around 8.5 since July 2024.  We have discussed the option of switching him to Luspatercept should hemoglobin drift down to the low eights or less than 8 or his quality of life worsens.  For now we will continue with Aranesp 600 units every other week.  I will see him back in 3 months with CBC ferritin and iron studies.  Patient found to have elevated ferritin between 600-900 in the past and MRI abdomen has shown diffuse iron deposition back in May 2024 which was also seen back in June 2023.  He has single gene mutation for H63D which typically does not lead to iron overload.  Also given his anemia he cannot undergo phlebotomy.  I will consider iron chelating treatment for him should ferritin remain more than 1000.  His LFTs are presently normal   Visit Diagnosis 1. Erythropoietin (EPO) stimulating agent anemia management patient   2. Myelodysplastic syndrome (HCC)      Dr. Owens Shark, MD, MPH [facility] [phone] 02/19/2023 1:25 PM

## 2023-03-03 ENCOUNTER — Inpatient Hospital Stay: Payer: Medicare Other

## 2023-03-03 ENCOUNTER — Inpatient Hospital Stay: Payer: Medicare Other | Attending: Nurse Practitioner

## 2023-03-03 VITALS — BP 120/78

## 2023-03-03 DIAGNOSIS — D461 Refractory anemia with ring sideroblasts: Secondary | ICD-10-CM | POA: Insufficient documentation

## 2023-03-03 DIAGNOSIS — I890 Lymphedema, not elsewhere classified: Secondary | ICD-10-CM | POA: Diagnosis not present

## 2023-03-03 DIAGNOSIS — D649 Anemia, unspecified: Secondary | ICD-10-CM

## 2023-03-03 LAB — HEMOGLOBIN AND HEMATOCRIT (CANCER CENTER ONLY)
HCT: 26.9 % — ABNORMAL LOW (ref 39.0–52.0)
Hemoglobin: 8.5 g/dL — ABNORMAL LOW (ref 13.0–17.0)

## 2023-03-03 MED ORDER — DARBEPOETIN ALFA 300 MCG/0.6ML IJ SOSY
300.0000 ug | PREFILLED_SYRINGE | Freq: Once | INTRAMUSCULAR | Status: AC
Start: 2023-03-03 — End: 2023-03-03
  Administered 2023-03-03: 300 ug via SUBCUTANEOUS
  Filled 2023-03-03: qty 0.6

## 2023-03-03 MED ORDER — DARBEPOETIN ALFA 300 MCG/0.6ML IJ SOSY
300.0000 ug | PREFILLED_SYRINGE | Freq: Once | INTRAMUSCULAR | Status: AC
  Administered 2023-03-03: 300 ug via SUBCUTANEOUS
  Filled 2023-03-03: qty 0.6

## 2023-03-04 ENCOUNTER — Other Ambulatory Visit
Admission: RE | Admit: 2023-03-04 | Discharge: 2023-03-04 | Disposition: A | Discharge status: Home / Self Care | Payer: Medicare Other | Source: Home / Self Care | Attending: Family Medicine | Admitting: Family Medicine

## 2023-03-04 ENCOUNTER — Ambulatory Visit
Admission: RE | Admit: 2023-03-04 | Discharge: 2023-03-04 | Disposition: A | Discharge status: Home / Self Care | Payer: Medicare Other | Source: Ambulatory Visit | Attending: Family Medicine | Admitting: Family Medicine

## 2023-03-04 ENCOUNTER — Ambulatory Visit: Payer: Self-pay

## 2023-03-04 ENCOUNTER — Ambulatory Visit: Payer: Medicare Other | Admitting: Family Medicine

## 2023-03-04 ENCOUNTER — Ambulatory Visit
Admission: RE | Admit: 2023-03-04 | Discharge: 2023-03-04 | Disposition: A | Discharge status: Home / Self Care | Payer: Medicare Other | Attending: Family Medicine | Admitting: Family Medicine

## 2023-03-04 VITALS — BP 132/58 | HR 120 | Ht 70.0 in | Wt 255.0 lb

## 2023-03-04 DIAGNOSIS — J441 Chronic obstructive pulmonary disease with (acute) exacerbation: Secondary | ICD-10-CM | POA: Insufficient documentation

## 2023-03-04 DIAGNOSIS — I517 Cardiomegaly: Secondary | ICD-10-CM

## 2023-03-04 DIAGNOSIS — R0602 Shortness of breath: Secondary | ICD-10-CM | POA: Diagnosis not present

## 2023-03-04 LAB — RENAL FUNCTION PANEL
Albumin: 4.3 g/dL (ref 3.5–5.0)
Anion gap: 5 (ref 5–15)
BUN: 14 mg/dL (ref 8–23)
CO2: 26 mmol/L (ref 22–32)
Calcium: 8.4 mg/dL — ABNORMAL LOW (ref 8.9–10.3)
Chloride: 102 mmol/L (ref 98–111)
Creatinine, Ser: 0.94 mg/dL (ref 0.61–1.24)
GFR, Estimated: >60 mL/min (ref >60)
Glucose, Bld: 130 mg/dL — ABNORMAL HIGH (ref 70–99)
Phosphorus: 2.5 mg/dL (ref 2.5–4.6)
Potassium: 4.4 mmol/L (ref 3.5–5.1)
Sodium: 133 mmol/L — ABNORMAL LOW (ref 135–145)

## 2023-03-04 LAB — CBC WITH DIFFERENTIAL/PLATELET
Abs Immature Granulocytes: 0.02 10*3/uL (ref 0.00–0.07)
Basophils Absolute: 0 10*3/uL (ref 0.0–0.1)
Basophils Relative: 1 %
Eosinophils Absolute: 0.2 10*3/uL (ref 0.0–0.5)
Eosinophils Relative: 4 %
HCT: 26.4 % — ABNORMAL LOW (ref 39.0–52.0)
Hemoglobin: 8.4 g/dL — ABNORMAL LOW (ref 13.0–17.0)
Immature Granulocytes: 0 %
Lymphocytes Relative: 11 %
Lymphs Abs: 0.7 10*3/uL (ref 0.7–4.0)
MCH: 33.7 pg (ref 26.0–34.0)
MCHC: 31.8 g/dL (ref 30.0–36.0)
MCV: 106 fL — ABNORMAL HIGH (ref 80.0–100.0)
Monocytes Absolute: 0.7 10*3/uL (ref 0.1–1.0)
Monocytes Relative: 13 %
Neutro Abs: 4.1 10*3/uL (ref 1.7–7.7)
Neutrophils Relative %: 71 %
Platelets: 261 10*3/uL (ref 150–400)
RBC: 2.49 MIL/uL — ABNORMAL LOW (ref 4.22–5.81)
RDW: 25.2 % — ABNORMAL HIGH (ref 11.5–15.5)
WBC: 5.8 10*3/uL (ref 4.0–10.5)
nRBC: 0.5 % — ABNORMAL HIGH (ref 0.0–0.2)

## 2023-03-04 MED ORDER — BUDESONIDE-FORMOTEROL FUMARATE 80-4.5 MCG/ACT IN AERO: 2.0000 | INHALATION_SPRAY | Freq: Two times a day (BID) | RESPIRATORY_TRACT | 3 refills | Status: DC

## 2023-03-04 MED ORDER — DOXYCYCLINE HYCLATE 100 MG PO TABS: 100.0000 mg | ORAL_TABLET | Freq: Two times a day (BID) | ORAL | 0 refills | Status: DC

## 2023-03-04 MED ORDER — ALBUTEROL SULFATE HFA 108 (90 BASE) MCG/ACT IN AERS: 2.0000 | INHALATION_SPRAY | RESPIRATORY_TRACT | 2 refills | Status: DC | PRN

## 2023-03-04 MED ORDER — PREDNISONE 10 MG PO TABS: 10.0000 mg | ORAL_TABLET | Freq: Every day | ORAL | 0 refills | Status: DC

## 2023-03-04 NOTE — Telephone Encounter (Signed)
  Chief Complaint: difficulty breathing - URI Symptoms: cannot lay flat - out of breath with speaking Frequency: Last night for SOB, a few days for URI Pertinent Negatives: Patient denies  Disposition: [] ED /[] Urgent Care (no appt availability in office) / [x] Appointment(In office/virtual)/ []  [place] [facility]/ [] Home Care/ [] Refused Recommended Disposition /[] [place] [facility]/ []  Follow-up with PCP Additional Notes: Call for pt's wife. She states that pt is having wheezing - sob. Last night could not lay flat to sleep. This started as URI.   Reason for Disposition  [1] MILD difficulty breathing (e.g., minimal/no SOB at rest, SOB with walking, pulse <100) AND [2] NEW-onset or WORSE than normal  Answer Assessment - Initial Assessment Questions 1. RESPIRATORY STATUS: Describe your breathing? (e.g., wheezing, shortness of breath, unable to speak, severe coughing)      Wheezing  - SOB 2. ONSET: When did this breathing problem begin?      URI - this week 3. PATTERN Does the difficult breathing come and go, or has it been constant since it started?      constant 4. SEVERITY: How bad is your breathing? (e.g., mild, moderate, severe)    - MILD: No SOB at rest, mild SOB with walking, speaks normally in sentences, can lie down, no retractions, pulse < 100.    - MODERATE: SOB at rest, SOB with minimal exertion and prefers to sit, cannot lie down flat, speaks in phrases, mild retractions, audible wheezing, pulse 100-120.    - SEVERE: Very SOB at rest, speaks in single words, struggling to breathe, sitting hunched forward, retractions, pulse > 120      moderate 5. RECURRENT SYMPTOM: Have you had difficulty breathing before? If Yes, ask: When was the last time? and What happened that time?      no 8. CAUSE: What do you think is causing the breathing problem?      URI  Protocols used: Breathing Difficulty-A-AH

## 2023-03-04 NOTE — Progress Notes (Signed)
 Date:  03/04/2023   Name:  Brett Boone   DOB:  01-10-1946   MRN:  [id number]   Chief Complaint: URI (For the past 2 days patient has had chest congestion with a cough. Patient has been using Mucinex  and cough drops for his symptoms. He has also been wheezing. )  Shortness of Breath This is a chronic problem. The problem has been gradually worsening. The average episode lasts 3 days. Associated symptoms include orthopnea and sputum production. Pertinent negatives include no abdominal pain, chest pain, fever, hemoptysis, leg pain, leg swelling, PND or wheezing. The symptoms are aggravated by lying flat.    Lab Results  Component Value Date   NA 136 02/19/2023   K 4.4 02/19/2023   CO2 24 02/19/2023   GLUCOSE 150 (H) 02/19/2023   BUN 25 (H) 02/19/2023   CREATININE 1.06 02/19/2023   CALCIUM  8.8 (L) 02/19/2023   EGFR 86 07/21/2022   GFRNONAA >60 02/19/2023   Lab Results  Component Value Date   CHOL 79 (L) 07/31/2022   HDL 51 07/31/2022   LDLCALC 16 07/31/2022   TRIG 44 07/31/2022   CHOLHDL 1.4 01/04/2018   Lab Results  Component Value Date   TSH 1.275 08/15/2019   Lab Results  Component Value Date   HGBA1C 4.4 (L) 07/17/2022   Lab Results  Component Value Date   WBC 5.7 02/19/2023   HGB 8.5 (L) 03/03/2023   HCT 26.9 (L) 03/03/2023   MCV 102.3 (H) 02/19/2023   PLT 289 02/19/2023   Lab Results  Component Value Date   ALT 22 02/19/2023   AST 25 02/19/2023   GGT 25 10/14/2022   ALKPHOS 38 02/19/2023   BILITOT 1.4 (H) 02/19/2023   No results found for: 25OHVITD2, 25OHVITD3, VD25OH   Review of Systems  Constitutional:  Negative for chills and fever.  Eyes:  Negative for visual disturbance.  Respiratory:  Positive for sputum production and shortness of breath. Negative for hemoptysis, choking, chest tightness and wheezing.   Cardiovascular:  Positive for orthopnea. Negative for chest pain, palpitations, leg swelling and PND.  Gastrointestinal:   Negative for abdominal pain.    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   Elevated bilirubin 04/01/2020   Kidney lesion, native, left 12/31/2019   Occult blood positive stool    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   Macrocytic anemia 08/15/2019   Weight loss 08/15/2019   Elevated ferritin 08/15/2019   Erectile dysfunction 07/25/2019   Class 2 severe obesity due to excess calories with serious comorbidity and body mass index (BMI) of 36.0 to 36.9 in adult [facility]) 07/19/2019   History of adenomatous polyp of colon    Epigastric hernia 07/18/2014    Allergies  Allergen Reactions   Amoxicillin-Pot Clavulanate Hives and Nausea And Vomiting    Past Surgical History:  Procedure Laterality Date   CATARACT EXTRACTION W/PHACO Right 03/09/2017  Procedure: CATARACT EXTRACTION PHACO AND INTRAOCULAR LENS PLACEMENT (IOC) RIGHT;  Surgeon: Myrna Adine Anes, MD;  Location: [facility];  Service: Ophthalmology;  Laterality: Right;   CATARACT EXTRACTION W/PHACO Left 08/25/2021   Procedure: CATARACT EXTRACTION PHACO AND INTRAOCULAR LENS PLACEMENT (IOC) LEFT;  Surgeon: Myrna Adine Anes, MD;  Location: [facility];  Service: Ophthalmology;  Laterality: Left;   CATARACT EXTRACTION W/PHACO Left 10/13/2021   Procedure: CATARACT EXTRACTION PHACO AND  INTRAOCULAR LENS PLACEMENT (IOC) LEFT;  Surgeon: Myrna Adine Anes, MD;  Location: [facility];  Service: Ophthalmology;  Laterality: Left;  8.12 0:52.0   COLONOSCOPY  2013   normal- cleared for 5 yrs- Dr Parley   COLONOSCOPY WITH PROPOFOL  N/A 02/17/2017   Procedure: COLONOSCOPY WITH PROPOFOL ;  Surgeon: [name], MD;  Location: [facility];  Service: Endoscopy;  Laterality: N/A;   COLONOSCOPY WITH PROPOFOL  N/A 10/12/2019   Procedure: COLONOSCOPY WITH BIOPSY;  Surgeon: [name], MD;  Location: [facility];  Service: Endoscopy;  Laterality: N/A;   EPIGASTRIC HERNIA REPAIR N/A 07/06/2014   Procedure: HERNIA REPAIR EPIGASTRIC ADULT;  Surgeon: [name], MD;  Location: [facility];  Service: General;  Laterality: N/A;   HERNIA REPAIR Bilateral    INSERTION OF MESH N/A 07/06/2014   Procedure: INSERTION OF MESH;  Surgeon: [name], MD;  Location: [facility];  Service: General;  Laterality: N/A;   IR RADIOLOGIST EVAL & MGMT  11/07/2019   IR RADIOLOGIST EVAL & MGMT  04/25/2020   IR RADIOLOGIST EVAL & MGMT  12/05/2020   IR RADIOLOGIST EVAL & MGMT  06/30/2021   POLYPECTOMY  02/17/2017   Procedure: POLYPECTOMY;  Surgeon: [name], MD;  Location: [facility];  Service: Endoscopy;;   POLYPECTOMY N/A 10/12/2019   Procedure: POLYPECTOMY;  Surgeon: [name], MD;  Location: [facility];  Service: Endoscopy;  Laterality: N/A;   SHOULDER ARTHROSCOPY Left 2008   SINUS EXPLORATION      Social History   Tobacco Use   Smoking status: Former    Current packs/day: 0.00    Average packs/day: 0.3 packs/day for 50.0 years (12.5 ttl pk-yrs)    Types: Cigarettes    Start date: 05/02/1968    Quit date: 05/03/2018    Years since quitting: 4.8   Smokeless tobacco: Former   Tobacco comments:    pt quit smoking  Vaping Use   Vaping status: Never Used  Substance Use Topics   Alcohol use: Yes    Comment: Occasional -1x/month    Drug use: No     Medication list has been reviewed and updated.  Current Meds  Medication Sig   acetaminophen  (TYLENOL ) 325 MG tablet Take 650 mg by mouth every 6 (six) hours as needed.   Calcium  Carb-Cholecalciferol 600-400 MG-UNIT TABS Take by mouth.   Darbepoetin Alfa  (ARANESP ) 25 MCG/0.42ML SOSY injection Inject 25 mcg into the skin.   tadalafil  (CIALIS ) 5 MG tablet Take 1-4 tablets (5-20 mg total) by mouth daily as needed for erectile dysfunction.       03/04/2023   11:09 AM 11/23/2022   10:20 AM 11/18/2022    9:31 AM 07/31/2022    7:54 AM  GAD 7 : Generalized Anxiety Score  Nervous, Anxious, on Edge 0 0 0 0  Control/stop worrying 0 0 0 0  Worry too much - different things 0 0 0 0  Trouble relaxing 0 0 0 0  Restless 0 0 0 0  Easily annoyed or irritable  0 0 0 0  Afraid - awful might happen 0 0 0 0  Total GAD 7 Score 0 0 0 0  Anxiety Difficulty Not difficult at all Not difficult at all Not difficult at all Not difficult at all       03/04/2023   11:09 AM 11/23/2022   10:20 AM 11/18/2022    9:31 AM  Depression screen PHQ 2/9  Decreased Interest 0 0 0  Down, Depressed, Hopeless 0 0 0  PHQ - 2 Score 0 0 0  Altered sleeping 2 0 0  Tired, decreased energy 1 0 0  Change in appetite 0 0 0  Feeling bad or failure about yourself   0 0  Trouble concentrating 0 0 0  Moving slowly or fidgety/restless 0 0 0  Suicidal thoughts 0 0 0  PHQ-9 Score 3 0 0  Difficult doing work/chores Not difficult at all Not difficult at all Not difficult at all    BP Readings from Last 3 Encounters:  03/04/23 (!) 132/58  03/03/23 120/78  02/19/23 118/62    Physical Exam Vitals and nursing note reviewed.  Constitutional:      Appearance: He is well-developed.  HENT:     Head: Normocephalic and atraumatic.     Right Ear: Tympanic membrane, ear canal and external ear normal.     Left Ear: Tympanic membrane, ear canal and external ear normal.     Nose: Nose normal.     Mouth/Throat:      Mouth: Mucous membranes are moist.     Dentition: Normal dentition.  Eyes:     General: Lids are normal. No scleral icterus.    Conjunctiva/sclera: Conjunctivae normal.     Pupils: Pupils are equal, round, and reactive to light.  Neck:     Thyroid : No thyromegaly.     Vascular: No carotid bruit, hepatojugular reflux or JVD.     Trachea: No tracheal deviation.  Cardiovascular:     Rate and Rhythm: Normal rate and regular rhythm.     Heart sounds: Normal heart sounds, S1 normal and S2 normal. No murmur heard.    No systolic murmur is present.     No diastolic murmur is present.     No gallop. No S3 or S4 sounds.     Comments: Likely baseline venous insufficiency Pulmonary:     Effort: Pulmonary effort is normal.     Breath sounds: Wheezing present. No decreased breath sounds, rhonchi or rales.  Abdominal:     General: Bowel sounds are normal.     Palpations: Abdomen is soft. There is no hepatomegaly, splenomegaly or mass.     Tenderness: There is no abdominal tenderness. There is no guarding or rebound.     Hernia: There is no hernia in the left inguinal area.  Musculoskeletal:        General: Normal range of motion.     Cervical back: Normal range of motion and neck supple.     Right lower leg: 1+ Pitting Edema present.     Left lower leg: 1+ Pitting Edema present.  Lymphadenopathy:     Cervical: No cervical adenopathy.  Skin:    General: Skin is warm and dry.     Findings: No rash.  Neurological:     Mental Status: He is alert and oriented to person, place, and time.     Sensory: No sensory deficit.     Deep Tendon Reflexes: Reflexes are normal and symmetric.  Psychiatric:  Mood and Affect: Mood is not anxious or depressed.     Wt Readings from Last 3 Encounters:  03/04/23 255 lb (115.7 kg)  02/19/23 251 lb 3.2 oz (113.9 kg)  11/24/22 245 lb 9.6 oz (111.4 kg)    BP (!) 132/58   Pulse (!) 120   Ht 5' 10 (1.778 m)   Wt 255 lb (115.7 kg)   SpO2 98% Comment:  when lying down  BMI 36.59 kg/m   Assessment and Plan: 1. 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  Generally under control.  With suspected recent exacerbation over the course of the last 48 hours.  Currently is not on bronchodilation but on review of his CT scans there is noted to have emphysema.  Fortunately this has not required treatment in the past but I believe given the new onset of respiratory illness is probably tipped him over that there is some shortness of breath and that on examination there are diffuse wheezes primarily in the basilar area.  Auscultation also notes no rales rhonchi or rubs noted.  We will initiate prednisone  10 mg once a day and albuterol  inhaler 2 puffs every 4-6 hours for shortness of breath and Symbicort  80-4.52 puffs in lungs twice a day.  Will get baseline CBC prior to prednisone  initiation and chest x-ray to determine if there is a cardiopulmonary concern looking for pulmonary edema/cardiomegaly/pneumonia/pleural effusions.  Will hold on antibiotics until x-ray is completed. - albuterol  (VENTOLIN  HFA) 108 (90 Base) MCG/ACT inhaler; Inhale 2 puffs into the lungs every 4 (four) hours as needed for wheezing or shortness of breath.  Dispense: 8 g; Refill: 2 - predniSONE  (DELTASONE ) 10 MG tablet; Take 1 tablet (10 mg total) by mouth daily with breakfast.  Dispense: 30 tablet; Refill: 0 - DG Chest 2 View - budesonide -formoterol  (SYMBICORT ) 80-4.5 MCG/ACT inhaler; Inhale 2 puffs into the lungs 2 (two) times daily.  Dispense: 1 each; Refill: 3 - CBC with Differential/Platelet - Renal Function Panel  2. LVH (left ventricular hypertrophy) Patient has a history on examination by Dr. 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  There is a dilated left ventricular cavity as well.  This has not been an issue in the past but the wife brings up concerns of perceived orthopnea that he is unable to relate  supine without shortness of breath however patient's oxygenation improves with change in position and I am wondering if this is a positional dyspnea due to his COPD and that he is not stretching along that he would in a sitting or standing position.  We will obtain a chest x-ray to rule out cardiomegaly or pulmonary edema as well as pleural effusions and if so we will address accordingly.  Will also check renal function panel for anion gap and baseline COPD for leukocytosis at differential/neutrophil. - Renal Function Panel     Cathryne Molt, MD

## 2023-03-05 ENCOUNTER — Inpatient Hospital Stay: Payer: Medicare Other

## 2023-03-05 ENCOUNTER — Ambulatory Visit: Payer: Medicare Other | Admitting: Family Medicine

## 2023-03-05 ENCOUNTER — Encounter: Payer: Self-pay | Admitting: Family Medicine

## 2023-03-05 VITALS — BP 118/70 | HR 67 | Ht 70.0 in | Wt 255.0 lb

## 2023-03-05 DIAGNOSIS — J441 Chronic obstructive pulmonary disease with (acute) exacerbation: Secondary | ICD-10-CM | POA: Diagnosis not present

## 2023-03-05 DIAGNOSIS — R110 Nausea: Secondary | ICD-10-CM | POA: Diagnosis not present

## 2023-03-05 MED ORDER — FLUTICASONE-SALMETEROL 100-50 MCG/ACT IN AEPB: 1.0000 | INHALATION_SPRAY | Freq: Two times a day (BID) | RESPIRATORY_TRACT | 3 refills | Status: DC

## 2023-03-05 MED ORDER — PROCHAMBER VHC DEVI: 1.0000 | Freq: Four times a day (QID) | Status: DC

## 2023-03-05 MED ORDER — ONDANSETRON HCL 4 MG PO TABS: 4.0000 mg | ORAL_TABLET | Freq: Three times a day (TID) | ORAL | 0 refills | Status: DC | PRN

## 2023-03-05 NOTE — Addendum Note (Signed)
 Addended by: Leeann Pucker on: 03/05/2023 01:48 PM   Modules accepted: Orders

## 2023-03-05 NOTE — Progress Notes (Signed)
 Date:  03/05/2023   Name:  Brett Boone   DOB:  December 02, 1945   MRN:  [id number]   Chief Complaint: Cough  Cough This is a new problem. The current episode started in the past 7 days. The problem has been gradually improving. The cough is Productive of purulent sputum (seems postnasl). Associated symptoms include rhinorrhea, shortness of breath and wheezing. Pertinent negatives include no chest pain, chills, fever, hemoptysis, myalgias, postnasal drip or sore throat. Nothing aggravates the symptoms. He has tried a beta-agonist inhaler for the symptoms. The treatment provided moderate relief.  Shortness of Breath This is a new problem. The current episode started in the past 7 days. The problem occurs intermittently. Associated symptoms include rhinorrhea, sputum production and wheezing. Pertinent negatives include no chest pain, claudication, fever, hemoptysis, leg swelling, orthopnea, PND or sore throat. He has tried beta agonist inhalers for the symptoms. The treatment provided moderate relief.    Lab Results  Component Value Date   NA 133 (L) 03/04/2023   K 4.4 03/04/2023   CO2 26 03/04/2023   GLUCOSE 130 (H) 03/04/2023   BUN 14 03/04/2023   CREATININE 0.94 03/04/2023   CALCIUM  8.4 (L) 03/04/2023   EGFR 86 07/21/2022   GFRNONAA >60 03/04/2023   Lab Results  Component Value Date   CHOL 79 (L) 07/31/2022   HDL 51 07/31/2022   LDLCALC 16 07/31/2022   TRIG 44 07/31/2022   CHOLHDL 1.4 01/04/2018   Lab Results  Component Value Date   TSH 1.275 08/15/2019   Lab Results  Component Value Date   HGBA1C 4.4 (L) 07/17/2022   Lab Results  Component Value Date   WBC 5.8 03/04/2023   HGB 8.4 (L) 03/04/2023   HCT 26.4 (L) 03/04/2023   MCV 106.0 (H) 03/04/2023   PLT 261 03/04/2023   Lab Results  Component Value Date   ALT 22 02/19/2023   AST 25 02/19/2023   GGT 25 10/14/2022   ALKPHOS 38 02/19/2023   BILITOT 1.4 (H) 02/19/2023   No results found for: 25OHVITD2,  25OHVITD3, VD25OH   Review of Systems  Constitutional:  Negative for chills and fever.  HENT:  Positive for rhinorrhea. Negative for postnasal drip and sore throat.   Respiratory:  Positive for cough, sputum production, shortness of breath and wheezing. Negative for hemoptysis.   Cardiovascular:  Negative for chest pain, orthopnea, claudication, leg swelling and PND.  Musculoskeletal:  Negative for myalgias.    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   Elevated bilirubin 04/01/2020   Kidney lesion, native, left 12/31/2019   Occult blood positive stool    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   Macrocytic anemia 08/15/2019   Weight loss 08/15/2019   Elevated ferritin 08/15/2019   Erectile dysfunction 07/25/2019   Class 2 severe obesity due to excess calories with serious comorbidity and body mass index (BMI) of 36.0 to 36.9 in  adult [facility]) 07/19/2019   History of adenomatous polyp of colon    Epigastric hernia 07/18/2014    Allergies  Allergen Reactions   Amoxicillin-Pot Clavulanate Hives and Nausea And Vomiting    Past Surgical History:  Procedure Laterality Date   CATARACT EXTRACTION W/PHACO Right 03/09/2017   Procedure: CATARACT EXTRACTION PHACO AND INTRAOCULAR LENS PLACEMENT (IOC) RIGHT;  Surgeon: Myrna Adine Anes, MD;  Location: [facility]  SURGERY CNTR;  Service: Ophthalmology;  Laterality: Right;   CATARACT EXTRACTION W/PHACO Left 08/25/2021   Procedure: CATARACT EXTRACTION PHACO AND INTRAOCULAR LENS PLACEMENT (IOC) LEFT;  Surgeon: Myrna Adine Anes, MD;  Location: [facility] SURGERY [facility];  Service: Ophthalmology;  Laterality: Left;   CATARACT EXTRACTION W/PHACO Left 10/13/2021   Procedure: CATARACT EXTRACTION PHACO AND INTRAOCULAR LENS PLACEMENT (IOC) LEFT;  Surgeon: Myrna Adine Anes, MD;  Location: [facility] SURG[facility];  Service: Ophthalmology;  Laterality: Left;  8.12 0:52.0   COLONOSCOPY  2013   normal- cleared for 5 yrs- Dr Parley   COLONOSCOPY WITH PROPOFOL  N/A 02/17/2017   Procedure: COLONOSCOPY WITH PROPOFOL ;  Surgeon: [name], MD;  Location: [facility] SURGERY [facility];  Service: Endoscopy;  Laterality: N/A;   COLONOSCOPY WITH PROPOFOL  N/A 10/12/2019   Procedure: COLONOSCOPY WITH BIOPSY;  Surgeon: [name], MD;  Location: [facility] SURGERY [facility];  Service: Endoscopy;  Laterality: N/A;   EPIGASTRIC HERNIA REPAIR N/A 07/06/2014   Procedure: HERNIA REPAIR EPIGASTRIC ADULT;  Surgeon: [name], MD;  Location: [facility] ORS;  Service: General;  Laterality: N/A;   HERNIA REPAIR Bilateral    INSERTION OF MESH N/A 07/06/2014   Procedure: INSERTION OF MESH;  Surgeon: [name], MD;  Location: [facility] ORS;  Service: General;  Laterality: N/A;   IR RADIOLOGIST EVAL & MGMT  11/07/2019   IR RADIOLOGIST EVAL & MGMT  04/25/2020   IR RADIOLOGIST EVAL & MGMT  12/05/2020   IR RADIOLOGIST EVAL & MGMT  06/30/2021   POLYPECTOMY  02/17/2017   Procedure: POLYPECTOMY;  Surgeon: [name], MD;  Location: [facility];  Service: Endoscopy;;   POLYPECTOMY N/A 10/12/2019   Procedure: POLYPECTOMY;  Surgeon: [name], MD;  Location: [facility] SUR[facility];  Service: Endoscopy;  Laterality: N/A;   SHOULDER ARTHROSCOPY Left 2008   SINUS EXPLORATION      Social History   Tobacco Use   Smoking status: Former     Current packs/day: 0.00    Average packs/day: 0.3 packs/day for 50.0 years (12.5 ttl pk-yrs)    Types: Cigarettes    Start date: 05/02/1968    Quit date: 05/03/2018    Years since quitting: 4.8   Smokeless tobacco: Former   Tobacco comments:    pt quit smoking  Vaping Use   Vaping status: Never Used  Substance Use Topics   Alcohol use: Yes    Comment: Occasional -1x/month   Drug use: No     Medication list has been reviewed and updated.  Current Meds  Medication Sig   acetaminophen  (TYLENOL ) 325 MG tablet Take 650 mg by mouth every 6 (six) hours as needed.   albuterol  (VENTOLIN  HFA) 108 (90 Base) MCG/ACT inhaler Inhale 2 puffs into the lungs every 4 (four) hours as needed for wheezing or shortness of breath.   budesonide -formoterol  (SYMBICORT ) 80-4.5 MCG/ACT inhaler Inhale 2 puffs into the lungs 2 (two) times daily.   Calcium  Carb-Cholecalciferol 600-400 MG-UNIT TABS Take by mouth.   Darbepoetin Alfa  (ARANESP ) 25 MCG/0.42ML SOSY injection Inject 25 mcg into the skin.   doxycycline  (VIBRA -TABS)  100 MG tablet Take 1 tablet (100 mg total) by mouth 2 (two) times daily.   predniSONE  (DELTASONE ) 10 MG tablet Take 1 tablet (10 mg total) by mouth daily with breakfast.   tadalafil  (CIALIS ) 5 MG tablet Take 1-4 tablets (5-20 mg total) by mouth daily as needed for erectile dysfunction.       03/05/2023   11:00 AM 03/04/2023   11:09 AM 11/23/2022   10:20 AM 11/18/2022    9:31 AM  GAD 7 : Generalized Anxiety Score  Nervous, Anxious, on Edge 0 0 0 0  Control/stop worrying 0 0 0 0  Worry too much - different things 0 0 0 0  Trouble relaxing 0 0 0 0  Restless 0 0 0 0  Easily annoyed or irritable 0 0 0 0  Afraid - awful might happen 0 0 0 0  Total GAD 7 Score 0 0 0 0  Anxiety Difficulty Not difficult at all Not difficult at all Not difficult at all Not difficult at all       03/05/2023   11:00 AM 03/04/2023   11:09 AM 11/23/2022   10:20 AM  Depression screen PHQ 2/9  Decreased Interest 0 0  0  Down, Depressed, Hopeless 0 0 0  PHQ - 2 Score 0 0 0  Altered sleeping 2 2 0  Tired, decreased energy 1 1 0  Change in appetite 0 0 0  Feeling bad or failure about yourself  0  0  Trouble concentrating 0 0 0  Moving slowly or fidgety/restless 0 0 0  Suicidal thoughts 0 0 0  PHQ-9 Score 3 3 0  Difficult doing work/chores Not difficult at all Not difficult at all Not difficult at all    BP Readings from Last 3 Encounters:  03/05/23 118/70  03/04/23 (!) 132/58  03/03/23 120/78    Physical Exam Vitals and nursing note reviewed.  Constitutional:      Appearance: He is well-developed.  HENT:     Head: Normocephalic and atraumatic.     Right Ear: Tympanic membrane, ear canal and external ear normal.     Left Ear: Tympanic membrane, ear canal and external ear normal.     Nose: Nose normal.     Mouth/Throat:     Dentition: Normal dentition.  Eyes:     General: Lids are normal. No scleral icterus.    Conjunctiva/sclera: Conjunctivae normal.     Pupils: Pupils are equal, round, and reactive to light.  Neck:     Thyroid : No thyromegaly.     Vascular: No carotid bruit, hepatojugular reflux or JVD.     Trachea: No tracheal deviation.  Cardiovascular:     Rate and Rhythm: Normal rate and regular rhythm.     Heart sounds: Normal heart sounds, S1 normal and S2 normal. No murmur heard.    No systolic murmur is present.     No diastolic murmur is present.     No friction rub. No gallop. No S3 or S4 sounds.  Pulmonary:     Effort: Pulmonary effort is normal. No respiratory distress.     Breath sounds: No stridor. Rhonchi present. No wheezing or rales.     Comments: Some minor rhonchi noted in the bilateral bases of the lungs. Chest:     Chest wall: No tenderness.  Abdominal:     General: Bowel sounds are normal.     Palpations: Abdomen is soft. There is no hepatomegaly, splenomegaly or mass.     Tenderness:  There is no abdominal tenderness.     Hernia: There is no hernia in the  left inguinal area.  Musculoskeletal:        General: Normal range of motion.     Cervical back: Normal range of motion and neck supple.  Lymphadenopathy:     Cervical: No cervical adenopathy.  Skin:    General: Skin is warm and dry.     Findings: No rash.  Neurological:     Mental Status: He is alert and oriented to person, place, and time.     Sensory: No sensory deficit.     Deep Tendon Reflexes: Reflexes are normal and symmetric.  Psychiatric:        Mood and Affect: Mood is not anxious or depressed.     Wt Readings from Last 3 Encounters:  03/05/23 255 lb (115.7 kg)  03/04/23 255 lb (115.7 kg)  02/19/23 251 lb 3.2 oz (113.9 kg)    BP 118/70   Pulse 67   Ht 5' 10 (1.778 m)   Wt 255 lb (115.7 kg)   SpO2 98%   BMI 36.59 kg/m   Assessment and Plan:  1. COPD with acute exacerbation (HCC) (Primary) Relatively new onset.  Gradually improving.  Stable.  Patient was only able to start albuterol  because he was unable to get an AeroChamber and could not afford the Symbicort .  Insurance will pay for Advair so we will switch from Symbicort  to Advair 100-51 puff twice a day.  In addition an AeroChamber will be provided by prescription to be utilized with albuterol  inhaler.  Patient has improved on albuterol  as taking now I think we can even maximize this more so with the chamber which would also suggest more so of a pulmonary/concern likely of a COPD nature.  I have encouraged to continue with respiratory pulmonary consult for formal evaluation perhaps with pulmonary function testing. - Spacer/Aero-Holding Chambers [facility]) DEVI; 1 Inhalation by Does not apply route every 6 (six) hours.  Dispense: 1 each; Refill: DEVICE - fluticasone -salmeterol (ADVAIR) 100-50 MCG/ACT AEPB; Inhale 1 puff into the lungs 2 (two) times daily.  Dispense: 1 each; Refill: 3  2. Nausea New onset.  Persistent.  Patient has been having nausea and would like something for it we will prescribe Zofran  4  mg every 8 hours as needed for nausea. - ondansetron  (ZOFRAN ) 4 MG tablet; Take 1 tablet (4 mg total) by mouth every 8 (eight) hours as needed for nausea or vomiting.  Dispense: 20 tablet; Refill: 0    Cathryne Molt, MD

## 2023-03-11 ENCOUNTER — Telehealth: Payer: Self-pay | Admitting: Family Medicine

## 2023-03-11 NOTE — Telephone Encounter (Signed)
Copied from CRM [id number]. Topic: General - Other >> Mar 11, 2023  9:31 AM Epimenio Foot F wrote: Reason for CRM: Pt is calling in returning a call from the office.

## 2023-03-11 NOTE — Telephone Encounter (Signed)
Viewed pts chart I do not see where anyone has call the pt.  KP

## 2023-03-16 ENCOUNTER — Encounter (INDEPENDENT_AMBULATORY_CARE_PROVIDER_SITE_OTHER): Payer: Self-pay | Admitting: Vascular Surgery

## 2023-03-16 ENCOUNTER — Ambulatory Visit (INDEPENDENT_AMBULATORY_CARE_PROVIDER_SITE_OTHER): Payer: Medicare Other | Admitting: Vascular Surgery

## 2023-03-16 VITALS — BP 126/75 | HR 82 | Resp 16 | Wt 254.0 lb

## 2023-03-16 DIAGNOSIS — I8312 Varicose veins of left lower extremity with inflammation: Secondary | ICD-10-CM

## 2023-03-16 DIAGNOSIS — I8311 Varicose veins of right lower extremity with inflammation: Secondary | ICD-10-CM

## 2023-03-16 DIAGNOSIS — I890 Lymphedema, not elsewhere classified: Secondary | ICD-10-CM

## 2023-03-16 NOTE — Assessment & Plan Note (Signed)
 Marked improvement with daily use of compression socks and use of the lymphedema pump.  At this point, after discussions with the patient we are going to hold on laser ablation going forward.  He does have significant venous reflux but he also has deep venous reflux so even with laser ablation, he will need to continue all these conservative measures and he is pleased with where he is at so far.  If he begins developing worsening symptoms, laser ablation of the great saphenous veins will be considered.  Otherwise, I will plan on seeing him back in 6 months.

## 2023-03-16 NOTE — Assessment & Plan Note (Signed)
 Swelling and symptoms are much improved after getting his lymphedema pump and using this daily and continuing to wear his 20 to 30 mmHg compression socks daily.

## 2023-03-16 NOTE — Progress Notes (Signed)
 MRN : [id number]  Brett Boone is a 78 y.o. ([birth year]) male who presents with chief complaint of  Chief Complaint  Patient presents with   Follow-up    Discuss laser ablation  .  History of Present Illness: Patient returns today in follow up of leg swelling, venous insufficiency, and lymphedema.  Since his last visit, we had been in the process of approval and scheduling for laser ablation.  He has since received his lymphedema pump and between wearing his compression socks every day and using the lymphedema pump daily, his swelling is completely resolved.  His legs feel much better.  He said no further issues since his last visit.  He is very pleased with his progress.  Current Outpatient Medications  Medication Sig Dispense Refill   acetaminophen (TYLENOL) 325 MG tablet Take 650 mg by mouth every 6 (six) hours as needed.     albuterol (VENTOLIN HFA) 108 (90 Base) MCG/ACT inhaler Inhale 2 puffs into the lungs every 4 (four) hours as needed for wheezing or shortness of breath. 8 g 2   Calcium Carb-Cholecalciferol 600-400 MG-UNIT TABS Take by mouth.     Darbepoetin Alfa (ARANESP) 25 MCG/0.42ML SOSY injection Inject 25 mcg into the skin.     fluticasone-salmeterol (ADVAIR) 100-50 MCG/ACT AEPB Inhale 1 puff into the lungs 2 (two) times daily. 1 each 3   ondansetron (ZOFRAN) 4 MG tablet Take 1 tablet (4 mg total) by mouth every 8 (eight) hours as needed for nausea or vomiting. 20 tablet 0   predniSONE (DELTASONE) 10 MG tablet Take 1 tablet (10 mg total) by mouth daily with breakfast. 30 tablet 0   Spacer/Aero-Holding Chambers (PROCHAMBER [facility]) DEVI 1 Inhalation by Does not apply route every 6 (six) hours. 1 each DEVICE   tadalafil (CIALIS) 5 MG tablet Take 1-4 tablets (5-20 mg total) by mouth daily as needed for erectile dysfunction. 90 tablet 6   budesonide-formoterol (SYMBICORT) 80-4.5 MCG/ACT inhaler Inhale 2 puffs into the lungs 2 (two) times daily. (Patient not taking: Reported  on 03/16/2023) 1 each 3   doxycycline (VIBRA-TABS) 100 MG tablet Take 1 tablet (100 mg total) by mouth 2 (two) times daily. (Patient not taking: Reported on 03/16/2023) 20 tablet 0   No current facility-administered medications for this visit.   Facility-Administered Medications Ordered in Other Visits  Medication Dose Route Frequency Provider Last Rate Last Admin   Darbepoetin Alfa (ARANESP) injection 600 mcg  600 mcg Subcutaneous Q14 Days Creig Hines, MD   600 mcg at 03/13/22 1032   Darbepoetin Alfa (ARANESP) injection 600 mcg  600 mcg Subcutaneous Q14 Days Creig Hines, MD   600 mcg at 06/12/22 1052    Past Medical History:  Diagnosis Date   Anemia    Heart murmur    Hydrocele    MDS (myelodysplastic syndrome) (HCC)    Rotator cuff tear arthropathy of right shoulder 08/01/2020   Venous stasis dermatitis of both lower extremities     Past Surgical History:  Procedure Laterality Date   CATARACT EXTRACTION W/PHACO Right 03/09/2017   Procedure: CATARACT EXTRACTION PHACO AND INTRAOCULAR LENS PLACEMENT (IOC) RIGHT;  Surgeon: Nevada Crane, MD;  Location: [facility];  Service: Ophthalmology;  Laterality: Right;   CATARACT EXTRACTION W/PHACO Left 08/25/2021   Procedure: CATARACT EXTRACTION PHACO AND INTRAOCULAR LENS PLACEMENT (IOC) LEFT;  Surgeon: Nevada Crane, MD;  Location: [facility];  Service: Ophthalmology;  Laterality: Left;   CATARACT EXTRACTION W/PHACO Left 10/13/2021  Procedure: CATARACT EXTRACTION PHACO AND INTRAOCULAR LENS PLACEMENT (IOC) LEFT;  Surgeon: Nevada Crane, MD;  Location: [facility];  Service: Ophthalmology;  Laterality: Left;  8.12 0:52.0   COLONOSCOPY  2013   normal- cleared for 5 yrs- Dr Theodoro Parma   COLONOSCOPY WITH PROPOFOL N/A 02/17/2017   Procedure: COLONOSCOPY WITH PROPOFOL;  Surgeon: Toney Reil, MD;  Location: [facility];  Service: Endoscopy;  Laterality: N/A;   COLONOSCOPY WITH PROPOFOL N/A  10/12/2019   Procedure: COLONOSCOPY WITH BIOPSY;  Surgeon: Toney Reil, MD;  Location: [facility];  Service: Endoscopy;  Laterality: N/A;   EPIGASTRIC HERNIA REPAIR N/A 07/06/2014   Procedure: HERNIA REPAIR EPIGASTRIC ADULT;  Surgeon: Duwaine Maxin, MD;  Location: [facility];  Service: General;  Laterality: N/A;   HERNIA REPAIR Bilateral    INSERTION OF MESH N/A 07/06/2014   Procedure: INSERTION OF MESH;  Surgeon: Duwaine Maxin, MD;  Location: [facility];  Service: General;  Laterality: N/A;   IR RADIOLOGIST EVAL & MGMT  11/07/2019   IR RADIOLOGIST EVAL & MGMT  04/25/2020   IR RADIOLOGIST EVAL & MGMT  12/05/2020   IR RADIOLOGIST EVAL & MGMT  06/30/2021   POLYPECTOMY  02/17/2017   Procedure: POLYPECTOMY;  Surgeon: Toney Reil, MD;  Location: [facility];  Service: Endoscopy;;   POLYPECTOMY N/A 10/12/2019   Procedure: POLYPECTOMY;  Surgeon: Toney Reil, MD;  Location: [facility];  Service: Endoscopy;  Laterality: N/A;   SHOULDER ARTHROSCOPY Left 2008   SINUS EXPLORATION       Social History   Tobacco Use   Smoking status: Former    Current packs/day: 0.00    Average packs/day: 0.3 packs/day for 50.0 years (12.5 ttl pk-yrs)    Types: Cigarettes    Start date: 05/02/1968    Quit date: 05/03/2018    Years since quitting: 4.8   Smokeless tobacco: Former   Tobacco comments:    pt quit smoking  Vaping Use   Vaping status: Never Used  Substance Use Topics   Alcohol use: Yes    Comment: Occasional -1x/month   Drug use: No      Family History  Problem Relation Age of Onset   Diabetes Father    Heart disease Father 81   Stroke Mother      Allergies  Allergen Reactions   Amoxicillin-Pot Clavulanate Hives and Nausea And Vomiting    REVIEW OF SYSTEMS (Negative unless checked)   Constitutional: [] Weight loss  [] Fever  [] Chills Cardiac: [] Chest pain   [] Chest pressure   [] Palpitations   [] Shortness of breath when laying flat    [] Shortness of breath at rest   [] Shortness of breath with exertion. Vascular:  [] Pain in legs with walking   [] Pain in legs at rest   [] Pain in legs when laying flat   [] Claudication   [] Pain in feet when walking  [] Pain in feet at rest  [] Pain in feet when laying flat   [] History of DVT   [] Phlebitis   [x] Swelling in legs   [] Varicose veins   [x] Non-healing ulcers Pulmonary:   [] Uses home oxygen   [] Productive cough   [] Hemoptysis   [] Wheeze  [] COPD   [] Asthma Neurologic:  [] Dizziness  [] Blackouts   [] Seizures   [] History of stroke   [] History of TIA  [] Aphasia   [] Temporary blindness   [] Dysphagia   [] Weakness or numbness in arms   [] Weakness or numbness in legs Musculoskeletal:  [] Arthritis   [] Joint swelling   [  x]Joint pain   [] Low back pain Hematologic:  [] Easy bruising  [] Easy bleeding   [] Hypercoagulable state   [x] Anemic  [] Hepatitis Gastrointestinal:  [] Blood in stool   [] Vomiting blood  [] Gastroesophageal reflux/heartburn   [] Abdominal pain Genitourinary:  [] Chronic kidney disease   [] Difficult urination  [] Frequent urination  [] Burning with urination   [] Hematuria Skin:  [] Rashes   [x] Ulcers   [x] Wounds Psychological:  [] History of anxiety   []  History of major depression.   Physical Examination  BP 126/75   Pulse 82   Resp 16   Wt 254 lb (115.2 kg)   BMI 36.45 kg/m  Gen:  WD/WN, NAD. Appears younger than stated age.  Head: [place]/AT, No temporalis wasting. Ear/Nose/Throat: Hearing grossly intact, nares w/o erythema or drainage Eyes: Conjunctiva clear. Sclera non-icteric Neck: Supple.  Trachea midline Pulmonary:  Good air movement, no use of accessory muscles.  Cardiac: RRR, no JVD Vascular:  Vessel Right Left  Radial Palpable Palpable                          PT Palpable Palpable  DP Palpable Palpable   Gastrointestinal: soft, non-tender/non-distended. No guarding/reflex.  Musculoskeletal: M/S 5/5 throughout.  No deformity or atrophy. No edema. Neurologic: Sensation  grossly intact in extremities.  Symmetrical.  Speech is fluent.  Psychiatric: Judgment intact, Mood & affect appropriate for pt's clinical situation. Dermatologic: No rashes or ulcers noted.  No cellulitis or open wounds.      Labs Recent Results (from the past 2160 hours)  Hemoglobin and Hematocrit (Cancer Center Only)     Status: Abnormal   Collection Time: 12/23/22 10:17 AM  Result Value Ref Range   Hemoglobin 8.5 (L) 13.0 - 17.0 g/dL   HCT 40.9 (L) 81.1 - 91.4 %    Comment: Performed at [facility], [address] [place], [place] [address]  Hemoglobin and Hematocrit (Cancer Center Only)     Status: Abnormal   Collection Time: 01/06/23 10:47 AM  Result Value Ref Range   Hemoglobin 8.1 (L) 13.0 - 17.0 g/dL   HCT 62.1 (L) 30.8 - 65.7 %    Comment: Performed at [facility], [address] [place], [place] [address]  Hemoglobin and Hematocrit (Cancer Center Only)     Status: Abnormal   Collection Time: 01/21/23 10:44 AM  Result Value Ref Range   Hemoglobin 8.1 (L) 13.0 - 17.0 g/dL   HCT 29.5 (L) 28.4 - 13.2 %    Comment: Performed at [facility], [address] [place], [place] [address]  CMP (Cancer Center only)     Status: Abnormal   Collection Time: 02/04/23 10:48 AM  Result Value Ref Range   Sodium 137 135 - 145 mmol/L   Potassium 4.4 3.5 - 5.1 mmol/L   Chloride 104 98 - 111 mmol/L   CO2 25 22 - 32 mmol/L   Glucose, Bld 111 (H) 70 - 99 mg/dL    Comment: Glucose reference range applies only to samples taken after fasting for at least 8 hours.   BUN 21 8 - 23 mg/dL   Creatinine 2.72 5.36 - 1.24 mg/dL   Calcium 8.9 8.9 - 64.4 mg/dL   Total Protein 6.8 6.5 - 8.1 g/dL   Albumin 4.1 3.5 - 5.0 g/dL   AST 21 15 - 41 U/L   ALT 15 0 - 44 U/L   Alkaline Phosphatase 43 38 - 126 U/L   Total Bilirubin 1.8 (H) 0.0 -  1.2 mg/dL   GFR, Estimated >95 >28 mL/min    Comment: (NOTE) Calculated using the CKD-EPI Creatinine Equation (2021)    Anion gap 8 5 -  15    Comment: Performed at [facility], [address] [place], [place] [address]  CBC with Differential (Cancer Center Only)     Status: Abnormal   Collection Time: 02/04/23 10:49 AM  Result Value Ref Range   WBC Count 7.1 4.0 - 10.5 K/uL   RBC 2.63 (L) 4.22 - 5.81 MIL/uL   Hemoglobin 8.8 (L) 13.0 - 17.0 g/dL   HCT 40.1 (L) 02.7 - 25.3 %   MCV 104.2 (H) 80.0 - 100.0 fL   MCH 33.5 26.0 - 34.0 pg   MCHC 32.1 30.0 - 36.0 g/dL   RDW 66.4 (H) 40.3 - 47.4 %   Platelet Count 310 150 - 400 K/uL   nRBC 0.6 (H) 0.0 - 0.2 %   Neutrophils Relative % 72 %   Neutro Abs 5.1 1.7 - 7.7 K/uL   Lymphocytes Relative 17 %   Lymphs Abs 1.2 0.7 - 4.0 K/uL   Monocytes Relative 9 %   Monocytes Absolute 0.7 0.1 - 1.0 K/uL   Eosinophils Relative 2 %   Eosinophils Absolute 0.1 0.0 - 0.5 K/uL   Basophils Relative 0 %   Basophils Absolute 0.0 0.0 - 0.1 K/uL   Immature Granulocytes 0 %   Abs Immature Granulocytes 0.03 0.00 - 0.07 K/uL    Comment: Performed at [facility], [address] [place], [place] [address]  CMP (Cancer Center only)     Status: Abnormal   Collection Time: 02/19/23 12:45 PM  Result Value Ref Range   Sodium 136 135 - 145 mmol/L   Potassium 4.4 3.5 - 5.1 mmol/L   Chloride 104 98 - 111 mmol/L   CO2 24 22 - 32 mmol/L   Glucose, Bld 150 (H) 70 - 99 mg/dL    Comment: Glucose reference range applies only to samples taken after fasting for at least 8 hours.   BUN 25 (H) 8 - 23 mg/dL   Creatinine 3.87 5.64 - 1.24 mg/dL   Calcium 8.8 (L) 8.9 - 10.3 mg/dL   Total Protein 6.2 (L) 6.5 - 8.1 g/dL   Albumin 3.9 3.5 - 5.0 g/dL   AST 25 15 - 41 U/L   ALT 22 0 - 44 U/L   Alkaline Phosphatase 38 38 - 126 U/L   Total Bilirubin 1.4 (H) 0.0 - 1.2 mg/dL   GFR, Estimated >33 >29 mL/min    Comment: (NOTE) Calculated using the CKD-EPI Creatinine Equation (2021)    Anion gap 8 5 - 15    Comment: Performed at [facility], [address] [place], [place] [address]  CBC with  Differential (Cancer Center Only)     Status: Abnormal   Collection Time: 02/19/23 12:45 PM  Result Value Ref Range   WBC Count 5.7 4.0 - 10.5 K/uL   RBC 2.60 (L) 4.22 - 5.81 MIL/uL   Hemoglobin 8.6 (L) 13.0 - 17.0 g/dL   HCT 16.6 (L) 06.3 - 01.6 %   MCV 102.3 (H) 80.0 - 100.0 fL   MCH 33.1 26.0 - 34.0 pg   MCHC 32.3 30.0 - 36.0 g/dL   RDW 01.0 (H) 93.2 - 35.5 %   Platelet Count 289 150 - 400 K/uL   nRBC 0.5 (H) 0.0 - 0.2 %   Neutrophils Relative % 68 %   Neutro Abs  3.8 1.7 - 7.7 K/uL   Lymphocytes Relative 22 %   Lymphs Abs 1.3 0.7 - 4.0 K/uL   Monocytes Relative 8 %   Monocytes Absolute 0.5 0.1 - 1.0 K/uL   Eosinophils Relative 1 %   Eosinophils Absolute 0.1 0.0 - 0.5 K/uL   Basophils Relative 1 %   Basophils Absolute 0.0 0.0 - 0.1 K/uL   Immature Granulocytes 0 %   Abs Immature Granulocytes 0.02 0.00 - 0.07 K/uL    Comment: Performed at [facility], [address] [place], [place] [address]  Ferritin     Status: Abnormal   Collection Time: 02/19/23 12:45 PM  Result Value Ref Range   Ferritin 1,185 (H) 24 - 336 ng/mL    Comment: Performed at [facility], [address] [place], [place] [address]  Hemoglobin and Hematocrit (Cancer Center Only)     Status: Abnormal   Collection Time: 03/03/23 12:55 PM  Result Value Ref Range   Hemoglobin 8.5 (L) 13.0 - 17.0 g/dL   HCT 09.8 (L) 11.9 - 14.7 %    Comment: Performed at [facility], [address] [place], [place] [address]  CBC with Differential/Platelet     Status: Abnormal   Collection Time: 03/04/23 12:02 PM  Result Value Ref Range   WBC 5.8 4.0 - 10.5 K/uL   RBC 2.49 (L) 4.22 - 5.81 MIL/uL   Hemoglobin 8.4 (L) 13.0 - 17.0 g/dL   HCT 21.3 (L) 08.6 - 57.8 %   MCV 106.0 (H) 80.0 - 100.0 fL   MCH 33.7 26.0 - 34.0 pg   MCHC 31.8 30.0 - 36.0 g/dL   RDW 46.9 (H) 62.9 - 52.8 %   Platelets 261 150 - 400 K/uL   nRBC 0.5 (H) 0.0 - 0.2 %   Neutrophils Relative % 71 %   Neutro Abs 4.1 1.7 - 7.7 K/uL    Lymphocytes Relative 11 %   Lymphs Abs 0.7 0.7 - 4.0 K/uL   Monocytes Relative 13 %   Monocytes Absolute 0.7 0.1 - 1.0 K/uL   Eosinophils Relative 4 %   Eosinophils Absolute 0.2 0.0 - 0.5 K/uL   Basophils Relative 1 %   Basophils Absolute 0.0 0.0 - 0.1 K/uL   RBC Morphology MORPHOLOGY UNREMARKABLE    Immature Granulocytes 0 %   Abs Immature Granulocytes 0.02 0.00 - 0.07 K/uL    Comment: Performed at [facility], [name] [place], [place] [address]  Renal Function Panel     Status: Abnormal   Collection Time: 03/04/23 12:02 PM  Result Value Ref Range   Sodium 133 (L) 135 - 145 mmol/L   Potassium 4.4 3.5 - 5.1 mmol/L   Chloride 102 98 - 111 mmol/L   CO2 26 22 - 32 mmol/L   Glucose, Bld 130 (H) 70 - 99 mg/dL    Comment: Glucose reference range applies only to samples taken after fasting for at least 8 hours.   BUN 14 8 - 23 mg/dL   Creatinine, Ser 4.01 0.61 - 1.24 mg/dL   Calcium 8.4 (L) 8.9 - 10.3 mg/dL   Phosphorus 2.5 2.5 - 4.6 mg/dL   Albumin 4.3 3.5 - 5.0 g/dL   GFR, Estimated >02 >72 mL/min    Comment: (NOTE) Calculated using the CKD-EPI Creatinine Equation (2021)    Anion gap 5 5 - 15    Comment: Performed at [facility], [address] [place], [place] [address]  Radiology DG Chest 2 View Result Date: 03/04/2023 CLINICAL DATA:  Shortness of breath, COPD exacerbation. EXAM: CHEST - 2 VIEW COMPARISON:  07/16/2022 FINDINGS: Heart is normal size. Aorta normal caliber. No confluent airspace opacities or effusions. No acute bony abnormality. IMPRESSION: No active cardiopulmonary disease. Electronically Signed   By: Charlett Nose M.D.   On: 03/04/2023 12:42    Assessment/Plan  Varicose veins of both lower extremities with inflammation Marked improvement with daily use of compression socks and use of the lymphedema pump.  At this point, after discussions with the patient we are going to hold on laser ablation going forward.  He does have  significant venous reflux but he also has deep venous reflux so even with laser ablation, he will need to continue all these conservative measures and he is pleased with where he is at so far.  If he begins developing worsening symptoms, laser ablation of the great saphenous veins will be considered.  Otherwise, I will plan on seeing him back in 6 months.  Lymphedema Swelling and symptoms are much improved after getting his lymphedema pump and using this daily and continuing to wear his 20 to 30 mmHg compression socks daily.    Festus Barren, MD  03/16/2023 5:31 PM    This note was created with Dragon medical transcription system.  Any errors from dictation are purely unintentional

## 2023-03-17 ENCOUNTER — Inpatient Hospital Stay: Payer: Medicare Other

## 2023-03-17 ENCOUNTER — Other Ambulatory Visit: Payer: Self-pay

## 2023-03-17 VITALS — BP 137/83

## 2023-03-17 DIAGNOSIS — D649 Anemia, unspecified: Secondary | ICD-10-CM

## 2023-03-17 DIAGNOSIS — D461 Refractory anemia with ring sideroblasts: Secondary | ICD-10-CM | POA: Diagnosis not present

## 2023-03-17 LAB — HEMOGLOBIN AND HEMATOCRIT (CANCER CENTER ONLY)
HCT: 29.3 % — ABNORMAL LOW (ref 39.0–52.0)
Hemoglobin: 9.2 g/dL — ABNORMAL LOW (ref 13.0–17.0)

## 2023-03-17 MED ORDER — DARBEPOETIN ALFA 300 MCG/0.6ML IJ SOSY
300.0000 ug | PREFILLED_SYRINGE | Freq: Once | INTRAMUSCULAR | Status: AC
  Administered 2023-03-17: 300 ug via SUBCUTANEOUS
  Filled 2023-03-17: qty 0.6

## 2023-03-19 ENCOUNTER — Inpatient Hospital Stay: Payer: Medicare Other

## 2023-03-27 ENCOUNTER — Other Ambulatory Visit: Payer: Self-pay | Admitting: Family Medicine

## 2023-03-27 DIAGNOSIS — J441 Chronic obstructive pulmonary disease with (acute) exacerbation: Secondary | ICD-10-CM

## 2023-03-31 ENCOUNTER — Inpatient Hospital Stay: Payer: Medicare Other

## 2023-03-31 ENCOUNTER — Inpatient Hospital Stay: Payer: Medicare Other | Attending: Nurse Practitioner

## 2023-03-31 ENCOUNTER — Encounter: Payer: Self-pay | Admitting: Oncology

## 2023-03-31 VITALS — BP 119/67

## 2023-03-31 DIAGNOSIS — D461 Refractory anemia with ring sideroblasts: Secondary | ICD-10-CM | POA: Diagnosis not present

## 2023-03-31 DIAGNOSIS — D649 Anemia, unspecified: Secondary | ICD-10-CM

## 2023-03-31 LAB — HEMOGLOBIN AND HEMATOCRIT (CANCER CENTER ONLY)
HCT: 26.2 % — ABNORMAL LOW (ref 39.0–52.0)
Hemoglobin: 8.4 g/dL — ABNORMAL LOW (ref 13.0–17.0)

## 2023-03-31 MED ORDER — DARBEPOETIN ALFA 300 MCG/0.6ML IJ SOSY
300.0000 ug | PREFILLED_SYRINGE | Freq: Once | INTRAMUSCULAR | Status: AC
  Administered 2023-03-31: 300 ug via SUBCUTANEOUS
  Filled 2023-03-31: qty 0.6

## 2023-04-01 ENCOUNTER — Ambulatory Visit
Admission: RE | Admit: 2023-04-01 | Discharge: 2023-04-01 | Disposition: A | Discharge status: Home / Self Care | Payer: Medicare Other | Source: Ambulatory Visit | Attending: Acute Care | Admitting: Acute Care

## 2023-04-01 DIAGNOSIS — Z87891 Personal history of nicotine dependence: Secondary | ICD-10-CM | POA: Diagnosis not present

## 2023-04-02 ENCOUNTER — Inpatient Hospital Stay: Payer: Medicare Other

## 2023-04-16 ENCOUNTER — Inpatient Hospital Stay: Payer: Medicare Other

## 2023-04-16 ENCOUNTER — Inpatient Hospital Stay

## 2023-04-16 ENCOUNTER — Encounter: Payer: Self-pay | Admitting: Oncology

## 2023-04-16 ENCOUNTER — Inpatient Hospital Stay: Payer: Medicare Other | Admitting: Oncology

## 2023-04-16 VITALS — BP 125/70 | HR 69 | Temp 98.3°F | Resp 16 | Wt 251.0 lb

## 2023-04-16 DIAGNOSIS — N179 Acute kidney failure, unspecified: Secondary | ICD-10-CM | POA: Diagnosis not present

## 2023-04-16 DIAGNOSIS — Z79899 Other long term (current) drug therapy: Secondary | ICD-10-CM | POA: Diagnosis not present

## 2023-04-16 DIAGNOSIS — D649 Anemia, unspecified: Secondary | ICD-10-CM

## 2023-04-16 DIAGNOSIS — R7989 Other specified abnormal findings of blood chemistry: Secondary | ICD-10-CM | POA: Diagnosis not present

## 2023-04-16 DIAGNOSIS — D461 Refractory anemia with ring sideroblasts: Secondary | ICD-10-CM

## 2023-04-16 DIAGNOSIS — D469 Myelodysplastic syndrome, unspecified: Secondary | ICD-10-CM

## 2023-04-16 LAB — CMP (CANCER CENTER ONLY)
ALT: 17 U/L (ref 0–44)
AST: 24 U/L (ref 15–41)
Albumin: 3.9 g/dL (ref 3.5–5.0)
Alkaline Phosphatase: 38 U/L (ref 38–126)
Anion gap: 7 (ref 5–15)
BUN: 24 mg/dL — ABNORMAL HIGH (ref 8–23)
CO2: 25 mmol/L (ref 22–32)
Calcium: 8.8 mg/dL — ABNORMAL LOW (ref 8.9–10.3)
Chloride: 106 mmol/L (ref 98–111)
Creatinine: 1.35 mg/dL — ABNORMAL HIGH (ref 0.61–1.24)
GFR, Estimated: 54 mL/min — ABNORMAL LOW (ref >60)
Glucose, Bld: 123 mg/dL — ABNORMAL HIGH (ref 70–99)
Potassium: 4.1 mmol/L (ref 3.5–5.1)
Sodium: 138 mmol/L (ref 135–145)
Total Bilirubin: 2.1 mg/dL — ABNORMAL HIGH (ref 0.0–1.2)
Total Protein: 6.3 g/dL — ABNORMAL LOW (ref 6.5–8.1)

## 2023-04-16 LAB — CBC (CANCER CENTER ONLY)
HCT: 27.6 % — ABNORMAL LOW (ref 39.0–52.0)
Hemoglobin: 8.6 g/dL — ABNORMAL LOW (ref 13.0–17.0)
MCH: 32.7 pg (ref 26.0–34.0)
MCHC: 31.2 g/dL (ref 30.0–36.0)
MCV: 104.9 fL — ABNORMAL HIGH (ref 80.0–100.0)
Platelet Count: 333 10*3/uL (ref 150–400)
RBC: 2.63 MIL/uL — ABNORMAL LOW (ref 4.22–5.81)
RDW: 25.2 % — ABNORMAL HIGH (ref 11.5–15.5)
WBC Count: 6 10*3/uL (ref 4.0–10.5)
nRBC: 0.3 % — ABNORMAL HIGH (ref 0.0–0.2)

## 2023-04-16 LAB — IRON AND TIBC
Iron: 194 ug/dL — ABNORMAL HIGH (ref 45–182)
Saturation Ratios: 86 % — ABNORMAL HIGH (ref 17.9–39.5)
TIBC: 225 ug/dL — ABNORMAL LOW (ref 250–450)
UIBC: 31 ug/dL

## 2023-04-16 LAB — FERRITIN: Ferritin: 1028 ng/mL — ABNORMAL HIGH (ref 24–336)

## 2023-04-16 MED ORDER — DARBEPOETIN ALFA 300 MCG/0.6ML IJ SOSY
300.0000 ug | PREFILLED_SYRINGE | Freq: Once | INTRAMUSCULAR | Status: AC
  Administered 2023-04-16: 300 ug via SUBCUTANEOUS
  Filled 2023-04-16: qty 0.6

## 2023-04-16 NOTE — Progress Notes (Signed)
No new concerns voiced today 

## 2023-04-17 ENCOUNTER — Encounter: Payer: Self-pay | Admitting: Oncology

## 2023-04-17 ENCOUNTER — Other Ambulatory Visit: Payer: Self-pay

## 2023-04-17 NOTE — Progress Notes (Signed)
 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 Bone marrow biopsy on 09/11/2019 revealed macrocytic anemia and hypercellular bone marrow for age with dyspoietic changes primarily involving the erythroid cell lines associated with abundant ring sideroblasts (> 15%).  There was no increase in blasts. Flow cytometry was negative. Cytogenetics revealed 45,X,-Y [20].  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 IPSS score is low.   Baseline EPO level was 63.3 in August 2021.  Patient received Aranesp for a month in September 2021 and was then switched to Retacrit.  He was again switched to aranesp   As per insurance preference patient found to have elevated ferritin and when we was under the care of Dr. 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  This is being monitored conservatively.  MRI abdomen has shown diffuse iron deposition in the liver but liver enzymes have been normal.   MRI abdomen in September 2021 showed 1.3 cm lesion in the left kidney concerning for renal cell carcinoma for which he follows up with urology      Interval history-patient continues to have a good quality of life presently and denies any specific complaints at this time  ECOG PS- 1 Pain scale- 0   Review of systems- Review of Systems  Constitutional:  Negative for chills, fever,  malaise/fatigue and weight loss.  HENT:  Negative for congestion, ear discharge and nosebleeds.   Eyes:  Negative for blurred vision.  Respiratory:  Negative for cough, hemoptysis, sputum production, shortness of breath and wheezing.   Cardiovascular:  Negative for chest pain, palpitations, orthopnea and claudication.  Gastrointestinal:  Negative for abdominal pain, blood in stool, constipation, diarrhea, heartburn, melena, nausea and vomiting.  Genitourinary:  Negative for dysuria, flank pain, frequency, hematuria and urgency.  Musculoskeletal:  Negative for back pain, joint pain and myalgias.  Skin:  Negative for rash.  Neurological:  Negative for dizziness, tingling, focal weakness, seizures, weakness and headaches.  Endo/Heme/Allergies:  Does not bruise/bleed easily.  Psychiatric/Behavioral:  Negative for depression and suicidal ideas. The patient does not have insomnia.       Allergies  Allergen Reactions   Amoxicillin-Pot Clavulanate Hives and Nausea And Vomiting     Past Medical History:  Diagnosis Date   Anemia    Heart murmur    Hydrocele    MDS (myelodysplastic syndrome) (HCC)    Rotator cuff tear arthropathy of right shoulder 08/01/2020   Venous stasis dermatitis of both lower extremities      Past Surgical History:  Procedure Laterality Date   CATARACT EXTRACTION W/PHACO Right 03/09/2017   Procedure: CATARACT EXTRACTION PHACO AND INTRAOCULAR LENS PLACEMENT (IOC) RIGHT;  Surgeon: Nevada Crane, MD;  Location: [facility];  Service: Ophthalmology;  Laterality: Right;   CATARACT EXTRACTION W/PHACO Left 08/25/2021   Procedure: CATARACT EXTRACTION PHACO AND INTRAOCULAR LENS PLACEMENT (IOC) LEFT;  Surgeon: Nevada Crane, MD;  Location: [facility];  Service:  Ophthalmology;  Laterality: Left;   CATARACT EXTRACTION W/PHACO Left 10/13/2021   Procedure: CATARACT EXTRACTION PHACO AND INTRAOCULAR LENS PLACEMENT (IOC) LEFT;  Surgeon: Nevada Crane,  MD;  Location: [facility];  Service: Ophthalmology;  Laterality: Left;  8.12 0:52.0   COLONOSCOPY  2013   normal- cleared for 5 yrs- Dr Theodoro Parma   COLONOSCOPY WITH PROPOFOL N/A 02/17/2017   Procedure: COLONOSCOPY WITH PROPOFOL;  Surgeon: Toney Reil, MD;  Location: [facility];  Service: Endoscopy;  Laterality: N/A;   COLONOSCOPY WITH PROPOFOL N/A 10/12/2019   Procedure: COLONOSCOPY WITH BIOPSY;  Surgeon: Toney Reil, MD;  Location: [facility];  Service: Endoscopy;  Laterality: N/A;   EPIGASTRIC HERNIA REPAIR N/A 07/06/2014   Procedure: HERNIA REPAIR EPIGASTRIC ADULT;  Surgeon: Duwaine Maxin, MD;  Location: [facility];  Service: General;  Laterality: N/A;   HERNIA REPAIR Bilateral    INSERTION OF MESH N/A 07/06/2014   Procedure: INSERTION OF MESH;  Surgeon: Duwaine Maxin, MD;  Location: [facility];  Service: General;  Laterality: N/A;   IR RADIOLOGIST EVAL & MGMT  11/07/2019   IR RADIOLOGIST EVAL & MGMT  04/25/2020   IR RADIOLOGIST EVAL & MGMT  12/05/2020   IR RADIOLOGIST EVAL & MGMT  06/30/2021   POLYPECTOMY  02/17/2017   Procedure: POLYPECTOMY;  Surgeon: Toney Reil, MD;  Location: [facility];  Service: Endoscopy;;   POLYPECTOMY N/A 10/12/2019   Procedure: POLYPECTOMY;  Surgeon: Toney Reil, MD;  Location: [facility];  Service: Endoscopy;  Laterality: N/A;   SHOULDER ARTHROSCOPY Left 2008   SINUS EXPLORATION      Social History   Socioeconomic History   Marital status: Married    Spouse name: Not on file   Number of children: 3   Years of education: Not on file   Highest education level: Bachelor's degree (e.g., BA, AB, BS)  Occupational History   Occupation: Retired  Tobacco Use   Smoking status: Former    Current packs/day: 0.00    Average packs/day: 0.3 packs/day for 50.0 years (12.5 ttl pk-yrs)    Types: Cigarettes    Start date: 05/02/1968    Quit date: 05/03/2018    Years since quitting: 4.9    Smokeless tobacco: Former   Tobacco comments:    pt quit smoking  Vaping Use   Vaping status: Never Used  Substance and Sexual Activity   Alcohol use: Yes    Comment: Occasional -1x/month   Drug use: No   Sexual activity: Yes  Other Topics Concern   Not on file  Social History Narrative   Not on file   Social Drivers of Health   Financial Resource Strain: Low Risk  (03/04/2023)   Overall Financial Resource Strain (CARDIA)    Difficulty of Paying Living Expenses: Not hard at all  Food Insecurity: No Food Insecurity (03/04/2023)   Hunger Vital Sign    Worried About Running Out of Food in the Last Year: Never true    Ran Out of Food in the Last Year: Never true  Transportation Needs: No Transportation Needs (03/04/2023)   PRAPARE - Administrator, Civil Service (Medical): No    Lack of Transportation (Non-Medical): No  Physical Activity: Sufficiently Active (03/04/2023)   Exercise Vital Sign    Days of Exercise per Week: 3 days    Minutes of Exercise per Session: 60 min  Stress: No Stress Concern Present (03/04/2023)   [employer] of Occupational Health -  Occupational Stress Questionnaire    Feeling of Stress : Not at all  Social Connections: Socially Integrated (03/04/2023)   Social Connection and Isolation Panel [NHANES]    Frequency of Communication with Friends and Family: More than three times a week    Frequency of Social Gatherings with Friends and Family: Three times a week    Attends Religious Services: 1 to 4 times per year    Active Member of Clubs or Organizations: Yes    Attends Banker Meetings: 1 to 4 times per year    Marital Status: Married  Catering manager Violence: Not At Risk (07/16/2022)   Humiliation, Afraid, Rape, and Kick questionnaire    Fear of Current or Ex-Partner: No    Emotionally Abused: No    Physically Abused: No    Sexually Abused: No    Family History  Problem Relation Age of Onset   Diabetes Father    Heart  disease Father 35   Stroke Mother      Current Outpatient Medications:    acetaminophen (TYLENOL) 325 MG tablet, Take 650 mg by mouth every 6 (six) hours as needed., Disp: , Rfl:    albuterol (VENTOLIN HFA) 108 (90 Base) MCG/ACT inhaler, Inhale 2 puffs into the lungs every 4 (four) hours as needed for wheezing or shortness of breath., Disp: 8 g, Rfl: 2   Calcium Carb-Cholecalciferol 600-400 MG-UNIT TABS, Take by mouth., Disp: , Rfl:    Darbepoetin Alfa (ARANESP) 25 MCG/0.42ML SOSY injection, Inject 25 mcg into the skin., Disp: , Rfl:    fluticasone-salmeterol (ADVAIR) 100-50 MCG/ACT AEPB, Inhale 1 puff into the lungs 2 (two) times daily., Disp: 1 each, Rfl: 3   ondansetron (ZOFRAN) 4 MG tablet, Take 1 tablet (4 mg total) by mouth every 8 (eight) hours as needed for nausea or vomiting., Disp: 20 tablet, Rfl: 0   predniSONE (DELTASONE) 10 MG tablet, Take 1 tablet (10 mg total) by mouth daily with breakfast., Disp: 30 tablet, Rfl: 0   Spacer/Aero-Holding Chambers (PROCHAMBER VHC) DEVI, 1 Inhalation by Does not apply route every 6 (six) hours., Disp: 1 each, Rfl: DEVICE   tadalafil (CIALIS) 5 MG tablet, Take 1-4 tablets (5-20 mg total) by mouth daily as needed for erectile dysfunction., Disp: 90 tablet, Rfl: 6   budesonide-formoterol (SYMBICORT) 80-4.5 MCG/ACT inhaler, Inhale 2 puffs into the lungs 2 (two) times daily. (Patient not taking: Reported on 03/16/2023), Disp: 1 each, Rfl: 3   doxycycline (VIBRA-TABS) 100 MG tablet, Take 1 tablet (100 mg total) by mouth 2 (two) times daily. (Patient not taking: Reported on 03/16/2023), Disp: 20 tablet, Rfl: 0 No current facility-administered medications for this visit.  Facility-Administered Medications Ordered in Other Visits:    Darbepoetin Alfa (ARANESP) injection 600 mcg, 600 mcg, Subcutaneous, Q14 Days, Creig Hines, MD, 600 mcg at 03/13/22 1032   Darbepoetin Alfa (ARANESP) injection 600 mcg, 600 mcg, Subcutaneous, Q14 Days, Creig Hines, MD, 600  mcg at 06/12/22 1052  Physical exam:  Vitals:   04/16/23 1316 04/16/23 1317  BP:  125/70  Pulse:  69  Resp:  16  Temp:  98.3 F (36.8 C)  TempSrc:  Oral  SpO2:  100%  Weight: 251 lb (113.9 kg)    Physical Exam Cardiovascular:     Rate and Rhythm: Normal rate and regular rhythm.     Heart sounds: Normal heart sounds.  Pulmonary:     Effort: Pulmonary effort is normal.     Breath sounds: Normal breath sounds.  Skin:    General: Skin is warm and dry.  Neurological:     Mental Status: He is alert and oriented to person, place, and time.         Latest Ref Rng & Units 04/16/2023   12:58 PM  CMP  Glucose 70 - 99 mg/dL 161   BUN 8 - 23 mg/dL 24   Creatinine 0.96 - 1.24 mg/dL 0.45   Sodium 409 - 811 mmol/L 138   Potassium 3.5 - 5.1 mmol/L 4.1   Chloride 98 - 111 mmol/L 106   CO2 22 - 32 mmol/L 25   Calcium 8.9 - 10.3 mg/dL 8.8   Total Protein 6.5 - 8.1 g/dL 6.3   Total Bilirubin 0.0 - 1.2 mg/dL 2.1   Alkaline Phos 38 - 126 U/L 38   AST 15 - 41 U/L 24   ALT 0 - 44 U/L 17       Latest Ref Rng & Units 04/16/2023   12:58 PM  CBC  WBC 4.0 - 10.5 K/uL 6.0   Hemoglobin 13.0 - 17.0 g/dL 8.6   Hematocrit 91.4 - 52.0 % 27.6   Platelets 150 - 400 K/uL 333     No images are attached to the encounter.  No results found.   Assessment and plan- Patient is a 78 y.o. male who is here for follow-up of following issues:  Low risk MDS with SF 3 B1 mutation: He is presently on Aranesp every 2 weeks and hemoglobin has remained between 8-9.  We discussed switching to Luspatercept now versus considering it if hemoglobin goes to less than 8.  Presently patient has a good quality of life despite his hemoglobin in the about range.  We will therefore continue Aranesp.  Elevated ferritin: Etiology unclear as heterozygosity for H63D does not lead to iron overload.  MRI abdomen in the past has shown iron deposition in the liver but no overt cirrhosis and his liver enzymes are normal.  I will  plan to get MRI abdomen again in May 2025 which will also keep an eye on his kidney cyst for which she follows up with urology.  Elevated bilirubin: Patient's bilirubin fluctuates between 1.4-2 over the years.  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  I will check his bilirubin again in 2 weeks time when he comes for his next  AKI: Serum creatinine mildly elevated at 1.3 as compared to 1 which is his baseline.  Have encouraged him to increase his oral fluid intake and we will repeat serum creatinine levels as well in 2 weeks time   Visit Diagnosis 1. Refractory anemia with ring sideroblasts (HCC)   2. Erythropoietin (EPO) stimulating agent anemia management patient   3. Elevated ferritin   4. AKI (acute kidney injury) (HCC)      Dr. Owens Shark, MD, MPH [facility] at [facility] [phone] 04/17/2023 8:52 AM

## 2023-04-27 ENCOUNTER — Telehealth: Payer: Self-pay

## 2023-04-27 ENCOUNTER — Other Ambulatory Visit: Payer: Self-pay

## 2023-04-27 DIAGNOSIS — Z122 Encounter for screening for malignant neoplasm of respiratory organs: Secondary | ICD-10-CM

## 2023-04-27 DIAGNOSIS — Z87891 Personal history of nicotine dependence: Secondary | ICD-10-CM

## 2023-04-27 NOTE — Telephone Encounter (Signed)
 Called and spoke with patient. Reviewed results. Patient will complete annual LDCT again next year. He is aware of the dilated aorta, aortic atherosclerosis, coronary artery atherosclerosis possible splenomegaly and esophageal wall thickening. Pt states "none of this is new. I have heard all of this before." Pt states he does not take any medication, by choice. No statin therapy or reflux medicine. Pt to discuss results with PCP and Cardiologist for recommendations. Results and plan sent to Dr. Yetta Barre and Dr. Juliann Pares.    IMPRESSION: Lung-RADS 2, benign appearance or behavior. Continue annual screening with low-dose chest CT without contrast in 12 months.   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 This can be re-evaluated on follow-up lung cancer screening CT in 1 year.   Aortic atherosclerosis (ICD10-I70.0), coronary artery atherosclerosis and emphysema (ICD10-J43.9).   Suspect developing splenomegaly, incompletely imaged.   Possible mild esophageal wall thickening as can be seen with esophagitis.

## 2023-04-28 ENCOUNTER — Inpatient Hospital Stay: Attending: Nurse Practitioner

## 2023-04-28 ENCOUNTER — Ambulatory Visit: Payer: Self-pay

## 2023-04-28 ENCOUNTER — Inpatient Hospital Stay

## 2023-04-28 VITALS — BP 138/71

## 2023-04-28 DIAGNOSIS — Z0000 Encounter for general adult medical examination without abnormal findings: Secondary | ICD-10-CM | POA: Diagnosis not present

## 2023-04-28 DIAGNOSIS — D461 Refractory anemia with ring sideroblasts: Secondary | ICD-10-CM | POA: Diagnosis not present

## 2023-04-28 DIAGNOSIS — D649 Anemia, unspecified: Secondary | ICD-10-CM

## 2023-04-28 LAB — CMP (CANCER CENTER ONLY)
ALT: 17 U/L (ref 0–44)
AST: 21 U/L (ref 15–41)
Albumin: 4 g/dL (ref 3.5–5.0)
Alkaline Phosphatase: 45 U/L (ref 38–126)
Anion gap: 7 (ref 5–15)
BUN: 21 mg/dL (ref 8–23)
CO2: 26 mmol/L (ref 22–32)
Calcium: 8.9 mg/dL (ref 8.9–10.3)
Chloride: 103 mmol/L (ref 98–111)
Creatinine: 1.21 mg/dL (ref 0.61–1.24)
GFR, Estimated: >60 mL/min (ref >60)
Glucose, Bld: 122 mg/dL — ABNORMAL HIGH (ref 70–99)
Potassium: 5 mmol/L (ref 3.5–5.1)
Sodium: 136 mmol/L (ref 135–145)
Total Bilirubin: 2.6 mg/dL — ABNORMAL HIGH (ref 0.0–1.2)
Total Protein: 6.5 g/dL (ref 6.5–8.1)

## 2023-04-28 LAB — HEMOGLOBIN AND HEMATOCRIT (CANCER CENTER ONLY)
HCT: 29.9 % — ABNORMAL LOW (ref 39.0–52.0)
Hemoglobin: 9.3 g/dL — ABNORMAL LOW (ref 13.0–17.0)

## 2023-04-28 MED ORDER — DARBEPOETIN ALFA 300 MCG/0.6ML IJ SOSY
300.0000 ug | PREFILLED_SYRINGE | Freq: Once | INTRAMUSCULAR | Status: AC
  Administered 2023-04-28: 300 ug via SUBCUTANEOUS
  Filled 2023-04-28: qty 0.6

## 2023-04-28 NOTE — Patient Instructions (Addendum)
 Mr. Brucato , Thank you for taking time to come for your Medicare Wellness Visit. I appreciate your ongoing commitment to your health goals. Please review the following plan we discussed and let me know if I can assist you in the future.   Referrals/Orders/Follow-Ups/Clinician Recommendations: NONE  This is a list of the screening recommended for you and due dates:  Health Maintenance  Topic Date Due   Zoster (Shingles) Vaccine (1 of 2) Never done   COVID-19 Vaccine (4 - 2024-25 season) 09/27/2022   Colon Cancer Screening  10/12/2022   Flu Shot  08/27/2023   Medicare Annual Wellness Visit  04/27/2024   DTaP/Tdap/Td vaccine (2 - Td or Tdap) 08/29/2024   Pneumonia Vaccine  Completed   Hepatitis C Screening  Completed   HPV Vaccine  Aged Out    Advanced directives: (ACP Link)Information on Advanced Care Planning can be found at [facility] [place] Advance Health Care Directives Advance Health Care Directives. [email or web address]   Next Medicare Annual Wellness Visit scheduled for next year: Yes   05/03/24 @ 12:40 PM BY PHONE

## 2023-04-28 NOTE — Progress Notes (Signed)
 Subjective:   Brett Boone is a 78 y.o. who presents for a Medicare Wellness preventive visit.  Visit Complete: Virtual I connected with  Brett Boone on 04/28/23 by a audio enabled telemedicine application and verified that I am speaking with the correct person using two identifiers.  Patient Location: Home  Provider Location: Office/Clinic  I discussed the limitations of evaluation and management by telemedicine. The patient expressed understanding and agreed to proceed.  Vital Signs: Because this visit was a virtual/telehealth visit, some criteria may be missing or patient reported. Any vitals not documented were not able to be obtained and vitals that have been documented are patient reported.  VideoDeclined- This patient declined Librarian, academic. Therefore the visit was completed with audio only.  Persons Participating in Visit: Patient.  AWV Questionnaire: No: Patient Medicare AWV questionnaire was not completed prior to this visit.  Cardiac Risk Factors include: advanced age (>37men, >53 women);male gender;obesity (BMI >30kg/m2)     Objective:    Today's Vitals   04/28/23 1359  PainSc: 0-No pain   There is no height or weight on file to calculate BMI.     04/28/2023    2:04 PM 04/16/2023    1:20 PM 02/19/2023    1:15 PM 11/11/2022   11:36 AM 10/14/2022   11:25 AM 08/04/2022   11:08 AM 07/16/2022    6:00 PM  Advanced Directives  Does Patient Have a Medical Advance Directive? No No No No No No No  Would patient like information on creating a medical advance directive? No - Patient declined No - Patient declined No - Patient declined Yes (ED - Information included in AVS) No - Patient declined No - Patient declined No - Patient declined    Current Medications (verified) Outpatient Encounter Medications as of 04/28/2023  Medication Sig   acetaminophen (TYLENOL) 325 MG tablet Take 650 mg by mouth every 6 (six) hours as  needed.   albuterol (VENTOLIN HFA) 108 (90 Base) MCG/ACT inhaler Inhale 2 puffs into the lungs every 4 (four) hours as needed for wheezing or shortness of breath.   Calcium Carb-Cholecalciferol 600-400 MG-UNIT TABS Take by mouth.   Darbepoetin Alfa (ARANESP) 25 MCG/0.42ML SOSY injection Inject 25 mcg into the skin.   Spacer/Aero-Holding Chambers [facility]) DEVI 1 Inhalation by Does not apply route every 6 (six) hours.   tadalafil (CIALIS) 5 MG tablet Take 1-4 tablets (5-20 mg total) by mouth daily as needed for erectile dysfunction.   budesonide-formoterol (SYMBICORT) 80-4.5 MCG/ACT inhaler Inhale 2 puffs into the lungs 2 (two) times daily. (Patient not taking: Reported on 03/16/2023)   doxycycline (VIBRA-TABS) 100 MG tablet Take 1 tablet (100 mg total) by mouth 2 (two) times daily. (Patient not taking: Reported on 03/16/2023)   fluticasone-salmeterol (ADVAIR) 100-50 MCG/ACT AEPB Inhale 1 puff into the lungs 2 (two) times daily. (Patient not taking: Reported on 04/28/2023)   ondansetron (ZOFRAN) 4 MG tablet Take 1 tablet (4 mg total) by mouth every 8 (eight) hours as needed for nausea or vomiting. (Patient not taking: Reported on 04/28/2023)   predniSONE (DELTASONE) 10 MG tablet Take 1 tablet (10 mg total) by mouth daily with breakfast. (Patient not taking: Reported on 04/28/2023)   Facility-Administered Encounter Medications as of 04/28/2023  Medication   [COMPLETED] Darbepoetin Alfa (ARANESP) injection 300 mcg   [COMPLETED] Darbepoetin Alfa (ARANESP) injection 300 mcg   Darbepoetin Alfa (ARANESP) injection 600 mcg   Darbepoetin Alfa (ARANESP) injection 600 mcg  Allergies (verified) Amoxicillin-pot clavulanate   History: Past Medical History:  Diagnosis Date   Anemia    Heart murmur    Hydrocele    MDS (myelodysplastic syndrome) (HCC)    Rotator cuff tear arthropathy of right shoulder 08/01/2020   Venous stasis dermatitis of both lower extremities    Past Surgical History:  Procedure  Laterality Date   CATARACT EXTRACTION W/PHACO Right 03/09/2017   Procedure: CATARACT EXTRACTION PHACO AND INTRAOCULAR LENS PLACEMENT (IOC) RIGHT;  Surgeon: Nevada Crane, MD;  Location: [facility];  Service: Ophthalmology;  Laterality: Right;   CATARACT EXTRACTION W/PHACO Left 08/25/2021   Procedure: CATARACT EXTRACTION PHACO AND INTRAOCULAR LENS PLACEMENT (IOC) LEFT;  Surgeon: Nevada Crane, MD;  Location: [facility];  Service: Ophthalmology;  Laterality: Left;   CATARACT EXTRACTION W/PHACO Left 10/13/2021   Procedure: CATARACT EXTRACTION PHACO AND INTRAOCULAR LENS PLACEMENT (IOC) LEFT;  Surgeon: Nevada Crane, MD;  Location: [facility];  Service: Ophthalmology;  Laterality: Left;  8.12 0:52.0   COLONOSCOPY  2013   normal- cleared for 5 yrs- Dr Theodoro Parma   COLONOSCOPY WITH PROPOFOL N/A 02/17/2017   Procedure: COLONOSCOPY WITH PROPOFOL;  Surgeon: Toney Reil, MD;  Location: [facility];  Service: Endoscopy;  Laterality: N/A;   COLONOSCOPY WITH PROPOFOL N/A 10/12/2019   Procedure: COLONOSCOPY WITH BIOPSY;  Surgeon: Toney Reil, MD;  Location: [facility];  Service: Endoscopy;  Laterality: N/A;   EPIGASTRIC HERNIA REPAIR N/A 07/06/2014   Procedure: HERNIA REPAIR EPIGASTRIC ADULT;  Surgeon: Duwaine Maxin, MD;  Location: [facility];  Service: General;  Laterality: N/A;   HERNIA REPAIR Bilateral    INSERTION OF MESH N/A 07/06/2014   Procedure: INSERTION OF MESH;  Surgeon: Duwaine Maxin, MD;  Location: [facility];  Service: General;  Laterality: N/A;   IR RADIOLOGIST EVAL & MGMT  11/07/2019   IR RADIOLOGIST EVAL & MGMT  04/25/2020   IR RADIOLOGIST EVAL & MGMT  12/05/2020   IR RADIOLOGIST EVAL & MGMT  06/30/2021   POLYPECTOMY  02/17/2017   Procedure: POLYPECTOMY;  Surgeon: Toney Reil, MD;  Location: [facility];  Service: Endoscopy;;   POLYPECTOMY N/A 10/12/2019   Procedure: POLYPECTOMY;  Surgeon: Toney Reil,  MD;  Location: [facility];  Service: Endoscopy;  Laterality: N/A;   SHOULDER ARTHROSCOPY Left 2008   SINUS EXPLORATION     Family History  Problem Relation Age of Onset   Diabetes Father    Heart disease Father 69   Stroke Mother    Social History   Socioeconomic History   Marital status: Married    Spouse name: Not on file   Number of children: 3   Years of education: Not on file   Highest education level: Bachelor's degree (e.g., BA, AB, BS)  Occupational History   Occupation: Retired  Tobacco Use   Smoking status: Former    Current packs/day: 0.00    Average packs/day: 0.3 packs/day for 50.0 years (12.5 ttl pk-yrs)    Types: Cigarettes    Start date: 05/02/1968    Quit date: 05/03/2018    Years since quitting: 4.9   Smokeless tobacco: Former   Tobacco comments:    pt quit smoking  Vaping Use   Vaping status: Never Used  Substance and Sexual Activity   Alcohol use: Yes    Comment: Occasional -1x/month   Drug use: No   Sexual activity: Yes  Other Topics Concern   Not on file  Social  History Narrative   Not on file   Social Drivers of Health   Financial Resource Strain: Low Risk  (04/28/2023)   Overall Financial Resource Strain (CARDIA)    Difficulty of Paying Living Expenses: Not hard at all  Food Insecurity: No Food Insecurity (04/28/2023)   Hunger Vital Sign    Worried About Running Out of Food in the Last Year: Never true    Ran Out of Food in the Last Year: Never true  Transportation Needs: No Transportation Needs (04/28/2023)   PRAPARE - Administrator, Civil Service (Medical): No    Lack of Transportation (Non-Medical): No  Physical Activity: Sufficiently Active (04/28/2023)   Exercise Vital Sign    Days of Exercise per Week: 3 days    Minutes of Exercise per Session: 60 min  Stress: No Stress Concern Present (04/28/2023)   [employer] of Occupational Health - Occupational Stress Questionnaire    Feeling of Stress : Not at all  Social  Connections: Moderately Integrated (04/28/2023)   Social Connection and Isolation Panel [NHANES]    Frequency of Communication with Friends and Family: More than three times a week    Frequency of Social Gatherings with Friends and Family: Once a week    Attends Religious Services: More than 4 times per year    Active Member of [employer] or Organizations: No    Attends Engineer, structural: Never    Marital Status: Married    Tobacco Counseling Counseling given: Not Answered Tobacco comments: pt quit smoking    Clinical Intake:  Pre-visit preparation completed: Yes  Pain : No/denies pain Pain Score: 0-No pain     BMI - recorded: 36.6 Nutritional Status: BMI > 30  Obese Nutritional Risks: None Diabetes: No  Lab Results  Component Value Date   HGBA1C 4.4 (L) 07/17/2022   HGBA1C 5.8 (H) 08/30/2014     How often do you need to have someone help you when you read instructions, pamphlets, or other written materials from your doctor or pharmacy?: 1 - Never  Interpreter Needed?: No  Information entered by :: Kennedy Bucker, LPN   Activities of Daily Living     04/28/2023    2:06 PM 07/16/2022    6:00 PM  In your present state of health, do you have any difficulty performing the following activities:  Hearing? 1 0  Vision? 0 0  Difficulty concentrating or making decisions? 0 0  Walking or climbing stairs? 0 0  Dressing or bathing? 0 0  Doing errands, shopping? 0 0  Preparing Food and eating ? N   Using the Toilet? N   In the past six months, have you accidently leaked urine? N   Do you have problems with loss of bowel control? N   Managing your Medications? N   Managing your Finances? N   Housekeeping or managing your Housekeeping? N     Patient Care Team: Duanne Limerick, MD as PCP - General (Family Medicine) Creig Hines, MD as Consulting Physician (Oncology)  Indicate any recent Medical Services you may have received from other than [facility] providers in the  past year (date may be approximate).     Assessment:   This is a routine wellness examination for [place].  Hearing/Vision screen Hearing Screening - Comments:: WEARS AID BOTH EARS Vision Screening - Comments:: NO GLASSES, CATARACT SGY W/ IMPLANTED LENS-    Goals Addressed             This  Visit's Progress    DIET - INCREASE WATER INTAKE         Depression Screen     04/28/2023    2:03 PM 03/05/2023   11:00 AM 03/04/2023   11:09 AM 11/23/2022   10:20 AM 11/18/2022    9:31 AM 07/31/2022    7:54 AM 07/21/2022    1:29 PM  PHQ 2/9 Scores  PHQ - 2 Score 0 0 0 0 0 0 0  PHQ- 9 Score 0 3 3 0 0 0 0    Fall Risk     04/28/2023    2:06 PM 03/05/2023   11:00 AM 03/04/2023   11:09 AM 11/23/2022   10:20 AM 11/18/2022    9:31 AM  Fall Risk   Falls in the past year? 0 0 0 0 0  Number falls in past yr: 0 0 0 0 0  Injury with Fall? 0 0 0 0 0  Risk for fall due to : No Fall Risks No Fall Risks No Fall Risks No Fall Risks No Fall Risks  Follow up Falls prevention discussed;Falls evaluation completed Falls evaluation completed Falls evaluation completed Falls evaluation completed Falls evaluation completed    MEDICARE RISK AT HOME:  Medicare Risk at Home Any stairs in or around the home?: Yes If so, are there any without handrails?: Yes Home free of loose throw rugs in walkways, pet beds, electrical cords, etc?: Yes Adequate lighting in your home to reduce risk of falls?: Yes Life alert?: No Use of a cane, walker or w/c?: No Grab bars in the bathroom?: Yes Shower chair or bench in shower?: No (HAS SHOWER SEAT) Elevated toilet seat or a handicapped toilet?: No  TIMED UP AND GO:  Was the test performed?  No  Cognitive Function: 6CIT completed        04/28/2023    2:08 PM 03/12/2022    2:10 PM 12/14/2016    8:05 AM  6CIT Screen  What Year? 0 points 0 points 0 points  What month? 0 points 0 points 0 points  What time? 0 points 0 points 0 points  Count back from 20 0 points 0  points 0 points  Months in reverse 2 points 0 points 0 points  Repeat phrase 0 points 0 points 0 points  Total Score 2 points 0 points 0 points    Immunizations Immunization History  Administered Date(s) Administered   Fluad Quad(high Dose 65+) 11/14/2018, 11/21/2019   Influenza Split 11/25/2021   Influenza, High Dose Seasonal PF 12/14/2016, 12/15/2017   Influenza,inj,Quad PF,6+ Mos 10/22/2015   Influenza-Unspecified 02/28/2023   Moderna Sars-Covid-2 Vaccination 01/04/2020   PFIZER(Purple Top)SARS-COV-2 Vaccination 03/08/2019, 03/29/2019   Pneumococcal Conjugate-13 12/14/2016   Pneumococcal Polysaccharide-23 08/30/2014   Tdap 08/30/2014    Screening Tests Health Maintenance  Topic Date Due   Zoster Vaccines- Shingrix (1 of 2) Never done   COVID-19 Vaccine (4 - 2024-25 season) 09/27/2022   Colonoscopy  10/12/2022   INFLUENZA VACCINE  08/27/2023   Medicare Annual Wellness (AWV)  04/27/2024   DTaP/Tdap/Td (2 - Td or Tdap) 08/29/2024   Pneumonia Vaccine 49+ Years old  Completed   Hepatitis C Screening  Completed   HPV VACCINES  Aged Out    Health Maintenance  Health Maintenance Due  Topic Date Due   Zoster Vaccines- Shingrix (1 of 2) Never done   COVID-19 Vaccine (4 - 2024-25 season) 09/27/2022   Colonoscopy  10/12/2022   Health Maintenance Items Addressed: UP TO DATE ON  SHOTS, EXCEPT SHINGRIX - UP TO DATE ON COLONOSCOPY  Additional Screening:  Vision Screening: Recommended annual ophthalmology exams for early detection of glaucoma and other disorders of the eye.  Dental Screening: Recommended annual dental exams for proper oral hygiene  Community Resource Referral / Chronic Care Management: CRR required this visit?  No   CCM required this visit?  No     Plan:     I have personally reviewed and noted the following in the patient's chart:   Medical and social history Use of alcohol, tobacco or illicit drugs  Current medications and supplements including  opioid prescriptions. Patient is not currently taking opioid prescriptions. Functional ability and status Nutritional status Physical activity Advanced directives List of other physicians Hospitalizations, surgeries, and ER visits in previous 12 months Vitals Screenings to include cognitive, depression, and falls Referrals and appointments  In addition, I have reviewed and discussed with patient certain preventive protocols, quality metrics, and best practice recommendations. A written personalized care plan for preventive services as well as general preventive health recommendations were provided to patient.     Hal Hope, LPN   04/28/3293   After Visit Summary: (MyChart) Due to this being a telephonic visit, the after visit summary with patients personalized plan was offered to patient via MyChart   Notes: Nothing significant to report at this time.

## 2023-04-29 ENCOUNTER — Other Ambulatory Visit: Payer: Self-pay

## 2023-04-30 ENCOUNTER — Inpatient Hospital Stay: Payer: Medicare Other

## 2023-05-05 ENCOUNTER — Encounter: Payer: Self-pay | Admitting: Oncology

## 2023-05-12 ENCOUNTER — Inpatient Hospital Stay

## 2023-05-13 ENCOUNTER — Inpatient Hospital Stay

## 2023-05-13 VITALS — BP 124/54

## 2023-05-13 DIAGNOSIS — D649 Anemia, unspecified: Secondary | ICD-10-CM

## 2023-05-13 DIAGNOSIS — D461 Refractory anemia with ring sideroblasts: Secondary | ICD-10-CM

## 2023-05-13 LAB — HEMOGLOBIN AND HEMATOCRIT (CANCER CENTER ONLY)
HCT: 28.3 % — ABNORMAL LOW (ref 39.0–52.0)
Hemoglobin: 8.8 g/dL — ABNORMAL LOW (ref 13.0–17.0)

## 2023-05-13 MED ORDER — DARBEPOETIN ALFA 300 MCG/0.6ML IJ SOSY
300.0000 ug | PREFILLED_SYRINGE | Freq: Once | INTRAMUSCULAR | Status: AC
  Administered 2023-05-13: 300 ug via SUBCUTANEOUS
  Filled 2023-05-13: qty 0.6

## 2023-05-26 ENCOUNTER — Other Ambulatory Visit: Payer: Self-pay | Admitting: Oncology

## 2023-05-26 ENCOUNTER — Inpatient Hospital Stay

## 2023-05-26 VITALS — BP 138/61

## 2023-05-26 DIAGNOSIS — D461 Refractory anemia with ring sideroblasts: Secondary | ICD-10-CM | POA: Diagnosis not present

## 2023-05-26 DIAGNOSIS — Z79899 Other long term (current) drug therapy: Secondary | ICD-10-CM

## 2023-05-26 LAB — HEMOGLOBIN AND HEMATOCRIT (CANCER CENTER ONLY)
HCT: 25.5 % — ABNORMAL LOW (ref 39.0–52.0)
Hemoglobin: 8.2 g/dL — ABNORMAL LOW (ref 13.0–17.0)

## 2023-05-26 MED ORDER — DARBEPOETIN ALFA 300 MCG/0.6ML IJ SOSY
300.0000 ug | PREFILLED_SYRINGE | Freq: Once | INTRAMUSCULAR | Status: AC
  Administered 2023-05-26: 300 ug via SUBCUTANEOUS
  Filled 2023-05-26: qty 0.6

## 2023-06-09 ENCOUNTER — Inpatient Hospital Stay

## 2023-06-09 ENCOUNTER — Inpatient Hospital Stay: Attending: Nurse Practitioner

## 2023-06-09 VITALS — BP 147/63

## 2023-06-09 DIAGNOSIS — D461 Refractory anemia with ring sideroblasts: Secondary | ICD-10-CM

## 2023-06-09 DIAGNOSIS — D649 Anemia, unspecified: Secondary | ICD-10-CM

## 2023-06-09 LAB — HEMOGLOBIN AND HEMATOCRIT (CANCER CENTER ONLY)
HCT: 26.9 % — ABNORMAL LOW (ref 39.0–52.0)
Hemoglobin: 8.6 g/dL — ABNORMAL LOW (ref 13.0–17.0)

## 2023-06-09 MED ORDER — DARBEPOETIN ALFA 500 MCG/ML IJ SOSY
500.0000 ug | PREFILLED_SYRINGE | Freq: Once | INTRAMUSCULAR | Status: AC
  Administered 2023-06-09: 500 ug via SUBCUTANEOUS
  Filled 2023-06-09: qty 1

## 2023-06-15 ENCOUNTER — Encounter (INDEPENDENT_AMBULATORY_CARE_PROVIDER_SITE_OTHER): Payer: Self-pay

## 2023-06-16 ENCOUNTER — Ambulatory Visit
Admission: RE | Admit: 2023-06-16 | Discharge: 2023-06-16 | Disposition: A | Discharge status: Home / Self Care | Source: Ambulatory Visit | Attending: Oncology | Admitting: Oncology

## 2023-06-16 DIAGNOSIS — I700 Atherosclerosis of aorta: Secondary | ICD-10-CM | POA: Diagnosis not present

## 2023-06-16 DIAGNOSIS — D649 Anemia, unspecified: Secondary | ICD-10-CM | POA: Insufficient documentation

## 2023-06-16 DIAGNOSIS — Z79899 Other long term (current) drug therapy: Secondary | ICD-10-CM | POA: Insufficient documentation

## 2023-06-16 DIAGNOSIS — D461 Refractory anemia with ring sideroblasts: Secondary | ICD-10-CM | POA: Diagnosis not present

## 2023-06-16 DIAGNOSIS — R161 Splenomegaly, not elsewhere classified: Secondary | ICD-10-CM | POA: Diagnosis not present

## 2023-06-16 DIAGNOSIS — K760 Fatty (change of) liver, not elsewhere classified: Secondary | ICD-10-CM | POA: Diagnosis not present

## 2023-06-16 DIAGNOSIS — D469 Myelodysplastic syndrome, unspecified: Secondary | ICD-10-CM | POA: Diagnosis not present

## 2023-06-16 MED ORDER — GADOBUTROL 1 MMOL/ML IV SOLN
10.0000 mL | Freq: Once | INTRAVENOUS | Status: AC | PRN
Start: 2023-06-16 — End: 2023-06-16
  Administered 2023-06-16: 10 mL via INTRAVENOUS

## 2023-06-23 ENCOUNTER — Inpatient Hospital Stay

## 2023-06-23 VITALS — BP 115/71

## 2023-06-23 DIAGNOSIS — D461 Refractory anemia with ring sideroblasts: Secondary | ICD-10-CM | POA: Diagnosis not present

## 2023-06-23 DIAGNOSIS — D649 Anemia, unspecified: Secondary | ICD-10-CM

## 2023-06-23 LAB — HEMOGLOBIN AND HEMATOCRIT (CANCER CENTER ONLY)
HCT: 26.3 % — ABNORMAL LOW (ref 39.0–52.0)
Hemoglobin: 8.4 g/dL — ABNORMAL LOW (ref 13.0–17.0)

## 2023-06-23 MED ORDER — DARBEPOETIN ALFA 500 MCG/ML IJ SOSY
500.0000 ug | PREFILLED_SYRINGE | Freq: Once | INTRAMUSCULAR | Status: AC
  Administered 2023-06-23: 500 ug via SUBCUTANEOUS
  Filled 2023-06-23: qty 1

## 2023-07-07 ENCOUNTER — Inpatient Hospital Stay

## 2023-07-07 ENCOUNTER — Inpatient Hospital Stay: Attending: Nurse Practitioner

## 2023-07-07 VITALS — BP 137/67

## 2023-07-07 DIAGNOSIS — Z79899 Other long term (current) drug therapy: Secondary | ICD-10-CM

## 2023-07-07 DIAGNOSIS — D461 Refractory anemia with ring sideroblasts: Secondary | ICD-10-CM | POA: Insufficient documentation

## 2023-07-07 LAB — HEMOGLOBIN AND HEMATOCRIT (CANCER CENTER ONLY)
HCT: 25.3 % — ABNORMAL LOW (ref 39.0–52.0)
Hemoglobin: 8.1 g/dL — ABNORMAL LOW (ref 13.0–17.0)

## 2023-07-07 MED ORDER — DARBEPOETIN ALFA 500 MCG/ML IJ SOSY
500.0000 ug | PREFILLED_SYRINGE | Freq: Once | INTRAMUSCULAR | Status: AC
  Administered 2023-07-07: 500 ug via SUBCUTANEOUS
  Filled 2023-07-07: qty 1

## 2023-07-21 ENCOUNTER — Inpatient Hospital Stay

## 2023-07-21 ENCOUNTER — Other Ambulatory Visit (HOSPITAL_COMMUNITY): Payer: Self-pay

## 2023-07-21 ENCOUNTER — Inpatient Hospital Stay: Admitting: Oncology

## 2023-07-21 ENCOUNTER — Encounter: Payer: Self-pay | Admitting: Oncology

## 2023-07-21 VITALS — BP 121/58 | HR 61 | Temp 98.8°F | Resp 19 | Ht 70.0 in | Wt 252.9 lb

## 2023-07-21 DIAGNOSIS — D461 Refractory anemia with ring sideroblasts: Secondary | ICD-10-CM | POA: Diagnosis not present

## 2023-07-21 DIAGNOSIS — Z79899 Other long term (current) drug therapy: Secondary | ICD-10-CM | POA: Diagnosis not present

## 2023-07-21 DIAGNOSIS — D649 Anemia, unspecified: Secondary | ICD-10-CM

## 2023-07-21 LAB — CBC WITH DIFFERENTIAL (CANCER CENTER ONLY)
Abs Immature Granulocytes: 0.02 10*3/uL (ref 0.00–0.07)
Basophils Absolute: 0 10*3/uL (ref 0.0–0.1)
Basophils Relative: 1 %
Eosinophils Absolute: 0.1 10*3/uL (ref 0.0–0.5)
Eosinophils Relative: 1 %
HCT: 26.3 % — ABNORMAL LOW (ref 39.0–52.0)
Hemoglobin: 8.4 g/dL — ABNORMAL LOW (ref 13.0–17.0)
Immature Granulocytes: 0 %
Lymphocytes Relative: 20 %
Lymphs Abs: 1.1 10*3/uL (ref 0.7–4.0)
MCH: 32.9 pg (ref 26.0–34.0)
MCHC: 31.9 g/dL (ref 30.0–36.0)
MCV: 103.1 fL — ABNORMAL HIGH (ref 80.0–100.0)
Monocytes Absolute: 0.5 10*3/uL (ref 0.1–1.0)
Monocytes Relative: 8 %
Neutro Abs: 4 10*3/uL (ref 1.7–7.7)
Neutrophils Relative %: 70 %
Platelet Count: 289 10*3/uL (ref 150–400)
RBC: 2.55 MIL/uL — ABNORMAL LOW (ref 4.22–5.81)
RDW: 25.3 % — ABNORMAL HIGH (ref 11.5–15.5)
WBC Count: 5.7 10*3/uL (ref 4.0–10.5)
nRBC: 0.7 % — ABNORMAL HIGH (ref 0.0–0.2)

## 2023-07-21 LAB — CMP (CANCER CENTER ONLY)
ALT: 17 U/L (ref 0–44)
AST: 23 U/L (ref 15–41)
Albumin: 4 g/dL (ref 3.5–5.0)
Alkaline Phosphatase: 42 U/L (ref 38–126)
Anion gap: 5 (ref 5–15)
BUN: 30 mg/dL — ABNORMAL HIGH (ref 8–23)
CO2: 23 mmol/L (ref 22–32)
Calcium: 8.7 mg/dL — ABNORMAL LOW (ref 8.9–10.3)
Chloride: 108 mmol/L (ref 98–111)
Creatinine: 1.35 mg/dL — ABNORMAL HIGH (ref 0.61–1.24)
GFR, Estimated: 54 mL/min — ABNORMAL LOW (ref >60)
Glucose, Bld: 141 mg/dL — ABNORMAL HIGH (ref 70–99)
Potassium: 4.3 mmol/L (ref 3.5–5.1)
Sodium: 136 mmol/L (ref 135–145)
Total Bilirubin: 1.7 mg/dL — ABNORMAL HIGH (ref 0.0–1.2)
Total Protein: 6.2 g/dL — ABNORMAL LOW (ref 6.5–8.1)

## 2023-07-21 LAB — IRON AND TIBC
Iron: 199 ug/dL — ABNORMAL HIGH (ref 45–182)
Saturation Ratios: 87 % — ABNORMAL HIGH (ref 17.9–39.5)
TIBC: 230 ug/dL — ABNORMAL LOW (ref 250–450)
UIBC: 31 ug/dL

## 2023-07-21 LAB — FERRITIN: Ferritin: 719 ng/mL — ABNORMAL HIGH (ref 24–336)

## 2023-07-21 MED ORDER — DEFERASIROX 500 MG PO TBSO: 20.0000 mg/kg | ORAL_TABLET | Freq: Every day | ORAL | 0 refills | Status: DC

## 2023-07-21 MED ORDER — DARBEPOETIN ALFA 500 MCG/ML IJ SOSY: 500.0000 ug | PREFILLED_SYRINGE | Freq: Once | INTRAMUSCULAR | Status: AC

## 2023-07-21 MED ORDER — LUSPATERCEPT-AAMT 75 MG ~~LOC~~ SOLR
1.0000 mg/kg | Freq: Once | SUBCUTANEOUS | Status: AC
  Administered 2023-07-21: 120 mg via SUBCUTANEOUS
  Filled 2023-07-21: qty 1.5

## 2023-07-23 ENCOUNTER — Ambulatory Visit: Admitting: Oncology

## 2023-07-23 ENCOUNTER — Ambulatory Visit

## 2023-07-23 ENCOUNTER — Other Ambulatory Visit

## 2023-07-24 ENCOUNTER — Encounter: Payer: Self-pay | Admitting: Oncology

## 2023-07-24 NOTE — Progress Notes (Signed)
 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 Bone marrow biopsy on 09/11/2019 revealed macrocytic anemia and hypercellular bone marrow for age with dyspoietic changes primarily involving the erythroid cell lines associated with abundant ring sideroblasts (> 15%).  There was no increase in blasts. Flow cytometry was negative. Cytogenetics revealed 45,X,-Y [20].  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 IPSS score is low.   Baseline EPO level was 63.3 in August 2021.  Patient received Aranesp  for a month in September 2021 and was then switched to Retacrit .  He was again switched to aranesp    As per insurance preference patient found to have elevated ferritin and when we was under the care of Dr. Rudell underwent hemochromatosis testing in July 2021 which showed single mutation for H63D.  This is being monitored conservatively.  MRI abdomen has shown diffuse iron deposition in the liver but liver enzymes have been normal.   MRI abdomen in September 2021 showed 1.3 cm lesion in the left kidney concerning for renal cell carcinoma for which he follows up with urology      Interval history- Patient has been feeling more fatigued lately. No other complaints. He has seen cardiology in the past for sinus tachycardia with PACs  ECOG PS- 1 Pain scale- 0   Review of systems- Review of Systems   Constitutional:  Positive for malaise/fatigue. Negative for chills, fever and weight loss.  HENT:  Negative for congestion, ear discharge and nosebleeds.   Eyes:  Negative for blurred vision.  Respiratory:  Negative for cough, hemoptysis, sputum production, shortness of breath and wheezing.   Cardiovascular:  Negative for chest pain, palpitations, orthopnea and claudication.  Gastrointestinal:  Negative for abdominal pain, blood in stool, constipation, diarrhea, heartburn, melena, nausea and vomiting.  Genitourinary:  Negative for dysuria, flank pain, frequency, hematuria and urgency.  Musculoskeletal:  Negative for back pain, joint pain and myalgias.  Skin:  Negative for rash.  Neurological:  Negative for dizziness, tingling, focal weakness, seizures, weakness and headaches.  Endo/Heme/Allergies:  Does not bruise/bleed easily.  Psychiatric/Behavioral:  Negative for depression and suicidal ideas. The patient does not have insomnia.       Allergies  Allergen Reactions   Amoxicillin-Pot Clavulanate Hives and Nausea And Vomiting     Past Medical History:  Diagnosis Date   Anemia    Heart murmur    Hydrocele    MDS (myelodysplastic syndrome) (HCC)    Rotator cuff tear arthropathy of right shoulder 08/01/2020   Venous stasis dermatitis of both lower extremities      Past Surgical History:  Procedure Laterality Date   CATARACT EXTRACTION W/PHACO Right 03/09/2017   Procedure: CATARACT EXTRACTION PHACO AND INTRAOCULAR LENS PLACEMENT (IOC) RIGHT;  Surgeon: Myrna Adine Anes, MD;  Location: [facility];  Service: Ophthalmology;  Laterality: Right;   CATARACT EXTRACTION W/PHACO Left 08/25/2021   Procedure: CATARACT EXTRACTION PHACO AND INTRAOCULAR LENS PLACEMENT (IOC) LEFT;  Surgeon: Myrna Adine Anes, MD;  Location: [facility];  Service: Ophthalmology;  Laterality: Left;   CATARACT EXTRACTION W/PHACO Left 10/13/2021   Procedure: CATARACT EXTRACTION PHACO AND INTRAOCULAR  LENS PLACEMENT (IOC) LEFT;  Surgeon: Myrna Adine Anes, MD;  Location: [facility];  Service: Ophthalmology;  Laterality: Left;  8.12 0:52.0   COLONOSCOPY  2013   normal- cleared for 5 yrs- Dr Parley   COLONOSCOPY WITH PROPOFOL  N/A 02/17/2017   Procedure: COLONOSCOPY WITH PROPOFOL ;  Surgeon: [name], MD;  Location: [facility];  Service: Endoscopy;  Laterality: N/A;   COLONOSCOPY WITH PROPOFOL  N/A 10/12/2019   Procedure: COLONOSCOPY WITH BIOPSY;  Surgeon: [name], MD;  Location: [facility];  Service: Endoscopy;  Laterality: N/A;   EPIGASTRIC HERNIA REPAIR N/A 07/06/2014   Procedure: HERNIA REPAIR EPIGASTRIC ADULT;  Surgeon: [name], MD;  Location: [facility];  Service: General;  Laterality: N/A;   HERNIA REPAIR Bilateral    INSERTION OF MESH N/A 07/06/2014   Procedure: INSERTION OF MESH;  Surgeon: [name], MD;  Location: [facility];  Service: General;  Laterality: N/A;   IR RADIOLOGIST EVAL & MGMT  11/07/2019   IR RADIOLOGIST EVAL & MGMT  04/25/2020   IR RADIOLOGIST EVAL & MGMT  12/05/2020   IR RADIOLOGIST EVAL & MGMT  06/30/2021   POLYPECTOMY  02/17/2017   Procedure: POLYPECTOMY;  Surgeon: [name], MD;  Location: [facility];  Service: Endoscopy;;   POLYPECTOMY N/A 10/12/2019   Procedure: POLYPECTOMY;  Surgeon: [name], MD;  Location: [facility];  Service: Endoscopy;  Laterality: N/A;   SHOULDER ARTHROSCOPY Left 2008   SINUS EXPLORATION      Social History   Socioeconomic History   Marital status: Married    Spouse name: Not on file   Number of children: 3   Years of education: Not on file   Highest education level: Bachelor's degree (e.g., BA, AB, BS)  Occupational History   Occupation: Retired  Tobacco Use   Smoking status: Former    Current packs/day: 0.00    Average packs/day: 0.3 packs/day for 50.0 years (12.5 ttl pk-yrs)    Types: Cigarettes    Start date: 05/02/1968     Quit date: 05/03/2018    Years since quitting: 5.2   Smokeless tobacco: Former   Tobacco comments:    pt quit smoking  Vaping Use   Vaping status: Never Used  Substance and Sexual Activity   Alcohol use: Yes    Comment: Occasional -1x/month   Drug use: No   Sexual activity: Yes  Other Topics Concern   Not on file  Social History Narrative   Not on file   Social Drivers of Health   Financial Resource Strain: Low Risk  (04/28/2023)   Overall Financial Resource Strain (CARDIA)    Difficulty of Paying Living Expenses: Not hard at all  Food Insecurity: No Food Insecurity (04/28/2023)   Hunger Vital Sign    Worried About Running Out of Food in the Last Year: Never true    Ran Out of Food in the Last Year: Never true  Transportation Needs: No Transportation Needs (04/28/2023)   PRAPARE - Administrator, Civil Service (Medical): No    Lack of Transportation (Non-Medical): No  Physical Activity: Sufficiently Active (04/28/2023)   Exercise Vital Sign    Days of Exercise per Week: 3 days    Minutes of Exercise per Session: 60 min  Stress: No Stress Concern Present (04/28/2023)  [employer] of Occupational Health - Occupational Stress Questionnaire    Feeling of Stress : Not at all  Social Connections: Moderately Integrated (04/28/2023)   Social Connection and Isolation Panel    Frequency of Communication with Friends and Family: More than three times a week    Frequency of Social Gatherings with Friends and Family: Once a week    Attends Religious Services: More than 4 times per year    Active Member of [employer] or Organizations: No    Attends Banker Meetings: Never    Marital Status: Married  Catering manager Violence: Not At Risk (04/28/2023)   Humiliation, Afraid, Rape, and Kick questionnaire    Fear of Current or Ex-Partner: No    Emotionally Abused: No    Physically Abused: No    Sexually Abused: No    Family History  Problem Relation Age of Onset    Diabetes Father    Heart disease Father 10   Stroke Mother      Current Outpatient Medications:    acetaminophen  (TYLENOL ) 325 MG tablet, Take 650 mg by mouth every 6 (six) hours as needed., Disp: , Rfl:    albuterol  (VENTOLIN  HFA) 108 (90 Base) MCG/ACT inhaler, Inhale 2 puffs into the lungs every 4 (four) hours as needed for wheezing or shortness of breath., Disp: 8 g, Rfl: 2   tadalafil  (CIALIS ) 5 MG tablet, Take 1-4 tablets (5-20 mg total) by mouth daily as needed for erectile dysfunction., Disp: 90 tablet, Rfl: 6   budesonide -formoterol  (SYMBICORT ) 80-4.5 MCG/ACT inhaler, Inhale 2 puffs into the lungs 2 (two) times daily. (Patient not taking: Reported on 03/16/2023), Disp: 1 each, Rfl: 3   Calcium  Carb-Cholecalciferol 600-400 MG-UNIT TABS, Take by mouth. (Patient not taking: Reported on 07/21/2023), Disp: , Rfl:    Darbepoetin Alfa  (ARANESP ) 25 MCG/0.42ML SOSY injection, Inject 25 mcg into the skin., Disp: , Rfl:    doxycycline  (VIBRA -TABS) 100 MG tablet, Take 1 tablet (100 mg total) by mouth 2 (two) times daily. (Patient not taking: Reported on 03/16/2023), Disp: 20 tablet, Rfl: 0   fluticasone -salmeterol (ADVAIR) 100-50 MCG/ACT AEPB, Inhale 1 puff into the lungs 2 (two) times daily. (Patient not taking: Reported on 04/28/2023), Disp: 1 each, Rfl: 3   ondansetron  (ZOFRAN ) 4 MG tablet, Take 1 tablet (4 mg total) by mouth every 8 (eight) hours as needed for nausea or vomiting. (Patient not taking: Reported on 04/28/2023), Disp: 20 tablet, Rfl: 0   predniSONE  (DELTASONE ) 10 MG tablet, Take 1 tablet (10 mg total) by mouth daily with breakfast. (Patient not taking: Reported on 04/28/2023), Disp: 30 tablet, Rfl: 0   Spacer/Aero-Holding Chambers [facility]) DEVI, 1 Inhalation by Does not apply route every 6 (six) hours. (Patient not taking: Reported on 07/21/2023), Disp: 1 each, Rfl: DEVICE No current facility-administered medications for this visit.  Facility-Administered Medications Ordered in Other  Visits:    Darbepoetin Alfa  (ARANESP ) injection 500 mcg, 500 mcg, Subcutaneous, Once, Assata Juncaj C, MD   Darbepoetin Alfa  (ARANESP ) injection 600 mcg, 600 mcg, Subcutaneous, Q14 Days, [name], MD, 600 mcg at 03/13/22 1032   Darbepoetin Alfa  (ARANESP ) injection 600 mcg, 600 mcg, Subcutaneous, Q14 Days, [name], MD, 600 mcg at 06/12/22 1052  Physical exam:  Vitals:   07/21/23 1317  BP: (!) 121/58  Pulse: 61  Resp: 19  Temp: 98.8 F (37.1 C)  TempSrc: Tympanic  SpO2: 99%  Weight: 252 lb 14.4 oz (114.7 kg)  Height: 5' 10 (1.778 m)  Physical Exam  Cardiovascular:     Rate and Rhythm: Normal rate and regular rhythm.     Heart sounds: Normal heart sounds.  Pulmonary:     Effort: Pulmonary effort is normal.     Breath sounds: Normal breath sounds.  Abdominal:     General: Bowel sounds are normal.   Skin:    General: Skin is warm and dry.   Neurological:     Mental Status: He is alert and oriented to person, place, and time.      I have personally reviewed labs listed below:    Latest Ref Rng & Units 07/21/2023   12:41 PM  CMP  Glucose 70 - 99 mg/dL 858   BUN 8 - 23 mg/dL 30   Creatinine 9.38 - 1.24 mg/dL 8.64   Sodium 864 - 854 mmol/L 136   Potassium 3.5 - 5.1 mmol/L 4.3   Chloride 98 - 111 mmol/L 108   CO2 22 - 32 mmol/L 23   Calcium  8.9 - 10.3 mg/dL 8.7   Total Protein 6.5 - 8.1 g/dL 6.2   Total Bilirubin 0.0 - 1.2 mg/dL 1.7   Alkaline Phos 38 - 126 U/L 42   AST 15 - 41 U/L 23   ALT 0 - 44 U/L 17       Latest Ref Rng & Units 07/21/2023   12:41 PM  CBC  WBC 4.0 - 10.5 K/uL 5.7   Hemoglobin 13.0 - 17.0 g/dL 8.4   Hematocrit 60.9 - 52.0 % 26.3   Platelets 150 - 400 K/uL 289      Assessment and plan- Patient is a 78 y.o. 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  He is here for routine follow-up of anemia  Patient has been receiving Aranesp  at 600 mcg every other week.  Despite that his hemoglobin has been  mostly around 8.5 but over the last 1 to 2 months it has drifted down to 8.1.  Insurance also does not want him to get more than 500 mcg every other week.  I explained to the patient that previously he was responding to Aranesp  and keeping his hemoglobin around 10 but it does not seem to be working at this time and I would strongly recommend switching over from Aranesp  to luspatercept  at this time.  We will plan to start luspatercept  at 1 mg/kg every 3-week dosing.  Discussed risks and benefits of luspatercept  including all but not limited to nausea vomiting diarrhea, leg swelling abnormal LFTs.  Treatment will be given at a palliative intent.  Patient understands and agrees to proceed as planned.  Will plan to get insurance approval for luspatercept  and started next week.  Patient has been having chronically elevated ferritin which has gradually increased over the last 6 months.  I am concerned that this is secondary to ineffective erythropoiesis in the setting of myelodysplastic syndrome.  Iron overload can lead to complications such as abnormal LFTs as well as cardiac complications.  MRI liver in the past has shown evidence of iron deposition as well.  Last year patient had episodes of sinus tachycardia with PACs.  I would like to see how he responds to luspatercept  and if his ferritin levels come down after changing treatment.  If ferritin levels remain more than 500 despite switching to luspatercept  I will consider starting iron chelating agent such as Exjade  for him.  Labs in 3 weeks in 6 weeks.  I will see him back in  6 weeks   Visit Diagnosis 1. Refractory anemia with ring sideroblasts (HCC)   2. High risk medication use   3. Erythropoietin  (EPO) stimulating agent anemia management patient   4. Iron overload      Dr. Annah Skene, MD, MPH [facility] at [facility] [phone] 07/24/2023 5:23 PM

## 2023-08-04 ENCOUNTER — Inpatient Hospital Stay

## 2023-08-06 ENCOUNTER — Other Ambulatory Visit: Payer: Self-pay | Admitting: Interventional Radiology

## 2023-08-06 DIAGNOSIS — N2889 Other specified disorders of kidney and ureter: Secondary | ICD-10-CM

## 2023-08-11 ENCOUNTER — Inpatient Hospital Stay: Attending: Nurse Practitioner

## 2023-08-11 ENCOUNTER — Inpatient Hospital Stay

## 2023-08-11 VITALS — BP 102/60 | HR 77 | Temp 97.3°F | Resp 18 | Wt 251.8 lb

## 2023-08-11 DIAGNOSIS — D461 Refractory anemia with ring sideroblasts: Secondary | ICD-10-CM

## 2023-08-11 LAB — CBC WITH DIFFERENTIAL (CANCER CENTER ONLY)
Abs Immature Granulocytes: 0.04 K/uL (ref 0.00–0.07)
Basophils Absolute: 0.1 K/uL (ref 0.0–0.1)
Basophils Relative: 1 %
Eosinophils Absolute: 0.1 K/uL (ref 0.0–0.5)
Eosinophils Relative: 2 %
HCT: 28.8 % — ABNORMAL LOW (ref 39.0–52.0)
Hemoglobin: 9.2 g/dL — ABNORMAL LOW (ref 13.0–17.0)
Immature Granulocytes: 1 %
Lymphocytes Relative: 19 %
Lymphs Abs: 1.1 K/uL (ref 0.7–4.0)
MCH: 32.6 pg (ref 26.0–34.0)
MCHC: 31.9 g/dL (ref 30.0–36.0)
MCV: 102.1 fL — ABNORMAL HIGH (ref 80.0–100.0)
Monocytes Absolute: 0.5 K/uL (ref 0.1–1.0)
Monocytes Relative: 9 %
Neutro Abs: 3.9 K/uL (ref 1.7–7.7)
Neutrophils Relative %: 68 %
Platelet Count: 342 K/uL (ref 150–400)
RBC: 2.82 MIL/uL — ABNORMAL LOW (ref 4.22–5.81)
RDW: 25.2 % — ABNORMAL HIGH (ref 11.5–15.5)
WBC Count: 5.7 K/uL (ref 4.0–10.5)
nRBC: 1 % — ABNORMAL HIGH (ref 0.0–0.2)

## 2023-08-11 LAB — CMP (CANCER CENTER ONLY)
ALT: 21 U/L (ref 0–44)
AST: 28 U/L (ref 15–41)
Albumin: 4.1 g/dL (ref 3.5–5.0)
Alkaline Phosphatase: 47 U/L (ref 38–126)
Anion gap: 6 (ref 5–15)
BUN: 20 mg/dL (ref 8–23)
CO2: 22 mmol/L (ref 22–32)
Calcium: 8.5 mg/dL — ABNORMAL LOW (ref 8.9–10.3)
Chloride: 106 mmol/L (ref 98–111)
Creatinine: 1.37 mg/dL — ABNORMAL HIGH (ref 0.61–1.24)
GFR, Estimated: 53 mL/min — ABNORMAL LOW (ref >60)
Glucose, Bld: 144 mg/dL — ABNORMAL HIGH (ref 70–99)
Potassium: 4.4 mmol/L (ref 3.5–5.1)
Sodium: 134 mmol/L — ABNORMAL LOW (ref 135–145)
Total Bilirubin: 2 mg/dL — ABNORMAL HIGH (ref 0.0–1.2)
Total Protein: 6.2 g/dL — ABNORMAL LOW (ref 6.5–8.1)

## 2023-08-11 LAB — FERRITIN: Ferritin: 773 ng/mL — ABNORMAL HIGH (ref 24–336)

## 2023-08-11 MED ORDER — LUSPATERCEPT-AAMT 75 MG ~~LOC~~ SOLR
1.0000 mg/kg | Freq: Once | SUBCUTANEOUS | Status: AC
  Administered 2023-08-11: 120 mg via SUBCUTANEOUS
  Filled 2023-08-11: qty 1.5

## 2023-08-16 ENCOUNTER — Ambulatory Visit
Admission: RE | Admit: 2023-08-16 | Discharge: 2023-08-16 | Disposition: A | Discharge status: Home / Self Care | Source: Ambulatory Visit | Attending: Interventional Radiology | Admitting: Interventional Radiology

## 2023-08-16 DIAGNOSIS — N2889 Other specified disorders of kidney and ureter: Secondary | ICD-10-CM

## 2023-08-16 HISTORY — PX: IR RADIOLOGIST EVAL & MGMT: IMG5224

## 2023-08-16 NOTE — Progress Notes (Signed)
 Chief Complaint: Patient was consulted remotely today (TeleHealth) for follow up of a left renal mass.   History of Present Illness: Brett Boone is a 78 y.o. male  seen previously for opinion regarding possible ablation of a left anterior renal mass.  This mass showed little change between MRI studies on 10/16/2019, 04/12/2020, November 03, 2020 and 06/26/2021. Decision was made to continue active MRI surveillance. The lesion did show very slight growth by MRI on 06/23/22 when compared to prior scans, especially going back to 2021 and it was elected to perform at least an additional 1 year follow up scan. A follow up was performed on 06/16/23. He remains asymptomatic.  Past Medical History:  Diagnosis Date   Anemia    Heart murmur    Hydrocele    MDS (myelodysplastic syndrome) (HCC)    Rotator cuff tear arthropathy of right shoulder 08/01/2020   Venous stasis dermatitis of both lower extremities     Past Surgical History:  Procedure Laterality Date   CATARACT EXTRACTION W/PHACO Right 03/09/2017   Procedure: CATARACT EXTRACTION PHACO AND INTRAOCULAR LENS PLACEMENT (IOC) RIGHT;  Surgeon: Myrna Adine Anes, MD;  Location: [facility];  Service: Ophthalmology;  Laterality: Right;   CATARACT EXTRACTION W/PHACO Left 08/25/2021   Procedure: CATARACT EXTRACTION PHACO AND INTRAOCULAR LENS PLACEMENT (IOC) LEFT;  Surgeon: Myrna Adine Anes, MD;  Location: [facility];  Service: Ophthalmology;  Laterality: Left;   CATARACT EXTRACTION W/PHACO Left 10/13/2021   Procedure: CATARACT EXTRACTION PHACO AND INTRAOCULAR LENS PLACEMENT (IOC) LEFT;  Surgeon: Myrna Adine Anes, MD;  Location: [facility];  Service: Ophthalmology;  Laterality: Left;  8.12 0:52.0   COLONOSCOPY  2013   normal- cleared for 5 yrs- Dr Parley   COLONOSCOPY WITH PROPOFOL  N/A 02/17/2017   Procedure: COLONOSCOPY WITH PROPOFOL ;  Surgeon: [name], MD;  Location: [facility];  Service:  Endoscopy;  Laterality: N/A;   COLONOSCOPY WITH PROPOFOL  N/A 10/12/2019   Procedure: COLONOSCOPY WITH BIOPSY;  Surgeon: [name], MD;  Location: [facility];  Service: Endoscopy;  Laterality: N/A;   EPIGASTRIC HERNIA REPAIR N/A 07/06/2014   Procedure: HERNIA REPAIR EPIGASTRIC ADULT;  Surgeon: [name], MD;  Location: [facility];  Service: General;  Laterality: N/A;   HERNIA REPAIR Bilateral    INSERTION OF MESH N/A 07/06/2014   Procedure: INSERTION OF MESH;  Surgeon: [name], MD;  Location: [facility];  Service: General;  Laterality: N/A;   IR RADIOLOGIST EVAL & MGMT  11/07/2019   IR RADIOLOGIST EVAL & MGMT  04/25/2020   IR RADIOLOGIST EVAL & MGMT  12/05/2020   IR RADIOLOGIST EVAL & MGMT  06/30/2021   POLYPECTOMY  02/17/2017   Procedure: POLYPECTOMY;  Surgeon: [name], MD;  Location: [facility];  Service: Endoscopy;;   POLYPECTOMY N/A 10/12/2019   Procedure: POLYPECTOMY;  Surgeon: [name], MD;  Location: [facility];  Service: Endoscopy;  Laterality: N/A;   SHOULDER ARTHROSCOPY Left 2008   SINUS EXPLORATION      Allergies: Amoxicillin-pot clavulanate  Medications: Prior to Admission medications   Medication Sig Start Date End Date Taking? Authorizing Provider  acetaminophen  (TYLENOL ) 325 MG tablet Take 650 mg by mouth every 6 (six) hours as needed.    [provider]  albuterol  (VENTOLIN  HFA) 108 (90 Base) MCG/ACT inhaler Inhale 2 puffs into the lungs every 4 (four) hours as needed for wheezing or shortness of breath. 03/04/23   Joshua Cathryne BROCKS, MD  budesonide -formoterol  (SYMBICORT ) 80-4.5 MCG/ACT inhaler Inhale 2 puffs into the lungs 2 (two) times daily. Patient not taking: Reported on 03/16/2023 03/04/23   Joshua Cathryne BROCKS, MD  Calcium  Carb-Cholecalciferol 600-400 MG-UNIT TABS Take by mouth. Patient not taking: Reported on 07/21/2023    [provider]  Darbepoetin Alfa  (ARANESP ) 25 MCG/0.42ML SOSY injection  Inject 25 mcg into the skin.    [provider]  doxycycline  (VIBRA -TABS) 100 MG tablet Take 1 tablet (100 mg total) by mouth 2 (two) times daily. Patient not taking: Reported on 03/16/2023 03/04/23   Joshua Cathryne BROCKS, MD  fluticasone -salmeterol (ADVAIR) 100-50 MCG/ACT AEPB Inhale 1 puff into the lungs 2 (two) times daily. Patient not taking: Reported on 04/28/2023 03/05/23   Joshua Cathryne BROCKS, MD  ondansetron  (ZOFRAN ) 4 MG tablet Take 1 tablet (4 mg total) by mouth every 8 (eight) hours as needed for nausea or vomiting. Patient not taking: Reported on 04/28/2023 03/05/23   Joshua Cathryne BROCKS, MD  predniSONE  (DELTASONE ) 10 MG tablet Take 1 tablet (10 mg total) by mouth daily with breakfast. Patient not taking: Reported on 04/28/2023 03/04/23   Joshua Cathryne BROCKS, MD  Spacer/Aero-Holding Chambers [facility]) DEVI 1 Inhalation by Does not apply route every 6 (six) hours. Patient not taking: Reported on 07/21/2023 03/05/23   Joshua Cathryne BROCKS, MD  tadalafil  (CIALIS ) 5 MG tablet Take 1-4 tablets (5-20 mg total) by mouth daily as needed for erectile dysfunction. 09/01/22   Francisca Redell BROCKS, MD     Family History  Problem Relation Age of Onset   Diabetes Father    Heart disease Father 87   Stroke Mother     Social History   Socioeconomic History   Marital status: Married    Spouse name: Not on file   Number of children: 3   Years of education: Not on file   Highest education level: Bachelor's degree (e.g., BA, AB, BS)  Occupational History   Occupation: Retired  Tobacco Use   Smoking status: Former    Current packs/day: 0.00    Average packs/day: 0.3 packs/day for 50.0 years (12.5 ttl pk-yrs)    Types: Cigarettes    Start date: 05/02/1968    Quit date: 05/03/2018    Years since quitting: 5.2   Smokeless tobacco: Former   Tobacco comments:    pt quit smoking  Vaping Use   Vaping status: Never Used  Substance and Sexual Activity   Alcohol use: Yes    Comment: Occasional -1x/month   Drug use: No    Sexual activity: Yes  Other Topics Concern   Not on file  Social History Narrative   Not on file   Social Drivers of Health   Financial Resource Strain: Low Risk  (04/28/2023)   Overall Financial Resource Strain (CARDIA)    Difficulty of Paying Living Expenses: Not hard at all  Food Insecurity: No Food Insecurity (04/28/2023)   Hunger Vital Sign    Worried About Running Out of Food in the Last Year: Never true    Ran Out of Food in the Last Year: Never true  Transportation Needs: No Transportation Needs (04/28/2023)   PRAPARE - Administrator, Civil Service (Medical): No    Lack of Transportation (Non-Medical): No  Physical Activity: Sufficiently Active (04/28/2023)   Exercise Vital Sign    Days of Exercise per Week: 3 days    Minutes of Exercise per Session: 60 min  Stress: No Stress Concern Present (04/28/2023)   [employer]  of Occupational Health - Occupational Stress Questionnaire    Feeling of Stress : Not at all  Social Connections: Moderately Integrated (04/28/2023)   Social Connection and Isolation Panel    Frequency of Communication with Friends and Family: More than three times a week    Frequency of Social Gatherings with Friends and Family: Once a week    Attends Religious Services: More than 4 times per year    Active Member of [employer] or Organizations: No    Attends Banker Meetings: Never    Marital Status: Married    ECOG Status: 0 - Asymptomatic  Review of Systems  Constitutional: Negative.   Respiratory: Negative.    Cardiovascular: Negative.   Gastrointestinal: Negative.   Genitourinary: Negative.   Musculoskeletal: Negative.   Neurological: Negative.     Review of Systems: A 12 point ROS discussed and pertinent positives are indicated in the HPI above.  All other systems are negative.   Physical Exam No direct physical exam was performed (except for noted visual exam findings with Video Visits).   Vital Signs: There were no  vitals taken for this visit.  Imaging: No results found.  Labs:  CBC: Recent Labs    03/04/23 1202 03/17/23 0904 04/16/23 1258 04/28/23 1111 06/23/23 1106 07/07/23 1117 07/21/23 1241 08/11/23 1128  WBC 5.8  --  6.0  --   --   --  5.7 5.7  HGB 8.4*   < > 8.6*   < > 8.4* 8.1* 8.4* 9.2*  HCT 26.4*   < > 27.6*   < > 26.3* 25.3* 26.3* 28.8*  PLT 261  --  333  --   --   --  289 342   < > = values in this interval not displayed.    COAGS: No results for input(s): INR, APTT in the last 8760 hours.  BMP: Recent Labs    04/16/23 1258 04/28/23 1111 07/21/23 1241 08/11/23 1128  NA 138 136 136 134*  K 4.1 5.0 4.3 4.4  CL 106 103 108 106  CO2 25 26 23 22   GLUCOSE 123* 122* 141* 144*  BUN 24* 21 30* 20  CALCIUM  8.8* 8.9 8.7* 8.5*  CREATININE 1.35* 1.21 1.35* 1.37*  GFRNONAA 54* >60 54* 53*    LIVER FUNCTION TESTS: Recent Labs    04/16/23 1258 04/28/23 1111 07/21/23 1241 08/11/23 1128  BILITOT 2.1* 2.6* 1.7* 2.0*  AST 24 21 23 28   ALT 17 17 17 21   ALKPHOS 38 45 42 47  PROT 6.3* 6.5 6.2* 6.2*  ALBUMIN 3.9 4.0 4.0 4.1    Assessment and Plan:  I met with Mr. Gibbons and his wife over video conferencing and reviewed imaging. The most recent MRI in May was read as increase in the anterior left renal mass up to 1.8 x 1.4 cm. By my measurements, the lesion may actually measure up to 1.8 x 2.0 cm on the post-contrast images and by comparison measured up to 1.5 cm in 2022. Due to clear slow growth over time, I recommended that we consider percutaneous cryoablation of the lesion with attempted biopsy at that time. I discussed details of cryoablation with Mr. Abair and his wife and answered all of their questions. After discussion, he would like to proceed with cryoablation soon. He would prefer to schedule the procedure at [facility]. We will begin the scheduling and authorization process.  Electronically Signed: Marcey ONEIDA Moan 08/16/2023, 12:30 PM    I spent a total of  15 Minutes in remote  clinical consultation, greater than 50% of which was counseling/coordinating care for a left renal mass.    Visit type: Audio and video Product/process development scientist).   Alternative for in-person consultation at [facility], [address] [place], [place], [place]. This visit type was conducted due to national recommendations for restrictions regarding the COVID-19 Pandemic (e.g. social distancing).  This format is felt to be most appropriate for this patient at this time.  All issues noted in this document were discussed and addressed.

## 2023-08-18 ENCOUNTER — Inpatient Hospital Stay

## 2023-08-29 ENCOUNTER — Other Ambulatory Visit: Payer: Self-pay | Admitting: Oncology

## 2023-08-29 DIAGNOSIS — D461 Refractory anemia with ring sideroblasts: Secondary | ICD-10-CM

## 2023-08-31 ENCOUNTER — Encounter: Payer: Self-pay | Admitting: Oncology

## 2023-09-01 ENCOUNTER — Inpatient Hospital Stay (HOSPITAL_BASED_OUTPATIENT_CLINIC_OR_DEPARTMENT_OTHER): Admitting: Oncology

## 2023-09-01 ENCOUNTER — Inpatient Hospital Stay

## 2023-09-01 ENCOUNTER — Ambulatory Visit: Payer: Self-pay | Admitting: Urology

## 2023-09-01 ENCOUNTER — Inpatient Hospital Stay: Attending: Nurse Practitioner

## 2023-09-01 ENCOUNTER — Encounter: Payer: Self-pay | Admitting: Oncology

## 2023-09-01 VITALS — BP 124/86 | HR 63 | Temp 96.0°F | Resp 18 | Ht 70.0 in | Wt 252.9 lb

## 2023-09-01 DIAGNOSIS — D461 Refractory anemia with ring sideroblasts: Secondary | ICD-10-CM | POA: Diagnosis present

## 2023-09-01 DIAGNOSIS — D469 Myelodysplastic syndrome, unspecified: Secondary | ICD-10-CM

## 2023-09-01 DIAGNOSIS — Z5111 Encounter for antineoplastic chemotherapy: Secondary | ICD-10-CM | POA: Diagnosis not present

## 2023-09-01 LAB — CBC WITH DIFFERENTIAL (CANCER CENTER ONLY)
Abs Immature Granulocytes: 0.05 K/uL (ref 0.00–0.07)
Basophils Absolute: 0 K/uL (ref 0.0–0.1)
Basophils Relative: 1 %
Eosinophils Absolute: 0.2 K/uL (ref 0.0–0.5)
Eosinophils Relative: 4 %
HCT: 30 % — ABNORMAL LOW (ref 39.0–52.0)
Hemoglobin: 9.4 g/dL — ABNORMAL LOW (ref 13.0–17.0)
Immature Granulocytes: 1 %
Lymphocytes Relative: 20 %
Lymphs Abs: 1.1 K/uL (ref 0.7–4.0)
MCH: 32.2 pg (ref 26.0–34.0)
MCHC: 31.3 g/dL (ref 30.0–36.0)
MCV: 102.7 fL — ABNORMAL HIGH (ref 80.0–100.0)
Monocytes Absolute: 0.4 K/uL (ref 0.1–1.0)
Monocytes Relative: 8 %
Neutro Abs: 3.7 K/uL (ref 1.7–7.7)
Neutrophils Relative %: 66 %
Platelet Count: 299 K/uL (ref 150–400)
RBC: 2.92 MIL/uL — ABNORMAL LOW (ref 4.22–5.81)
RDW: 25.5 % — ABNORMAL HIGH (ref 11.5–15.5)
WBC Count: 5.5 K/uL (ref 4.0–10.5)
nRBC: 2.4 % — ABNORMAL HIGH (ref 0.0–0.2)

## 2023-09-01 LAB — CMP (CANCER CENTER ONLY)
ALT: 20 U/L (ref 0–44)
AST: 25 U/L (ref 15–41)
Albumin: 3.9 g/dL (ref 3.5–5.0)
Alkaline Phosphatase: 46 U/L (ref 38–126)
Anion gap: 7 (ref 5–15)
BUN: 20 mg/dL (ref 8–23)
CO2: 24 mmol/L (ref 22–32)
Calcium: 8.6 mg/dL — ABNORMAL LOW (ref 8.9–10.3)
Chloride: 104 mmol/L (ref 98–111)
Creatinine: 1.36 mg/dL — ABNORMAL HIGH (ref 0.61–1.24)
GFR, Estimated: 54 mL/min — ABNORMAL LOW (ref >60)
Glucose, Bld: 144 mg/dL — ABNORMAL HIGH (ref 70–99)
Potassium: 4.3 mmol/L (ref 3.5–5.1)
Sodium: 135 mmol/L (ref 135–145)
Total Bilirubin: 1.9 mg/dL — ABNORMAL HIGH (ref 0.0–1.2)
Total Protein: 6.1 g/dL — ABNORMAL LOW (ref 6.5–8.1)

## 2023-09-01 LAB — FERRITIN: Ferritin: 907 ng/mL — ABNORMAL HIGH (ref 24–336)

## 2023-09-01 MED ORDER — LUSPATERCEPT-AAMT 75 MG ~~LOC~~ SOLR
1.0000 mg/kg | Freq: Once | SUBCUTANEOUS | Status: AC
  Administered 2023-09-01: 120 mg via SUBCUTANEOUS
  Filled 2023-09-01: qty 1.5

## 2023-09-01 NOTE — Progress Notes (Signed)
 Patient states he's feeling better than he was and that his dizziness has subsided. No new or acute concerns.

## 2023-09-01 NOTE — Progress Notes (Signed)
 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 Bone marrow biopsy on 09/11/2019 revealed macrocytic anemia and hypercellular bone marrow for age with dyspoietic changes primarily involving the erythroid cell lines associated with abundant ring sideroblasts (> 15%).  There was no increase in blasts. Flow cytometry was negative. Cytogenetics revealed 45,X,-Y [20].  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 IPSS score is low.   Baseline EPO level was 63.3 in August 2021.  Patient received Aranesp  for a month in September 2021 and was then switched to Retacrit .  He was again switched to aranesp .  Patient had worsening anemia and despite an iron less than therefore was switched to luspatercept  in June 2025   As per insurance preference patient found to have elevated ferritin and when we was under the care of Dr. Rudell underwent hemochromatosis testing in July 2021 which showed single mutation for H63D.  This is being monitored conservatively.  MRI abdomen has shown diffuse iron deposition in the liver but liver enzymes have been normal.  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  Hemoglobin has improved on luspatercept  and patient reports improvement in his energy as well.  ECOG PS- 1 Pain scale- 0   Review of systems- Review of Systems  Constitutional:  Negative for chills, fever, malaise/fatigue and weight loss.  HENT:  Negative for congestion, ear discharge and nosebleeds.   Eyes:  Negative for blurred vision.  Respiratory:  Negative for cough, hemoptysis, sputum production, shortness of breath and wheezing.   Cardiovascular:  Negative for chest pain, palpitations, orthopnea and claudication.  Gastrointestinal:  Negative for abdominal pain, blood in stool, constipation, diarrhea, heartburn, melena, nausea and vomiting.  Genitourinary:  Negative for dysuria, flank pain, frequency, hematuria and urgency.  Musculoskeletal:  Negative for back pain, joint pain and myalgias.  Skin:  Negative for rash.  Neurological:  Negative for dizziness, tingling, focal weakness, seizures, weakness and headaches.  Endo/Heme/Allergies:  Does not bruise/bleed easily.  Psychiatric/Behavioral:  Negative for depression and suicidal ideas. The patient does not have insomnia.       Allergies  Allergen Reactions   Amoxicillin-Pot Clavulanate Hives and Nausea And Vomiting     Past Medical History:  Diagnosis Date   Anemia    Heart murmur    Hydrocele    MDS (myelodysplastic syndrome) (HCC)    Rotator cuff tear arthropathy of right shoulder 08/01/2020   Venous stasis dermatitis of both lower extremities      Past Surgical History:  Procedure Laterality Date   CATARACT EXTRACTION W/PHACO Right 03/09/2017   Procedure: CATARACT EXTRACTION PHACO AND INTRAOCULAR LENS PLACEMENT (IOC) RIGHT;  Surgeon: Myrna Adine Anes, MD;  Location: [facility];  Service: Ophthalmology;  Laterality: Right;   CATARACT EXTRACTION W/PHACO Left 08/25/2021   Procedure: CATARACT EXTRACTION PHACO AND INTRAOCULAR  LENS PLACEMENT (IOC) LEFT;  Surgeon: Myrna Adine Anes, MD;  Location: [facility];  Service: Ophthalmology;  Laterality: Left;   CATARACT EXTRACTION W/PHACO Left 10/13/2021   Procedure: CATARACT EXTRACTION PHACO AND INTRAOCULAR LENS PLACEMENT (IOC) LEFT;  Surgeon: Myrna Adine Anes, MD;  Location: [facility];  Service: Ophthalmology;  Laterality: Left;  8.12 0:52.0   COLONOSCOPY  2013   normal- cleared for 5 yrs- Dr Parley   COLONOSCOPY WITH PROPOFOL  N/A 02/17/2017   Procedure: COLONOSCOPY WITH PROPOFOL ;  Surgeon: [name], MD;  Location: [facility];  Service: Endoscopy;  Laterality: N/A;   COLONOSCOPY WITH PROPOFOL  N/A 10/12/2019   Procedure: COLONOSCOPY WITH BIOPSY;  Surgeon: [name], MD;  Location: [facility];  Service: Endoscopy;  Laterality: N/A;   EPIGASTRIC HERNIA REPAIR N/A 07/06/2014   Procedure: HERNIA REPAIR EPIGASTRIC ADULT;  Surgeon: [name], MD;  Location: [facility];  Service: General;  Laterality: N/A;   HERNIA REPAIR Bilateral    INSERTION OF MESH N/A 07/06/2014   Procedure: INSERTION OF MESH;  Surgeon: [name], MD;  Location: [facility];  Service: General;  Laterality: N/A;   IR RADIOLOGIST EVAL & MGMT  11/07/2019   IR RADIOLOGIST EVAL & MGMT  04/25/2020   IR RADIOLOGIST EVAL & MGMT  12/05/2020   IR RADIOLOGIST EVAL & MGMT  06/30/2021   IR RADIOLOGIST EVAL & MGMT  08/16/2023   POLYPECTOMY  02/17/2017   Procedure: POLYPECTOMY;  Surgeon: [name], MD;  Location: [facility];  Service: Endoscopy;;   POLYPECTOMY N/A 10/12/2019   Procedure: POLYPECTOMY;  Surgeon: [name], MD;  Location: [facility];  Service: Endoscopy;  Laterality: N/A;   SHOULDER ARTHROSCOPY Left 2008   SINUS EXPLORATION      Social History   Socioeconomic History   Marital status: Married    Spouse name: Not on file   Number of children: 3   Years of education: Not on file   Highest education  level: Bachelor's degree (e.g., BA, AB, BS)  Occupational History   Occupation: Retired  Tobacco Use   Smoking status: Former    Current packs/day: 0.00    Average packs/day: 0.3 packs/day for 50.0 years (12.5 ttl pk-yrs)    Types: Cigarettes    Start date: 05/02/1968    Quit date: 05/03/2018    Years since quitting: 5.3   Smokeless tobacco: Former   Tobacco comments:    pt quit smoking  Vaping Use   Vaping status: Never Used  Substance and Sexual Activity   Alcohol use: Yes    Comment: Occasional -1x/month   Drug use: No   Sexual activity: Yes  Other Topics Concern   Not on file  Social History Narrative   Not on file   Social Drivers of Health   Financial Resource Strain: Low Risk  (04/28/2023)   Overall Financial Resource Strain (CARDIA)    Difficulty of Paying Living Expenses: Not hard at all  Food Insecurity: No Food Insecurity (04/28/2023)   Hunger Vital Sign    Worried About Running Out of Food in the Last Year: Never true    Ran Out of Food in the Last Year: Never true  Transportation Needs: No Transportation Needs (04/28/2023)   PRAPARE - Administrator, Civil Service (Medical): No    Lack of Transportation (Non-Medical): No  Physical Activity: Sufficiently Active (04/28/2023)   Exercise Vital Sign    Days of Exercise per Week: 3 days    Minutes of Exercise per Session: 60 min  Stress: No Stress Concern Present (04/28/2023)   [employer] of Occupational Health - Occupational Stress Questionnaire    Feeling of Stress : Not at all  Social Connections: Moderately Integrated (04/28/2023)   Social Connection and Isolation Panel    Frequency of Communication with Friends and Family: More than three times a week    Frequency of Social Gatherings with Friends and Family: Once a week    Attends Religious Services: More than 4 times per year    Active Member of [employer] or Organizations: No    Attends Banker Meetings: Never    Marital Status: Married   Catering manager Violence: Not At Risk (04/28/2023)   Humiliation, Afraid, Rape, and Kick questionnaire    Fear of Current or Ex-Partner: No    Emotionally Abused: No    Physically Abused: No    Sexually Abused: No    Family History  Problem Relation Age of Onset   Diabetes Father    Heart disease Father 60   Stroke Mother      Current Outpatient Medications:    acetaminophen  (TYLENOL ) 325 MG tablet, Take 650 mg by mouth every 6 (six) hours as needed., Disp: , Rfl:    albuterol  (VENTOLIN  HFA) 108 (90 Base) MCG/ACT inhaler, Inhale 2 puffs into the lungs every 4 (four) hours as needed for wheezing or shortness of breath., Disp: 8 g, Rfl: 2   tadalafil  (CIALIS ) 5 MG tablet, Take 1-4 tablets (5-20 mg total) by mouth daily as needed for erectile dysfunction., Disp: 90 tablet, Rfl: 6   budesonide -formoterol  (SYMBICORT ) 80-4.5 MCG/ACT inhaler, Inhale 2 puffs into the lungs 2 (two) times daily. (Patient not taking: Reported on 03/16/2023), Disp: 1 each, Rfl: 3   Calcium  Carb-Cholecalciferol 600-400 MG-UNIT TABS, Take by mouth. (Patient not taking: Reported on 07/21/2023), Disp: , Rfl:    Darbepoetin Alfa  (ARANESP ) 25 MCG/0.42ML SOSY injection, Inject 25 mcg into the skin., Disp: , Rfl:    doxycycline  (VIBRA -TABS) 100 MG tablet, Take 1 tablet (100 mg total) by mouth 2 (two) times daily. (Patient not taking: Reported on 03/16/2023), Disp: 20 tablet, Rfl: 0   fluticasone -salmeterol (ADVAIR) 100-50 MCG/ACT AEPB, Inhale 1 puff into the lungs 2 (two) times daily. (Patient not taking: Reported on 04/28/2023), Disp: 1 each, Rfl: 3   ondansetron  (ZOFRAN ) 4 MG tablet, Take 1 tablet (4 mg total) by mouth every 8 (eight) hours as needed for nausea or vomiting. (Patient not taking: Reported on 04/28/2023), Disp: 20 tablet, Rfl: 0   predniSONE  (DELTASONE ) 10 MG tablet, Take 1 tablet (10 mg total) by mouth daily with breakfast. (Patient not taking: Reported on 04/28/2023), Disp: 30 tablet, Rfl: 0   Spacer/Aero-Holding  [facility]) DEVI, 1 Inhalation by Does not apply route every 6 (six) hours. (Patient not taking: Reported on 07/21/2023), Disp: 1 each, Rfl: DEVICE No current facility-administered medications for this visit.  Facility-Administered Medications Ordered in Other Visits:    Darbepoetin Alfa  (ARANESP ) injection 500 mcg, 500 mcg, Subcutaneous, Once, Brett Annah BROCKS, MD   Darbepoetin Alfa  (ARANESP ) injection 600 mcg, 600 mcg, Subcutaneous, Q14 Days, Brett Annah BROCKS, MD, 600 mcg at 03/13/22 1032   Darbepoetin Alfa  (ARANESP ) injection 600 mcg, 600 mcg, Subcutaneous, Q14 Days, Brett Annah BROCKS, MD, 600 mcg at 06/12/22 1052  Physical exam:  Vitals:  09/01/23 0940  BP: 124/86  Pulse: 63  Resp: 18  Temp: (!) 96 F (35.6 C)  TempSrc: Tympanic  SpO2: 100%  Weight: 252 lb 14.4 oz (114.7 kg)  Height: 5' 10 (1.778 m)   Physical Exam Cardiovascular:     Rate and Rhythm: Normal rate and regular rhythm.     Heart sounds: Normal heart sounds.  Pulmonary:     Effort: Pulmonary effort is normal.     Breath sounds: Normal breath sounds.  Abdominal:     General: Bowel sounds are normal.     Palpations: Abdomen is soft.  Skin:    General: Skin is warm and dry.  Neurological:     Mental Status: He is alert and oriented to person, place, and time.      I have personally reviewed labs listed below:    Latest Ref Rng & Units 09/01/2023    9:09 AM  CMP  Glucose 70 - 99 mg/dL 855   BUN 8 - 23 mg/dL 20   Creatinine 9.38 - 1.24 mg/dL 8.63   Sodium 864 - 854 mmol/L 135   Potassium 3.5 - 5.1 mmol/L 4.3   Chloride 98 - 111 mmol/L 104   CO2 22 - 32 mmol/L 24   Calcium  8.9 - 10.3 mg/dL 8.6   Total Protein 6.5 - 8.1 g/dL 6.1   Total Bilirubin 0.0 - 1.2 mg/dL 1.9   Alkaline Phos 38 - 126 U/L 46   AST 15 - 41 U/L 25   ALT 0 - 44 U/L 20       Latest Ref Rng & Units 09/01/2023    9:09 AM  CBC  WBC 4.0 - 10.5 K/uL 5.5   Hemoglobin 13.0 - 17.0 g/dL 9.4   Hematocrit 60.9 - 52.0 % 30.0    Platelets 150 - 400 K/uL 299    I have personally reviewed Radiology images listed below: No images are attached to the encounter.  IR Radiologist Eval & Mgmt Result Date: 08/16/2023 CLINICAL DATA:  IR clinic follow-up. EXAM: IR EVAL AND MANAGEMENT COMPARISON:  None Available. FINDINGS: See dictated note in Epic. IMPRESSION: See dictated note in Epic. Electronically Signed   By: Marcey Moan M.D.   On: 08/16/2023 12:53     Assessment and plan- Patient is a 78 y.o. 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  I will continue with present dose every 3 weeks and once his hemoglobin stabilizes I will consider stretching out luspatercept  dosing to every 4 to 6 weeks.  Iron overload: Likely secondary to ineffective erythropoiesis in the setting of MDS.  I will continue to monitor his ferritin every couple of months.  Hoping that with his improvement of anemia with luspatercept  we could see some improvement in his ferritin.  However if ferritin levels remain high I will consider adding iron chelating agent such as Desferal  Patient follows up with urology for left kidney lesion which had mildly grown in size on his recent MRI in May 2025.  He was seen by interventional radiology and will be undergoing cryoablation for the same soon   Visit Diagnosis 1. Myelodysplastic syndrome (HCC)   2. Encounter for antineoplastic chemotherapy   3. Iron overload      Dr. Annah Skene, MD, MPH [facility]  at [facility] [phone] 09/01/2023 2:12 PM

## 2023-09-02 ENCOUNTER — Ambulatory Visit (INDEPENDENT_AMBULATORY_CARE_PROVIDER_SITE_OTHER): Admitting: Urology

## 2023-09-02 VITALS — BP 154/84 | HR 76 | Ht 70.0 in | Wt 252.0 lb

## 2023-09-02 DIAGNOSIS — N2889 Other specified disorders of kidney and ureter: Secondary | ICD-10-CM | POA: Diagnosis not present

## 2023-09-02 DIAGNOSIS — R399 Unspecified symptoms and signs involving the genitourinary system: Secondary | ICD-10-CM | POA: Diagnosis not present

## 2023-09-02 DIAGNOSIS — Z125 Encounter for screening for malignant neoplasm of prostate: Secondary | ICD-10-CM | POA: Diagnosis not present

## 2023-09-02 DIAGNOSIS — N529 Male erectile dysfunction, unspecified: Secondary | ICD-10-CM

## 2023-09-02 MED ORDER — TADALAFIL 5 MG PO TABS: 5.0000 mg | ORAL_TABLET | Freq: Every day | ORAL | 6 refills | Status: AC

## 2023-09-02 NOTE — Progress Notes (Signed)
   09/02/2023 9:56 AM   Brett Boone Jan 17, 1946 [id number]  Reason for visit: Follow up left small renal mass, right hydrocele, ED, urinary frequency/nocturia, elevated PSA  HPI: 78 year old male I have been following for the above issues.    He has a ~1.5cm left upper pole subtly enhancing endophytic renal mass that is under active surveillance.  He was seen by Dr. Luverne in interventional radiology, and he initially felt this would be very challenging to ablate percutaneously and recommended active surveillance as well.  I personally viewed and interpreted the most recent MRI abdomen from May 2025 showing slight enlargement of the left-sided renal lesion measuring 1.8 cm from 1.5 cm previously.  I reviewed the notes from IR and they decided to move forward with biopsy and ablation.  Risk and benefits were discussed, as well as need for ongoing MRI monitoring.  Regarding his erectile dysfunction, he has increased the Cialis  dose up to 10 to 15 mg on demand which works better.  We discussed the max dose of 20 mg/day, refill provided.  He reports some new urinary symptoms with increased frequency during the day.  I recommended trying the Cialis  5 mg daily to help with mild BPH symptoms with an on-demand boost dose as needed.  He denies any changes in the right hydrocele, and we again reviewed options including observation or hydrocelectomy, and he would like to continue observation at this time.  He had a PSA by PCP in June 2023 that was mildly increased at 4.7 from 3.3 in 2018.  Reviewed reviewed the AUA guidelines that do not recommend routine screening in men over age 23.  PSA August 2024 was stable at 4.6 with 17% free indicating a less than 20% chance of prostate cancer and he opted to defer further evaluation with MRI or biopsy.  Risk and benefits discussed, with his other comorbidities would not recommend further PSA screening.   -Cialis  increased to 5mg  daily for BPH, with 5  to 15 mg boost dose for ED -Behavioral strategies discussed regarding urinary symptoms and nocturia, declined Flomax -No further PSA screening needed - Will proceed with biopsy and ablation of small renal mass with IR, they will continue with surveillance imaging - Observation for right hydrocele - RTC 1 year  Brett JAYSON Burnet, MD  [facility] [name], [address] [place], [place] [address] [phone]

## 2023-09-03 ENCOUNTER — Other Ambulatory Visit: Payer: Self-pay

## 2023-09-03 ENCOUNTER — Other Ambulatory Visit

## 2023-09-03 ENCOUNTER — Ambulatory Visit

## 2023-09-03 ENCOUNTER — Ambulatory Visit: Admitting: Oncology

## 2023-09-08 ENCOUNTER — Other Ambulatory Visit: Payer: Self-pay | Admitting: Interventional Radiology

## 2023-09-08 DIAGNOSIS — N2889 Other specified disorders of kidney and ureter: Secondary | ICD-10-CM

## 2023-09-10 ENCOUNTER — Other Ambulatory Visit: Payer: Self-pay

## 2023-09-10 ENCOUNTER — Other Ambulatory Visit: Payer: Self-pay | Admitting: Interventional Radiology

## 2023-09-10 ENCOUNTER — Encounter
Admission: RE | Admit: 2023-09-10 | Discharge: 2023-09-10 | Disposition: A | Discharge status: Home / Self Care | Source: Ambulatory Visit | Attending: Interventional Radiology | Admitting: Interventional Radiology

## 2023-09-10 DIAGNOSIS — Z01818 Encounter for other preprocedural examination: Secondary | ICD-10-CM

## 2023-09-10 DIAGNOSIS — N289 Disorder of kidney and ureter, unspecified: Secondary | ICD-10-CM

## 2023-09-10 DIAGNOSIS — D649 Anemia, unspecified: Secondary | ICD-10-CM

## 2023-09-10 DIAGNOSIS — I498 Other specified cardiac arrhythmias: Secondary | ICD-10-CM

## 2023-09-10 DIAGNOSIS — Z01812 Encounter for preprocedural laboratory examination: Secondary | ICD-10-CM

## 2023-09-10 DIAGNOSIS — R011 Cardiac murmur, unspecified: Secondary | ICD-10-CM

## 2023-09-10 DIAGNOSIS — I4719 Other supraventricular tachycardia: Secondary | ICD-10-CM

## 2023-09-10 HISTORY — DX: Unspecified osteoarthritis, unspecified site: M19.90

## 2023-09-10 NOTE — Patient Instructions (Addendum)
 Your procedure is scheduled on: 09/16/23 - Thursday  Report to the [facility] at 7:00 am or as instructed.    REMEMBER: Instructions that are not followed completely may result in serious medical risk, up to and including death; or upon the discretion of your surgeon and anesthesiologist your surgery may need to be rescheduled.  Do not eat food or drink any liquids after midnight the night before surgery.  No gum chewing or hard candies.  One week prior to surgery: Stop Anti-inflammatories (NSAIDS) such as Advil, Aleve, Ibuprofen, Motrin, Naproxen, Naprosyn and Aspirin based products such as Excedrin, Goody's Powder, BC Powder. You may continue to take Tylenol  if needed for pain up until the day of surgery.  Stop ANY OVER THE COUNTER supplements until after surgery : Calcium  Carb-Cholecalciferol   HOLD  beginning 08/19, tadalafil  (CIALIS ) for 2 days before procedure.   ON THE DAY OF SURGERY ONLY TAKE THESE MEDICATIONS WITH SIPS OF WATER :  none   No Alcohol for 24 hours before or after surgery.  No Smoking including e-cigarettes for 24 hours before surgery.  No chewable tobacco products for at least 6 hours before surgery.  No nicotine patches on the day of surgery.  Do not use any recreational drugs for at least a week (preferably 2 weeks) before your surgery.  Please be advised that the combination of cocaine and anesthesia may have negative outcomes, up to and including death. If you test positive for cocaine, your surgery will be cancelled.  On the morning of surgery brush your teeth with toothpaste and water , you may rinse your mouth with mouthwash if you wish. Do not swallow any toothpaste or mouthwash.  Use CHG Soap or wipes as directed on instruction sheet.  Do not wear jewelry, make-up, hairpins, clips or nail polish.  For welded (permanent) jewelry: bracelets, anklets, waist bands, etc.  Please have this removed prior to surgery.  If it is not removed,  there is a chance that hospital personnel will need to cut it off on the day of surgery.  Do not wear lotions, powders, or perfumes.   Do not shave body hair from the neck down 48 hours before surgery.  Contact lenses, hearing aids and dentures may not be worn into surgery.  Do not bring valuables to the hospital. [facility] is not responsible for any missing/lost belongings or valuables.   Notify your doctor if there is any change in your medical condition (cold, fever, infection).  Wear comfortable clothing (specific to your surgery type) to the hospital.  After surgery, you can help prevent lung complications by doing breathing exercises.  Take deep breaths and cough every 1-2 hours. Your doctor may order a device called an Incentive Spirometer to help you take deep breaths.  When coughing or sneezing, hold a pillow firmly against your incision with both hands. This is called "splinting." Doing this helps protect your incision. It also decreases belly discomfort.  If you are being admitted to the hospital overnight, leave your suitcase in the car. After surgery it may be brought to your room.  In case of increased patient census, it may be necessary for you, the patient, to continue your postoperative care in the Same Day Surgery department.  If you are being discharged the day of surgery, you will not be allowed to drive home. You will need a responsible individual to drive you home and stay with you for 24 hours after surgery.   If you are taking  public transportation, you will need to have a responsible individual with you.  Please call the Pre-admissions Testing Dept. at [phone] if you have any questions about these instructions.  Surgery Visitation Policy:  Patients having surgery or a procedure may have two visitors.  Children under the age of 67 must have an adult with them who is not the patient.  Inpatient Visitation:    Visiting hours are 7 a.m. to 8 p.m. Up  to four visitors are allowed at one time in a patient room. The visitors may rotate out with other people during the day.  One visitor age 31 or older may stay with the patient overnight and must be in the room by 8 p.m.   Merchandiser, retail to address health-related social needs:  [email or web address]     Preparing for Surgery with CHLORHEXIDINE  GLUCONATE (CHG) Soap  Chlorhexidine  Gluconate (CHG) Soap  o An antiseptic cleaner that kills germs and bonds with the skin to continue killing germs even after washing  o Used for showering the night before surgery and morning of surgery  Before surgery, you can play an important role by reducing the number of germs on your skin.  CHG (Chlorhexidine  gluconate) soap is an antiseptic cleanser which kills germs and bonds with the skin to continue killing germs even after washing.  Please do not use if you have an allergy to CHG or antibacterial soaps. If your skin becomes reddened/irritated stop using the CHG.  1. Shower the NIGHT BEFORE SURGERY and the MORNING OF SURGERY with CHG soap.  2. If you choose to wash your hair, wash your hair first as usual with your normal shampoo.  3. After shampooing, rinse your hair and body thoroughly to remove the shampoo.  4. Use CHG as you would any other liquid soap. You can apply CHG directly to the skin and wash gently with a scrungie or a clean washcloth.  5. Apply the CHG soap to your body only from the neck down. Do not use on open wounds or open sores. Avoid contact with your eyes, ears, mouth, and genitals (private parts). Wash face and genitals (private parts) with your normal soap.  6. Wash thoroughly, paying special attention to the area where your surgery will be performed.  7. Thoroughly rinse your body with warm water .  8. Do not shower/wash with your normal soap after using and rinsing off the CHG soap.  9. Pat yourself dry with a clean towel.  10. Wear clean  pajamas to bed the night before surgery.  12. Place clean sheets on your bed the night of your first shower and do not sleep with pets.  13. Shower again with the CHG soap on the day of surgery prior to arriving at the hospital.  14. Do not apply any deodorants/lotions/powders.  15. Please wear clean clothes to the hospital.

## 2023-09-14 ENCOUNTER — Ambulatory Visit (INDEPENDENT_AMBULATORY_CARE_PROVIDER_SITE_OTHER): Payer: Medicare Other | Admitting: Vascular Surgery

## 2023-09-14 ENCOUNTER — Encounter
Admission: RE | Admit: 2023-09-14 | Discharge: 2023-09-14 | Disposition: A | Discharge status: Home / Self Care | Source: Ambulatory Visit | Attending: Interventional Radiology | Admitting: Interventional Radiology

## 2023-09-14 VITALS — BP 137/88 | HR 75 | Ht 70.0 in | Wt 252.6 lb

## 2023-09-14 DIAGNOSIS — Z01812 Encounter for preprocedural laboratory examination: Secondary | ICD-10-CM | POA: Diagnosis not present

## 2023-09-14 DIAGNOSIS — I4719 Other supraventricular tachycardia: Secondary | ICD-10-CM | POA: Insufficient documentation

## 2023-09-14 DIAGNOSIS — I498 Other specified cardiac arrhythmias: Secondary | ICD-10-CM | POA: Diagnosis not present

## 2023-09-14 DIAGNOSIS — N289 Disorder of kidney and ureter, unspecified: Secondary | ICD-10-CM | POA: Insufficient documentation

## 2023-09-14 DIAGNOSIS — R011 Cardiac murmur, unspecified: Secondary | ICD-10-CM | POA: Insufficient documentation

## 2023-09-14 DIAGNOSIS — Z01818 Encounter for other preprocedural examination: Secondary | ICD-10-CM | POA: Diagnosis present

## 2023-09-14 DIAGNOSIS — I890 Lymphedema, not elsewhere classified: Secondary | ICD-10-CM

## 2023-09-14 DIAGNOSIS — I8311 Varicose veins of right lower extremity with inflammation: Secondary | ICD-10-CM | POA: Diagnosis not present

## 2023-09-14 DIAGNOSIS — I8312 Varicose veins of left lower extremity with inflammation: Secondary | ICD-10-CM | POA: Diagnosis not present

## 2023-09-14 LAB — BASIC METABOLIC PANEL WITH GFR
Anion gap: 9 (ref 5–15)
BUN: 24 mg/dL — ABNORMAL HIGH (ref 8–23)
CO2: 23 mmol/L (ref 22–32)
Calcium: 9 mg/dL (ref 8.9–10.3)
Chloride: 106 mmol/L (ref 98–111)
Creatinine, Ser: 1.18 mg/dL (ref 0.61–1.24)
GFR, Estimated: >60 mL/min (ref >60)
Glucose, Bld: 104 mg/dL — ABNORMAL HIGH (ref 70–99)
Potassium: 4.8 mmol/L (ref 3.5–5.1)
Sodium: 138 mmol/L (ref 135–145)

## 2023-09-14 LAB — TYPE AND SCREEN
ABO/RH(D): O POS
Antibody Screen: NEGATIVE

## 2023-09-14 LAB — CBC WITH DIFFERENTIAL/PLATELET
Abs Immature Granulocytes: 0.07 K/uL (ref 0.00–0.07)
Basophils Absolute: 0.1 K/uL (ref 0.0–0.1)
Basophils Relative: 1 %
Eosinophils Absolute: 0.1 K/uL (ref 0.0–0.5)
Eosinophils Relative: 1 %
HCT: 27.6 % — ABNORMAL LOW (ref 39.0–52.0)
Hemoglobin: 9.2 g/dL — ABNORMAL LOW (ref 13.0–17.0)
Immature Granulocytes: 1 %
Lymphocytes Relative: 17 %
Lymphs Abs: 1.1 K/uL (ref 0.7–4.0)
MCH: 33.3 pg (ref 26.0–34.0)
MCHC: 33.3 g/dL (ref 30.0–36.0)
MCV: 100 fL (ref 80.0–100.0)
Monocytes Absolute: 0.6 K/uL (ref 0.1–1.0)
Monocytes Relative: 10 %
Neutro Abs: 4.5 K/uL (ref 1.7–7.7)
Neutrophils Relative %: 70 %
Platelets: 273 K/uL (ref 150–400)
RBC: 2.76 MIL/uL — ABNORMAL LOW (ref 4.22–5.81)
RDW: 26.3 % — ABNORMAL HIGH (ref 11.5–15.5)
Smear Review: NORMAL
WBC: 6.4 K/uL (ref 4.0–10.5)
nRBC: 1.7 % — ABNORMAL HIGH (ref 0.0–0.2)

## 2023-09-14 LAB — URINALYSIS, COMPLETE (UACMP) WITH MICROSCOPIC
Bilirubin Urine: NEGATIVE
Glucose, UA: NEGATIVE mg/dL
Ketones, ur: NEGATIVE mg/dL
Leukocytes,Ua: NEGATIVE
Nitrite: NEGATIVE
Protein, ur: NEGATIVE mg/dL
Specific Gravity, Urine: 1.017 (ref 1.005–1.030)
Squamous Epithelial / HPF: 0 /HPF (ref 0–5)
pH: 5 (ref 5.0–8.0)

## 2023-09-14 LAB — PROTIME-INR
INR: 1.2 (ref 0.8–1.2)
Prothrombin Time: 15.7 s — ABNORMAL HIGH (ref 11.4–15.2)

## 2023-09-14 NOTE — Assessment & Plan Note (Signed)
 Although he has significant venous reflux, at this point conservative measures are controlling his symptoms quite well.  As such, I would not recommend any intervention and will continue to avoid laser ablation at this point.  Follow-up in 1 year.

## 2023-09-14 NOTE — Assessment & Plan Note (Signed)
 Continue compression, elevation, and the lymphedema pump at least daily.  Conservative measures are controlling his symptoms quite well.

## 2023-09-14 NOTE — Progress Notes (Signed)
 MRN : [id number]  Brett Boone is a 78 y.o. (07-Sep-1945) male who presents with chief complaint of  Chief Complaint  Patient presents with   Follow-up    6 month follow up  .  History of Present Illness: Patient returns today in follow up of his venous insufficiency.  His legs are doing quite well.  He still uses a lymphedema pump essentially every day and sometimes twice a day.  Compression socks are still used and he elevates his legs.  At this point, he is really having minimal swelling and no significant pain.  Current Outpatient Medications  Medication Sig Dispense Refill   acetaminophen  (TYLENOL ) 325 MG tablet Take 650 mg by mouth every 6 (six) hours as needed.     Calcium  Carb-Cholecalciferol 600-400 MG-UNIT TABS Take by mouth.     ibuprofen (ADVIL) 200 MG tablet Take 200 mg by mouth every 6 (six) hours as needed.     tadalafil  (CIALIS ) 5 MG tablet Take 1 tablet (5 mg total) by mouth daily. Ok to take additional 5-15mg  as needed 1 hour prior to sexual activity 90 tablet 6   LUSPATERCEPT -AAMT  Inject into the skin every 21 ( twenty-one) days.     No current facility-administered medications for this visit.   Facility-Administered Medications Ordered in Other Visits  Medication Dose Route Frequency Provider Last Rate Last Admin   Darbepoetin Alfa  (ARANESP ) injection 500 mcg  500 mcg Subcutaneous Once [name], [name], MD       Darbepoetin Alfa  (ARANESP ) injection 600 mcg  600 mcg Subcutaneous Q14 Days [name], [name], MD   600 mcg at 03/13/22 1032   Darbepoetin Alfa  (ARANESP ) injection 600 mcg  600 mcg Subcutaneous Q14 Days [name], MD   600 mcg at 06/12/22 1052    Past Medical History:  Diagnosis Date   Anemia    Arthritis    Heart murmur    Hydrocele    MDS (myelodysplastic syndrome) (HCC)    Rotator cuff tear arthropathy of right shoulder 08/01/2020   Venous stasis dermatitis of both lower extremities     Past Surgical History:  Procedure Laterality  Date   CATARACT EXTRACTION W/PHACO Right 03/09/2017   Procedure: CATARACT EXTRACTION PHACO AND INTRAOCULAR LENS PLACEMENT (IOC) RIGHT;  Surgeon: Myrna Adine Anes, MD;  Location: [facility];  Service: Ophthalmology;  Laterality: Right;   CATARACT EXTRACTION W/PHACO Left 08/25/2021   Procedure: CATARACT EXTRACTION PHACO AND INTRAOCULAR LENS PLACEMENT (IOC) LEFT;  Surgeon: Myrna Adine Anes, MD;  Location: [facility];  Service: Ophthalmology;  Laterality: Left;   CATARACT EXTRACTION W/PHACO Left 10/13/2021   Procedure: CATARACT EXTRACTION PHACO AND INTRAOCULAR LENS PLACEMENT (IOC) LEFT;  Surgeon: Myrna Adine Anes, MD;  Location: [facility];  Service: Ophthalmology;  Laterality: Left;  8.12 0:52.0   COLONOSCOPY  2013   normal- cleared for 5 yrs- Dr Parley   COLONOSCOPY WITH PROPOFOL  N/A 02/17/2017   Procedure: COLONOSCOPY WITH PROPOFOL ;  Surgeon: [name], MD;  Location: [facility];  Service: Endoscopy;  Laterality: N/A;   COLONOSCOPY WITH PROPOFOL  N/A 10/12/2019   Procedure: COLONOSCOPY WITH BIOPSY;  Surgeon: [name], MD;  Location: [facility];  Service: Endoscopy;  Laterality: N/A;   EPIGASTRIC HERNIA REPAIR N/A 07/06/2014   Procedure: HERNIA REPAIR EPIGASTRIC ADULT;  Surgeon: [name], MD;  Location: [facility] OR[facility];  Service: General;  Laterality: N/A;   HERNIA REPAIR Bilateral    INSERTION OF MESH N/A 07/06/2014  Procedure: INSERTION OF MESH;  Surgeon: [name], MD;  Location: [facility];  Service: General;  Laterality: N/A;   IR RADIOLOGIST EVAL & MGMT  11/07/2019   IR RADIOLOGIST EVAL & MGMT  04/25/2020   IR RADIOLOGIST EVAL & MGMT  12/05/2020   IR RADIOLOGIST EVAL & MGMT  06/30/2021   IR RADIOLOGIST EVAL & MGMT  08/16/2023   POLYPECTOMY  02/17/2017   Procedure: POLYPECTOMY;  Surgeon: [name], MD;  Location: [facility];  Service: Endoscopy;;   POLYPECTOMY N/A 10/12/2019   Procedure:  POLYPECTOMY;  Surgeon: [name], MD;  Location: [facility];  Service: Endoscopy;  Laterality: N/A;   SHOULDER ARTHROSCOPY Bilateral 2008   SINUS EXPLORATION       Social History   Tobacco Use   Smoking status: Former    Current packs/day: 0.00    Average packs/day: 0.3 packs/day for 50.0 years (12.5 ttl pk-yrs)    Types: Cigarettes    Start date: 05/02/1968    Quit date: 05/03/2018    Years since quitting: 5.3   Smokeless tobacco: Former   Tobacco comments:    pt quit smoking  Vaping Use   Vaping status: Never Used  Substance Use Topics   Alcohol use: Yes    Comment: Occasional -1x/month   Drug use: No      Family History  Problem Relation Age of Onset   Diabetes Father    Heart disease Father 46   Stroke Mother     Allergies  Allergen Reactions   Amoxicillin-Pot Clavulanate Hives and Nausea And Vomiting      REVIEW OF SYSTEMS (Negative unless checked)   Constitutional: [] Weight loss  [] Fever  [] Chills Cardiac: [] Chest pain   [] Chest pressure   [] Palpitations   [] Shortness of breath when laying flat   [] Shortness of breath at rest   [] Shortness of breath with exertion. Vascular:  [] Pain in legs with walking   [] Pain in legs at rest   [] Pain in legs when laying flat   [] Claudication   [] Pain in feet when walking  [] Pain in feet at rest  [] Pain in feet when laying flat   [] History of DVT   [] Phlebitis   [x] Swelling in legs   [] Varicose veins   [x] Non-healing ulcers Pulmonary:   [] Uses home oxygen   [] Productive cough   [] Hemoptysis   [] Wheeze  [] COPD   [] Asthma Neurologic:  [] Dizziness  [] Blackouts   [] Seizures   [] History of stroke   [] History of TIA  [] Aphasia   [] Temporary blindness   [] Dysphagia   [] Weakness or numbness in arms   [] Weakness or numbness in legs Musculoskeletal:  [] Arthritis   [] Joint swelling   [x] Joint pain   [] Low back pain Hematologic:  [] Easy bruising  [] Easy bleeding   [] Hypercoagulable state   [x] Anemic   [] Hepatitis Gastrointestinal:  [] Blood in stool   [] Vomiting blood  [] Gastroesophageal reflux/heartburn   [] Abdominal pain Genitourinary:  [] Chronic kidney disease   [] Difficult urination  [] Frequent urination  [] Burning with urination   [] Hematuria Skin:  [] Rashes   [x] Ulcers   [x] Wounds Psychological:  [] History of anxiety   []  History of major depression.  Physical Examination  BP 137/88   Pulse 75   Ht 5' 10 (1.778 m)   Wt 252 lb 9.6 oz (114.6 kg)   BMI 36.24 kg/m  Gen:  WD/WN, NAD Head: [place]/AT, No temporalis wasting. Ear/Nose/Throat: Hearing grossly intact, nares w/o erythema or drainage Eyes: Conjunctiva clear. Sclera non-icteric Neck: Supple.  Trachea midline Pulmonary:  Good air movement, no use of accessory muscles.  Cardiac: RRR, no JVD Vascular:  Vessel Right Left  Radial Palpable Palpable                          PT Palpable Palpable  DP Palpable Palpable   Gastrointestinal: soft, non-tender/non-distended. No guarding/reflex.  Musculoskeletal: M/S 5/5 throughout.  No deformity or atrophy.  Moderate stasis dermatitis changes to both lower extremities with hyperpigmentation and skin thickening.  No appreciable lower extremity edema. Neurologic: Sensation grossly intact in extremities.  Symmetrical.  Speech is fluent.  Psychiatric: Judgment intact, Mood & affect appropriate for pt's clinical situation. Dermatologic: No rashes or ulcers noted.  No cellulitis or open wounds.      Labs Recent Results (from the past 2160 hours)  Hemoglobin and Hematocrit (Cancer Center Only)     Status: Abnormal   Collection Time: 06/23/23 11:06 AM  Result Value Ref Range   Hemoglobin 8.4 (L) 13.0 - 17.0 g/dL   HCT 73.6 (L) 60.9 - 47.9 %    Comment: Performed at [facility], [address] [place], [place] [address]  Hemoglobin and Hematocrit (Cancer Center Only)     Status: Abnormal   Collection Time: 07/07/23 11:17 AM  Result Value Ref Range   Hemoglobin 8.1 (L)  13.0 - 17.0 g/dL   HCT 74.6 (L) 60.9 - 47.9 %    Comment: Performed at [facility], [address] [place], [place] [address]  CBC with Differential (Cancer Center Only)     Status: Abnormal   Collection Time: 07/21/23 12:41 PM  Result Value Ref Range   WBC Count 5.7 4.0 - 10.5 K/uL   RBC 2.55 (L) 4.22 - 5.81 MIL/uL   Hemoglobin 8.4 (L) 13.0 - 17.0 g/dL   HCT 73.6 (L) 60.9 - 47.9 %   MCV 103.1 (H) 80.0 - 100.0 fL   MCH 32.9 26.0 - 34.0 pg   MCHC 31.9 30.0 - 36.0 g/dL   RDW 74.6 (H) 88.4 - 84.4 %   Platelet Count 289 150 - 400 K/uL   nRBC 0.7 (H) 0.0 - 0.2 %   Neutrophils Relative % 70 %   Neutro Abs 4.0 1.7 - 7.7 K/uL   Lymphocytes Relative 20 %   Lymphs Abs 1.1 0.7 - 4.0 K/uL   Monocytes Relative 8 %   Monocytes Absolute 0.5 0.1 - 1.0 K/uL   Eosinophils Relative 1 %   Eosinophils Absolute 0.1 0.0 - 0.5 K/uL   Basophils Relative 1 %   Basophils Absolute 0.0 0.0 - 0.1 K/uL   Immature Granulocytes 0 %   Abs Immature Granulocytes 0.02 0.00 - 0.07 K/uL    Comment: Performed at [facility], [address] [place], [place] [address]  CMP (Cancer Center only)     Status: Abnormal   Collection Time: 07/21/23 12:41 PM  Result Value Ref Range   Sodium 136 135 - 145 mmol/L   Potassium 4.3 3.5 - 5.1 mmol/L   Chloride 108 98 - 111 mmol/L   CO2 23 22 - 32 mmol/L   Glucose, Bld 141 (H) 70 - 99 mg/dL    Comment: Glucose reference range applies only to samples taken after fasting for at least 8 hours.   BUN 30 (H) 8 - 23 mg/dL   Creatinine 8.64 (H) 9.38 - 1.24 mg/dL   Calcium  8.7 (L) 8.9 - 10.3 mg/dL   Total Protein 6.2 (  L) 6.5 - 8.1 g/dL   Albumin 4.0 3.5 - 5.0 g/dL   AST 23 15 - 41 U/L   ALT 17 0 - 44 U/L   Alkaline Phosphatase 42 38 - 126 U/L   Total Bilirubin 1.7 (H) 0.0 - 1.2 mg/dL   GFR, Estimated 54 (L) >60 mL/min    Comment: (NOTE) Calculated using the CKD-EPI Creatinine Equation (2021)    Anion gap 5 5 - 15    Comment: Performed at [facility],  [address] [place], [place] [address]  Ferritin     Status: Abnormal   Collection Time: 07/21/23 12:41 PM  Result Value Ref Range   Ferritin 719 (H) 24 - 336 ng/mL    Comment: Performed at [facility], [address] [place], [place] [address]  Iron and TIBC     Status: Abnormal   Collection Time: 07/21/23 12:41 PM  Result Value Ref Range   Iron 199 (H) 45 - 182 ug/dL   TIBC 769 (L) 749 - 549 ug/dL   Saturation Ratios 87 (H) 17.9 - 39.5 %   UIBC 31 ug/dL    Comment: Performed at [facility], [address] [place], [place] [address]  CBC with Differential (Cancer Center Only)     Status: Abnormal   Collection Time: 08/11/23 11:28 AM  Result Value Ref Range   WBC Count 5.7 4.0 - 10.5 K/uL   RBC 2.82 (L) 4.22 - 5.81 MIL/uL   Hemoglobin 9.2 (L) 13.0 - 17.0 g/dL   HCT 71.1 (L) 60.9 - 47.9 %   MCV 102.1 (H) 80.0 - 100.0 fL   MCH 32.6 26.0 - 34.0 pg   MCHC 31.9 30.0 - 36.0 g/dL   RDW 74.7 (H) 88.4 - 84.4 %   Platelet Count 342 150 - 400 K/uL   nRBC 1.0 (H) 0.0 - 0.2 %   Neutrophils Relative % 68 %   Neutro Abs 3.9 1.7 - 7.7 K/uL   Lymphocytes Relative 19 %   Lymphs Abs 1.1 0.7 - 4.0 K/uL   Monocytes Relative 9 %   Monocytes Absolute 0.5 0.1 - 1.0 K/uL   Eosinophils Relative 2 %   Eosinophils Absolute 0.1 0.0 - 0.5 K/uL   Basophils Relative 1 %   Basophils Absolute 0.1 0.0 - 0.1 K/uL   Immature Granulocytes 1 %   Abs Immature Granulocytes 0.04 0.00 - 0.07 K/uL    Comment: Performed at [facility], [address] [place], [place] [address]  CMP (Cancer Center only)     Status: Abnormal   Collection Time: 08/11/23 11:28 AM  Result Value Ref Range   Sodium 134 (L) 135 - 145 mmol/L   Potassium 4.4 3.5 - 5.1 mmol/L   Chloride 106 98 - 111 mmol/L   CO2 22 22 - 32 mmol/L   Glucose, Bld 144 (H) 70 - 99 mg/dL    Comment: Glucose reference range applies only to samples taken after fasting for at least 8 hours.   BUN 20 8 - 23 mg/dL   Creatinine  8.62 (H) 0.61 - 1.24 mg/dL   Calcium  8.5 (L) 8.9 - 10.3 mg/dL   Total Protein 6.2 (L) 6.5 - 8.1 g/dL   Albumin 4.1 3.5 - 5.0 g/dL   AST 28 15 - 41 U/L   ALT 21 0 - 44 U/L   Alkaline Phosphatase 47 38 - 126 U/L   Total Bilirubin 2.0 (H) 0.0 - 1.2 mg/dL   GFR, Estimated  53 (L) >60 mL/min    Comment: (NOTE) Calculated using the CKD-EPI Creatinine Equation (2021)    Anion gap 6 5 - 15    Comment: Performed at [facility], [address] [place], [place] [address]  Ferritin     Status: Abnormal   Collection Time: 08/11/23 11:28 AM  Result Value Ref Range   Ferritin 773 (H) 24 - 336 ng/mL    Comment: Performed at [facility], [address] [place], [place] [address]  Ferritin     Status: Abnormal   Collection Time: 09/01/23  9:09 AM  Result Value Ref Range   Ferritin 907 (H) 24 - 336 ng/mL    Comment: Performed at [facility], [address] [place], [place] [address]  CMP (Cancer Center only)     Status: Abnormal   Collection Time: 09/01/23  9:09 AM  Result Value Ref Range   Sodium 135 135 - 145 mmol/L   Potassium 4.3 3.5 - 5.1 mmol/L   Chloride 104 98 - 111 mmol/L   CO2 24 22 - 32 mmol/L   Glucose, Bld 144 (H) 70 - 99 mg/dL    Comment: Glucose reference range applies only to samples taken after fasting for at least 8 hours.   BUN 20 8 - 23 mg/dL   Creatinine 8.63 (H) 9.38 - 1.24 mg/dL   Calcium  8.6 (L) 8.9 - 10.3 mg/dL   Total Protein 6.1 (L) 6.5 - 8.1 g/dL   Albumin 3.9 3.5 - 5.0 g/dL   AST 25 15 - 41 U/L   ALT 20 0 - 44 U/L   Alkaline Phosphatase 46 38 - 126 U/L   Total Bilirubin 1.9 (H) 0.0 - 1.2 mg/dL   GFR, Estimated 54 (L) >60 mL/min    Comment: (NOTE) Calculated using the CKD-EPI Creatinine Equation (2021)    Anion gap 7 5 - 15    Comment: Performed at [facility], [address] [place], [place] [address]  CBC with Differential (Cancer Center Only)     Status: Abnormal   Collection Time: 09/01/23  9:09 AM  Result Value  Ref Range   WBC Count 5.5 4.0 - 10.5 K/uL   RBC 2.92 (L) 4.22 - 5.81 MIL/uL   Hemoglobin 9.4 (L) 13.0 - 17.0 g/dL   HCT 69.9 (L) 60.9 - 47.9 %   MCV 102.7 (H) 80.0 - 100.0 fL   MCH 32.2 26.0 - 34.0 pg   MCHC 31.3 30.0 - 36.0 g/dL   RDW 74.4 (H) 88.4 - 84.4 %   Platelet Count 299 150 - 400 K/uL   nRBC 2.4 (H) 0.0 - 0.2 %   Neutrophils Relative % 66 %   Neutro Abs 3.7 1.7 - 7.7 K/uL   Lymphocytes Relative 20 %   Lymphs Abs 1.1 0.7 - 4.0 K/uL   Monocytes Relative 8 %   Monocytes Absolute 0.4 0.1 - 1.0 K/uL   Eosinophils Relative 4 %   Eosinophils Absolute 0.2 0.0 - 0.5 K/uL   Basophils Relative 1 %   Basophils Absolute 0.0 0.0 - 0.1 K/uL   Immature Granulocytes 1 %   Abs Immature Granulocytes 0.05 0.00 - 0.07 K/uL    Comment: Performed at [facility], [address], [place], [place] [address]    Radiology IR Radiologist Eval & Mgmt Result Date: 08/16/2023 CLINICAL DATA:  IR clinic follow-up. EXAM: IR EVAL AND MANAGEMENT COMPARISON:  None Available. FINDINGS: See dictated note in Epic. IMPRESSION: See dictated  note in Epic. Electronically Signed   By: Marcey Moan M.D.   On: 08/16/2023 12:53    Assessment/Plan  Lymphedema Continue compression, elevation, and the lymphedema pump at least daily.  Conservative measures are controlling his symptoms quite well.  Varicose veins of both lower extremities with inflammation Although he has significant venous reflux, at this point conservative measures are controlling his symptoms quite well.  As such, I would not recommend any intervention and will continue to avoid laser ablation at this point.  Follow-up in 1 year.    Selinda Gu, MD  09/14/2023 11:54 AM    This note was created with Dragon medical transcription system.  Any errors from dictation are purely unintentional

## 2023-09-15 ENCOUNTER — Inpatient Hospital Stay

## 2023-09-15 ENCOUNTER — Other Ambulatory Visit: Payer: Self-pay

## 2023-09-15 DIAGNOSIS — Z01818 Encounter for other preprocedural examination: Secondary | ICD-10-CM

## 2023-09-15 NOTE — Progress Notes (Signed)
 Patient for LT renal cryoablation with biopsy on Thurs 09/16/23, I called and spoke with the patient on the phone and gave pre-procedure instructions. Pt was made aware to be here at 7a, NPO after MN prior to procedure as well as driver post procedure/recovery/discharge. Pt stated understanding.  Called 09/15/23

## 2023-09-15 NOTE — H&P (Signed)
 Chief Complaint: Patient was seen in consultation today for left renal mass, with consideration for cryoablation.  Referring Provider(s): established patient.  Supervising Physician: Luverne Aran  Patient Status: [facility] - Out-pt  Patient is Full Code  History of Present Illness: Brett Boone is a 78 y.o. male  with PMHx notable for left renal mass, myelodysplastic syndrome, heart murmur, anemia, arthritis, and other as delineated below.  Per Dr. Herminia progress note on 7/21: Brett Boone is a 78 y.o. male  seen previously for opinion regarding possible ablation of a left anterior renal mass.  This mass showed little change between MRI studies on 10/16/2019, 04/12/2020, 11-13-20 and 06/26/2021. Decision was made to continue active MRI surveillance. The lesion did show very slight growth by MRI on 06/23/22 when compared to prior scans, especially going back to 2021 and it was elected to perform at least an additional 1 year follow up scan. A follow up was performed on 06/16/23. He remains asymptomatic.  MR Abd w/ w/o cm on 06/16/23: MPRESSION: 1. Slight interval enlargement of a heterogeneously hypoenhancing mass of the anterior midportion of the left kidney, measuring 1.8 x 1.4 cm, previously 1.5 x 1.2 cm. This is consistent with a slowly enlarging renal cell carcinoma. 2. No evidence of lymphadenopathy or metastatic disease in the abdomen. 3. Splenomegaly. 4. Hepatic steatosis and hepatic parenchymal iron deposition. 5. Unchanged fluid signal cystic lesion in the central pancreatic head measuring 0.6 cm. No solid component or suspicious contrast enhancement. 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 Given tiny size, imaging stability, and patient age, no specific further follow-up or characterization is required.   Patient was therefore recommended for left renal mass cryoablation, and is in agreement with treatment plan. He is scheduled for same in IR  today.   ***Patient is alert and laying in bed, calm.  Patient is currently without any significant complaints.  Patient denies any fevers, headache, chest pain, SOB, cough, abdominal pain, nausea, vomiting or bleeding.     Past Medical History:  Diagnosis Date   Anemia    Arthritis    Heart murmur    Hydrocele    MDS (myelodysplastic syndrome) (HCC)    Rotator cuff tear arthropathy of right shoulder 08/01/2020   Venous stasis dermatitis of both lower extremities     Past Surgical History:  Procedure Laterality Date   CATARACT EXTRACTION W/PHACO Right 03/09/2017   Procedure: CATARACT EXTRACTION PHACO AND INTRAOCULAR LENS PLACEMENT (IOC) RIGHT;  Surgeon: Myrna Adine Anes, MD;  Location: [facility];  Service: Ophthalmology;  Laterality: Right;   CATARACT EXTRACTION W/PHACO Left 08/25/2021   Procedure: CATARACT EXTRACTION PHACO AND INTRAOCULAR LENS PLACEMENT (IOC) LEFT;  Surgeon: Myrna Adine Anes, MD;  Location: [facility];  Service: Ophthalmology;  Laterality: Left;   CATARACT EXTRACTION W/PHACO Left 10/13/2021   Procedure: CATARACT EXTRACTION PHACO AND INTRAOCULAR LENS PLACEMENT (IOC) LEFT;  Surgeon: Myrna Adine Anes, MD;  Location: [facility];  Service: Ophthalmology;  Laterality: Left;  8.12 0:52.0   COLONOSCOPY  2013   normal- cleared for 5 yrs- Dr Parley   COLONOSCOPY WITH PROPOFOL  N/A 02/17/2017   Procedure: COLONOSCOPY WITH PROPOFOL ;  Surgeon: [name], MD;  Location: [facility];  Service: Endoscopy;  Laterality: N/A;   COLONOSCOPY WITH PROPOFOL  N/A 10/12/2019   Procedure: COLONOSCOPY WITH BIOPSY;  Surgeon: [name], MD;  Location: [facility];  Service: Endoscopy;  Laterality: N/A;   EPIGASTRIC HERNIA  REPAIR N/A 07/06/2014   Procedure: HERNIA REPAIR EPIGASTRIC ADULT;  Surgeon: [name], MD;  Location: [facility] ORS;  Service: General;  Laterality: N/A;   HERNIA REPAIR Bilateral    INSERTION OF  MESH N/A 07/06/2014   Procedure: INSERTION OF MESH;  Surgeon: [name], MD;  Location: [facility] ORS;  Service: General;  Laterality: N/A;   IR RADIOLOGIST EVAL & MGMT  11/07/2019   IR RADIOLOGIST EVAL & MGMT  04/25/2020   IR RADIOLOGIST EVAL & MGMT  12/05/2020   IR RADIOLOGIST EVAL & MGMT  06/30/2021   IR RADIOLOGIST EVAL & MGMT  08/16/2023   POLYPECTOMY  02/17/2017   Procedure: POLYPECTOMY;  Surgeon: [name], MD;  Location: [facility];  Service: Endoscopy;;   POLYPECTOMY N/A 10/12/2019   Procedure: POLYPECTOMY;  Surgeon: [name], MD;  Location: [facility];  Service: Endoscopy;  Laterality: N/A;   SHOULDER ARTHROSCOPY Bilateral 2008   SINUS EXPLORATION      Allergies: Amoxicillin-pot clavulanate  Medications: Prior to Admission medications   Medication Sig Start Date End Date Taking? Authorizing Provider  acetaminophen  (TYLENOL ) 325 MG tablet Take 650 mg by mouth every 6 (six) hours as needed.    [provider]  Calcium  Carb-Cholecalciferol 600-400 MG-UNIT TABS Take by mouth.    [provider]  ibuprofen (ADVIL) 200 MG tablet Take 200 mg by mouth every 6 (six) hours as needed.    [provider]  LUSPATERCEPT -AAMT [place] Inject into the skin every 21 ( twenty-one) days.    [provider]  tadalafil  (CIALIS ) 5 MG tablet Take 1 tablet (5 mg total) by mouth daily. Ok to take additional 5-15mg  as needed 1 hour prior to sexual activity 09/02/23   Francisca Redell BROCKS, MD     Family History  Problem Relation Age of Onset   Diabetes Father    Heart disease Father 58   Stroke Mother     Social History   Socioeconomic History   Marital status: Married    Spouse name: Elvin, Mccartin (Spouse)   Number of children: 3   Years of education: Not on file   Highest education level: Bachelor's degree (e.g., BA, AB, BS)  Occupational History   Occupation: Retired  Tobacco Use   Smoking status: Former    Current  packs/day: 0.00    Average packs/day: 0.3 packs/day for 50.0 years (12.5 ttl pk-yrs)    Types: Cigarettes    Start date: 05/02/1968    Quit date: 05/03/2018    Years since quitting: 5.3   Smokeless tobacco: Former   Tobacco comments:    pt quit smoking  Vaping Use   Vaping status: Never Used  Substance and Sexual Activity   Alcohol use: Yes    Comment: Occasional -1x/month   Drug use: No   Sexual activity: Yes  Other Topics Concern   Not on file  Social History Narrative   Not on file   Social Drivers of Health   Financial Resource Strain: Low Risk  (04/28/2023)   Overall Financial Resource Strain (CARDIA)    Difficulty of Paying Living Expenses: Not hard at all  Food Insecurity: No Food Insecurity (04/28/2023)   Hunger Vital Sign    Worried About Running Out of Food in the Last Year: Never true    Ran Out of Food in the Last Year: Never true  Transportation Needs: No Transportation Needs (04/28/2023)   PRAPARE - Administrator, Civil Service (Medical): No  Lack of Transportation (Non-Medical): No  Physical Activity: Sufficiently Active (04/28/2023)   Exercise Vital Sign    Days of Exercise per Week: 3 days    Minutes of Exercise per Session: 60 min  Stress: No Stress Concern Present (04/28/2023)   [employer] of Occupational Health - Occupational Stress Questionnaire    Feeling of Stress : Not at all  Social Connections: Moderately Integrated (04/28/2023)   Social Connection and Isolation Panel    Frequency of Communication with Friends and Family: More than three times a week    Frequency of Social Gatherings with Friends and Family: Once a week    Attends Religious Services: More than 4 times per year    Active Member of [employer] or Organizations: No    Attends Banker Meetings: Never    Marital Status: Married     Review of Systems: A 12 point ROS discussed and pertinent positives are indicated in the HPI above.  All other systems are  negative.  Vital Signs: There were no vitals taken for this visit.  Advance Care Plan: The advanced care place/surrogate decision maker was discussed at the time of visit and the patient did not wish to discuss or was not able to name a surrogate decision maker or provide an advance care plan.  Physical Exam  Imaging: No results found.  Labs:  CBC: Recent Labs    07/21/23 1241 08/11/23 1128 09/01/23 0909 09/14/23 1154  WBC 5.7 5.7 5.5 6.4  HGB 8.4* 9.2* 9.4* 9.2*  HCT 26.3* 28.8* 30.0* 27.6*  PLT 289 342 299 273    COAGS: Recent Labs    09/14/23 1154  INR 1.2    BMP: Recent Labs    07/21/23 1241 08/11/23 1128 09/01/23 0909 09/14/23 1154  NA 136 134* 135 138  K 4.3 4.4 4.3 4.8  CL 108 106 104 106  CO2 23 22 24 23   GLUCOSE 141* 144* 144* 104*  BUN 30* 20 20 24*  CALCIUM  8.7* 8.5* 8.6* 9.0  CREATININE 1.35* 1.37* 1.36* 1.18  GFRNONAA 54* 53* 54* >60    LIVER FUNCTION TESTS: Recent Labs    04/28/23 1111 07/21/23 1241 08/11/23 1128 09/01/23 0909  BILITOT 2.6* 1.7* 2.0* 1.9*  AST 21 23 28 25   ALT 17 17 21 20   ALKPHOS 45 42 47 46  PROT 6.5 6.2* 6.2* 6.1*  ALBUMIN 4.0 4.0 4.1 3.9    TUMOR MARKERS: No results for input(s): AFPTM, CEA, CA199, CHROMGRNA in the last 8760 hours.  Assessment and Plan: Per Dr. Herminia progress note on 7/21: The most recent MRI in May was read as increase in the anterior left renal mass up to 1.8 x 1.4 cm. By my measurements, the lesion may actually measure up to 1.8 x 2.0 cm on the post-contrast images and by comparison measured up to 1.5 cm in 2022. Due to clear slow growth over time, I recommended that we consider percutaneous cryoablation of the lesion with attempted biopsy at that time. I discussed details of cryoablation with Mr. Ellender and his wife and answered all of their questions. After discussion, he would like to proceed with cryoablation soon. He would prefer to schedule the procedure at [facility].    Patient presents for scheduled left renal mass cryoablation in IR today.  ***Patient has been NPO since midnight.  All labs and medications are within acceptable parameters.  No pertinent allergies.   Risks and benefits of image guided renal cryoablation was discussed with the patient  including, but not limited to, failure to treat entire lesion, bleeding, infection, damage to adjacent structures, hematuria, urine leak, decrease in renal function or post procedural neuropathy.  All of the patient's questions were answered and the patient is agreeable to proceed. Consent signed and in chart.     Thank you for allowing our service to participate in Brett Boone 's care.  Electronically Signed: Carlin DELENA Griffon, PA-C   09/15/2023, 4:21 PM      I spent a total of  25 Minutes in face to face in clinical consultation, greater than 50% of which was counseling/coordinating care for left renal mass, with consideration for cryoablation.

## 2023-09-16 ENCOUNTER — Encounter: Payer: Self-pay | Admitting: Urgent Care

## 2023-09-16 ENCOUNTER — Other Ambulatory Visit: Payer: Self-pay | Admitting: Interventional Radiology

## 2023-09-16 ENCOUNTER — Other Ambulatory Visit: Payer: Self-pay

## 2023-09-16 ENCOUNTER — Ambulatory Visit: Payer: Self-pay | Admitting: Certified Registered"

## 2023-09-16 ENCOUNTER — Ambulatory Visit
Admission: RE | Admit: 2023-09-16 | Discharge: 2023-09-16 | Disposition: A | Discharge status: Home / Self Care | Source: Ambulatory Visit | Attending: Interventional Radiology | Admitting: Interventional Radiology

## 2023-09-16 DIAGNOSIS — Z01818 Encounter for other preprocedural examination: Secondary | ICD-10-CM

## 2023-09-16 DIAGNOSIS — K760 Fatty (change of) liver, not elsewhere classified: Secondary | ICD-10-CM | POA: Diagnosis not present

## 2023-09-16 DIAGNOSIS — N289 Disorder of kidney and ureter, unspecified: Secondary | ICD-10-CM

## 2023-09-16 DIAGNOSIS — Z87891 Personal history of nicotine dependence: Secondary | ICD-10-CM | POA: Insufficient documentation

## 2023-09-16 DIAGNOSIS — N2889 Other specified disorders of kidney and ureter: Secondary | ICD-10-CM | POA: Diagnosis present

## 2023-09-16 DIAGNOSIS — C642 Malignant neoplasm of left kidney, except renal pelvis: Secondary | ICD-10-CM | POA: Diagnosis not present

## 2023-09-16 DIAGNOSIS — R161 Splenomegaly, not elsewhere classified: Secondary | ICD-10-CM | POA: Insufficient documentation

## 2023-09-16 MED ORDER — DEXAMETHASONE SODIUM PHOSPHATE 10 MG/ML IJ SOLN
INTRAMUSCULAR | Status: AC
  Filled 2023-09-16: qty 1

## 2023-09-16 MED ORDER — VANCOMYCIN HCL 1000 MG IV SOLR
1000.0000 mg | INTRAVENOUS | Status: AC
  Administered 2023-09-16: 1000 mg via INTRAVENOUS
  Filled 2023-09-16: qty 1000

## 2023-09-16 MED ORDER — SENNOSIDES-DOCUSATE SODIUM 8.6-50 MG PO TABS
1.0000 | ORAL_TABLET | Freq: Every day | ORAL | Status: DC | PRN
Start: 2023-09-16 — End: 2023-09-17

## 2023-09-16 MED ORDER — DOCUSATE SODIUM 100 MG PO CAPS
100.0000 mg | ORAL_CAPSULE | Freq: Two times a day (BID) | ORAL | Status: DC
  Filled 2023-09-16: qty 1

## 2023-09-16 MED ORDER — VANCOMYCIN HCL IN DEXTROSE 1-5 GM/200ML-% IV SOLN: 1000.0000 mg | INTRAVENOUS | Status: DC

## 2023-09-16 MED ORDER — LIDOCAINE HCL (PF) 2 % IJ SOLN
INTRAMUSCULAR | Status: AC
  Filled 2023-09-16: qty 5

## 2023-09-16 MED ORDER — FENTANYL CITRATE (PF) 100 MCG/2ML IJ SOLN
INTRAMUSCULAR | Status: AC
  Filled 2023-09-16: qty 2

## 2023-09-16 MED ORDER — OXYCODONE HCL 5 MG PO TABS
ORAL_TABLET | ORAL | Status: AC
Start: 2023-09-16 — End: 2023-09-16
  Filled 2023-09-16: qty 1

## 2023-09-16 MED ORDER — HYDROCODONE-ACETAMINOPHEN 5-325 MG PO TABS: 1.0000 | ORAL_TABLET | ORAL | Status: DC | PRN

## 2023-09-16 MED ORDER — ROCURONIUM BROMIDE 100 MG/10ML IV SOLN
INTRAVENOUS | Status: DC | PRN
Start: 2023-09-16 — End: 2023-09-16
  Administered 2023-09-16: 20 mg via INTRAVENOUS
  Administered 2023-09-16: 70 mg via INTRAVENOUS

## 2023-09-16 MED ORDER — FENTANYL CITRATE (PF) 100 MCG/2ML IJ SOLN
INTRAMUSCULAR | Status: DC | PRN
  Administered 2023-09-16 (×2): 50 ug via INTRAVENOUS

## 2023-09-16 MED ORDER — LIDOCAINE HCL (CARDIAC) PF 100 MG/5ML IV SOSY
PREFILLED_SYRINGE | INTRAVENOUS | Status: DC | PRN
  Administered 2023-09-16: 100 mg via INTRAVENOUS

## 2023-09-16 MED ORDER — PROPOFOL 10 MG/ML IV BOLUS
INTRAVENOUS | Status: AC
  Filled 2023-09-16: qty 20

## 2023-09-16 MED ORDER — ROCURONIUM BROMIDE 10 MG/ML (PF) SYRINGE
PREFILLED_SYRINGE | INTRAVENOUS | Status: AC
  Filled 2023-09-16: qty 10

## 2023-09-16 MED ORDER — PROPOFOL 10 MG/ML IV BOLUS
INTRAVENOUS | Status: DC | PRN
  Administered 2023-09-16: 100 mg via INTRAVENOUS

## 2023-09-16 MED ORDER — ONDANSETRON HCL 4 MG/2ML IJ SOLN
INTRAMUSCULAR | Status: AC
  Filled 2023-09-16: qty 2

## 2023-09-16 MED ORDER — SODIUM CHLORIDE 0.9 % IV SOLN: INTRAVENOUS | Status: DC

## 2023-09-16 MED ORDER — DEXAMETHASONE SODIUM PHOSPHATE 10 MG/ML IJ SOLN
INTRAMUSCULAR | Status: DC | PRN
Start: 2023-09-16 — End: 2023-09-16
  Administered 2023-09-16: 10 mg via INTRAVENOUS

## 2023-09-16 MED ORDER — PHENYLEPHRINE HCL-NACL 20-0.9 MG/250ML-% IV SOLN
INTRAVENOUS | Status: DC | PRN
  Administered 2023-09-16: 20 ug/min via INTRAVENOUS

## 2023-09-16 MED ORDER — OXYCODONE HCL 5 MG PO TABS
5.0000 mg | ORAL_TABLET | Freq: Once | ORAL | Status: AC
  Administered 2023-09-16: 5 mg via ORAL

## 2023-09-16 MED ORDER — SUGAMMADEX SODIUM 200 MG/2ML IV SOLN
INTRAVENOUS | Status: DC | PRN
Start: 2023-09-16 — End: 2023-09-16
  Administered 2023-09-16: 200 mg via INTRAVENOUS

## 2023-09-16 MED ORDER — ONDANSETRON HCL 4 MG/2ML IJ SOLN: 4.0000 mg | Freq: Four times a day (QID) | INTRAMUSCULAR | Status: DC | PRN

## 2023-09-16 MED ORDER — VANCOMYCIN HCL IN DEXTROSE 1-5 GM/200ML-% IV SOLN
1000.0000 mg | INTRAVENOUS | Status: DC
  Filled 2023-09-16: qty 200

## 2023-09-16 MED ORDER — ONDANSETRON HCL 4 MG/2ML IJ SOLN
INTRAMUSCULAR | Status: DC | PRN
  Administered 2023-09-16: 4 mg via INTRAVENOUS

## 2023-09-16 MED ORDER — PHENYLEPHRINE HCL-NACL 20-0.9 MG/250ML-% IV SOLN
INTRAVENOUS | Status: AC
  Filled 2023-09-16: qty 250

## 2023-09-16 NOTE — Transfer of Care (Signed)
 Immediate Anesthesia Transfer of Care Note  Patient: Brett Boone III  Procedure(s) Performed: CT GUIDE TISSUE ABLATION CT RENAL BIOPSY  Patient Location: PACU  Anesthesia Type:General  Level of Consciousness: awake, alert , and drowsy  Airway & Oxygen Therapy: Patient Spontanous Breathing and Patient connected to face mask oxygen  Post-op Assessment: Report given to RN and Post -op Vital signs reviewed and stable  Post vital signs: Reviewed and stable  Last Vitals:  Vitals Value Taken Time  BP 129/81 09/16/23 11:08  Temp 35.8 1109  Pulse 65 09/16/23 11:12  Resp 12 09/16/23 11:12  SpO2 100 % 09/16/23 11:12  Vitals shown include unfiled device data. Pt. Transported to PACU with transport monitors and oxygen via facemask.   Last Pain:  Vitals:   09/16/23 0730  TempSrc: Temporal  PainSc: 0-No pain         Complications: No notable events documented.

## 2023-09-16 NOTE — Anesthesia Procedure Notes (Signed)
 Procedure Name: Intubation Date/Time: 09/16/2023 8:56 AM  Performed by: Jackye Spanner, CRNAPre-anesthesia Checklist: Patient identified, Patient being monitored, Timeout performed, Emergency Drugs available and Suction available Patient Re-evaluated:Patient Re-evaluated prior to induction Oxygen Delivery Method: Circle system utilized Preoxygenation: Pre-oxygenation with 100% oxygen Induction Type: IV induction Ventilation: Mask ventilation without difficulty and Oral airway inserted - appropriate to patient size Laryngoscope Size: 3 and McGrath Grade View: Grade I Tube type: Oral Tube size: 7.0 mm Number of attempts: 1 Airway Equipment and Method: Stylet Placement Confirmation: ETT inserted through vocal cords under direct vision, positive ETCO2 and breath sounds checked- equal and bilateral Secured at: 22 cm Tube secured with: Tape Dental Injury: Teeth and Oropharynx as per pre-operative assessment  Comments: Smooth atraumatic intubation, no complications noted.

## 2023-09-16 NOTE — Anesthesia Preprocedure Evaluation (Addendum)
 Anesthesia Evaluation  Patient identified by MRN, date of birth, ID band Patient awake    Reviewed: Allergy & Precautions, NPO status , Patient's Chart, lab work & pertinent test results  Airway Mallampati: II  TM Distance: >3 FB Neck ROM: full    Dental no notable dental hx.    Pulmonary former smoker   Pulmonary exam normal        Cardiovascular Normal cardiovascular exam+ dysrhythmias (PACs, suspected afib but no events on home monitor)      Neuro/Psych negative neurological ROS  negative psych ROS   GI/Hepatic negative GI ROS, Neg liver ROS,,,  Endo/Other  negative endocrine ROS    Renal/GU Renal diseaseLeft anterior renal mass  negative genitourinary   Musculoskeletal   Abdominal  (+) + obese  Peds  Hematology  (+) Blood dyscrasia, anemia MDS (myelodysplastic syndrome) on EPO   Anesthesia Other Findings Past Medical History: No date: Anemia No date: Heart murmur No date: Hydrocele No date: MDS (myelodysplastic syndrome) (HCC) 08/01/2020: Rotator cuff tear arthropathy of right shoulder No date: Venous stasis dermatitis of both lower extremities  Past Surgical History: 03/09/2017: CATARACT EXTRACTION W/PHACO; Right     Comment:  Procedure: CATARACT EXTRACTION PHACO AND INTRAOCULAR               LENS PLACEMENT (IOC) RIGHT;  Surgeon: Myrna Adine Anes,              MD;  Location: [facility];  Service:               Ophthalmology;  Laterality: Right; 2013: COLONOSCOPY     Comment:  normal- cleared for 5 yrs- Dr Parley 02/17/2017: COLONOSCOPY WITH PROPOFOL ; N/A     Comment:  Procedure: COLONOSCOPY WITH PROPOFOL ;  Surgeon: [name], MD;  Location: [facility];                Service: Endoscopy;  Laterality: N/A; 10/12/2019: COLONOSCOPY WITH PROPOFOL ; N/A     Comment:  Procedure: COLONOSCOPY WITH BIOPSY;  Surgeon: [name], MD;  Location:  [facility];                Service: Endoscopy;  Laterality: N/A; 07/06/2014: EPIGASTRIC HERNIA REPAIR; N/A     Comment:  Procedure: HERNIA REPAIR EPIGASTRIC ADULT;  Surgeon:               [name], MD;  Location: [facility] ORS;  Service:               General;  Laterality: N/A; No date: HERNIA REPAIR; Bilateral 07/06/2014: INSERTION OF MESH; N/A     Comment:  Procedure: INSERTION OF MESH;  Surgeon: [name], MD;  Location: [facility] OR[facility];  Service: General;                Laterality: N/A; 11/07/2019: IR RADIOLOGIST EVAL & MGMT 04/25/2020: IR RADIOLOGIST EVAL & MGMT 12/05/2020: IR RADIOLOGIST EVAL & MGMT 06/30/2021: IR RADIOLOGIST EVAL & MGMT 02/17/2017: POLYPECTOMY     Comment:  Procedure: POLYPECTOMY;  Surgeon: [name],               MD;  Location: [facility];  Service: Endoscopy;; 10/12/2019: POLYPECTOMY; N/A     Comment:  Procedure: POLYPECTOMY;  Surgeon: [name],               MD;  Location: [facility];  Service: Endoscopy;               Laterality: N/A; 2008: SHOULDER ARTHROSCOPY; Left No date: SINUS EXPLORATION  BMI    Body Mass Index: 34.87 kg/m      Reproductive/Obstetrics negative OB ROS                              Anesthesia Physical Anesthesia Plan  ASA: 2  Anesthesia Plan: General   Post-op Pain Management: Minimal or no pain anticipated   Induction: Intravenous  PONV Risk Score and Plan: 2 and Ondansetron  and Dexamethasone   Airway Management Planned: Oral ETT  Additional Equipment:   Intra-op Plan:   Post-operative Plan: Extubation in OR  Informed Consent: I have reviewed the patients History and Physical, chart, labs and discussed the procedure including the risks, benefits and alternatives for the proposed anesthesia with the patient or authorized representative who has indicated his/her understanding and acceptance.     Dental Advisory Given  Plan  Discussed with: CRNA and Surgeon  Anesthesia Plan Comments:          Anesthesia Quick Evaluation

## 2023-09-16 NOTE — Progress Notes (Signed)
 Patient awake/alert x4.  Indwelling foley catheter intact, clear yellow urine no blood noted. Per orders to stay for six hours, patient made aware. Patient tolerated sprite without event. Left flank dressing r/t procedure today c/d/I gauze wiith medipore tape.

## 2023-09-16 NOTE — Discharge Instructions (Signed)
 Needle Biopsy, Care After These instructions tell you how to care for yourself after your procedure. Your doctor may also give you more specific instructions. Call your doctor if you have any problems or questions. What can I expect after the procedure? After the procedure, it is common to have: Soreness. Bruising. Mild pain. Follow these instructions at home:  Return to your normal activities tomorrow. Take over-the-counter and prescription medicines only as told by your doctor. Wash your hands with soap and water before you change your bandage (dressing). If you cannot use soap and water, use hand sanitizer. You may remove your bandage in 2 days Check your puncture site every day for signs of infection. Watch for: Redness, swelling, or pain. Fluid or blood.  Pus or a bad smell. Warmth. You can shower tomorrow Keep all follow-up visits as told by your doctor. This is important. Contact a doctor if you have: A fever. Redness, swelling, or pain at the puncture site, and it lasts longer than a few days. Fluid, blood, or pus coming from the puncture site. Warmth coming from the puncture site. Get help right away if: You have a lot of bleeding from the puncture site. Summary After the procedure, it is common to have soreness, bruising, or mild pain at the puncture site. Check your puncture site every day for signs of infection, such as redness, swelling, or pain. Get help right away if you have severe bleeding from your puncture site. This information is not intended to replace advice given to you by your health care provider. Make sure you discuss any questions you have with your health care provider. Document Revised: 01/25/2017 Document Reviewed: 01/25/2017 Elsevier Patient Education  2020 [employer].

## 2023-09-16 NOTE — Procedures (Signed)
 Interventional Radiology Procedure Note  Procedure: CT guided left renal mass biopsy and cryoablation  Anesthesia: General  Complications: None  Estimated Blood Loss: < 10 mL  Findings: Hydrodissection with 120 mL dilute contrast/NS mixture to displace descending colon.  Biopsy: 18 G core biopsy x 1 via 17 G trocar needle.  Cryoablation: Single IceForce probe. 2 freeze cycles, good ice ball formation by CT.  Plan: PACU recovery and planned discharge after 4 hours bedrest if doing well.  Marcey DASEN. Luverne, M.D Pager:  [phone]

## 2023-09-16 NOTE — Progress Notes (Signed)
 Patient clinically stable post CT renal biopsy along with Tissue ablation, per Dr Luverne, tolerated well. Extubated post procedure per CRNA/Vic, after which accompanied CRNA with patient to PACU 5 with report given. Called patient's wife with update given.

## 2023-09-17 LAB — SURGICAL PATHOLOGY

## 2023-09-17 NOTE — Anesthesia Postprocedure Evaluation (Signed)
 Anesthesia Post Note  Patient: Brett Boone  Procedure(s) Performed: CT GUIDE TISSUE ABLATION CT RENAL BIOPSY  Patient location during evaluation: PACU Anesthesia Type: General Level of consciousness: awake and alert Pain management: pain level controlled Vital Signs Assessment: post-procedure vital signs reviewed and stable Respiratory status: spontaneous breathing, nonlabored ventilation and respiratory function stable Cardiovascular status: blood pressure returned to baseline and stable Postop Assessment: no apparent nausea or vomiting Anesthetic complications: no   No notable events documented.   Last Vitals:  Vitals:   09/16/23 1445 09/16/23 1500  BP: 117/70 129/69  Pulse: 65 65  Resp: 16 15  Temp:  (!) 36.1 C  SpO2: 94% 98%    Last Pain:  Vitals:   09/16/23 1500  TempSrc: Temporal  PainSc: 0-No pain                 Camellia Merilee Louder

## 2023-09-22 ENCOUNTER — Other Ambulatory Visit: Payer: Self-pay | Admitting: Oncology

## 2023-09-22 ENCOUNTER — Inpatient Hospital Stay

## 2023-09-22 VITALS — BP 151/73 | HR 94 | Temp 97.2°F | Resp 18 | Wt 253.1 lb

## 2023-09-22 DIAGNOSIS — D461 Refractory anemia with ring sideroblasts: Secondary | ICD-10-CM

## 2023-09-22 DIAGNOSIS — D469 Myelodysplastic syndrome, unspecified: Secondary | ICD-10-CM

## 2023-09-22 LAB — HEMOGLOBIN AND HEMATOCRIT (CANCER CENTER ONLY)
HCT: 28.7 % — ABNORMAL LOW (ref 39.0–52.0)
Hemoglobin: 9 g/dL — ABNORMAL LOW (ref 13.0–17.0)

## 2023-09-22 MED ORDER — LUSPATERCEPT-AAMT 75 MG ~~LOC~~ SOLR
1.0000 mg/kg | Freq: Once | SUBCUTANEOUS | Status: AC
  Administered 2023-09-22: 120 mg via SUBCUTANEOUS
  Filled 2023-09-22: qty 1

## 2023-09-29 ENCOUNTER — Inpatient Hospital Stay

## 2023-10-13 ENCOUNTER — Inpatient Hospital Stay

## 2023-10-13 ENCOUNTER — Inpatient Hospital Stay: Attending: Nurse Practitioner

## 2023-10-13 VITALS — BP 135/83 | HR 99 | Temp 97.6°F | Resp 18 | Wt 252.4 lb

## 2023-10-13 DIAGNOSIS — D461 Refractory anemia with ring sideroblasts: Secondary | ICD-10-CM | POA: Insufficient documentation

## 2023-10-13 LAB — CBC WITH DIFFERENTIAL/PLATELET
Abs Immature Granulocytes: 0.04 K/uL (ref 0.00–0.07)
Basophils Absolute: 0 K/uL (ref 0.0–0.1)
Basophils Relative: 1 %
Eosinophils Absolute: 0.3 K/uL (ref 0.0–0.5)
Eosinophils Relative: 4 %
HCT: 29.1 % — ABNORMAL LOW (ref 39.0–52.0)
Hemoglobin: 9.3 g/dL — ABNORMAL LOW (ref 13.0–17.0)
Immature Granulocytes: 1 %
Lymphocytes Relative: 18 %
Lymphs Abs: 1.2 K/uL (ref 0.7–4.0)
MCH: 32.2 pg (ref 26.0–34.0)
MCHC: 32 g/dL (ref 30.0–36.0)
MCV: 100.7 fL — ABNORMAL HIGH (ref 80.0–100.0)
Monocytes Absolute: 0.5 K/uL (ref 0.1–1.0)
Monocytes Relative: 7 %
Neutro Abs: 4.4 K/uL (ref 1.7–7.7)
Neutrophils Relative %: 69 %
Platelets: 262 K/uL (ref 150–400)
RBC: 2.89 MIL/uL — ABNORMAL LOW (ref 4.22–5.81)
RDW: 26.4 % — ABNORMAL HIGH (ref 11.5–15.5)
WBC: 6.3 K/uL (ref 4.0–10.5)
nRBC: 1.1 % — ABNORMAL HIGH (ref 0.0–0.2)

## 2023-10-13 MED ORDER — LUSPATERCEPT-AAMT 75 MG ~~LOC~~ SOLR
1.0000 mg/kg | Freq: Once | SUBCUTANEOUS | Status: AC
  Administered 2023-10-13: 120 mg via SUBCUTANEOUS
  Filled 2023-10-13: qty 1

## 2023-11-02 ENCOUNTER — Encounter: Payer: Self-pay | Admitting: Oncology

## 2023-11-03 ENCOUNTER — Inpatient Hospital Stay

## 2023-11-03 ENCOUNTER — Inpatient Hospital Stay: Attending: Nurse Practitioner

## 2023-11-03 ENCOUNTER — Encounter: Payer: Self-pay | Admitting: Oncology

## 2023-11-03 VITALS — BP 151/75 | HR 73 | Temp 96.7°F | Resp 16 | Wt 254.2 lb

## 2023-11-03 DIAGNOSIS — D461 Refractory anemia with ring sideroblasts: Secondary | ICD-10-CM

## 2023-11-03 LAB — CBC WITH DIFFERENTIAL/PLATELET
Abs Immature Granulocytes: 0.05 K/uL (ref 0.00–0.07)
Basophils Absolute: 0 K/uL (ref 0.0–0.1)
Basophils Relative: 1 %
Eosinophils Absolute: 0.1 K/uL (ref 0.0–0.5)
Eosinophils Relative: 2 %
HCT: 27.3 % — ABNORMAL LOW (ref 39.0–52.0)
Hemoglobin: 8.7 g/dL — ABNORMAL LOW (ref 13.0–17.0)
Immature Granulocytes: 1 %
Lymphocytes Relative: 19 %
Lymphs Abs: 1.1 K/uL (ref 0.7–4.0)
MCH: 31.8 pg (ref 26.0–34.0)
MCHC: 31.9 g/dL (ref 30.0–36.0)
MCV: 99.6 fL (ref 80.0–100.0)
Monocytes Absolute: 0.5 K/uL (ref 0.1–1.0)
Monocytes Relative: 9 %
Neutro Abs: 4 K/uL (ref 1.7–7.7)
Neutrophils Relative %: 68 %
Platelets: 267 K/uL (ref 150–400)
RBC: 2.74 MIL/uL — ABNORMAL LOW (ref 4.22–5.81)
RDW: 25.9 % — ABNORMAL HIGH (ref 11.5–15.5)
WBC: 5.8 K/uL (ref 4.0–10.5)
nRBC: 1 % — ABNORMAL HIGH (ref 0.0–0.2)

## 2023-11-03 MED ORDER — LUSPATERCEPT-AAMT 75 MG ~~LOC~~ SOLR
1.0000 mg/kg | Freq: Once | SUBCUTANEOUS | Status: AC
  Administered 2023-11-03: 120 mg via SUBCUTANEOUS
  Filled 2023-11-03: qty 0.9

## 2023-11-19 ENCOUNTER — Other Ambulatory Visit: Payer: Self-pay | Admitting: Interventional Radiology

## 2023-11-19 DIAGNOSIS — N289 Disorder of kidney and ureter, unspecified: Secondary | ICD-10-CM

## 2023-11-23 ENCOUNTER — Encounter: Payer: Self-pay | Admitting: Oncology

## 2023-11-23 ENCOUNTER — Other Ambulatory Visit: Payer: Self-pay

## 2023-11-23 DIAGNOSIS — D461 Refractory anemia with ring sideroblasts: Secondary | ICD-10-CM

## 2023-11-24 ENCOUNTER — Encounter: Payer: Self-pay | Admitting: Oncology

## 2023-11-24 ENCOUNTER — Inpatient Hospital Stay

## 2023-11-24 ENCOUNTER — Inpatient Hospital Stay: Admitting: Oncology

## 2023-11-24 VITALS — BP 120/68 | HR 73 | Temp 98.0°F | Resp 18 | Ht 70.0 in | Wt 254.7 lb

## 2023-11-24 DIAGNOSIS — D469 Myelodysplastic syndrome, unspecified: Secondary | ICD-10-CM | POA: Diagnosis not present

## 2023-11-24 DIAGNOSIS — D461 Refractory anemia with ring sideroblasts: Secondary | ICD-10-CM

## 2023-11-24 DIAGNOSIS — Z79899 Other long term (current) drug therapy: Secondary | ICD-10-CM

## 2023-11-24 LAB — IRON AND TIBC
Iron: 197 ug/dL — ABNORMAL HIGH (ref 45–182)
Saturation Ratios: 85 % — ABNORMAL HIGH (ref 17.9–39.5)
TIBC: 231 ug/dL — ABNORMAL LOW (ref 250–450)
UIBC: 34 ug/dL

## 2023-11-24 LAB — CBC WITH DIFFERENTIAL (CANCER CENTER ONLY)
Abs Immature Granulocytes: 0.05 K/uL (ref 0.00–0.07)
Basophils Absolute: 0.1 K/uL (ref 0.0–0.1)
Basophils Relative: 1 %
Eosinophils Absolute: 0.2 K/uL (ref 0.0–0.5)
Eosinophils Relative: 3 %
HCT: 29.9 % — ABNORMAL LOW (ref 39.0–52.0)
Hemoglobin: 9.6 g/dL — ABNORMAL LOW (ref 13.0–17.0)
Immature Granulocytes: 1 %
Lymphocytes Relative: 21 %
Lymphs Abs: 1.4 K/uL (ref 0.7–4.0)
MCH: 32.2 pg (ref 26.0–34.0)
MCHC: 32.1 g/dL (ref 30.0–36.0)
MCV: 100.3 fL — ABNORMAL HIGH (ref 80.0–100.0)
Monocytes Absolute: 0.7 K/uL (ref 0.1–1.0)
Monocytes Relative: 10 %
Neutro Abs: 4.3 K/uL (ref 1.7–7.7)
Neutrophils Relative %: 64 %
Platelet Count: 313 K/uL (ref 150–400)
RBC: 2.98 MIL/uL — ABNORMAL LOW (ref 4.22–5.81)
RDW: 26.1 % — ABNORMAL HIGH (ref 11.5–15.5)
WBC Count: 6.6 K/uL (ref 4.0–10.5)
nRBC: 1.5 % — ABNORMAL HIGH (ref 0.0–0.2)

## 2023-11-24 LAB — CMP (CANCER CENTER ONLY)
ALT: 18 U/L (ref 0–44)
AST: 25 U/L (ref 15–41)
Albumin: 3.9 g/dL (ref 3.5–5.0)
Alkaline Phosphatase: 49 U/L (ref 38–126)
Anion gap: 7 (ref 5–15)
BUN: 18 mg/dL (ref 8–23)
CO2: 22 mmol/L (ref 22–32)
Calcium: 8.6 mg/dL — ABNORMAL LOW (ref 8.9–10.3)
Chloride: 106 mmol/L (ref 98–111)
Creatinine: 1.42 mg/dL — ABNORMAL HIGH (ref 0.61–1.24)
GFR, Estimated: 51 mL/min — ABNORMAL LOW (ref >60)
Glucose, Bld: 136 mg/dL — ABNORMAL HIGH (ref 70–99)
Potassium: 4.6 mmol/L (ref 3.5–5.1)
Sodium: 135 mmol/L (ref 135–145)
Total Bilirubin: 2.1 mg/dL — ABNORMAL HIGH (ref 0.0–1.2)
Total Protein: 6.2 g/dL — ABNORMAL LOW (ref 6.5–8.1)

## 2023-11-24 LAB — FERRITIN: Ferritin: 804 ng/mL — ABNORMAL HIGH (ref 24–336)

## 2023-11-24 MED ORDER — LUSPATERCEPT-AAMT 75 MG ~~LOC~~ SOLR
1.0000 mg/kg | Freq: Once | SUBCUTANEOUS | Status: AC
  Administered 2023-11-24: 120 mg via SUBCUTANEOUS
  Filled 2023-11-24: qty 0.9

## 2023-11-24 NOTE — Progress Notes (Signed)
 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 Bone marrow biopsy on 09/11/2019 revealed macrocytic anemia and hypercellular bone marrow for age with dyspoietic changes primarily involving the erythroid cell lines associated with abundant ring sideroblasts (> 15%).  There was no increase in blasts. Flow cytometry was negative. Cytogenetics revealed 45,X,-Y [20].  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 IPSS score is low.   Baseline EPO level was 63.3 in August 2021.  Patient received Aranesp  for a month in September 2021 and was then switched to Retacrit .  He was again switched to aranesp .  Patient had worsening anemia and despite an iron less than therefore was switched to luspatercept  in June 2025   As per insurance preference patient found to have elevated ferritin and when we was under the care of Dr. Rudell underwent hemochromatosis testing in July 2021 which showed single mutation for H63D.  This is being monitored conservatively.  MRI abdomen has shown diffuse iron deposition in the liver but liver enzymes have been normal.  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  Brett Boone is a 78 year old male with myelodysplastic syndrome and SF3B1 mutation who presents for follow-up on luspatercept  treatment.  He has a history of myelodysplastic syndrome with an SF3B1 mutation. Previously treated with EPO injections, his hemoglobin levels remained around eight, leading to a switch to luspatercept . He receives luspatercept  every three weeks at a dose of one milligram per kilogram. His hemoglobin has improved, currently at 9.6, and has mostly been in the nines over the past four months, with a single dip to 8.7 three weeks ago.  After the first luspatercept  injection, he felt significantly better, rating his well-being as a nine out of ten. However, his perceived well-being has since decreased to a range of four to six. His energy levels fluctuate day-to-day, with persistent fatigue impacting his ability to remain active, although he continues to engage in activities like gardening and building.  He is due for an MRI to assess liver iron levels, as he has a history of iron overload. His last ferritin level in August was 907, indicating elevated iron levels. He has not yet started on iron chelation therapy due to potential gastrointestinal side effects.     ECOG PS- 1 Pain scale- 0   Review of systems- Review of Systems  Constitutional:  Positive for malaise/fatigue. Negative for chills, fever and weight loss.  HENT:  Negative for congestion, ear discharge and nosebleeds.   Eyes:  Negative for blurred vision.  Respiratory:  Negative for cough, hemoptysis, sputum production, shortness of breath and wheezing.   Cardiovascular:  Negative for  chest pain, palpitations, orthopnea and claudication.  Gastrointestinal:  Negative for abdominal pain, blood in stool, constipation, diarrhea, heartburn, melena, nausea and vomiting.  Genitourinary:  Negative  for dysuria, flank pain, frequency, hematuria and urgency.  Musculoskeletal:  Negative for back pain, joint pain and myalgias.  Skin:  Negative for rash.  Neurological:  Negative for dizziness, tingling, focal weakness, seizures, weakness and headaches.  Endo/Heme/Allergies:  Does not bruise/bleed easily.  Psychiatric/Behavioral:  Negative for depression and suicidal ideas. The patient does not have insomnia.       Allergies  Allergen Reactions   Amoxicillin-Pot Clavulanate Hives and Nausea And Vomiting     Past Medical History:  Diagnosis Date   Anemia    Arthritis    Heart murmur    Hydrocele    MDS (myelodysplastic syndrome) (HCC)    Rotator cuff tear arthropathy of right shoulder 08/01/2020   Venous stasis dermatitis of both lower extremities      Past Surgical History:  Procedure Laterality Date   CATARACT EXTRACTION W/PHACO Right 03/09/2017   Procedure: CATARACT EXTRACTION PHACO AND INTRAOCULAR LENS PLACEMENT (IOC) RIGHT;  Surgeon: Myrna Adine Anes, MD;  Location: [facility];  Service: Ophthalmology;  Laterality: Right;   CATARACT EXTRACTION W/PHACO Left 08/25/2021   Procedure: CATARACT EXTRACTION PHACO AND INTRAOCULAR LENS PLACEMENT (IOC) LEFT;  Surgeon: Myrna Adine Anes, MD;  Location: [facility];  Service: Ophthalmology;  Laterality: Left;   CATARACT EXTRACTION W/PHACO Left 10/13/2021   Procedure: CATARACT EXTRACTION PHACO AND INTRAOCULAR LENS PLACEMENT (IOC) LEFT;  Surgeon: Myrna Adine Anes, MD;  Location: [facility];  Service: Ophthalmology;  Laterality: Left;  8.12 0:52.0   COLONOSCOPY  2013   normal- cleared for 5 yrs- Dr Parley   COLONOSCOPY WITH PROPOFOL  N/A 02/17/2017   Procedure: COLONOSCOPY WITH PROPOFOL ;  Surgeon: [name], MD;  Location: [facility];  Service: Endoscopy;  Laterality: N/A;   COLONOSCOPY WITH PROPOFOL  N/A 10/12/2019   Procedure: COLONOSCOPY WITH BIOPSY;  Surgeon: [name], MD;   Location: [facility];  Service: Endoscopy;  Laterality: N/A;   EPIGASTRIC HERNIA REPAIR N/A 07/06/2014   Procedure: HERNIA REPAIR EPIGASTRIC ADULT;  Surgeon: [name], MD;  Location: [facility];  Service: General;  Laterality: N/A;   HERNIA REPAIR Bilateral    INSERTION OF MESH N/A 07/06/2014   Procedure: INSERTION OF MESH;  Surgeon: [name], MD;  Location: [facility];  Service: General;  Laterality: N/A;   IR RADIOLOGIST EVAL & MGMT  11/07/2019   IR RADIOLOGIST EVAL & MGMT  04/25/2020   IR RADIOLOGIST EVAL & MGMT  12/05/2020   IR RADIOLOGIST EVAL & MGMT  06/30/2021   IR RADIOLOGIST EVAL & MGMT  08/16/2023   POLYPECTOMY  02/17/2017   Procedure: POLYPECTOMY;  Surgeon: [name], MD;  Location: [facility];  Service: Endoscopy;;   POLYPECTOMY N/A 10/12/2019   Procedure: POLYPECTOMY;  Surgeon: [name], MD;  Location: [facility];  Service: Endoscopy;  Laterality: N/A;   SHOULDER ARTHROSCOPY Bilateral 2008   SINUS EXPLORATION      Social History   Socioeconomic History   Marital status: Married    Spouse name: Yandel, Zeiner (Spouse)   Number of children: 3   Years of education: Not on file   Highest education level: Bachelor's degree (e.g., BA, AB, BS)  Occupational History   Occupation: Retired  Tobacco Use   Smoking status: Former    Current packs/day: 0.00    Average packs/day:  0.3 packs/day for 50.0 years (12.5 ttl pk-yrs)    Types: Cigarettes    Start date: 05/02/1968    Quit date: 05/03/2018    Years since quitting: 5.5   Smokeless tobacco: Former   Tobacco comments:    pt quit smoking  Vaping Use   Vaping status: Never Used  Substance and Sexual Activity   Alcohol use: Yes    Comment: Occasional -1x/month   Drug use: No   Sexual activity: Yes  Other Topics Concern   Not on file  Social History Narrative   Not on file   Social Drivers of Health   Financial Resource Strain: Low Risk  (04/28/2023)    Overall Financial Resource Strain (CARDIA)    Difficulty of Paying Living Expenses: Not hard at all  Food Insecurity: No Food Insecurity (04/28/2023)   Hunger Vital Sign    Worried About Running Out of Food in the Last Year: Never true    Ran Out of Food in the Last Year: Never true  Transportation Needs: No Transportation Needs (04/28/2023)   PRAPARE - Administrator, Civil Service (Medical): No    Lack of Transportation (Non-Medical): No  Physical Activity: Sufficiently Active (04/28/2023)   Exercise Vital Sign    Days of Exercise per Week: 3 days    Minutes of Exercise per Session: 60 min  Stress: No Stress Concern Present (04/28/2023)   [employer] of Occupational Health - Occupational Stress Questionnaire    Feeling of Stress : Not at all  Social Connections: Moderately Integrated (04/28/2023)   Social Connection and Isolation Panel    Frequency of Communication with Friends and Family: More than three times a week    Frequency of Social Gatherings with Friends and Family: Once a week    Attends Religious Services: More than 4 times per year    Active Member of [employer] or Organizations: No    Attends Banker Meetings: Never    Marital Status: Married  Catering Manager Violence: Not At Risk (04/28/2023)   Humiliation, Afraid, Rape, and Kick questionnaire    Fear of Current or Ex-Partner: No    Emotionally Abused: No    Physically Abused: No    Sexually Abused: No    Family History  Problem Relation Age of Onset   Diabetes Father    Heart disease Father 11   Stroke Mother      Current Outpatient Medications:    acetaminophen  (TYLENOL ) 325 MG tablet, Take 650 mg by mouth every 6 (six) hours as needed., Disp: , Rfl:    Calcium  Carb-Cholecalciferol 600-400 MG-UNIT TABS, Take by mouth., Disp: , Rfl:    ibuprofen (ADVIL) 200 MG tablet, Take 200 mg by mouth every 6 (six) hours as needed., Disp: , Rfl:    LUSPATERCEPT -AAMT Speed, Inject into the skin every 21 (  twenty-one) days., Disp: , Rfl:    tadalafil  (CIALIS ) 5 MG tablet, Take 1 tablet (5 mg total) by mouth daily. Ok to take additional 5-15mg  as needed 1 hour prior to sexual activity, Disp: 90 tablet, Rfl: 6 No current facility-administered medications for this visit.  Facility-Administered Medications Ordered in Other Visits:    Darbepoetin Alfa  (ARANESP ) injection 500 mcg, 500 mcg, Subcutaneous, Once, [name], MD   Darbepoetin Alfa  (ARANESP ) injection 600 mcg, 600 mcg, Subcutaneous, Q14 Days, [name], MD, 600 mcg at 06/12/22 1052  Physical exam:  Vitals:   11/24/23 0949  BP: 120/68  Pulse: 73  Resp:  18  Temp: 98 F (36.7 C)  TempSrc: Tympanic  SpO2: 99%  Weight: 254 lb 11.2 oz (115.5 kg)  Height: 5' 10 (1.778 m)   Physical Exam Cardiovascular:     Rate and Rhythm: Normal rate and regular rhythm.     Heart sounds: Normal heart sounds.  Pulmonary:     Effort: Pulmonary effort is normal.     Breath sounds: Normal breath sounds.  Skin:    General: Skin is warm and dry.  Neurological:     Mental Status: He is alert and oriented to person, place, and time.      I have personally reviewed labs listed below:    Latest Ref Rng & Units 11/24/2023    9:39 AM  CMP  Glucose 70 - 99 mg/dL 863   BUN 8 - 23 mg/dL 18   Creatinine 9.38 - 1.24 mg/dL 8.57   Sodium 864 - 854 mmol/L 135   Potassium 3.5 - 5.1 mmol/L 4.6   Chloride 98 - 111 mmol/L 106   CO2 22 - 32 mmol/L 22   Calcium  8.9 - 10.3 mg/dL 8.6   Total Protein 6.5 - 8.1 g/dL 6.2   Total Bilirubin 0.0 - 1.2 mg/dL 2.1   Alkaline Phos 38 - 126 U/L 49   AST 15 - 41 U/L 25   ALT 0 - 44 U/L 18       Latest Ref Rng & Units 11/24/2023    9:39 AM  CBC  WBC 4.0 - 10.5 K/uL 6.6   Hemoglobin 13.0 - 17.0 g/dL 9.6   Hematocrit 60.9 - 52.0 % 29.9   Platelets 150 - 400 K/uL 313     Assessment and plan- Patient is a 78 y.o. 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  Before luspatercept  patient's hemoglobin was around 8 when he was on EPO.  We also have room to go up on luspatercept  should he not have good response in the future.   - Administer luspatercept  today. - Adjust administration to every four weeks instead of every 3 weeks. - Monitor hemoglobin levels. - Consider dose increase if hemoglobin drops.  Fatigue related to myelodysplastic syndrome Fatigue persists despite improved hemoglobin, likely due to MDS and anemia. - Continue luspatercept  and monitor fatigue.  Iron overload secondary to myelodysplastic syndrome Elevated ferritin (907), H63D mutation present. No iron chelation due to GI side effects, hoping luspatercept  improves erythropoiesis. - Check ferritin levels today. - Review MRI results in two weeks. - Consider Desferal if ferritin remains above 500-600.  Elevated bilirubin Chronic mild elevation, no significant liver dysfunction. - Monitor bilirubin levels routinely.         Visit Diagnosis 1. Refractory anemia with ring sideroblasts (HCC)   2. Myelodysplastic syndrome (HCC)   3. High risk medication use      Dr. Annah Skene, MD, MPH [facility] [phone] 11/24/2023 1:01 PM

## 2023-11-24 NOTE — Progress Notes (Signed)
 C/o not feeling good, tired and little more dizzy.. First 3 weeks after switching he was doing better 9/10, now a 4/10 as far as how he feels.

## 2023-12-06 ENCOUNTER — Ambulatory Visit
Admission: RE | Admit: 2023-12-06 | Discharge: 2023-12-06 | Disposition: A | Discharge status: Home / Self Care | Source: Ambulatory Visit | Attending: Interventional Radiology | Admitting: Interventional Radiology

## 2023-12-06 DIAGNOSIS — N289 Disorder of kidney and ureter, unspecified: Secondary | ICD-10-CM | POA: Insufficient documentation

## 2023-12-06 MED ORDER — GADOBUTROL 1 MMOL/ML IV SOLN
10.0000 mL | Freq: Once | INTRAVENOUS | Status: AC | PRN
  Administered 2023-12-06: 10 mL via INTRAVENOUS

## 2023-12-14 ENCOUNTER — Ambulatory Visit
Admission: RE | Admit: 2023-12-14 | Discharge: 2023-12-14 | Disposition: A | Source: Ambulatory Visit | Attending: Urology

## 2023-12-14 DIAGNOSIS — N289 Disorder of kidney and ureter, unspecified: Secondary | ICD-10-CM

## 2023-12-14 HISTORY — PX: IR RADIOLOGIST EVAL & MGMT: IMG5224

## 2023-12-14 NOTE — Progress Notes (Signed)
 Chief Complaint: Patient was seen in consultation today for follow up after cryoablation of left renal carcinoma.  History of Present Illness: Brett Boone is a 78 y.o. male status post biopsy and cryoablation of a 2 cm left renal carcinoma on 09/16/2023. 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 The procedure was well tolerated with no complication. After the procedure he experienced no pain. He denies urinary symptoms or fever. He has no limitations in activity.  A follow up MRI of the abdomen was performed on 12/06/23.  Past Medical History:  Diagnosis Date   Anemia    Arthritis    Heart murmur    Hydrocele    MDS (myelodysplastic syndrome) (HCC)    Rotator cuff tear arthropathy of right shoulder 08/01/2020   Venous stasis dermatitis of both lower extremities     Past Surgical History:  Procedure Laterality Date   CATARACT EXTRACTION W/PHACO Right 03/09/2017   Procedure: CATARACT EXTRACTION PHACO AND INTRAOCULAR LENS PLACEMENT (IOC) RIGHT;  Surgeon: Myrna Adine Anes, MD;  Location: [facility];  Service: Ophthalmology;  Laterality: Right;   CATARACT EXTRACTION W/PHACO Left 08/25/2021   Procedure: CATARACT EXTRACTION PHACO AND INTRAOCULAR LENS PLACEMENT (IOC) LEFT;  Surgeon: Myrna Adine Anes, MD;  Location: [facility];  Service: Ophthalmology;  Laterality: Left;   CATARACT EXTRACTION W/PHACO Left 10/13/2021   Procedure: CATARACT EXTRACTION PHACO AND INTRAOCULAR LENS PLACEMENT (IOC) LEFT;  Surgeon: Myrna Adine Anes, MD;  Location: [facility];  Service: Ophthalmology;  Laterality: Left;  8.12 0:52.0   COLONOSCOPY  2013   normal- cleared for 5 yrs- Dr Parley   COLONOSCOPY WITH PROPOFOL  N/A 02/17/2017   Procedure: COLONOSCOPY WITH PROPOFOL ;  Surgeon: [name], MD;  Location: [facility];  Service: Endoscopy;  Laterality: N/A;   COLONOSCOPY WITH PROPOFOL  N/A 10/12/2019    Procedure: COLONOSCOPY WITH BIOPSY;  Surgeon: [name], MD;  Location: [facility];  Service: Endoscopy;  Laterality: N/A;   EPIGASTRIC HERNIA REPAIR N/A 07/06/2014   Procedure: HERNIA REPAIR EPIGASTRIC ADULT;  Surgeon: [name], MD;  Location: [facility];  Service: General;  Laterality: N/A;   HERNIA REPAIR Bilateral    INSERTION OF MESH N/A 07/06/2014   Procedure: INSERTION OF MESH;  Surgeon: [name], MD;  Location: [facility];  Service: General;  Laterality: N/A;   IR RADIOLOGIST EVAL & MGMT  11/07/2019   IR RADIOLOGIST EVAL & MGMT  04/25/2020   IR RADIOLOGIST EVAL & MGMT  12/05/2020   IR RADIOLOGIST EVAL & MGMT  06/30/2021   IR RADIOLOGIST EVAL & MGMT  08/16/2023   POLYPECTOMY  02/17/2017   Procedure: POLYPECTOMY;  Surgeon: [name], MD;  Location: [facility];  Service: Endoscopy;;   POLYPECTOMY N/A 10/12/2019   Procedure: POLYPECTOMY;  Surgeon: [name], MD;  Location: [facility];  Service: Endoscopy;  Laterality: N/A;   SHOULDER ARTHROSCOPY Bilateral 2008   SINUS EXPLORATION      Allergies: Amoxicillin-pot clavulanate  Medications: Prior to Admission medications   Medication Sig Start Date End Date Taking? Authorizing Provider  acetaminophen  (TYLENOL ) 325 MG tablet Take 650 mg by mouth every 6 (six) hours as needed.    [provider]  Calcium  Carb-Cholecalciferol 600-400 MG-UNIT TABS Take by mouth.    [provider]  ibuprofen (ADVIL) 200 MG tablet Take 200 mg by mouth every 6 (six) hours as needed.    [provider]  LUSPATERCEPT -AAMT Ute Inject into the skin every 21 ( twenty-one) days.    [provider]  tadalafil  (CIALIS ) 5 MG tablet Take 1 tablet (5 mg total) by mouth daily. Ok to take additional 5-15mg  as needed 1 hour prior to sexual activity 09/02/23   Francisca Redell BROCKS, MD     Family History  Problem Relation Age of Onset   Diabetes Father    Heart disease  Father 64   Stroke Mother     Social History   Socioeconomic History   Marital status: Married    Spouse name: Garhett, Bernhard (Spouse)   Number of children: 3   Years of education: Not on file   Highest education level: Bachelor's degree (e.g., BA, AB, BS)  Occupational History   Occupation: Retired  Tobacco Use   Smoking status: Former    Current packs/day: 0.00    Average packs/day: 0.3 packs/day for 50.0 years (12.5 ttl pk-yrs)    Types: Cigarettes    Start date: 05/02/1968    Quit date: 05/03/2018    Years since quitting: 5.6   Smokeless tobacco: Former   Tobacco comments:    pt quit smoking  Vaping Use   Vaping status: Never Used  Substance and Sexual Activity   Alcohol use: Yes    Comment: Occasional -1x/month   Drug use: No   Sexual activity: Yes  Other Topics Concern   Not on file  Social History Narrative   Not on file   Social Drivers of Health   Financial Resource Strain: Low Risk  (04/28/2023)   Overall Financial Resource Strain (CARDIA)    Difficulty of Paying Living Expenses: Not hard at all  Food Insecurity: No Food Insecurity (04/28/2023)   Hunger Vital Sign    Worried About Running Out of Food in the Last Year: Never true    Ran Out of Food in the Last Year: Never true  Transportation Needs: No Transportation Needs (04/28/2023)   PRAPARE - Administrator, Civil Service (Medical): No    Lack of Transportation (Non-Medical): No  Physical Activity: Sufficiently Active (04/28/2023)   Exercise Vital Sign    Days of Exercise per Week: 3 days    Minutes of Exercise per Session: 60 min  Stress: No Stress Concern Present (04/28/2023)   [employer] of Occupational Health - Occupational Stress Questionnaire    Feeling of Stress : Not at all  Social Connections: Moderately Integrated (04/28/2023)   Social Connection and Isolation Panel    Frequency of Communication with Friends and Family: More than three times a week    Frequency of Social  Gatherings with Friends and Family: Once a week    Attends Religious Services: More than 4 times per year    Active Member of [employer] or Organizations: No    Attends Banker Meetings: Never    Marital Status: Married    ECOG Status: 0 - Asymptomatic  Review of Systems: A 12 point ROS discussed and pertinent positives are indicated in the HPI above.  All other systems are negative.  Review of Systems  Constitutional: Negative.   Respiratory: Negative.    Cardiovascular: Negative.   Gastrointestinal: Negative.   Genitourinary: Negative.   Musculoskeletal: Negative.   Neurological: Negative.     Vital Signs: BP 125/63   Pulse 85   Temp 98.5 F (36.9 C)   Resp 16   Wt 114.3 kg   SpO2 99%   BMI 36.16 kg/m  Physical Exam Vitals reviewed.  Constitutional:      General: He is not in acute distress.    Appearance: Normal appearance. He is not ill-appearing, toxic-appearing or diaphoretic.  Abdominal:     General: There is no distension.     Palpations: Abdomen is soft. There is no mass.     Tenderness: There is no abdominal tenderness. There is no right CVA tenderness, left CVA tenderness, guarding or rebound.  Musculoskeletal:        General: No swelling.  Neurological:     General: No focal deficit present.     Mental Status: He is alert and oriented to person, place, and time.      Imaging: MR ABDOMEN WWO CONTRAST Result Date: 12/06/2023 CLINICAL DATA:  Follow-up renal lesion.  Refractory anemia. EXAM: MRI ABDOMEN WITHOUT AND WITH CONTRAST TECHNIQUE: Multiplanar multisequence MR imaging of the abdomen was performed both before and after the administration of intravenous contrast. CONTRAST:  10mL GADAVIST  GADOBUTROL  1 MMOL/ML IV SOLN COMPARISON:  MRI abdomen from 06/16/2023. FINDINGS: Lower chest: Unremarkable MR appearance to the lung bases. No pleural effusion. No pericardial effusion. Normal heart size. Hepatobiliary: The liver is normal in size.  Noncirrhotic configuration. There is T2 hypointense appearance of the liver which exhibits loss of signal on in-phase images, in comparison to the out-of-phase images, favoring deposition of paramagnetic substances such as iron, melanin, etc. No focal liver lesion. There is also associated diffuse hepatic steatosis and focal area of fatty sparing in the left hepatic lobe, segment 4B (series 5, image 39). No intrahepatic or extrahepatic bile duct dilatation. No choledocholithiasis. Physiologically distended gallbladder. Small volume gallstones/sludge noted without imaging signs of acute cholecystitis. 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 No suspicious pancreatic lesion. Main pancreatic duct is not dilated. No peripancreatic fat stranding. Spleen: Normal in size. 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 Adrenals/Urinary Tract: Unremarkable adrenal glands. No hydroureteronephrosis. Since the prior study, patient underwent cryoablation of the mass in the left kidney upper pole, anteriorly. The area currently measures up to 1.4 x 2.0 cm and exhibit new T1 hyperintense areas. There is no abnormal enhancement on the postcontrast images. Findings favor post cryoablation coagulative necrosis without viable, residual or recurrent tumor. There is an additional predominantly exophytic 2.0 x 2.2 cm proteinaceous/hemorrhagic cyst arising from the left kidney lower pole, laterally. No suspicious renal mass on either side. Stomach/Bowel: There is a small sliding hiatal hernia. Visualized portions within the abdomen are unremarkable. No disproportionate dilation of bowel loops. There are multiple colonic diverticula without diverticulitis. Vascular/Lymphatic: No pathologically enlarged lymph nodes identified. No abdominal aortic aneurysm demonstrated. No ascites. Other:   None. Musculoskeletal: No suspicious bone lesions identified. IMPRESSION: 1. There is a 1.4 x 2.0 cm area in the left kidney upper pole, anteriorly, which exhibits new T1 hyperintense areas and no abnormal enhancement on the postcontrast images. Findings favor post cryoablation coagulative necrosis without viable, residual or recurrent tumor. No suspicious renal mass on either side. 2. 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 3. 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 4. There is diffuse hepatic steatosis and focal area of fatty sparing in the left hepatic lobe, segment 4B. 5. Multiple other nonacute observations, as described above. Electronically Signed  By: Ree Molt M.D.   On: 12/06/2023 16:46    Labs:  CBC: Recent Labs    09/14/23 1154 09/22/23 1030 10/13/23 1012 11/03/23 1018 11/24/23 0939  WBC 6.4  --  6.3 5.8 6.6  HGB 9.2* 9.0* 9.3* 8.7* 9.6*  HCT 27.6* 28.7* 29.1* 27.3* 29.9*  PLT 273  --  262 267 313    COAGS: Recent Labs    09/14/23 1154  INR 1.2    BMP: Recent Labs    08/11/23 1128 09/01/23 0909 09/14/23 1154 11/24/23 0939  NA 134* 135 138 135  K 4.4 4.3 4.8 4.6  CL 106 104 106 106  CO2 22 24 23 22   GLUCOSE 144* 144* 104* 136*  BUN 20 20 24* 18  CALCIUM  8.5* 8.6* 9.0 8.6*  CREATININE 1.37* 1.36* 1.18 1.42*  GFRNONAA 53* 54* >60 51*    LIVER FUNCTION TESTS: Recent Labs    07/21/23 1241 08/11/23 1128 09/01/23 0909 11/24/23 0939  BILITOT 1.7* 2.0* 1.9* 2.1*  AST 23 28 25 25   ALT 17 21 20 18   ALKPHOS 42 47 46 49  PROT 6.2* 6.2* 6.1* 6.2*  ALBUMIN 4.0 4.1 3.9 3.9     Assessment and Plan:  I met with Brett Boone and his wife. We reviewed biopsy and MRI findings. The follow up MRI shows post ablation changes of the left kidney with no evidence of enhancement to suggest recurrence of the ablated  carcinoma. Hemorrhagic cyst of the lower pole of the left kidney is stable. No new renal lesions.  I recommended a follow up MRI in one year. I will meet with him after that MRI.   Electronically Signed: Marcey ONEIDA Moan 12/14/2023, 9:41 AM    I spent a total of  10 Minutes in face to face in clinical consultation, greater than 50% of which was counseling/coordinating care post cryoablation of a left clear cell renal carcinoma.

## 2023-12-22 ENCOUNTER — Inpatient Hospital Stay

## 2023-12-22 ENCOUNTER — Inpatient Hospital Stay: Attending: Nurse Practitioner

## 2023-12-22 VITALS — BP 130/75

## 2023-12-22 DIAGNOSIS — D461 Refractory anemia with ring sideroblasts: Secondary | ICD-10-CM

## 2023-12-22 LAB — CBC WITH DIFFERENTIAL/PLATELET
Abs Immature Granulocytes: 0.09 K/uL — ABNORMAL HIGH (ref 0.00–0.07)
Basophils Absolute: 0.1 K/uL (ref 0.0–0.1)
Basophils Relative: 1 %
Eosinophils Absolute: 0.1 K/uL (ref 0.0–0.5)
Eosinophils Relative: 1 %
HCT: 26.2 % — ABNORMAL LOW (ref 39.0–52.0)
Hemoglobin: 8.5 g/dL — ABNORMAL LOW (ref 13.0–17.0)
Immature Granulocytes: 1 %
Lymphocytes Relative: 13 %
Lymphs Abs: 1.2 K/uL (ref 0.7–4.0)
MCH: 32.2 pg (ref 26.0–34.0)
MCHC: 32.4 g/dL (ref 30.0–36.0)
MCV: 99.2 fL (ref 80.0–100.0)
Monocytes Absolute: 0.8 K/uL (ref 0.1–1.0)
Monocytes Relative: 9 %
Neutro Abs: 7.4 K/uL (ref 1.7–7.7)
Neutrophils Relative %: 75 %
Platelets: 288 K/uL (ref 150–400)
RBC: 2.64 MIL/uL — ABNORMAL LOW (ref 4.22–5.81)
RDW: 26.5 % — ABNORMAL HIGH (ref 11.5–15.5)
WBC: 9.7 K/uL (ref 4.0–10.5)
nRBC: 0.9 % — ABNORMAL HIGH (ref 0.0–0.2)

## 2023-12-22 MED ORDER — LUSPATERCEPT-AAMT 75 MG ~~LOC~~ SOLR
1.0000 mg/kg | Freq: Once | SUBCUTANEOUS | Status: AC
  Administered 2023-12-22: 120 mg via SUBCUTANEOUS
  Filled 2023-12-22: qty 1.5

## 2024-01-18 ENCOUNTER — Inpatient Hospital Stay: Attending: Nurse Practitioner

## 2024-01-18 ENCOUNTER — Inpatient Hospital Stay

## 2024-01-18 ENCOUNTER — Inpatient Hospital Stay: Admitting: Oncology

## 2024-01-18 ENCOUNTER — Encounter: Payer: Self-pay | Admitting: Oncology

## 2024-01-18 DIAGNOSIS — D461 Refractory anemia with ring sideroblasts: Secondary | ICD-10-CM

## 2024-01-18 DIAGNOSIS — D469 Myelodysplastic syndrome, unspecified: Secondary | ICD-10-CM | POA: Diagnosis not present

## 2024-01-18 DIAGNOSIS — Z79899 Other long term (current) drug therapy: Secondary | ICD-10-CM

## 2024-01-18 LAB — CMP (CANCER CENTER ONLY)
ALT: 22 U/L (ref 0–44)
AST: 29 U/L (ref 15–41)
Albumin: 4.3 g/dL (ref 3.5–5.0)
Alkaline Phosphatase: 54 U/L (ref 38–126)
Anion gap: 9 (ref 5–15)
BUN: 18 mg/dL (ref 8–23)
CO2: 25 mmol/L (ref 22–32)
Calcium: 9.4 mg/dL (ref 8.9–10.3)
Chloride: 104 mmol/L (ref 98–111)
Creatinine: 1.3 mg/dL — ABNORMAL HIGH (ref 0.61–1.24)
GFR, Estimated: 57 mL/min — ABNORMAL LOW
Glucose, Bld: 118 mg/dL — ABNORMAL HIGH (ref 70–99)
Potassium: 4.2 mmol/L (ref 3.5–5.1)
Sodium: 138 mmol/L (ref 135–145)
Total Bilirubin: 1.9 mg/dL — ABNORMAL HIGH (ref 0.0–1.2)
Total Protein: 6.3 g/dL — ABNORMAL LOW (ref 6.5–8.1)

## 2024-01-18 LAB — CBC WITH DIFFERENTIAL/PLATELET
Abs Immature Granulocytes: 0.04 K/uL (ref 0.00–0.07)
Basophils Absolute: 0.1 K/uL (ref 0.0–0.1)
Basophils Relative: 1 %
Eosinophils Absolute: 0.1 K/uL (ref 0.0–0.5)
Eosinophils Relative: 1 %
HCT: 28.7 % — ABNORMAL LOW (ref 39.0–52.0)
Hemoglobin: 9.2 g/dL — ABNORMAL LOW (ref 13.0–17.0)
Immature Granulocytes: 1 %
Lymphocytes Relative: 21 %
Lymphs Abs: 1.3 K/uL (ref 0.7–4.0)
MCH: 32.4 pg (ref 26.0–34.0)
MCHC: 32.1 g/dL (ref 30.0–36.0)
MCV: 101.1 fL — ABNORMAL HIGH (ref 80.0–100.0)
Monocytes Absolute: 0.6 K/uL (ref 0.1–1.0)
Monocytes Relative: 10 %
Neutro Abs: 4.2 K/uL (ref 1.7–7.7)
Neutrophils Relative %: 66 %
Platelets: 302 K/uL (ref 150–400)
RBC: 2.84 MIL/uL — ABNORMAL LOW (ref 4.22–5.81)
RDW: 26.3 % — ABNORMAL HIGH (ref 11.5–15.5)
WBC: 6.2 K/uL (ref 4.0–10.5)
nRBC: 1.3 % — ABNORMAL HIGH (ref 0.0–0.2)

## 2024-01-18 LAB — FERRITIN: Ferritin: 1135 ng/mL — ABNORMAL HIGH (ref 24–336)

## 2024-01-18 LAB — IRON AND TIBC
Iron: 198 ug/dL — ABNORMAL HIGH (ref 45–182)
Saturation Ratios: 86 % — ABNORMAL HIGH (ref 17.9–39.5)
TIBC: 230 ug/dL — ABNORMAL LOW (ref 250–450)
UIBC: 32 ug/dL

## 2024-01-18 MED ORDER — LUSPATERCEPT-AAMT 75 MG ~~LOC~~ SOLR
1.0000 mg/kg | Freq: Once | SUBCUTANEOUS | Status: AC
  Administered 2024-01-18: 120 mg via SUBCUTANEOUS
  Filled 2024-01-18: qty 1

## 2024-01-18 NOTE — Progress Notes (Signed)
 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 Bone marrow biopsy on 09/11/2019 revealed macrocytic anemia and hypercellular bone marrow for age with dyspoietic changes primarily involving the erythroid cell lines associated with abundant ring sideroblasts (> 15%).  There was no increase in blasts. Flow cytometry was negative. Cytogenetics revealed 45,X,-Y [20].  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 IPSS score is low.   Baseline EPO level was 63.3 in August 2021.  Patient received Aranesp  for a month in September 2021 and was then switched to Retacrit .  He was again switched to aranesp .  Patient had worsening anemia and despite an iron less than therefore was switched to luspatercept  in June 2025   As per insurance preference patient found to have elevated ferritin and when we was under the care of Dr. Rudell underwent hemochromatosis testing in July 2021 which showed single mutation for H63D.  This is being monitored conservatively.  MRI abdomen has shown diffuse iron deposition in the liver but liver enzymes have been normal.  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  He is receiving luspatercept  injections at the lowest dose every four weeks for MDS-associated anemia. Hemoglobin has improved from 8.5 g/dL three weeks ago to 9.2 g/dL today. He previously received Aranesp , but dosing was limited by insurance. He describes his quality of life as 'okay,' but notes he does not feel as well as during the first ten days after his initial luspatercept  injection, with symptoms described as 'up and down.'  He has chronic iron overload, with ferritin levels fluctuating between 700 and 900 ng/mL over the past four years. Ferritin has remained stable since 2022.  He inquired about a possible relationship between hepatitis and sleep disturbance.      History of Present Illness  ECOG PS- 1 Pain scale- 0   Review of systems- Review of Systems  Constitutional:  Negative for chills, fever, malaise/fatigue and weight loss.  HENT:  Negative for congestion, ear discharge and nosebleeds.   Eyes:  Negative for blurred vision.  Respiratory:  Negative for cough, hemoptysis, sputum production, shortness of breath and wheezing.   Cardiovascular:  Negative for chest pain, palpitations, orthopnea and claudication.  Gastrointestinal:  Negative for abdominal pain, blood in stool, constipation, diarrhea, heartburn, melena, nausea and vomiting.  Genitourinary:  Negative for dysuria, flank pain, frequency, hematuria and urgency.  Musculoskeletal:  Negative for back pain, joint pain and myalgias.  Skin:  Negative for rash.  Neurological:  Negative for dizziness, tingling, focal weakness, seizures, weakness and headaches.  Endo/Heme/Allergies:  Does not bruise/bleed easily.  Psychiatric/Behavioral:  Negative for depression  and suicidal ideas. The patient does not have insomnia.       Allergies[1]   Past Medical  History:  Diagnosis Date   Anemia    Arthritis    Heart murmur    Hydrocele    MDS (myelodysplastic syndrome) (HCC)    Rotator cuff tear arthropathy of right shoulder 08/01/2020   Venous stasis dermatitis of both lower extremities      Past Surgical History:  Procedure Laterality Date   CATARACT EXTRACTION W/PHACO Right 03/09/2017   Procedure: CATARACT EXTRACTION PHACO AND INTRAOCULAR LENS PLACEMENT (IOC) RIGHT;  Surgeon: Myrna Adine Anes, MD;  Location[facility];  Service: Ophthalmology;  Laterality: Right;   CATARACT EXTRACTION W/PHACO Left 08/25/2021   Procedure: CATARACT EXTRACTION PHACO AND INTRAOCULAR LENS PLACEMENT (IOC) LEFT;  Surgeon: Myrna Adine Anes, MD;  Location: [facility];  Service: Ophthalmology;  Laterality: Left;   CATARACT EXTRACTION W/PHACO Left 10/13/2021   Procedure: CATARACT EXTRACTION PHACO AND INTRAOCULAR LENS PLACEMENT (IOC) LEFT;  Surgeon: Myrna Adine Anes, MD;  Location: [facility];  Service: Ophthalmology;  Laterality: Left;  8.12 0:52.0   COLONOSCOPY  2013   normal- cleared for 5 yrs- Dr Parley   COLONOSCOPY WITH PROPOFOL  N/A 02/17/2017   Procedure: COLONOSCOPY WITH PROPOFOL ;  Surgeon: [name], MD;  Location: [facility];  Service: Endoscopy;  Laterality: N/A;   COLONOSCOPY WITH PROPOFOL  N/A 10/12/2019   Procedure: COLONOSCOPY WITH BIOPSY;  Surgeon: [name], MD;  Location: [facility];  Service: Endoscopy;  Laterality: N/A;   EPIGASTRIC HERNIA REPAIR N/A 07/06/2014   Procedure: HERNIA REPAIR EPIGASTRIC ADULT;  Surgeon: [name], MD;  Location: [facility];  Service: General;  Laterality: N/A;   HERNIA REPAIR Bilateral    INSERTION OF MESH N/A 07/06/2014   Procedure: INSERTION OF MESH;  Surgeon: [name], MD;  Location: [facility];  Service: General;  Laterality: N/A;   IR RADIOLOGIST EVAL & MGMT  11/07/2019   IR RADIOLOGIST EVAL & MGMT  04/25/2020   IR RADIOLOGIST  EVAL & MGMT  12/05/2020   IR RADIOLOGIST EVAL & MGMT  06/30/2021   IR RADIOLOGIST EVAL & MGMT  08/16/2023   IR RADIOLOGIST EVAL & MGMT  12/14/2023   POLYPECTOMY  02/17/2017   Procedure: POLYPECTOMY;  Surgeon: [name], MD;  Location: [facility];  Service: Endoscopy;;   POLYPECTOMY N/A 10/12/2019   Procedure: POLYPECTOMY;  Surgeon: [name], MD;  Location: [facility];  Service: Endoscopy;  Laterality: N/A;   SHOULDER ARTHROSCOPY Bilateral 2008   SINUS EXPLORATION      Social History   Socioeconomic History   Marital status: Married    Spouse name: Robin, Pafford (Spouse)   Number of children: 3   Years of education: Not on file   Highest education level: Bachelor's degree (e.g., BA, AB, BS)  Occupational History   Occupation: Retired  Tobacco Use   Smoking status: Former    Current packs/day: 0.00    Average packs/day: 0.3 packs/day for 50.0 years (12.5 ttl pk-yrs)    Types: Cigarettes    Start date: 05/02/1968    Quit date: 05/03/2018    Years since quitting: 5.7   Smokeless tobacco: Former   Tobacco comments:    pt quit smoking  Vaping Use   Vaping status: Never Used  Substance and Sexual Activity   Alcohol use: Yes    Comment: Occasional -1x/month   Drug use: No   Sexual activity: Yes  Other Topics Concern   Not on file  Social History Narrative   Not on file   Social Drivers of Health   Tobacco Use: Medium Risk (01/18/2024)   Patient History    Smoking Tobacco Use: Former    Smokeless Tobacco Use: Former    Passive Exposure: Not on Actuary Strain: Low Risk  (12/27/2023)   Received from [facility]   Overall Financial Resource Strain (CARDIA)    Difficulty of Paying Living Expenses: Not hard at all  Food Insecurity: No Food Insecurity (12/27/2023)   Received from [facility]   Epic    Within the past 12 months, you worried that your food would run out before you got  the money to buy more.: Never true    Within the past 12 months, the food you bought just didn't last and you didn't have money to get more.: Never true  Transportation Needs: No Transportation Needs (12/27/2023)   Received from [facility] - Transportation    In the past 12 months, has lack of transportation kept you from medical appointments or from getting medications?: No    Lack of Transportation (Non-Medical): No  Physical Activity: Sufficiently Active (04/28/2023)   Exercise Vital Sign    Days of Exercise per Week: 3 days    Minutes of Exercise per Session: 60 min  Stress: No Stress Concern Present (04/28/2023)   [employer] of Occupational Health - Occupational Stress Questionnaire    Feeling of Stress : Not at all  Social Connections: Moderately Integrated (04/28/2023)   Social Connection and Isolation Panel    Frequency of Communication with Friends and Family: More than three times a week    Frequency of Social Gatherings with Friends and Family: Once a week    Attends Religious Services: More than 4 times per year    Active Member of [employer] or Organizations: No    Attends Banker Meetings: Never    Marital Status: Married  Catering Manager Violence: Not At Risk (04/28/2023)   Humiliation, Afraid, Rape, and Kick questionnaire    Fear of Current or Ex-Partner: No    Emotionally Abused: No    Physically Abused: No    Sexually Abused: No  Depression (PHQ2-9): Low Risk (11/24/2023)   Depression (PHQ2-9)    PHQ-2 Score: 0  Alcohol Screen: Low Risk (04/28/2023)   Alcohol Screen    Last Alcohol Screening Score (AUDIT): 0  Housing: Low Risk  (12/27/2023)   Received from [facility]   Epic    In the last 12 months, was there a time when you were not able to pay the mortgage or rent on time?: No    In the past 12 months, how many times have you moved where you were living?: 0    At any time in the past 12 months, were you  homeless or living in a shelter (including now)?: No  Utilities: Not At Risk (12/27/2023)   Received from [facility]   Epic    In the past 12 months has the electric, gas, oil, or water  company threatened to shut off services in your home?: No  Health Literacy: Adequate Health Literacy (04/28/2023)   B1300 Health Literacy    Frequency of need for help with medical instructions: Never    Family History  Problem Relation Age of Onset   Diabetes Father    Heart disease Father  30   Stroke Mother     Current Medications[2]  Physical exam:  Vitals:   01/18/24 1125  BP: 135/68  Pulse: 68  Resp: 19  Temp: 97.6 F (36.4 C)  SpO2: 98%  Weight: 253 lb 3.2 oz (114.9 kg)   Physical Exam Cardiovascular:     Rate and Rhythm: Normal rate and regular rhythm.     Heart sounds: Normal heart sounds.  Pulmonary:     Effort: Pulmonary effort is normal.     Breath sounds: Normal breath sounds.  Skin:    General: Skin is warm and dry.  Neurological:     Mental Status: He is alert and oriented to person, place, and time.      I have personally reviewed labs listed below:    Latest Ref Rng & Units 01/18/2024   10:58 AM  CMP  Glucose 70 - 99 mg/dL 881   BUN 8 - 23 mg/dL 18   Creatinine 9.38 - 1.24 mg/dL 8.69   Sodium 864 - 854 mmol/L 138   Potassium 3.5 - 5.1 mmol/L 4.2   Chloride 98 - 111 mmol/L 104   CO2 22 - 32 mmol/L 25   Calcium  8.9 - 10.3 mg/dL 9.4   Total Protein 6.5 - 8.1 g/dL 6.3   Total Bilirubin 0.0 - 1.2 mg/dL 1.9   Alkaline Phos 38 - 126 U/L 54   AST 15 - 41 U/L 29   ALT 0 - 44 U/L 22       Latest Ref Rng & Units 01/18/2024   10:58 AM  CBC  WBC 4.0 - 10.5 K/uL 6.2   Hemoglobin 13.0 - 17.0 g/dL 9.2   Hematocrit 60.9 - 52.0 % 28.7   Platelets 150 - 400 K/uL 302       Assessment and plan- Patient is a 78 y.o. 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 Hemoglobin improved, remains above 9 g/dL.  - Administered luspatercept  at the current lowest dose today at 1 mg/kg - Instructed him to return in four weeks for reassessment of hemoglobin and symptoms. - If hemoglobin drops below 9 g/dL or fails to improve, will consider increasing luspatercept  dose or shortening interval to every three weeks. - If hemoglobin remains stable or increases, will continue current regimen.  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 Ferritin stable in 700s-900s for four years -He does have evidence of iron deposition in his liver but given the waxing and waning values over the last 4 years I am holding off on starting iron chelating therapy at this time    H&H and luspatercept  every 4 weeks.  See me in 3 months with CBC with differential CMP ferritin and iron studies   Visit Diagnosis 1. Myelodysplastic syndrome (HCC)   2. High risk medication use      Dr. Annah Skene, MD, MPH [facility] [phone] 01/18/2024 12:49 PM                   [1]  Allergies Allergen Reactions   Amoxicillin-Pot Clavulanate Hives and Nausea And Vomiting  [2]  Current Outpatient Medications:    acetaminophen  (TYLENOL ) 325 MG tablet, Take 650 mg by mouth every 6 (six) hours as needed., Disp: , Rfl:    Calcium  Carb-Cholecalciferol  600-400 MG-UNIT TABS, Take by mouth., Disp: , Rfl:    ibuprofen (ADVIL) 200 MG tablet, Take 200 mg by mouth every 6 (six) hours as needed., Disp: , Rfl:    LUSPATERCEPT -[facility] [place], Inject into the skin every 21 ( twenty-one) days., Disp: , Rfl:    tadalafil  (CIALIS ) 5 MG tablet, Take 1 tablet (5 mg total) by mouth daily. Ok to take additional 5-15mg  as needed 1 hour prior to sexual activity, Disp: 90 tablet, Rfl: 6 No current facility-administered  medications for this visit.  Facility-Administered Medications Ordered in Other Visits:    Darbepoetin Alfa  (ARANESP ) injection 500 mcg, 500 mcg, Subcutaneous, Once, Mry Lamia C, MD  "

## 2024-02-14 ENCOUNTER — Encounter: Payer: Self-pay | Admitting: Oncology

## 2024-02-15 ENCOUNTER — Encounter: Payer: Self-pay | Admitting: Oncology

## 2024-02-15 ENCOUNTER — Inpatient Hospital Stay

## 2024-02-15 ENCOUNTER — Inpatient Hospital Stay: Attending: Nurse Practitioner

## 2024-02-15 ENCOUNTER — Other Ambulatory Visit: Payer: Self-pay | Admitting: Oncology

## 2024-02-15 VITALS — BP 129/70 | HR 106 | Temp 96.4°F | Resp 18 | Wt 259.6 lb

## 2024-02-15 DIAGNOSIS — D461 Refractory anemia with ring sideroblasts: Secondary | ICD-10-CM | POA: Insufficient documentation

## 2024-02-15 DIAGNOSIS — D469 Myelodysplastic syndrome, unspecified: Secondary | ICD-10-CM

## 2024-02-15 LAB — HEMOGLOBIN AND HEMATOCRIT (CANCER CENTER ONLY)
HCT: 27.3 % — ABNORMAL LOW (ref 39.0–52.0)
Hemoglobin: 8.6 g/dL — ABNORMAL LOW (ref 13.0–17.0)

## 2024-02-15 MED ORDER — LUSPATERCEPT-AAMT 75 MG ~~LOC~~ SOLR
150.0000 mg | Freq: Once | SUBCUTANEOUS | Status: AC
  Administered 2024-02-15: 150 mg via SUBCUTANEOUS
  Filled 2024-02-15: qty 3

## 2024-02-15 NOTE — Progress Notes (Signed)
 atient here for lab/Luspatercept  today.  Hgb 8.6 today decreased from 9.2 last month.    Discussed with Dr. Melanee, who increased Luspatercept  dose today.

## 2024-02-25 ENCOUNTER — Encounter: Payer: Self-pay | Admitting: Acute Care

## 2024-02-26 ENCOUNTER — Encounter: Payer: Self-pay | Admitting: Oncology

## 2024-02-29 ENCOUNTER — Other Ambulatory Visit: Payer: Self-pay | Admitting: Oncology

## 2024-02-29 NOTE — Telephone Encounter (Signed)
 He gets luspatercept  q4 weeks and I have changed his rx plan accordingly

## 2024-03-01 ENCOUNTER — Encounter: Payer: Self-pay | Admitting: Oncology

## 2024-03-01 ENCOUNTER — Inpatient Hospital Stay

## 2024-03-01 ENCOUNTER — Inpatient Hospital Stay: Attending: Nurse Practitioner

## 2024-03-01 DIAGNOSIS — D469 Myelodysplastic syndrome, unspecified: Secondary | ICD-10-CM

## 2024-03-01 NOTE — Telephone Encounter (Signed)
 Contacted lab while patient was there; provided new AVS to patient showing revised treatment plan for luspatercept  to every 4 weeks.

## 2024-03-14 ENCOUNTER — Inpatient Hospital Stay

## 2024-03-22 ENCOUNTER — Inpatient Hospital Stay

## 2024-04-11 ENCOUNTER — Inpatient Hospital Stay

## 2024-04-11 ENCOUNTER — Inpatient Hospital Stay: Admitting: Oncology

## 2024-05-03 ENCOUNTER — Ambulatory Visit

## 2024-09-07 ENCOUNTER — Ambulatory Visit: Admitting: Urology

## 2024-09-12 ENCOUNTER — Ambulatory Visit (INDEPENDENT_AMBULATORY_CARE_PROVIDER_SITE_OTHER): Admitting: Vascular Surgery
